# Patient Record
Sex: Male | Born: 1935
Health system: Southern US, Community
[De-identification: ages and names within clinical notes are randomized; demographics above are authoritative.]

## PROBLEM LIST (undated history)

## (undated) DIAGNOSIS — I4891 Unspecified atrial fibrillation: Secondary | ICD-10-CM

## (undated) DIAGNOSIS — E785 Hyperlipidemia, unspecified: Secondary | ICD-10-CM

## (undated) DIAGNOSIS — I10 Essential (primary) hypertension: Secondary | ICD-10-CM

## (undated) DIAGNOSIS — E119 Type 2 diabetes mellitus without complications: Secondary | ICD-10-CM

## (undated) DIAGNOSIS — N189 Chronic kidney disease, unspecified: Secondary | ICD-10-CM

## (undated) DIAGNOSIS — M1990 Unspecified osteoarthritis, unspecified site: Secondary | ICD-10-CM

## (undated) DIAGNOSIS — N400 Enlarged prostate without lower urinary tract symptoms: Secondary | ICD-10-CM

## (undated) HISTORY — PX: OTHER SURGICAL HISTORY: SHX169

## (undated) HISTORY — DX: Chronic kidney disease, unspecified: N18.9

## (undated) HISTORY — PX: KIDNEY SURGERY: SHX687

## (undated) HISTORY — DX: Benign prostatic hyperplasia without lower urinary tract symptoms: N40.0

## (undated) HISTORY — DX: Essential (primary) hypertension: I10

## (undated) HISTORY — DX: Hyperlipidemia, unspecified: E78.5

## (undated) HISTORY — DX: Type 2 diabetes mellitus without complications: E11.9

---

## 2004-12-02 ENCOUNTER — Ambulatory Visit: Payer: Self-pay | Admitting: Ophthalmology

## 2005-03-24 ENCOUNTER — Ambulatory Visit: Payer: Self-pay | Admitting: Ophthalmology

## 2011-11-29 ENCOUNTER — Ambulatory Visit: Payer: Self-pay | Admitting: Gastroenterology

## 2011-11-30 LAB — PATHOLOGY REPORT

## 2012-08-03 ENCOUNTER — Ambulatory Visit: Payer: Self-pay | Admitting: Family Medicine

## 2014-07-18 DIAGNOSIS — L723 Sebaceous cyst: Secondary | ICD-10-CM | POA: Diagnosis not present

## 2014-08-09 ENCOUNTER — Ambulatory Visit: Payer: Self-pay | Admitting: Surgery

## 2014-08-28 ENCOUNTER — Ambulatory Visit (INDEPENDENT_AMBULATORY_CARE_PROVIDER_SITE_OTHER): Payer: Medicare Other | Admitting: Surgery

## 2014-08-28 ENCOUNTER — Encounter: Payer: Self-pay | Admitting: Surgery

## 2014-08-28 VITALS — BP 146/64 | HR 66 | Temp 97.6°F | Ht 71.0 in | Wt 202.0 lb

## 2014-08-28 DIAGNOSIS — L723 Sebaceous cyst: Secondary | ICD-10-CM | POA: Diagnosis not present

## 2014-08-28 NOTE — Patient Instructions (Signed)
Please follow-up in our office in 1 week for nurse visit to remove sutures.

## 2014-08-28 NOTE — Progress Notes (Signed)
Patient ID: [name], male   DOB: 1934-04-26, 80 y.o.   MRN: [id number]  Chief Complaint  Patient presents with  . Other    Sebaceous Cyst-Mid Upper Back     Cody Howard is a 80 y.o. male.  Presents for excision of sebaceous cyst which has been incised and drained at least 5 different times over the last several months. HPI  No past medical history on file. neg medical history.  No past surgical history on file. no surgical history.  No family history on file. no cancer.  Social History History  Substance Use Topics  . Smoking status: Former Smoker    Quit date: 03/08/1978  . Smokeless tobacco: Not on file  . Alcohol Use: 0.0 oz/week    0 Standard drinks or equivalent per week     Comment: Rarely    No Known Allergies  Current Outpatient Prescriptions  Medication Sig Dispense Refill  . ACCU-CHEK AVIVA PLUS test strip Take 1 tablet by mouth daily.    . metFORMIN (GLUCOPHAGE) 500 MG tablet Take 1 tablet by mouth 2 (two) times daily.    . simvastatin (ZOCOR) 80 MG tablet Take 1 tablet by mouth daily.    . tamsulosin (FLOMAX) 0.4 MG CAPS capsule Take 1 capsule by mouth daily.     No current facility-administered medications for this visit.      Review of Systems A 10 point review of systems was asked and was negative except for the following positive findings.  Blood pressure 146/64, pulse 66, temperature 97.6 F (36.4 C), temperature source Oral, height 5\' 11"  (1.803 m), weight 202 lb (91.627 kg).  Physical Exam CONSTITUTIONAL:  Pleasant, well-developed, well-nourished, and in no acute distress. EYES: Pupils equal and reactive to light, Sclera non-icteric EARS, NOSE, MOUTH AND THROAT:  The oropharynx was clear.  Dentition is good repair.  Oral mucosa pink and moist. LYMPH NODES:  Lymph nodes in the neck and axillae were normal RESPIRATORY:  Lungs were clear.  Normal respiratory effort without pathologic use of accessory muscles of  respiration CARDIOVASCULAR: Heart was regular without murmurs.  There were no carotid bruits. GI: The abdomen was soft, nontender, and nondistended. There were no palpable masses. There was no hepatosplenomegaly. There were normal bowel sounds in all quadrants. GU:  Rectal deferred.   MUSCULOSKELETAL:  Normal muscle strength and tone.  No clubbing or cyanosis.   SKIN:  There is a less than 1 cm sebaceous cyst upper mid back no cellulitis. NEUROLOGIC:  Sensation is normal.  Cranial nerves are grossly intact. PSYCH:  Oriented to person, place and time.  Mood and affect are normal.   Assessment    Sebaceous cyst upper back, recurrent episodes of infection    Plan    Plan is for excision of this in the office today. Under sterile technique an informed consent a field block was created elliptical incision along the transverse axis was fashioned over the cyst. Cyst was excised in its entirety down to subcutaneous fat. The wound was then irrigated. This wound was closed in multiple layers of 3-0 Vicryls and 3-0 nylon and 4-0 nylon in the skin. Neosporin sterile dressing was applied patient tolerated procedure well without immediate consultation.  Follow-up in 1 week's time for suture removal.      Sherri Rad 08/28/2014, 2:54 PM

## 2014-09-11 ENCOUNTER — Ambulatory Visit: Payer: Self-pay

## 2014-09-12 ENCOUNTER — Ambulatory Visit: Payer: Medicare Other

## 2014-09-12 VITALS — BP 124/67 | HR 80 | Temp 97.3°F | Ht 70.0 in | Wt 205.6 lb

## 2014-09-12 DIAGNOSIS — Z4802 Encounter for removal of sutures: Secondary | ICD-10-CM

## 2014-09-12 NOTE — Progress Notes (Signed)
Patient seen in office for suture removal post Sebaceous Cyst removal. Patient denies pain, fever, and drainage. Slightly red around sutures.   Explained to patient that this is normal but if redness does not get better after sutures have been removed for 7-10 days or he develops purulent drainage, fever then he needs to get in touch with our office.  Pt verbalizes understanding.  Sutures were removed intact without difficulty.

## 2014-09-30 ENCOUNTER — Telehealth: Payer: Self-pay

## 2014-09-30 ENCOUNTER — Encounter (INDEPENDENT_AMBULATORY_CARE_PROVIDER_SITE_OTHER): Payer: Self-pay

## 2014-09-30 ENCOUNTER — Encounter: Payer: Self-pay | Admitting: Surgery

## 2014-09-30 ENCOUNTER — Other Ambulatory Visit: Payer: Self-pay | Admitting: Family Medicine

## 2014-09-30 ENCOUNTER — Ambulatory Visit (INDEPENDENT_AMBULATORY_CARE_PROVIDER_SITE_OTHER): Payer: Medicare Other | Admitting: Surgery

## 2014-09-30 VITALS — BP 140/69 | HR 76 | Temp 97.4°F | Ht 71.0 in | Wt 205.0 lb

## 2014-09-30 DIAGNOSIS — L723 Sebaceous cyst: Secondary | ICD-10-CM

## 2014-09-30 DIAGNOSIS — E119 Type 2 diabetes mellitus without complications: Secondary | ICD-10-CM

## 2014-09-30 NOTE — Patient Instructions (Signed)
Follow up as needed

## 2014-09-30 NOTE — Progress Notes (Signed)
Outpatient Surgical Follow Up  09/30/2014  Cody Howard is an 80 y.o. male. Who returns with complaints of drainage from his excisional site on his back for a sebaceous cyst.  Chief Complaint  Patient presents with  . Post-op Problem    surgery site still open and draining    HPI: He underwent a cyst excision several weeks ago and developed some wound drainage. He was seen in the office last week and asked follow-up today to rule out any evidence of infection.  Past Medical History  Diagnosis Date  . Diabetes mellitus without complication   . Chronic kidney disease   . Prostate enlargement   . Hyperlipidemia     Past Surgical History  Procedure Laterality Date  . Kidney surgery Right     surgery when 80 years old  . Sebaceous cyst removal Right     located right of spine on upper back    Family History  Problem Relation Age of Onset  . Diabetes Father   . Heart disease Father     Social History:  reports that he quit smoking about 36 years ago. He does not have any smokeless tobacco history on file. He reports that he drinks alcohol. He reports that he does not use illicit drugs.  Allergies: No Known Allergies  Medications reviewed.    ROS    BP 140/69 mmHg  Pulse 76  Temp(Src) 97.4 F (36.3 C) (Oral)  Ht 5\' 11"  (1.803 m)  Wt 205 lb (92.987 kg)  BMI 28.60 kg/m2  Physical Exam     No results found for this or any previous visit (from the past 48 hour(s)). No results found.  Assessment/Plan:  1. Sebaceous cyst I do not see any evidence of advancing infection or evidence for undrained infection. He clearly had a wound separation likely due to the tension on the incision. I would not recommend any further intervention at this time. We will continue to follow him as necessary. Patient and wife were present for the interview and in agreement with this plan.     Dia Crawford III  09/30/2014,negative

## 2014-09-30 NOTE — Telephone Encounter (Signed)
Patient walked in to office in [place] and wanted nurse to look at Cyst on back as he states that it is becoming more painful by the day.  This nurse looked at the Sebaceous cyst area that was excised recently by Dr. Marina Gravel.   The area is slightly red and appears to be filling once again. Area is not currently draining. Pt denies fever. Placed on schedule to see Dr. Pat Patrick in [place] on Monday.  Explained to patient that if over the weekend, the patient develops fever greater than 100.4, pain becomes severe, or he develops Nausea or vomiting, he would need to call Surgeon on call at hospital for further instructions. Number was given to hospital switchboard. Verbalizes understanding.

## 2014-11-27 ENCOUNTER — Other Ambulatory Visit: Payer: Self-pay | Admitting: Family Medicine

## 2014-11-27 DIAGNOSIS — E119 Type 2 diabetes mellitus without complications: Secondary | ICD-10-CM

## 2014-11-29 ENCOUNTER — Ambulatory Visit
Admission: EM | Admit: 2014-11-29 | Discharge: 2014-11-29 | Disposition: A | Discharge status: Home / Self Care | Payer: Medicare Other | Attending: Internal Medicine | Admitting: Internal Medicine

## 2014-11-29 ENCOUNTER — Encounter: Payer: Self-pay | Admitting: Emergency Medicine

## 2014-11-29 DIAGNOSIS — S61412A Laceration without foreign body of left hand, initial encounter: Secondary | ICD-10-CM | POA: Diagnosis not present

## 2014-11-29 MED ORDER — TETANUS-DIPHTH-ACELL PERTUSSIS 5-2.5-18.5 LF-MCG/0.5 IM SUSP
0.5000 mL | Freq: Once | INTRAMUSCULAR | Status: AC
  Administered 2014-11-29: 0.5 mL via INTRAMUSCULAR

## 2014-11-29 MED ORDER — LIDOCAINE HCL (PF) 1 % IJ SOLN
5.0000 mL | Freq: Once | INTRAMUSCULAR | Status: AC
  Administered 2014-11-29: 5 mL

## 2014-11-29 MED ORDER — BACITRACIN ZINC 500 UNIT/GM EX OINT
TOPICAL_OINTMENT | Freq: Once | CUTANEOUS | Status: AC
  Administered 2014-11-29: 18:00:00 via TOPICAL

## 2014-11-29 NOTE — ED Notes (Signed)
Wound dressed and  Aluminum splint applied to

## 2014-11-29 NOTE — ED Notes (Signed)
Pt with a laceration to left hand from metal stripping

## 2014-11-29 NOTE — ED Provider Notes (Signed)
[facility] Emergency Department Provider Note  ____________________________________________  Time seen: Approximately 4:55 PM  I have reviewed the triage vital signs and the nursing notes.   HISTORY  Chief Complaint Laceration   HPI Cody Howard is a 80 y.o. male presents for complaint of laceration. Patient reports that just prior to arrival he was at home he had cut a piece of thin aluminum to cover a gutter. Reports as he was installing a piece of aluminum over the gutter states that the piece of aluminum slipped and then cut his left hand. States that he did not hit his hand on anything. Denies blunt trauma. Denies pain at this time. Reports unsure of last tetanus immunization. Denies blood thinners. Denies tingling or numbness sensation. Denies fall or head injury.    Past Medical History  Diagnosis Date  . Diabetes mellitus without complication   . Chronic kidney disease   . Prostate enlargement   . Hyperlipidemia     Patient Active Problem List   Diagnosis Date Noted  . Sebaceous cyst 08/28/2014    Past Surgical History  Procedure Laterality Date  . Kidney surgery Right     surgery when 80 years old  . Sebaceous cyst removal Right     located right of spine on upper back    Current Outpatient Rx  Name  Route  Sig  Dispense  Refill  . ACCU-CHEK AVIVA PLUS test strip      TEST DAILY   50 each   0     DX- E11.9   . ACCU-CHEK SOFTCLIX LANCETS lancets      USE AS DIRECTED   100 each   1   . metFORMIN (GLUCOPHAGE) 500 MG tablet   Oral   Take 1 tablet by mouth 2 (two) times daily.         . simvastatin (ZOCOR) 80 MG tablet   Oral   Take 1 tablet by mouth daily.         . tamsulosin (FLOMAX) 0.4 MG CAPS capsule   Oral   Take 1 capsule by mouth daily.           Allergies Review of patient's allergies indicates no known allergies.  Family History  Problem Relation Age of Onset  . Diabetes Father   . Heart disease  Father     Social History Social History  Substance Use Topics  . Smoking status: Former Smoker    Quit date: 03/08/1978  . Smokeless tobacco: None  . Alcohol Use: 0.0 oz/week    0 Standard drinks or equivalent per week     Comment: Rarely    Review of Systems Constitutional: No fever/chills Eyes: No visual changes. ENT: No sore throat. Cardiovascular: Denies chest pain. Respiratory: Denies shortness of breath. Gastrointestinal: No abdominal pain.  No nausea, no vomiting.  No diarrhea.  No constipation. Genitourinary: Negative for dysuria. Musculoskeletal: Negative for back pain. Skin: Negative for rash.laceration as above  Neurological: Negative for headaches, focal weakness or numbness.  10-point ROS otherwise negative.  ____________________________________________   PHYSICAL EXAM:  VITAL SIGNS: ED Triage Vitals  Enc Vitals Group     BP 11/29/14 1652 169/71 mmHg     Pulse Rate 11/29/14 1652 74     Resp 11/29/14 1652 20     Temp 11/29/14 1652 98.6 F (37 C)     Temp Source 11/29/14 1652 Tympanic     SpO2 11/29/14 1652 99 %     Weight 11/29/14  1652 195 lb (88.451 kg)     Height 11/29/14 1652 5\' 11"  (1.803 m)     Head Cir --      Peak Flow --      Pain Score 11/29/14 1653 4     Pain Loc --      Pain Edu? --      Excl. in [place]? --     Constitutional: Alert and oriented. Well appearing and in no acute distress. Eyes: Conjunctivae are normal. PERRL. EOMI. Head: Atraumatic.  Nose: No congestion/rhinnorhea.  Mouth/Throat: Mucous membranes are moist.   Neck: No stridor.  No cervical spine tenderness to palpation. Cardiovascular: Normal rate, regular rhythm. Grossly normal heart sounds.  Good peripheral circulation. Respiratory: Normal respiratory effort.  No retractions. Lungs CTAB. Gastrointestinal: Soft and nontender. No distention. Normal Bowel sounds.   Musculoskeletal: No lower or upper extremity tenderness nor edema.  No joint effusions. Bilateral pedal  pulses equal and easily palpated.  See skin notes below. Left hand palmer aspect with lacerations present, nontender, no tendon deficit, no foreign bodies visualized, cap refil <2 secs to all fingers. Bilateral hand grips equal.  Neurologic:  Normal speech and language. No gross focal neurologic deficits are appreciated. No gait instability. Skin:  Skin is warm, dry and intact. No rash noted. Except: Left hand palmar aspect at the base of fifth MCP 3.5 cm laceration present no tendon visualized, full range of motion, no tendon deficit, sensation intact, nontender. Left hand palmer aspect base of 4th finger just below MCP, 1.5 cm laceration, no tendon deficit, sensation intact, no tendon visualized. Left palmar aspect distal thumb superficial 2 cm laceration present, full range of motion, no tendon deficit, sensation intact and nontender.No foreign bodies visualized.   Skin otherwise intact. Psychiatric: Mood and affect are normal. Speech and behavior are normal.  ____________________________________________   LABS (all labs ordered are listed, but only abnormal results are displayed)  Labs Reviewed - No data to display   PROCEDURES  Procedure(s) performed:  Procedure explained and verbal consent obtained. Consent: Verbal consent obtained. Written consent not obtained. Risks and benefits: risks, benefits and alternatives were discussed Patient identity confirmed: verbally with patient and hospital-assigned identification number  Consent given by: patient   Laceration Repair Location: left hand at base of 5th MCP palmer aspect Length: 3.5 cm Foreign bodies: no foreign bodies Tendon involvement: none Nerve involvement: none Preparation: Patient was prepped and draped in the usual sterile fashion. Anesthesia with 1% Lidocaine 3 mls Irrigation solution: saline Irrigation method: jet lavage Amount of cleaning: copious Repaired with 5-0 sutures Number of sutures: 6 Technique: simple  interrupted  Approximation: loose  Laceration Repair Location: left hand palmer aspect thumb Length: 2cm Foreign bodies: no foreign bodies Tendon involvement: none Nerve involvement: none Preparation: Patient was prepped and draped in the usual sterile fashion. Anesthesia with 1% Lidocaine 1 mls Irrigation solution: saline Irrigation method: jet lavage Amount of cleaning: copious Repaired with 5-0 nylon Number of sutures: 2 Technique: simple interrupted  Approximation: loose  Laceration Repair Location: left hand palmer base of 4th mcp Length: 1.5 cm Foreign bodies: no foreign bodies Tendon involvement: none Nerve involvement: none Preparation: Patient was prepped and draped in the usual sterile fashion. Anesthesia with 1% Lidocaine 1 mls Irrigation solution: saline Irrigation method: jet lavage Amount of cleaning: copious Repaired with 5-0 nylon Number of sutures: 2 Technique: simple interrupted  Approximation: loose Patient tolerate well. Wound well approximated post repair.  Antibiotic ointment and dressing applied.  Wound  care instructions provided.  Observe for any signs of infection or other problems.    Also finger splint applied over 4/5 fingers to limit flexion.   _________________________________   INITIAL IMPRESSION / ASSESSMENT AND PLAN / ED COURSE  Pertinent labs & imaging results that were available during my care of the patient were reviewed by me and considered in my medical decision making (see chart for details).   Well-appearing patient. No acute distress. Presents with multiple lacerations to left palmar aspect of hand. Lacerations repaired. Patient tolerated well. No foreign body seen. Tetanus immunization updated. Discussed with patient to return in 2 days for wound check. Return 7-10 days for suture removal.  Discussed cleaning at home.Discussed follow up with Primary care physician this week. Discussed follow up and return parameters including no  resolution or any worsening concerns. Patient verbalized understanding and agreed to plan.   ____________________________________________   FINAL CLINICAL IMPRESSION(S) / ED DIAGNOSES  Final diagnoses:  Hand laceration, left, initial encounter       Marylene Land, NP 11/29/14 1758  Marylene Land, NP 11/29/14 1759

## 2014-11-29 NOTE — Discharge Instructions (Signed)
Keep area clean and dry. Clean daily with soap and water, rinse, pat dry, then apply thin layer of topical antibiotic. Allow some open to air time daily when in clean environment. Wear splint for 2-3 days to allow for healing.   Return in 2 days for wound check as discussed.   Return in 7-10 days for suture removal. Return sooner for redness, drainage, swelling, pain, new or worsening concerns.   Laceration Care, Adult A laceration is a cut that goes through all layers of the skin. The cut goes into the tissue beneath the skin. HOME CARE For stitches (sutures) or staples:  Keep the cut clean and dry.  If you have a bandage (dressing), change it at least once a day. Change the bandage if it gets wet or dirty, or as told by your doctor.  Wash the cut with soap and water 2 times a day. Rinse the cut with water. Pat it dry with a clean towel.  Put a thin layer of medicated cream on the cut as told by your doctor.  You may shower after the first 24 hours. Do not soak the cut in water until the stitches are removed.  Only take medicines as told by your doctor.  Have your stitches or staples removed as told by your doctor. For skin adhesive strips:  Keep the cut clean and dry.  Do not get the strips wet. You may take a bath, but be careful to keep the cut dry.  If the cut gets wet, pat it dry with a clean towel.  The strips will fall off on their own. Do not remove the strips that are still stuck to the cut. For wound glue:  You may shower or take baths. Do not soak or scrub the cut. Do not swim. Avoid heavy sweating until the glue falls off on its own. After a shower or bath, pat the cut dry with a clean towel.  Do not put medicine on your cut until the glue falls off.  If you have a bandage, do not put tape over the glue.  Avoid lots of sunlight or tanning lamps until the glue falls off. Put sunscreen on the cut for the first year to reduce your scar.  The glue will fall off on  its own. Do not pick at the glue. You may need a tetanus shot if:  You cannot remember when you had your last tetanus shot.  You have never had a tetanus shot. If you need a tetanus shot and you choose not to have one, you may get tetanus. Sickness from tetanus can be serious. GET HELP RIGHT AWAY IF:   Your pain does not get better with medicine.  Your arm, hand, leg, or foot loses feeling (numbness) or changes color.  Your cut is bleeding.  Your joint feels weak, or you cannot use your joint.  You have painful lumps on your body.  Your cut is red, puffy (swollen), or painful.  You have a red line on the skin near the cut.  You have yellowish-white fluid (pus) coming from the cut.  You have a fever.  You have a bad smell coming from the cut or bandage.  Your cut breaks open before or after stitches are removed.  You notice something coming out of the cut, such as wood or glass.  You cannot move a finger or toe. MAKE SURE YOU:   Understand these instructions.  Will watch your condition.  Will get help  right away if you are not doing well or get worse. Document Released: 08/11/2007 Document Revised: 05/17/2011 Document Reviewed: 08/18/2010 [facility] Patient Information 2015 [place], [place]. This information is not intended to replace advice given to you by your health care provider. Make sure you discuss any questions you have with your health care provider.  Laceration Care, Adult A laceration is a cut or lesion that goes through all layers of the skin and into the tissue just beneath the skin. TREATMENT  Some lacerations may not require closure. Some lacerations may not be able to be closed due to an increased risk of infection. It is important to see your caregiver as soon as possible after an injury to minimize the risk of infection and maximize the opportunity for successful closure. If closure is appropriate, pain medicines may be given, if needed. The wound will be  cleaned to help prevent infection. Your caregiver will use stitches (sutures), staples, wound glue (adhesive), or skin adhesive strips to repair the laceration. These tools bring the skin edges together to allow for faster healing and a better cosmetic outcome. However, all wounds will heal with a scar. Once the wound has healed, scarring can be minimized by covering the wound with sunscreen during the day for 1 full year. HOME CARE INSTRUCTIONS  For sutures or staples:  Keep the wound clean and dry.  If you were given a bandage (dressing), you should change it at least once a day. Also, change the dressing if it becomes wet or dirty, or as directed by your caregiver.  Wash the wound with soap and water 2 times a day. Rinse the wound off with water to remove all soap. Pat the wound dry with a clean towel.  After cleaning, apply a thin layer of the antibiotic ointment as recommended by your caregiver. This will help prevent infection and keep the dressing from sticking.  You may shower as usual after the first 24 hours. Do not soak the wound in water until the sutures are removed.  Only take over-the-counter or prescription medicines for pain, discomfort, or fever as directed by your caregiver.  Get your sutures or staples removed as directed by your caregiver. For skin adhesive strips:  Keep the wound clean and dry.  Do not get the skin adhesive strips wet. You may bathe carefully, using caution to keep the wound dry.  If the wound gets wet, pat it dry with a clean towel.  Skin adhesive strips will fall off on their own. You may trim the strips as the wound heals. Do not remove skin adhesive strips that are still stuck to the wound. They will fall off in time. For wound adhesive:  You may briefly wet your wound in the shower or bath. Do not soak or scrub the wound. Do not swim. Avoid periods of heavy perspiration until the skin adhesive has fallen off on its own. After showering or  bathing, gently pat the wound dry with a clean towel.  Do not apply liquid medicine, cream medicine, or ointment medicine to your wound while the skin adhesive is in place. This may loosen the film before your wound is healed.  If a dressing is placed over the wound, be careful not to apply tape directly over the skin adhesive. This may cause the adhesive to be pulled off before the wound is healed.  Avoid prolonged exposure to sunlight or tanning lamps while the skin adhesive is in place. Exposure to ultraviolet light in the first  year will darken the scar.  The skin adhesive will usually remain in place for 5 to 10 days, then naturally fall off the skin. Do not pick at the adhesive film. You may need a tetanus shot if:  You cannot remember when you had your last tetanus shot.  You have never had a tetanus shot. If you get a tetanus shot, your arm may swell, get red, and feel warm to the touch. This is common and not a problem. If you need a tetanus shot and you choose not to have one, there is a rare chance of getting tetanus. Sickness from tetanus can be serious. SEEK MEDICAL CARE IF:   You have redness, swelling, or increasing pain in the wound.  You see a red line that goes away from the wound.  You have yellowish-white fluid (pus) coming from the wound.  You have a fever.  You notice a bad smell coming from the wound or dressing.  Your wound breaks open before or after sutures have been removed.  You notice something coming out of the wound such as wood or glass.  Your wound is on your hand or foot and you cannot move a finger or toe. SEEK IMMEDIATE MEDICAL CARE IF:   Your pain is not controlled with prescribed medicine.  You have severe swelling around the wound causing pain and numbness or a change in color in your arm, hand, leg, or foot.  Your wound splits open and starts bleeding.  You have worsening numbness, weakness, or loss of function of any joint around or  beyond the wound.  You develop painful lumps near the wound or on the skin anywhere on your body. MAKE SURE YOU:   Understand these instructions.  Will watch your condition.  Will get help right away if you are not doing well or get worse. Document Released: 02/22/2005 Document Revised: 05/17/2011 Document Reviewed: 08/18/2010 [facility] Patient Information 2015 [place], [place]. This information is not intended to replace advice given to you by your health care provider. Make sure you discuss any questions you have with your health care provider.

## 2014-12-01 ENCOUNTER — Ambulatory Visit
Admission: EM | Admit: 2014-12-01 | Discharge: 2014-12-01 | Disposition: A | Discharge status: Home / Self Care | Payer: Medicare Other | Attending: Internal Medicine | Admitting: Internal Medicine

## 2014-12-01 ENCOUNTER — Encounter: Payer: Self-pay | Admitting: *Deleted

## 2014-12-01 DIAGNOSIS — Z4801 Encounter for change or removal of surgical wound dressing: Secondary | ICD-10-CM

## 2014-12-01 DIAGNOSIS — IMO0002 Reserved for concepts with insufficient information to code with codable children: Secondary | ICD-10-CM

## 2014-12-01 NOTE — ED Notes (Signed)
Pt is here today for a follow up on injury to left hand, sutures were put in on Friday.

## 2014-12-01 NOTE — Discharge Instructions (Signed)
Keep clean. Clean daily with soap and water, rinse, pat dry, then apply thin layer topical antibiotic ointment such as neosporin. Allow some open to air time daily.   Return in 7 days for suture removal. Return sooner for redness, swelling, pain, new or worsening concerns.

## 2014-12-01 NOTE — ED Provider Notes (Signed)
[facility] Emergency Department Provider Note  ____________________________________________  Time seen: Approximately 9:27 AM  I have reviewed the triage vital signs and the nursing notes.   HISTORY  Chief Complaint Follow-up   HPI Cody Howard is a 80 y.o. male presents today for the complaint of wound check. Patient was seen 2 days ago at urgent care for laceration repair. Presents today for recheck of laceration. Patient denies pain. Reports occasional tenderness with movement but denies any at this time. Denies redness, drainage, numbness, tendon sensation or decrease hand grips.   Past Medical History  Diagnosis Date  . Diabetes mellitus without complication   . Chronic kidney disease   . Prostate enlargement   . Hyperlipidemia     Patient Active Problem List   Diagnosis Date Noted  . Sebaceous cyst 08/28/2014    Past Surgical History  Procedure Laterality Date  . Kidney surgery Right     surgery when 80 years old  . Sebaceous cyst removal Right     located right of spine on upper back    Current Outpatient Rx  Name  Route  Sig  Dispense  Refill  . ACCU-CHEK AVIVA PLUS test strip      TEST DAILY   50 each   0     DX- E11.9   . ACCU-CHEK SOFTCLIX LANCETS lancets      USE AS DIRECTED   100 each   1   . metFORMIN (GLUCOPHAGE) 500 MG tablet   Oral   Take 1 tablet by mouth 2 (two) times daily.         . simvastatin (ZOCOR) 80 MG tablet   Oral   Take 1 tablet by mouth daily.         . tamsulosin (FLOMAX) 0.4 MG CAPS capsule   Oral   Take 1 capsule by mouth daily.           Allergies Review of patient's allergies indicates no known allergies.  Family History  Problem Relation Age of Onset  . Diabetes Father   . Heart disease Father     Social History Social History  Substance Use Topics  . Smoking status: Former Smoker    Quit date: 03/08/1978  . Smokeless tobacco: None  . Alcohol Use: 0.0 oz/week    0  Standard drinks or equivalent per week     Comment: Rarely   Review of Systems Constitutional: No fever/chills Eyes: No visual changes. ENT: No sore throat. Cardiovascular: Denies chest pain. Respiratory: Denies shortness of breath. Gastrointestinal: No abdominal pain.  No nausea, no vomiting.  No diarrhea.  No constipation. Genitourinary: Negative for dysuria. Musculoskeletal: Negative for back pain. Skin: Negative for rash.laceration left hand.  Neurological: Negative for headaches, focal weakness or numbness.  10-point ROS otherwise negative.  ____________________________________________   PHYSICAL EXAM:  VITAL SIGNS: ED Triage Vitals  Enc Vitals Group     BP 12/01/14 0851 146/59 mmHg     Pulse Rate 12/01/14 0851 63     Resp -- 18     Temp 12/01/14 0851 97.8 F (36.6 C)     Temp Source 12/01/14 0851 Oral     SpO2 12/01/14 0851 98 %     Weight 12/01/14 0851 195 lb (88.451 kg)     Height 12/01/14 0851 5\' 11"  (1.803 m)     Head Cir --      Peak Flow --      Pain Score 12/01/14 0855 3  Pain Loc --      Pain Edu? --      Excl. in [place]? --     Constitutional: Alert and oriented. Well appearing and in no acute distress. Eyes: Conjunctivae are normal. PERRL. EOMI. Head: Atraumatic.  Nose: No congestion/rhinnorhea.  Mouth/Throat: Mucous membranes are moist.   Cardiovascular: Normal rate, regular rhythm. Grossly normal heart sounds.  Good peripheral circulation. Respiratory: Normal respiratory effort.  No retractions. Lungs CTAB. Gastrointestinal: Soft and nontender. No distention. Normal Bowel sounds.  Musculoskeletal: No lower or upper extremity tenderness nor edema.  Neurologic:  Normal speech and language. No gross focal neurologic deficits are appreciated. No gait instability. Skin:  Skin is warm, dry and intact. No rash noted. Except: Left palmer aspect base of 4 mcp 2 sutures present, base of 5th mcp 6 sutures present and distal thumb 2 sutures present. Wounds  well approximated, no erythema , no drainage, full ROM, cap refill <2 secs, no tendon or motor deficit.  Psychiatric: Mood and affect are normal. Speech and behavior are normal.  _________________________________________   INITIAL IMPRESSION / ASSESSMENT AND PLAN / ED COURSE  Pertinent labs & imaging results that were available during my care of the patient were reviewed by me and considered in my medical decision making (see chart for details).  Patient presents for laceration recheck. The wound is well healed without signs of infection. Wound care and activity instructions given. Wound cleaned and irrigated with saline and betadine. Antibiotic ointment and dressing applied.Counseled clean and dress daily. Return in 7 days for suture removal. Discussed follow up with Primary care physician this week. Discussed follow up and return parameters including no resolution or any worsening concerns. Patient verbalized understanding and agreed to plan.    ____________________________________________   FINAL CLINICAL IMPRESSION(S) / ED DIAGNOSES  Final diagnoses:  Laceration re-check       Marylene Land, NP 12/01/14 [id number]  Marylene Land, NP 12/01/14 1032

## 2014-12-05 ENCOUNTER — Other Ambulatory Visit: Payer: Self-pay

## 2014-12-08 ENCOUNTER — Ambulatory Visit
Admission: EM | Admit: 2014-12-08 | Discharge: 2014-12-08 | Disposition: A | Discharge status: Home / Self Care | Payer: Medicare Other

## 2014-12-08 NOTE — ED Notes (Signed)
Suture removal of left pinky and thumb.  Edges well approximated, no redness or drainage noted.  Able to bend pinky and thumb without difficulty.

## 2014-12-10 ENCOUNTER — Other Ambulatory Visit: Payer: Self-pay | Admitting: Family Medicine

## 2015-02-10 ENCOUNTER — Ambulatory Visit (INDEPENDENT_AMBULATORY_CARE_PROVIDER_SITE_OTHER): Payer: Medicare Other | Admitting: Family Medicine

## 2015-02-10 ENCOUNTER — Encounter: Payer: Self-pay | Admitting: Family Medicine

## 2015-02-10 VITALS — BP 122/60 | HR 54 | Ht 70.0 in | Wt 202.0 lb

## 2015-02-10 DIAGNOSIS — R1310 Dysphagia, unspecified: Secondary | ICD-10-CM | POA: Diagnosis not present

## 2015-02-10 DIAGNOSIS — E1129 Type 2 diabetes mellitus with other diabetic kidney complication: Secondary | ICD-10-CM | POA: Insufficient documentation

## 2015-02-10 DIAGNOSIS — Z0000 Encounter for general adult medical examination without abnormal findings: Secondary | ICD-10-CM | POA: Diagnosis not present

## 2015-02-10 DIAGNOSIS — Z87438 Personal history of other diseases of male genital organs: Secondary | ICD-10-CM

## 2015-02-10 DIAGNOSIS — E119 Type 2 diabetes mellitus without complications: Secondary | ICD-10-CM | POA: Diagnosis not present

## 2015-02-10 DIAGNOSIS — E785 Hyperlipidemia, unspecified: Secondary | ICD-10-CM

## 2015-02-10 LAB — HEMOCCULT GUIAC POC 1CARD (OFFICE): FECAL OCCULT BLD: NEGATIVE

## 2015-02-10 NOTE — Progress Notes (Signed)
Patient: Cody Howard, Male    DOB: 02-Sep-1934, 80 y.o.   MRN: [id number] Visit Date: 02/10/2015  Today's Provider: Otilio Miu, MD   Chief Complaint  Patient presents with  . Annual Exam    medicare annual wellness  . Hand Pain    arthritis in index finger of R) hand   Subjective:   Initial preventative physical exam Cody Howard is a 80 y.o. male who presents today for his Initial Preventative Physical Exam. He feels well. He reports exercising regular. He reports he is sleeping well.  Hyperlipidemia This is a chronic problem. The current episode started more than 1 year ago. The problem is controlled. Recent lipid tests were reviewed and are normal. Exacerbating diseases include diabetes and obesity. He has no history of chronic renal disease. There are no known factors aggravating his hyperlipidemia. Pertinent negatives include no chest pain, focal sensory loss, focal weakness, leg pain, myalgias or shortness of breath. He is currently on no antihyperlipidemic treatment. The current treatment provides mild improvement of lipids. There are no compliance problems.  There are no known risk factors for coronary artery disease.  Diabetes He presents for his follow-up diabetic visit. He has type 2 diabetes mellitus. There are no hypoglycemic associated symptoms. Pertinent negatives for hypoglycemia include no confusion, dizziness, headaches, nervousness/anxiousness, seizures, speech difficulty or tremors. Pertinent negatives for diabetes include no chest pain, no fatigue, no polydipsia, no polyphagia, no polyuria and no weakness. There are no hypoglycemic complications. Symptoms are stable. There are no diabetic complications. Current diabetic treatment includes oral agent (monotherapy). He is compliant with treatment all of the time.    Review of Systems  Constitutional: Negative for fever, chills, appetite change, fatigue and unexpected weight change.  HENT: Negative for ear pain,  facial swelling, hearing loss, nosebleeds, sneezing, sore throat and trouble swallowing.   Eyes: Negative for photophobia, pain, discharge, redness, itching and visual disturbance.  Respiratory: Negative for cough, choking, chest tightness, shortness of breath and wheezing.   Cardiovascular: Negative for chest pain, palpitations and leg swelling.  Gastrointestinal: Negative for nausea, vomiting, abdominal pain, diarrhea, constipation, blood in stool and rectal pain.       Dysphagia  Endocrine: Negative for cold intolerance, heat intolerance, polydipsia, polyphagia and polyuria.  Genitourinary: Negative for dysuria, urgency, frequency, hematuria, flank pain, decreased urine volume, discharge, penile swelling, scrotal swelling, difficulty urinating, penile pain and testicular pain.  Musculoskeletal: Negative for myalgias, back pain, joint swelling, neck pain and neck stiffness.  Skin: Negative for color change and rash.  Allergic/Immunologic: Negative for immunocompromised state.  Neurological: Negative for dizziness, tremors, focal weakness, seizures, syncope, speech difficulty, weakness, light-headedness, numbness and headaches.  Hematological: Bruises/bleeds easily.  Psychiatric/Behavioral: Negative for suicidal ideas, hallucinations, behavioral problems, confusion, self-injury, dysphoric mood and agitation. The patient is not nervous/anxious.     Social History   Social History  . Marital Status: Married    Spouse Name: N/A  . Number of Children: N/A  . Years of Education: N/A   Occupational History  . Not on file.   Social History Main Topics  . Smoking status: Former Smoker    Quit date: 03/08/1978  . Smokeless tobacco: Not on file  . Alcohol Use: 0.0 oz/week    0 Standard drinks or equivalent per week     Comment: Rarely  . Drug Use: No  . Sexual Activity: Not Currently   Other Topics Concern  . Not on file   Social History Narrative  Patient Active Problem List    Diagnosis Date Noted  . Type 2 diabetes mellitus without complication, without long-term current use of insulin ([place]) 02/10/2015  . History of BPH 02/10/2015  . Sebaceous cyst 08/28/2014    Past Surgical History  Procedure Laterality Date  . Kidney surgery Right     surgery when 80 years old  . Sebaceous cyst removal Right     located right of spine on upper back    His family history includes Diabetes in his father; Heart disease in his father.    Previous Medications   ACCU-CHEK AVIVA PLUS TEST STRIP    TEST DAILY   ACCU-CHEK SOFTCLIX LANCETS LANCETS    USE AS DIRECTED   METFORMIN (GLUCOPHAGE) 500 MG TABLET    Take 1 tablet by mouth two  times daily   OMEGA-3 FATTY ACIDS (FISH OIL) 1000 MG CAPS    Take 1 capsule by mouth daily.   SIMVASTATIN (ZOCOR) 80 MG TABLET    Take 1 tablet by mouth  every day   TAMSULOSIN (FLOMAX) 0.4 MG CAPS CAPSULE    Take 1 capsule by mouth  every day    Patient Care Team: [name], MD as PCP - General (Family Medicine)     Objective:   Vitals: BP 122/60 mmHg  Pulse 54  Ht 5\' 10"  (1.778 m)  Wt 202 lb (91.627 kg)  BMI 28.98 kg/m2  Physical Exam  Constitutional: He is oriented to person, place, and time. He appears well-developed and well-nourished.  HENT:  Head: Normocephalic.  Right Ear: External ear normal.  Left Ear: External ear normal.  Nose: Nose normal.  Mouth/Throat: Oropharynx is clear and moist.  Eyes: Conjunctivae and EOM are normal. Pupils are equal, round, and reactive to light.  Neck: Normal range of motion. Neck supple.  Cardiovascular: Normal rate, regular rhythm, normal heart sounds and intact distal pulses.   Pulmonary/Chest: Effort normal and breath sounds normal.  Abdominal: Soft. Bowel sounds are normal.  Genitourinary: Rectum normal, prostate normal, testes normal and penis normal. Guaiac negative stool.  Musculoskeletal: Normal range of motion.  Neurological: He is alert and oriented to person, place, and time.  He has normal reflexes.  Skin: Skin is warm and dry.  Psychiatric: He has a normal mood and affect. His behavior is normal. Judgment and thought content normal.     No exam data present  Activities of Daily Living In your present state of health, do you have any difficulty performing the following activities: 02/10/2015  Hearing? N  Vision? N  Difficulty concentrating or making decisions? N  Walking or climbing stairs? N  Dressing or bathing? N  Doing errands, shopping? N    Fall Risk Assessment Fall Risk  02/10/2015  Falls in the past year? No     Patient reports there are not safety devices in place in shower at home.   Depression Screen PHQ 2/9 Scores 02/10/2015  PHQ - 2 Score 1    Cognitive Testing - 6-CIT   Correct? Score   What year is it? yes 0 Yes = 0    No = 4  What month is it? yes 0 Yes = 0    No = 3  Remember:     Pia [address]au, [place], [place]     What time is it? yes 0 Yes = 0    No = 3  Count backwards from 20 to 1 yes 0 Correct = 0  1 error = 2   More than 1 error = 4  Say the months of the year in reverse. yes 0 Correct = 0    1 error = 2   More than 1 error = 4  What address did I ask you to remember? yes 2 Correct = 0  1 error = 2    2 error = 4    3 error = 6    4 error = 8    All wrong = 10       TOTAL SCORE  2/28   Interpretation:  Normal  Normal (0-7) Abnormal (8-28)     Assessment & Plan:     Initial Preventative Physical Exam  Reviewed patient's Family Medical History Reviewed and updated list of patient's medical providers Assessment of cognitive impairment was done Assessed patient's functional ability Established a written schedule for health screening [place] Completed and Reviewed  Exercise Activities and Dietary recommendations Goals    None      Immunization History  Administered Date(s) Administered  . Pneumococcal Polysaccharide-23 07/31/2010  . Tdap 11/29/2014    Health Maintenance   Topic Date Due  . HEMOGLOBIN A1C  [birth year]  . FOOT EXAM  01/23/1947  . OPHTHALMOLOGY EXAM  01/23/1947  . URINE MICROALBUMIN  01/23/1947  . ZOSTAVAX  01/22/1997  . PNA vac Low Risk Adult (1 of 2 - PCV13) 01/22/2002  . INFLUENZA VACCINE  10/07/2014  . TETANUS/TDAP  11/28/2024   influenza    Discussed health benefits of physical activity, and encouraged him to engage in regular exercise appropriate for his age and condition.    ------------------------------------------------------------------------------------------------------------   Problem List Items Addressed This Visit      Endocrine   Type 2 diabetes mellitus without complication, without long-term current use of insulin ([place])   Relevant Orders   HgB A1c   Renal Function Panel     Other   History of BPH   Relevant Orders   PSA    Other Visit Diagnoses    Medicare annual wellness visit, subsequent    -  Primary    Relevant Orders    POCT Occult Blood Stool (Completed)    Hyperlipidemia        Relevant Orders    Lipid Profile    Dysphagia        Relevant Orders    Ambulatory referral to Gastroenterology        Otilio Miu, MD [facility]  02/10/2015

## 2015-02-11 LAB — RENAL FUNCTION PANEL
ALBUMIN: 4.3 g/dL (ref 3.5–4.8)
BUN/Creatinine Ratio: 15 (ref 10–22)
BUN: 21 mg/dL (ref 8–27)
CHLORIDE: 103 mmol/L (ref 97–106)
CO2: 21 mmol/L (ref 18–29)
Calcium: 9.2 mg/dL (ref 8.6–10.2)
Creatinine, Ser: 1.43 mg/dL — ABNORMAL HIGH (ref 0.76–1.27)
GFR calc non Af Amer: 47 mL/min/{1.73_m2} — ABNORMAL LOW (ref >59)
GFR, EST AFRICAN AMERICAN: 54 mL/min/{1.73_m2} — AB (ref >59)
GLUCOSE: 147 mg/dL — AB (ref 65–99)
POTASSIUM: 5 mmol/L (ref 3.5–5.2)
Phosphorus: 2.6 mg/dL (ref 2.5–4.5)
Sodium: 143 mmol/L (ref 136–144)

## 2015-02-11 LAB — LIPID PANEL
Chol/HDL Ratio: 2.8 ratio units (ref 0.0–5.0)
Cholesterol, Total: 152 mg/dL (ref 100–199)
HDL: 55 mg/dL (ref >39)
LDL Calculated: 70 mg/dL (ref 0–99)
Triglycerides: 135 mg/dL (ref 0–149)
VLDL Cholesterol Cal: 27 mg/dL (ref 5–40)

## 2015-02-11 LAB — HEMOGLOBIN A1C
Est. average glucose Bld gHb Est-mCnc: 166 mg/dL
Hgb A1c MFr Bld: 7.4 % — ABNORMAL HIGH (ref 4.8–5.6)

## 2015-02-11 LAB — PSA: PROSTATE SPECIFIC AG, SERUM: 0.4 ng/mL (ref 0.0–4.0)

## 2015-02-13 DIAGNOSIS — R195 Other fecal abnormalities: Secondary | ICD-10-CM | POA: Diagnosis not present

## 2015-02-13 DIAGNOSIS — R1310 Dysphagia, unspecified: Secondary | ICD-10-CM | POA: Diagnosis not present

## 2015-02-20 ENCOUNTER — Encounter: Payer: Self-pay | Admitting: *Deleted

## 2015-02-25 NOTE — Discharge Instructions (Signed)

## 2015-02-26 ENCOUNTER — Ambulatory Visit: Payer: Medicare Other | Admitting: Anesthesiology

## 2015-02-26 ENCOUNTER — Encounter
Admission: RE | Disposition: A | Discharge status: Home / Self Care | Payer: Self-pay | Source: Ambulatory Visit | Attending: Gastroenterology

## 2015-02-26 ENCOUNTER — Ambulatory Visit
Admission: RE | Admit: 2015-02-26 | Discharge: 2015-02-26 | Disposition: A | Discharge status: Home / Self Care | Payer: Medicare Other | Source: Ambulatory Visit | Attending: Gastroenterology | Admitting: Gastroenterology

## 2015-02-26 DIAGNOSIS — E7800 Pure hypercholesterolemia, unspecified: Secondary | ICD-10-CM | POA: Insufficient documentation

## 2015-02-26 DIAGNOSIS — F458 Other somatoform disorders: Secondary | ICD-10-CM | POA: Diagnosis not present

## 2015-02-26 DIAGNOSIS — K5730 Diverticulosis of large intestine without perforation or abscess without bleeding: Secondary | ICD-10-CM | POA: Insufficient documentation

## 2015-02-26 DIAGNOSIS — D125 Benign neoplasm of sigmoid colon: Secondary | ICD-10-CM | POA: Insufficient documentation

## 2015-02-26 DIAGNOSIS — K635 Polyp of colon: Secondary | ICD-10-CM | POA: Insufficient documentation

## 2015-02-26 DIAGNOSIS — D122 Benign neoplasm of ascending colon: Secondary | ICD-10-CM | POA: Diagnosis not present

## 2015-02-26 DIAGNOSIS — N189 Chronic kidney disease, unspecified: Secondary | ICD-10-CM | POA: Diagnosis not present

## 2015-02-26 DIAGNOSIS — Z7984 Long term (current) use of oral hypoglycemic drugs: Secondary | ICD-10-CM | POA: Diagnosis not present

## 2015-02-26 DIAGNOSIS — Z86010 Personal history of colonic polyps: Secondary | ICD-10-CM | POA: Diagnosis not present

## 2015-02-26 DIAGNOSIS — R1310 Dysphagia, unspecified: Secondary | ICD-10-CM | POA: Diagnosis not present

## 2015-02-26 DIAGNOSIS — Z87891 Personal history of nicotine dependence: Secondary | ICD-10-CM | POA: Insufficient documentation

## 2015-02-26 DIAGNOSIS — Z800 Family history of malignant neoplasm of digestive organs: Secondary | ICD-10-CM | POA: Insufficient documentation

## 2015-02-26 DIAGNOSIS — Z79899 Other long term (current) drug therapy: Secondary | ICD-10-CM | POA: Insufficient documentation

## 2015-02-26 DIAGNOSIS — E118 Type 2 diabetes mellitus with unspecified complications: Secondary | ICD-10-CM | POA: Insufficient documentation

## 2015-02-26 DIAGNOSIS — K921 Melena: Secondary | ICD-10-CM | POA: Diagnosis not present

## 2015-02-26 DIAGNOSIS — R195 Other fecal abnormalities: Secondary | ICD-10-CM | POA: Diagnosis not present

## 2015-02-26 DIAGNOSIS — D123 Benign neoplasm of transverse colon: Secondary | ICD-10-CM | POA: Diagnosis not present

## 2015-02-26 HISTORY — DX: Unspecified osteoarthritis, unspecified site: M19.90

## 2015-02-26 HISTORY — PX: COLONOSCOPY: SHX5424

## 2015-02-26 HISTORY — PX: POLYPECTOMY: SHX5525

## 2015-02-26 HISTORY — PX: ESOPHAGOGASTRODUODENOSCOPY: SHX5428

## 2015-02-26 LAB — GLUCOSE, CAPILLARY
Glucose-Capillary: 146 mg/dL — ABNORMAL HIGH (ref 65–99)
Glucose-Capillary: 133 mg/dL — ABNORMAL HIGH (ref 65–99)

## 2015-02-26 SURGERY — COLONOSCOPY
Anesthesia: Monitor Anesthesia Care | Wound class: Contaminated

## 2015-02-26 MED ORDER — OXYCODONE HCL 5 MG/5ML PO SOLN: 5.0000 mg | Freq: Once | ORAL | Status: DC | PRN

## 2015-02-26 MED ORDER — SODIUM CHLORIDE 0.9 % IV SOLN: INTRAVENOUS | Status: DC

## 2015-02-26 MED ORDER — LIDOCAINE HCL (CARDIAC) 20 MG/ML IV SOLN
INTRAVENOUS | Status: DC | PRN
  Administered 2015-02-26: 50 mg via INTRAVENOUS

## 2015-02-26 MED ORDER — DEXAMETHASONE SODIUM PHOSPHATE 4 MG/ML IJ SOLN: 8.0000 mg | Freq: Once | INTRAMUSCULAR | Status: DC | PRN

## 2015-02-26 MED ORDER — FENTANYL CITRATE (PF) 100 MCG/2ML IJ SOLN: 25.0000 ug | INTRAMUSCULAR | Status: DC | PRN

## 2015-02-26 MED ORDER — ACETAMINOPHEN 325 MG PO TABS: 325.0000 mg | ORAL_TABLET | ORAL | Status: DC | PRN

## 2015-02-26 MED ORDER — LACTATED RINGERS IV SOLN
INTRAVENOUS | Status: DC
  Administered 2015-02-26: 08:00:00 via INTRAVENOUS

## 2015-02-26 MED ORDER — PROPOFOL 10 MG/ML IV BOLUS
INTRAVENOUS | Status: DC | PRN
  Administered 2015-02-26 (×2): 20 mg via INTRAVENOUS
  Administered 2015-02-26: 40 mg via INTRAVENOUS
  Administered 2015-02-26 (×5): 20 mg via INTRAVENOUS
  Administered 2015-02-26: 40 mg via INTRAVENOUS
  Administered 2015-02-26: 120 mg via INTRAVENOUS

## 2015-02-26 MED ORDER — OXYCODONE HCL 5 MG PO TABS: 5.0000 mg | ORAL_TABLET | Freq: Once | ORAL | Status: DC | PRN

## 2015-02-26 MED ORDER — ACETAMINOPHEN 160 MG/5ML PO SOLN: 325.0000 mg | ORAL | Status: DC | PRN

## 2015-02-26 MED ORDER — GLYCOPYRROLATE 0.2 MG/ML IJ SOLN
INTRAMUSCULAR | Status: DC | PRN
  Administered 2015-02-26: 0.1 mg via INTRAVENOUS

## 2015-02-26 MED ORDER — STERILE WATER FOR IRRIGATION IR SOLN
Status: DC | PRN
  Administered 2015-02-26: 09:00:00

## 2015-02-26 MED ORDER — LACTATED RINGERS IV SOLN: 500.0000 mL | INTRAVENOUS | Status: DC

## 2015-02-26 SURGICAL SUPPLY — 41 items
BALLN DILATOR 10-12 8 (BALLOONS)
BALLN DILATOR 12-15 8 (BALLOONS)
BALLN DILATOR 15-18 8 (BALLOONS)
BALLN DILATOR CRE 0-12 8 (BALLOONS)
BALLN DILATOR ESOPH 8 10 CRE (MISCELLANEOUS) IMPLANT
BALLOON DILATOR 12-15 8 (BALLOONS) IMPLANT
BALLOON DILATOR 15-18 8 (BALLOONS) IMPLANT
BALLOON DILATOR CRE 0-12 8 (BALLOONS) IMPLANT
BLOCK BITE 60FR ADLT L/F GRN (MISCELLANEOUS) ×3 IMPLANT
CANISTER SUCT 1200ML W/VALVE (MISCELLANEOUS) ×3 IMPLANT
FCP ESCP3.2XJMB 240X2.8X (MISCELLANEOUS)
FORCEPS BIOP RAD 4 LRG CAP 4 (CUTTING FORCEPS) IMPLANT
FORCEPS BIOP RJ4 240 W/NDL (MISCELLANEOUS)
FORCEPS ESCP3.2XJMB 240X2.8X (MISCELLANEOUS) IMPLANT
GOWN CVR UNV OPN BCK APRN NK (MISCELLANEOUS) ×2 IMPLANT
GOWN ISOL THUMB LOOP REG UNIV (MISCELLANEOUS) ×1
GOWN STRL REUS W/ TWL LRG LVL3 (GOWN DISPOSABLE) ×2 IMPLANT
GOWN STRL REUS W/TWL LRG LVL3 (GOWN DISPOSABLE) ×1
HEMOCLIP INSTINCT (CLIP) IMPLANT
INJECTOR VARIJECT VIN23 (MISCELLANEOUS) IMPLANT
KIT CO2 TUBING (TUBING) IMPLANT
KIT DEFENDO VALVE AND CONN (KITS) IMPLANT
KIT ENDO PROCEDURE OLY (KITS) ×3 IMPLANT
LIGATOR MULTIBAND 6SHOOTER MBL (MISCELLANEOUS) IMPLANT
MARKER SPOT ENDO TATTOO 5ML (MISCELLANEOUS) IMPLANT
PAD GROUND ADULT SPLIT (MISCELLANEOUS) IMPLANT
SNARE SHORT THROW 13M SML OVAL (MISCELLANEOUS) ×3 IMPLANT
SNARE SHORT THROW 30M LRG OVAL (MISCELLANEOUS) IMPLANT
SPOT EX ENDOSCOPIC TATTOO (MISCELLANEOUS)
SUCTION POLY TRAP 4CHAMBER (MISCELLANEOUS) IMPLANT
SYR INFLATION 60ML (SYRINGE) IMPLANT
TRAP SUCTION POLY (MISCELLANEOUS) ×3 IMPLANT
TUBING CONN 6MMX3.1M (TUBING)
TUBING SUCTION CONN 0.25 STRL (TUBING) IMPLANT
UNDERPAD 30X60 958B10 (PK) (MISCELLANEOUS) IMPLANT
VALVE BIOPSY ENDO (VALVE) IMPLANT
VARIJECT INJECTOR VIN23 (MISCELLANEOUS)
WATER AUXILLARY (MISCELLANEOUS) IMPLANT
WATER STERILE IRR 250ML POUR (IV SOLUTION) ×3 IMPLANT
WATER STERILE IRR 500ML POUR (IV SOLUTION) IMPLANT
WIRE CRE 18-20MM 8CM F G (MISCELLANEOUS) IMPLANT

## 2015-02-26 NOTE — Anesthesia Postprocedure Evaluation (Signed)
Anesthesia Post Note  Patient: Cody Howard  Procedure(s) Performed: Procedure(s) (LRB): COLONOSCOPY (N/A) ESOPHAGOGASTRODUODENOSCOPY (EGD) (N/A) POLYPECTOMY  Patient location during evaluation: PACU Anesthesia Type: General Level of consciousness: awake and alert Pain management: pain level controlled Vital Signs Assessment: post-procedure vital signs reviewed and stable Respiratory status: spontaneous breathing, nonlabored ventilation, respiratory function stable and patient connected to nasal cannula oxygen Cardiovascular status: blood pressure returned to baseline and stable Postop Assessment: no signs of nausea or vomiting Anesthetic complications: no    Presten Joost D Kaela Beitz

## 2015-02-26 NOTE — Anesthesia Procedure Notes (Signed)
Procedure Name: MAC Date/Time: 02/26/2015 9:01 AM Performed by: Cameron Ali Pre-anesthesia Checklist: Patient identified, Emergency Drugs available, Suction available, Timeout performed and Patient being monitored Patient Re-evaluated:Patient Re-evaluated prior to inductionOxygen Delivery Method: Nasal cannula Placement Confirmation: positive ETCO2

## 2015-02-26 NOTE — Transfer of Care (Signed)
Immediate Anesthesia Transfer of Care Note  Patient: Cody Howard  Procedure(s) Performed: Procedure(s) with comments: COLONOSCOPY (N/A) ESOPHAGOGASTRODUODENOSCOPY (EGD) (N/A) - Diabetic - oral meds POLYPECTOMY  Patient Location: PACU  Anesthesia Type: MAC  Level of Consciousness: awake, alert  and patient cooperative  Airway and Oxygen Therapy: Patient Spontanous Breathing and Patient connected to supplemental oxygen  Post-op Assessment: Post-op Vital signs reviewed, Patient's Cardiovascular Status Stable, Respiratory Function Stable, Patent Airway and No signs of Nausea or vomiting  Post-op Vital Signs: Reviewed and stable  Complications: No apparent anesthesia complications

## 2015-02-26 NOTE — H&P (Signed)
  Date of Initial H&P: 02/13/2015  History reviewed, patient examined, no change in status, stable for surgery.

## 2015-02-26 NOTE — Anesthesia Preprocedure Evaluation (Signed)
Anesthesia Evaluation  Patient identified by MRN, date of birth, ID band Patient awake    Reviewed: Allergy & Precautions, H&P , NPO status , Patient's Chart, lab work & pertinent test results, reviewed documented beta blocker date and time   Airway Mallampati: I  TM Distance: >3 FB Neck ROM: full    Dental no notable dental hx.    Pulmonary former smoker,    Pulmonary exam normal breath sounds clear to auscultation       Cardiovascular Exercise Tolerance: Good negative cardio ROS   Rhythm:regular Rate:Normal     Neuro/Psych negative neurological ROS  negative psych ROS   GI/Hepatic negative GI ROS, Neg liver ROS,   Endo/Other  diabetes, Well Controlled, Type 2, Oral Hypoglycemic Agents  Renal/GU CRFRenal disease  negative genitourinary   Musculoskeletal   Abdominal   Peds  Hematology negative hematology ROS (+)   Anesthesia Other Findings   Reproductive/Obstetrics negative OB ROS                             Anesthesia Physical Anesthesia Plan  ASA: II  Anesthesia Plan: MAC   Post-op Pain Management:    Induction:   Airway Management Planned:   Additional Equipment:   Intra-op Plan:   Post-operative Plan:   Informed Consent: I have reviewed the patients History and Physical, chart, labs and discussed the procedure including the risks, benefits and alternatives for the proposed anesthesia with the patient or authorized representative who has indicated his/her understanding and acceptance.     Plan Discussed with: CRNA  Anesthesia Plan Comments:         Anesthesia Quick Evaluation

## 2015-02-26 NOTE — Op Note (Addendum)
[facility] Patient Name: Cody Howard Procedure Date: 02/26/2015 8:53 AM MRN: [id number] Account #: [id number] Date of Birth: 1934/11/28 Admit Type: Outpatient Age: 80 Room: [facility] OR ROOM 01 Gender: Male Note Status: Supervisor Override Procedure:         Colonoscopy Indications:       Melena, Rectal bleeding, Personal history of colonic polyps Providers:         Lupita Dawn. Candace Cruise, MD Referring MD:      [name], MD (Referring MD) Medicines:         Monitored Anesthesia Care Complications:     No immediate complications. Procedure:         Pre-Anesthesia Assessment:                    - Prior to the procedure, a History and Physical was                     performed, and patient medications, allergies and                     sensitivities were reviewed. The patient's tolerance of                     previous anesthesia was reviewed.                    - The risks and benefits of the procedure and the sedation                     options and risks were discussed with the patient. All                     questions were answered and informed consent was obtained.                    - After reviewing the risks and benefits, the patient was                     deemed in satisfactory condition to undergo the procedure.                    After obtaining informed consent, the colonoscope was                     passed under direct vision. Throughout the procedure, the                     patient's blood pressure, pulse, and oxygen saturations                     were monitored continuously. The Olympus CF-HQ190L                     Colonoscope (S#. [id number]) was introduced through the anus                     and advanced to the the cecum, identified by appendiceal                     orifice and ileocecal valve. Findings:      Multiple small and large-mouthed diverticula were found in the entire       colon.      A small polyp was found in the  ascending colon. The polyp was  sessile.       The polyp was removed with a cold snare. Resection and retrieval were       complete.      A small polyp was found in the sigmoid colon. The polyp was sessile. The       polyp was removed with a cold snare. Resection and retrieval were       complete.      The exam was otherwise without abnormality. Impression:        - Diverticulosis in the entire examined colon.                    - One small polyp in the ascending colon. Resected and                     retrieved.                    - One small polyp in the sigmoid colon. Resected and                     retrieved.                    - The examination was otherwise normal. Recommendation:    - Discharge patient to home.                    - Await pathology results.                    - The findings and recommendations were discussed with the                     patient. Hulen Luster, MD 02/26/2015 9:31:10 AM This report has been signed electronically. Number of Addenda: 0 Note Initiated On: 02/26/2015 8:53 AM Scope Withdrawal Time: 0 hours 9 minutes 0 seconds  Total Procedure Duration: 0 hours 14 minutes 2 seconds       [facility]

## 2015-02-26 NOTE — Op Note (Signed)
[facility] Patient Name: Cody Howard Procedure Date: 02/26/2015 8:54 AM MRN: [id number] Account #: [id number] Date of Birth: January 21, 1935 Admit Type: Outpatient Age: 80 Room: [facility] OR ROOM 01 Gender: Male Note Status: Finalized Procedure:         Upper GI endoscopy Indications:       Globus sensation Providers:         Lupita Dawn. Candace Cruise, MD Referring MD:      [name], MD (Referring MD) Medicines:         Monitored Anesthesia Care Complications:     No immediate complications. Procedure:         Pre-Anesthesia Assessment:                    - Prior to the procedure, a History and Physical was                     performed, and patient medications, allergies and                     sensitivities were reviewed. The patient's tolerance of                     previous anesthesia was reviewed.                    - The risks and benefits of the procedure and the sedation                     options and risks were discussed with the patient. All                     questions were answered and informed consent was obtained.                    - After reviewing the risks and benefits, the patient was                     deemed in satisfactory condition to undergo the procedure.                    After obtaining informed consent, the endoscope was passed                     under direct vision. Throughout the procedure, the                     patient's blood pressure, pulse, and oxygen saturations                     were monitored continuously. The was introduced through                     the mouth, and advanced to the second part of duodenum.                     The upper GI endoscopy was accomplished without                     difficulty. The patient tolerated the procedure well. Findings:      The examined esophagus was normal.      The entire examined stomach was normal.      The examined duodenum was normal. Impression:        - Normal  esophagus.                    - Normal stomach.                    - Normal examined duodenum.                    - No specimens collected. Recommendation:    - Discharge patient to home.                    - Observe patient's clinical course.                    - The findings and recommendations were discussed with the                     patient's family. Procedure Code(s): --- Professional ---                    [phone], Esophagogastroduodenoscopy, flexible, transoral;                     diagnostic, including collection of specimen(s) by                     brushing or washing, when performed (separate procedure) Diagnosis Code(s): --- Professional ---                    F45.8, Other somatoform disorders CPT copyright 2014 American Medical Association. All rights reserved. The codes documented in this report are preliminary and upon coder review may  be revised to meet current compliance requirements. Hulen Luster, MD 02/26/2015 9:13:26 AM This report has been signed electronically. Number of Addenda: 0 Note Initiated On: 02/26/2015 8:54 AM Total Procedure Duration: 0 hours 2 minutes 37 seconds       [facility]

## 2015-02-27 ENCOUNTER — Encounter: Payer: Self-pay | Admitting: Gastroenterology

## 2015-02-28 LAB — SURGICAL PATHOLOGY

## 2015-03-10 ENCOUNTER — Other Ambulatory Visit: Payer: Self-pay | Admitting: Family Medicine

## 2015-03-11 ENCOUNTER — Other Ambulatory Visit: Payer: Self-pay

## 2015-03-11 DIAGNOSIS — R69 Illness, unspecified: Secondary | ICD-10-CM | POA: Diagnosis not present

## 2015-03-14 ENCOUNTER — Encounter: Payer: Self-pay | Admitting: Family Medicine

## 2015-03-14 ENCOUNTER — Ambulatory Visit (INDEPENDENT_AMBULATORY_CARE_PROVIDER_SITE_OTHER): Payer: Medicare HMO | Admitting: Family Medicine

## 2015-03-14 VITALS — BP 120/62 | HR 80 | Ht 71.0 in | Wt 203.0 lb

## 2015-03-14 DIAGNOSIS — J0100 Acute maxillary sinusitis, unspecified: Secondary | ICD-10-CM | POA: Diagnosis not present

## 2015-03-14 MED ORDER — GUAIFENESIN-CODEINE 100-10 MG/5ML PO SOLN: 5.0000 mL | Freq: Three times a day (TID) | ORAL | Status: DC | PRN

## 2015-03-14 MED ORDER — LEVOFLOXACIN 500 MG PO TABS: 500.0000 mg | ORAL_TABLET | Freq: Every day | ORAL | Status: DC

## 2015-03-14 NOTE — Progress Notes (Signed)
Name: Cody Howard   MRN: [id number]    DOB: 10-20-1934   Date:03/14/2015       Progress Note  Subjective  Chief Complaint  Chief Complaint  Patient presents with  . Sinusitis    cough- yellow production    Sinusitis This is a chronic problem. The current episode started 1 to 4 weeks ago. The problem is unchanged. There has been no fever. Associated symptoms include coughing, sinus pressure and a sore throat. Pertinent negatives include no chills, congestion, diaphoresis, ear pain, headaches, hoarse voice, neck pain, shortness of breath, sneezing or swollen glands. Past treatments include acetaminophen and antibiotics. The treatment provided mild relief.    No problem-specific assessment & plan notes found for this encounter.   Past Medical History  Diagnosis Date  . Diabetes mellitus without complication ([place])   . Chronic kidney disease   . Prostate enlargement   . Hyperlipidemia   . Arthritis     hands    Past Surgical History  Procedure Laterality Date  . Kidney surgery Right     surgery when 81 years old  . Sebaceous cyst removal Right     located right of spine on upper back  . Colonoscopy N/A 02/26/2015    Procedure: COLONOSCOPY;  Surgeon: Hulen Luster, MD;  Location: [facility];  Service: Gastroenterology;  Laterality: N/A;  . Esophagogastroduodenoscopy N/A 02/26/2015    Procedure: ESOPHAGOGASTRODUODENOSCOPY (EGD);  Surgeon: Hulen Luster, MD;  Location: [facility];  Service: Gastroenterology;  Laterality: N/A;  Diabetic - oral meds  . Polypectomy  02/26/2015    Procedure: POLYPECTOMY;  Surgeon: Hulen Luster, MD;  Location: [facility];  Service: Gastroenterology;;    Family History  Problem Relation Age of Onset  . Diabetes Father   . Heart disease Father     Social History   Social History  . Marital Status: Married    Spouse Name: N/A  . Number of Children: N/A  . Years of Education: N/A   Occupational History  . Not on file.    Social History Main Topics  . Smoking status: Former Smoker    Quit date: 03/08/1978  . Smokeless tobacco: Not on file  . Alcohol Use: 0.0 oz/week    0 Standard drinks or equivalent per week     Comment: Rarely  . Drug Use: No  . Sexual Activity: Not Currently   Other Topics Concern  . Not on file   Social History Narrative    No Known Allergies   Review of Systems  Constitutional: Negative for fever, chills, weight loss, malaise/fatigue and diaphoresis.  HENT: Positive for sinus pressure and sore throat. Negative for congestion, ear discharge, ear pain, hoarse voice and sneezing.   Eyes: Negative for blurred vision.  Respiratory: Positive for cough. Negative for sputum production, shortness of breath and wheezing.   Cardiovascular: Negative for chest pain, palpitations and leg swelling.  Gastrointestinal: Negative for heartburn, nausea, abdominal pain, diarrhea, constipation, blood in stool and melena.  Genitourinary: Negative for dysuria, urgency, frequency and hematuria.  Musculoskeletal: Negative for myalgias, back pain, joint pain and neck pain.  Skin: Negative for rash.  Neurological: Negative for dizziness, tingling, sensory change, focal weakness and headaches.  Endo/Heme/Allergies: Negative for environmental allergies and polydipsia. Does not bruise/bleed easily.  Psychiatric/Behavioral: Negative for depression and suicidal ideas. The patient is not nervous/anxious and does not have insomnia.      Objective  Filed Vitals:   03/14/15 1041  BP:  120/62  Pulse: 80  Height: 5\' 11"  (1.803 m)  Weight: 203 lb (92.08 kg)    Physical Exam  Constitutional: He is oriented to person, place, and time and well-developed, well-nourished, and in no distress.  HENT:  Head: Normocephalic.  Right Ear: External ear normal.  Left Ear: External ear normal.  Nose: Nose normal.  Mouth/Throat: Oropharynx is clear and moist.  Eyes: Conjunctivae and EOM are normal. Pupils are  equal, round, and reactive to light. Right eye exhibits no discharge. Left eye exhibits no discharge. No scleral icterus.  Neck: Normal range of motion. Neck supple. No JVD present. No tracheal deviation present. No thyromegaly present.  Cardiovascular: Normal rate, regular rhythm, normal heart sounds and intact distal pulses.  Exam reveals no gallop and no friction rub.   No murmur heard. Pulmonary/Chest: Breath sounds normal. No respiratory distress. He has no wheezes. He has no rales. He exhibits no tenderness.  Abdominal: Soft. Bowel sounds are normal. He exhibits no mass. There is no hepatosplenomegaly. There is no tenderness. There is no rebound, no guarding and no CVA tenderness.  Musculoskeletal: Normal range of motion. He exhibits no edema or tenderness.  Lymphadenopathy:    He has no cervical adenopathy.  Neurological: He is alert and oriented to person, place, and time. He has normal sensation, normal strength and intact cranial nerves. No cranial nerve deficit.  Skin: Skin is warm. No rash noted.  Psychiatric: Mood and affect normal.      Assessment & Plan  Problem List Items Addressed This Visit    None    Visit Diagnoses    Acute maxillary sinusitis, recurrence not specified    -  Primary    Relevant Medications    guaiFENesin-codeine 100-10 MG/5ML syrup    levofloxacin (LEVAQUIN) 500 MG tablet         Dr. Deanna Jones [facility]  03/14/2015

## 2015-05-04 DIAGNOSIS — R69 Illness, unspecified: Secondary | ICD-10-CM | POA: Diagnosis not present

## 2015-05-21 DIAGNOSIS — Z0100 Encounter for examination of eyes and vision without abnormal findings: Secondary | ICD-10-CM | POA: Diagnosis not present

## 2015-05-21 DIAGNOSIS — H5203 Hypermetropia, bilateral: Secondary | ICD-10-CM | POA: Diagnosis not present

## 2015-05-21 DIAGNOSIS — E119 Type 2 diabetes mellitus without complications: Secondary | ICD-10-CM | POA: Diagnosis not present

## 2015-05-26 ENCOUNTER — Other Ambulatory Visit: Payer: Self-pay

## 2015-06-15 ENCOUNTER — Other Ambulatory Visit: Payer: Self-pay | Admitting: Family Medicine

## 2015-06-16 DIAGNOSIS — R69 Illness, unspecified: Secondary | ICD-10-CM | POA: Diagnosis not present

## 2015-06-18 DIAGNOSIS — L905 Scar conditions and fibrosis of skin: Secondary | ICD-10-CM | POA: Diagnosis not present

## 2015-06-18 DIAGNOSIS — L728 Other follicular cysts of the skin and subcutaneous tissue: Secondary | ICD-10-CM | POA: Diagnosis not present

## 2015-06-18 DIAGNOSIS — Z85828 Personal history of other malignant neoplasm of skin: Secondary | ICD-10-CM | POA: Diagnosis not present

## 2015-06-18 DIAGNOSIS — D2372 Other benign neoplasm of skin of left lower limb, including hip: Secondary | ICD-10-CM | POA: Diagnosis not present

## 2015-06-18 DIAGNOSIS — L570 Actinic keratosis: Secondary | ICD-10-CM | POA: Diagnosis not present

## 2015-06-18 DIAGNOSIS — L821 Other seborrheic keratosis: Secondary | ICD-10-CM | POA: Diagnosis not present

## 2015-06-18 DIAGNOSIS — Z08 Encounter for follow-up examination after completed treatment for malignant neoplasm: Secondary | ICD-10-CM | POA: Diagnosis not present

## 2015-06-21 ENCOUNTER — Other Ambulatory Visit: Payer: Self-pay | Admitting: Family Medicine

## 2015-08-07 ENCOUNTER — Ambulatory Visit (INDEPENDENT_AMBULATORY_CARE_PROVIDER_SITE_OTHER): Payer: Medicare HMO | Admitting: Family Medicine

## 2015-08-07 ENCOUNTER — Encounter: Payer: Self-pay | Admitting: Family Medicine

## 2015-08-07 VITALS — BP 122/62 | HR 64 | Ht 71.0 in | Wt 197.0 lb

## 2015-08-07 DIAGNOSIS — J40 Bronchitis, not specified as acute or chronic: Secondary | ICD-10-CM

## 2015-08-07 DIAGNOSIS — J0100 Acute maxillary sinusitis, unspecified: Secondary | ICD-10-CM | POA: Diagnosis not present

## 2015-08-07 MED ORDER — HYDROCOD POLST-CPM POLST ER 10-8 MG/5ML PO SUER: 5.0000 mL | Freq: Two times a day (BID) | ORAL | Status: DC | PRN

## 2015-08-07 MED ORDER — AMOXICILLIN-POT CLAVULANATE 875-125 MG PO TABS: 1.0000 | ORAL_TABLET | Freq: Two times a day (BID) | ORAL | Status: DC

## 2015-08-07 NOTE — Progress Notes (Signed)
Name: Cody Howard   MRN: [id number]    DOB: Nov 03, 1934   Date:08/07/2015       Progress Note  Subjective  Chief Complaint  Chief Complaint  Patient presents with  . Sinusitis    cough and cong- a littlie yellow production. Nasal drainage/ tickle in throat    Sinusitis This is a new problem. The current episode started 1 to 4 weeks ago. The problem is unchanged. There has been no fever. Associated symptoms include congestion, coughing, ear pain, sinus pressure, a sore throat and swollen glands. Pertinent negatives include no chills, diaphoresis, headaches, hoarse voice, neck pain, shortness of breath or sneezing. Past treatments include oral decongestants. The treatment provided no relief.  Cough This is a new problem. The current episode started 1 to 4 weeks ago. The problem has been waxing and waning. Associated symptoms include ear pain and a sore throat. Pertinent negatives include no chest pain, chills, fever, headaches, heartburn, myalgias, rash, shortness of breath, weight loss or wheezing. The treatment provided mild relief. There is no history of environmental allergies.    No problem-specific assessment & plan notes found for this encounter.   Past Medical History  Diagnosis Date  . Diabetes mellitus without complication ([place])   . Chronic kidney disease   . Prostate enlargement   . Hyperlipidemia   . Arthritis     hands    Past Surgical History  Procedure Laterality Date  . Kidney surgery Right     surgery when 81 years old  . Sebaceous cyst removal Right     located right of spine on upper back  . Colonoscopy N/A 02/26/2015    Procedure: COLONOSCOPY;  Surgeon: Hulen Luster, MD;  Location: [facility];  Service: Gastroenterology;  Laterality: N/A;  . Esophagogastroduodenoscopy N/A 02/26/2015    Procedure: ESOPHAGOGASTRODUODENOSCOPY (EGD);  Surgeon: Hulen Luster, MD;  Location: [facility];  Service: Gastroenterology;  Laterality: N/A;  Diabetic - oral meds   . Polypectomy  02/26/2015    Procedure: POLYPECTOMY;  Surgeon: Hulen Luster, MD;  Location: [facility];  Service: Gastroenterology;;    Family History  Problem Relation Age of Onset  . Diabetes Father   . Heart disease Father     Social History   Social History  . Marital Status: Married    Spouse Name: N/A  . Number of Children: N/A  . Years of Education: N/A   Occupational History  . Not on file.   Social History Main Topics  . Smoking status: Former Smoker    Quit date: 03/08/1978  . Smokeless tobacco: Not on file  . Alcohol Use: 0.0 oz/week    0 Standard drinks or equivalent per week     Comment: Rarely  . Drug Use: No  . Sexual Activity: Not Currently   Other Topics Concern  . Not on file   Social History Narrative    No Known Allergies   Review of Systems  Constitutional: Negative for fever, chills, weight loss, malaise/fatigue and diaphoresis.  HENT: Positive for congestion, ear pain, sinus pressure and sore throat. Negative for ear discharge, hoarse voice and sneezing.   Eyes: Negative for blurred vision.  Respiratory: Positive for cough. Negative for sputum production, shortness of breath and wheezing.   Cardiovascular: Negative for chest pain, palpitations and leg swelling.  Gastrointestinal: Negative for heartburn, nausea, abdominal pain, diarrhea, constipation, blood in stool and melena.  Genitourinary: Negative for dysuria, urgency, frequency and hematuria.  Musculoskeletal: Negative  for myalgias, back pain, joint pain and neck pain.  Skin: Negative for rash.  Neurological: Negative for dizziness, tingling, sensory change, focal weakness and headaches.  Endo/Heme/Allergies: Negative for environmental allergies and polydipsia. Does not bruise/bleed easily.  Psychiatric/Behavioral: Negative for depression and suicidal ideas. The patient is not nervous/anxious and does not have insomnia.      Objective  Filed Vitals:   08/07/15 1049  BP:  122/62  Pulse: 64  Height: 5\' 11"  (1.803 m)  Weight: 197 lb (89.359 kg)    Physical Exam  Constitutional: He is oriented to person, place, and time and well-developed, well-nourished, and in no distress.  HENT:  Head: Normocephalic.  Right Ear: External ear normal.  Left Ear: External ear normal.  Nose: Nose normal.  Mouth/Throat: Oropharynx is clear and moist.  Eyes: Conjunctivae and EOM are normal. Pupils are equal, round, and reactive to light. Right eye exhibits no discharge. Left eye exhibits no discharge. No scleral icterus.  Neck: Normal range of motion. Neck supple. No JVD present. No tracheal deviation present. No thyromegaly present.  Cardiovascular: Normal rate, regular rhythm, normal heart sounds and intact distal pulses.  Exam reveals no gallop and no friction rub.   No murmur heard. Pulmonary/Chest: Breath sounds normal. No respiratory distress. He has no wheezes. He has no rales.  Abdominal: Soft. Bowel sounds are normal. He exhibits no mass. There is no hepatosplenomegaly. There is no tenderness. There is no rebound, no guarding and no CVA tenderness.  Musculoskeletal: Normal range of motion. He exhibits no edema or tenderness.  Lymphadenopathy:    He has no cervical adenopathy.  Neurological: He is alert and oriented to person, place, and time. He has normal sensation, normal strength, normal reflexes and intact cranial nerves. No cranial nerve deficit.  Skin: Skin is warm. No rash noted.  Psychiatric: Mood and affect normal.  Nursing note and vitals reviewed.     Assessment & Plan  Problem List Items Addressed This Visit    None    Visit Diagnoses    Bronchitis    -  Primary    Relevant Medications    amoxicillin-clavulanate (AUGMENTIN) 875-125 MG tablet    chlorpheniramine-HYDROcodone (TUSSIONEX PENNKINETIC ER) 10-8 MG/5ML SUER    Acute maxillary sinusitis, recurrence not specified        Relevant Medications    amoxicillin-clavulanate (AUGMENTIN) 875-125  MG tablet    chlorpheniramine-HYDROcodone (TUSSIONEX PENNKINETIC ER) 10-8 MG/5ML SUER         Dr. Matalyn Nawaz [facility]  08/07/2015

## 2015-08-31 ENCOUNTER — Other Ambulatory Visit: Payer: Self-pay | Admitting: Family Medicine

## 2015-09-19 ENCOUNTER — Other Ambulatory Visit: Payer: Self-pay | Admitting: Family Medicine

## 2015-09-19 DIAGNOSIS — R69 Illness, unspecified: Secondary | ICD-10-CM | POA: Diagnosis not present

## 2015-09-25 ENCOUNTER — Ambulatory Visit (INDEPENDENT_AMBULATORY_CARE_PROVIDER_SITE_OTHER): Payer: Medicare HMO | Admitting: Family Medicine

## 2015-09-25 ENCOUNTER — Encounter: Payer: Self-pay | Admitting: Family Medicine

## 2015-09-25 VITALS — BP 158/88 | HR 83 | Temp 98.7°F | Wt 199.0 lb

## 2015-09-25 DIAGNOSIS — L821 Other seborrheic keratosis: Secondary | ICD-10-CM | POA: Diagnosis not present

## 2015-09-25 MED ORDER — TRIAMCINOLONE ACETONIDE 0.1 % EX CREA: 1.0000 "application " | TOPICAL_CREAM | Freq: Two times a day (BID) | CUTANEOUS | Status: DC

## 2015-09-25 MED ORDER — PREDNISONE 10 MG PO TABS: 10.0000 mg | ORAL_TABLET | Freq: Every day | ORAL | Status: DC

## 2015-09-25 NOTE — Progress Notes (Signed)
Name: Cody Howard   MRN: [id number]    DOB: May 05, 1934   Date:09/25/2015       Progress Note  Subjective  Chief Complaint  Chief Complaint  Patient presents with  . Spot    on his left hand for 2-3 weeks, getting larger and is sensitive. No known injury.     Rash This is a new problem. The current episode started 1 to 4 weeks ago. The problem has been gradually worsening since onset. The affected locations include the left hand. Rash characteristics: tenderness. He was exposed to nothing. Pertinent negatives include no cough, diarrhea, fatigue, fever, joint pain, rhinorrhea, shortness of breath or sore throat. (No fever/erythema/ hx of insect bite) Treatments tried: topical alcohol.    No problem-specific assessment & plan notes found for this encounter.   Past Medical History  Diagnosis Date  . Diabetes mellitus without complication ([place])   . Chronic kidney disease   . Prostate enlargement   . Hyperlipidemia   . Arthritis     hands    Past Surgical History  Procedure Laterality Date  . Kidney surgery Right     surgery when 81 years old  . Sebaceous cyst removal Right     located right of spine on upper back  . Colonoscopy N/A 02/26/2015    Procedure: COLONOSCOPY;  Surgeon: Hulen Luster, MD;  Location: [facility];  Service: Gastroenterology;  Laterality: N/A;  . Esophagogastroduodenoscopy N/A 02/26/2015    Procedure: ESOPHAGOGASTRODUODENOSCOPY (EGD);  Surgeon: Hulen Luster, MD;  Location: [facility];  Service: Gastroenterology;  Laterality: N/A;  Diabetic - oral meds  . Polypectomy  02/26/2015    Procedure: POLYPECTOMY;  Surgeon: Hulen Luster, MD;  Location: [facility];  Service: Gastroenterology;;    Family History  Problem Relation Age of Onset  . Diabetes Father   . Heart disease Father     Social History   Social History  . Marital Status: Married    Spouse Name: N/A  . Number of Children: N/A  . Years of Education: N/A   Occupational  History  . Not on file.   Social History Main Topics  . Smoking status: Former Smoker    Quit date: 03/08/1978  . Smokeless tobacco: Never Used  . Alcohol Use: 0.0 oz/week    0 Standard drinks or equivalent per week     Comment: Rarely  . Drug Use: No  . Sexual Activity: Not Currently   Other Topics Concern  . Not on file   Social History Narrative    No Known Allergies   Review of Systems  Constitutional: Negative for fever, chills, weight loss, malaise/fatigue and fatigue.  HENT: Negative for ear discharge, ear pain, rhinorrhea and sore throat.   Eyes: Negative for blurred vision.  Respiratory: Negative for cough, sputum production, shortness of breath and wheezing.   Cardiovascular: Negative for chest pain, palpitations and leg swelling.  Gastrointestinal: Negative for heartburn, nausea, abdominal pain, diarrhea, constipation, blood in stool and melena.  Genitourinary: Negative for dysuria, urgency, frequency and hematuria.  Musculoskeletal: Negative for myalgias, back pain, joint pain and neck pain.  Skin: Positive for rash.  Neurological: Negative for dizziness, tingling, sensory change, focal weakness and headaches.  Endo/Heme/Allergies: Negative for environmental allergies and polydipsia. Does not bruise/bleed easily.  Psychiatric/Behavioral: Negative for depression and suicidal ideas. The patient is not nervous/anxious and does not have insomnia.      Objective  Filed Vitals:   09/25/15 [id number]  BP: 158/88  Pulse: 83  Temp: 98.7 F (37.1 C)  Weight: 199 lb (90.266 kg)  SpO2: 98%    Physical Exam  Constitutional: He is oriented to person, place, and time and well-developed, well-nourished, and in no distress.  HENT:  Head: Normocephalic.  Right Ear: External ear normal.  Left Ear: External ear normal.  Nose: Nose normal.  Mouth/Throat: Oropharynx is clear and moist.  Eyes: Conjunctivae and EOM are normal. Pupils are equal, round, and reactive to light.  Right eye exhibits no discharge. Left eye exhibits no discharge. No scleral icterus.  Neck: Normal range of motion. Neck supple. No JVD present. No tracheal deviation present. No thyromegaly present.  Cardiovascular: Normal rate, regular rhythm, normal heart sounds and intact distal pulses.  Exam reveals no gallop and no friction rub.   No murmur heard. Pulmonary/Chest: Breath sounds normal. No respiratory distress. He has no wheezes. He has no rales.  Abdominal: Soft. Bowel sounds are normal. He exhibits no mass. There is no hepatosplenomegaly. There is no tenderness. There is no rebound, no guarding and no CVA tenderness.  Musculoskeletal: Normal range of motion. He exhibits no edema or tenderness.  Lymphadenopathy:    He has no cervical adenopathy.  Neurological: He is alert and oriented to person, place, and time. He has normal sensation, normal strength, normal reflexes and intact cranial nerves. No cranial nerve deficit.  Skin: Skin is warm. Rash noted. Rash is maculopapular.  scaly  Psychiatric: Mood and affect normal.  Nursing note and vitals reviewed.     Assessment & Plan  Problem List Items Addressed This Visit    None    Visit Diagnoses    Seborrheic keratosis    -  Primary    Relevant Medications    triamcinolone cream (KENALOG) 0.1 %    predniSONE (DELTASONE) 10 MG tablet         Dr. Leylah Tarnow [facility]  09/25/2015

## 2015-09-25 NOTE — Patient Instructions (Signed)
Seborrheic Keratosis Seborrheic keratosis is a common, noncancerous (benign) skin growth. This condition causes waxy, rough, tan, brown, or black spots to appear on the skin. These skin growths can be flat or raised. CAUSES The cause of this condition is not known. RISK FACTORS This condition is more likely to develop in:  People who have a family history of seborrheic keratosis.  People who are 67 or older.  People who are pregnant.  People who have had estrogen replacement therapy. SYMPTOMS This condition often occurs on the face, chest, shoulders, back, or other areas. These growths:  Are usually painless, but may become irritated and itchy.  Can be yellow, brown, black, or other colors.  Are slightly raised or have a flat surface.  Are sometimes rough or wart-like in texture.  Are often waxy on the surface.  Are round or oval-shaped.  Sometimes look like they are "stuck on."  Often occur in groups, but may occur as a single growth. DIAGNOSIS This condition is diagnosed with a medical history and physical exam. A sample of the growth may be tested (skin biopsy). You may need to see a skin specialist (dermatologist). TREATMENT Treatment is not usually needed for this condition, unless the growths are irritated or are often bleeding. You may also choose to have the growths removed if you do not like their appearance. Most commonly, these growths are treated with a procedure in which liquid nitrogen is applied to "freeze" off the growth (cryosurgery). They may also be burned off with electricity or cut off. HOME CARE INSTRUCTIONS  Watch your growth for any changes.  Keep all follow-up visits as told by your health care provider. This is important.  Do not scratch or pick at the growth or growths. This can cause them to become irritated or infected. SEEK MEDICAL CARE IF:  You suddenly have many new growths.  Your growth bleeds, itches, or hurts.  Your growth suddenly  becomes larger or changes color.   This information is not intended to replace advice given to you by your health care provider. Make sure you discuss any questions you have with your health care provider.   Document Released: 03/27/2010 Document Revised: 11/13/2014 Document Reviewed: 07/10/2014 Elsevier Interactive Patient Education [employer].

## 2015-10-09 ENCOUNTER — Other Ambulatory Visit: Payer: Self-pay | Admitting: Family Medicine

## 2015-10-09 DIAGNOSIS — E119 Type 2 diabetes mellitus without complications: Secondary | ICD-10-CM

## 2015-10-09 DIAGNOSIS — R69 Illness, unspecified: Secondary | ICD-10-CM | POA: Diagnosis not present

## 2015-10-18 ENCOUNTER — Other Ambulatory Visit: Payer: Self-pay | Admitting: Family Medicine

## 2015-11-03 DIAGNOSIS — Z0000 Encounter for general adult medical examination without abnormal findings: Secondary | ICD-10-CM | POA: Diagnosis not present

## 2015-11-03 DIAGNOSIS — E119 Type 2 diabetes mellitus without complications: Secondary | ICD-10-CM | POA: Diagnosis not present

## 2015-11-03 DIAGNOSIS — E7800 Pure hypercholesterolemia, unspecified: Secondary | ICD-10-CM | POA: Diagnosis not present

## 2015-11-05 ENCOUNTER — Other Ambulatory Visit: Payer: Self-pay | Admitting: Family Medicine

## 2015-11-12 ENCOUNTER — Other Ambulatory Visit: Payer: Self-pay | Admitting: Family Medicine

## 2015-11-12 DIAGNOSIS — L821 Other seborrheic keratosis: Secondary | ICD-10-CM

## 2015-11-26 ENCOUNTER — Other Ambulatory Visit: Payer: Self-pay | Admitting: Family Medicine

## 2015-11-26 DIAGNOSIS — E119 Type 2 diabetes mellitus without complications: Secondary | ICD-10-CM

## 2015-11-26 DIAGNOSIS — R69 Illness, unspecified: Secondary | ICD-10-CM | POA: Diagnosis not present

## 2015-12-05 ENCOUNTER — Other Ambulatory Visit: Payer: Self-pay | Admitting: Family Medicine

## 2015-12-17 DIAGNOSIS — Z85828 Personal history of other malignant neoplasm of skin: Secondary | ICD-10-CM | POA: Diagnosis not present

## 2015-12-17 DIAGNOSIS — L923 Foreign body granuloma of the skin and subcutaneous tissue: Secondary | ICD-10-CM | POA: Diagnosis not present

## 2015-12-17 DIAGNOSIS — B078 Other viral warts: Secondary | ICD-10-CM | POA: Diagnosis not present

## 2015-12-17 DIAGNOSIS — L821 Other seborrheic keratosis: Secondary | ICD-10-CM | POA: Diagnosis not present

## 2015-12-17 DIAGNOSIS — C44629 Squamous cell carcinoma of skin of left upper limb, including shoulder: Secondary | ICD-10-CM | POA: Diagnosis not present

## 2015-12-17 DIAGNOSIS — L820 Inflamed seborrheic keratosis: Secondary | ICD-10-CM | POA: Diagnosis not present

## 2015-12-17 DIAGNOSIS — Z08 Encounter for follow-up examination after completed treatment for malignant neoplasm: Secondary | ICD-10-CM | POA: Diagnosis not present

## 2015-12-17 DIAGNOSIS — D485 Neoplasm of uncertain behavior of skin: Secondary | ICD-10-CM | POA: Diagnosis not present

## 2015-12-17 DIAGNOSIS — D2372 Other benign neoplasm of skin of left lower limb, including hip: Secondary | ICD-10-CM | POA: Diagnosis not present

## 2015-12-17 DIAGNOSIS — Z1810 Retained metal fragments, unspecified: Secondary | ICD-10-CM | POA: Diagnosis not present

## 2016-01-21 DIAGNOSIS — C44629 Squamous cell carcinoma of skin of left upper limb, including shoulder: Secondary | ICD-10-CM | POA: Diagnosis not present

## 2016-01-30 ENCOUNTER — Other Ambulatory Visit: Payer: Self-pay | Admitting: Family Medicine

## 2016-01-30 DIAGNOSIS — R69 Illness, unspecified: Secondary | ICD-10-CM | POA: Diagnosis not present

## 2016-02-11 DIAGNOSIS — Z48817 Encounter for surgical aftercare following surgery on the skin and subcutaneous tissue: Secondary | ICD-10-CM | POA: Diagnosis not present

## 2016-02-25 DIAGNOSIS — R69 Illness, unspecified: Secondary | ICD-10-CM | POA: Diagnosis not present

## 2016-03-24 ENCOUNTER — Other Ambulatory Visit: Payer: Self-pay | Admitting: Family Medicine

## 2016-03-26 ENCOUNTER — Telehealth: Payer: Self-pay | Admitting: Family Medicine

## 2016-03-26 NOTE — Telephone Encounter (Signed)
Calling to schedule appointment for a Med refill office visit. Line has been busy

## 2016-03-30 ENCOUNTER — Encounter: Payer: Self-pay | Admitting: Family Medicine

## 2016-04-01 ENCOUNTER — Other Ambulatory Visit: Payer: Self-pay | Admitting: Family Medicine

## 2016-04-06 ENCOUNTER — Ambulatory Visit (INDEPENDENT_AMBULATORY_CARE_PROVIDER_SITE_OTHER): Payer: Medicare HMO | Admitting: Family Medicine

## 2016-04-06 ENCOUNTER — Encounter: Payer: Self-pay | Admitting: Family Medicine

## 2016-04-06 VITALS — BP 120/80 | HR 78 | Ht 71.0 in | Wt 201.0 lb

## 2016-04-06 DIAGNOSIS — E1169 Type 2 diabetes mellitus with other specified complication: Secondary | ICD-10-CM | POA: Diagnosis not present

## 2016-04-06 DIAGNOSIS — E785 Hyperlipidemia, unspecified: Secondary | ICD-10-CM

## 2016-04-06 DIAGNOSIS — E119 Type 2 diabetes mellitus without complications: Secondary | ICD-10-CM | POA: Diagnosis not present

## 2016-04-06 DIAGNOSIS — N401 Enlarged prostate with lower urinary tract symptoms: Secondary | ICD-10-CM

## 2016-04-06 MED ORDER — SIMVASTATIN 80 MG PO TABS: 80.0000 mg | ORAL_TABLET | Freq: Every evening | ORAL | 6 refills | Status: DC

## 2016-04-06 MED ORDER — FISH OIL 1000 MG PO CAPS: 1.0000 | ORAL_CAPSULE | Freq: Every day | ORAL | 3 refills | Status: DC

## 2016-04-06 MED ORDER — TAMSULOSIN HCL 0.4 MG PO CAPS: 0.4000 mg | ORAL_CAPSULE | Freq: Every day | ORAL | 6 refills | Status: DC

## 2016-04-06 MED ORDER — METFORMIN HCL 1000 MG PO TABS: 1000.0000 mg | ORAL_TABLET | Freq: Two times a day (BID) | ORAL | 6 refills | Status: DC

## 2016-04-06 NOTE — Progress Notes (Signed)
Name: Cody Howard   MRN: [id number]    DOB: 1934/04/12   Date:04/06/2016       Progress Note  Subjective  Chief Complaint  Chief Complaint  Patient presents with  . Diabetes  . Hyperlipidemia  . Benign Prostatic Hypertrophy    takes Flomax for this    Diabetes  He presents for his follow-up diabetic visit. He has type 2 diabetes mellitus. His disease course has been stable. There are no hypoglycemic associated symptoms. Pertinent negatives for hypoglycemia include no dizziness, headaches or nervousness/anxiousness. Pertinent negatives for diabetes include no blurred vision, no chest pain, no fatigue, no foot paresthesias, no foot ulcerations, no polydipsia, no polyphagia, no polyuria, no visual change, no weakness and no weight loss. There are no hypoglycemic complications. Symptoms are stable. There are no diabetic complications. Risk factors for coronary artery disease include diabetes mellitus, dyslipidemia, hypertension, male sex and post-menopausal. Current diabetic treatment includes oral agent (monotherapy). He is compliant with treatment all of the time. His weight is stable. He is following a generally healthy diet. When asked about meal planning, he reported none. He participates in exercise intermittently. His home blood glucose trend is fluctuating minimally. His breakfast blood glucose is taken between 8-9 am. His breakfast blood glucose range is generally 110-130 mg/dl. An ACE inhibitor/angiotensin II receptor blocker is being taken. He does not see a podiatrist.Eye exam is current.  Hyperlipidemia  This is a chronic problem. The problem is controlled. Recent lipid tests were reviewed and are normal. Exacerbating diseases include diabetes. He has no history of chronic renal disease or hypothyroidism. Pertinent negatives include no chest pain, focal weakness, myalgias or shortness of breath. Current antihyperlipidemic treatment includes statins (omega 3). The current treatment  provides mild improvement of lipids. There are no compliance problems.  Risk factors for coronary artery disease include diabetes mellitus.  Benign Prostatic Hypertrophy  This is a chronic problem. The problem has been gradually improving since onset. Irritative symptoms include frequency. Irritative symptoms do not include urgency. Obstructive symptoms include dribbling and a weak stream. Obstructive symptoms do not include incomplete emptying, an intermittent stream, a slower stream or straining. Associated symptoms include hesitancy. Pertinent negatives include no chills, dysuria, hematuria, nausea or vomiting. Nothing aggravates the symptoms. Past treatments include tamsulosin. The treatment provided moderate relief.    No problem-specific Assessment & Plan notes found for this encounter.   Past Medical History:  Diagnosis Date  . Arthritis    hands  . Chronic kidney disease   . Diabetes mellitus without complication ([place])   . Hyperlipidemia   . Prostate enlargement     Past Surgical History:  Procedure Laterality Date  . COLONOSCOPY N/A 02/26/2015   Procedure: COLONOSCOPY;  Surgeon: Hulen Luster, MD;  Location: [facility];  Service: Gastroenterology;  Laterality: N/A;  . ESOPHAGOGASTRODUODENOSCOPY N/A 02/26/2015   Procedure: ESOPHAGOGASTRODUODENOSCOPY (EGD);  Surgeon: Hulen Luster, MD;  Location: [facility];  Service: Gastroenterology;  Laterality: N/A;  Diabetic - oral meds  . KIDNEY SURGERY Right    surgery when 82 years old  . POLYPECTOMY  02/26/2015   Procedure: POLYPECTOMY;  Surgeon: Hulen Luster, MD;  Location: [facility];  Service: Gastroenterology;;  . sebaceous cyst removal Right    located right of spine on upper back    Family History  Problem Relation Age of Onset  . Diabetes Father   . Heart disease Father     Social History   Social History  .  Marital status: Married    Spouse name: N/A  . Number of children: N/A  . Years of education:  N/A   Occupational History  . Not on file.   Social History Main Topics  . Smoking status: Former Smoker    Quit date: 03/08/1978  . Smokeless tobacco: Never Used  . Alcohol use 0.0 oz/week     Comment: Rarely  . Drug use: No  . Sexual activity: Not Currently   Other Topics Concern  . Not on file   Social History Narrative  . No narrative on file    No Known Allergies   Review of Systems  Constitutional: Negative for chills, fatigue, fever, malaise/fatigue and weight loss.       Mild orthostatic dizziness  HENT: Negative for ear discharge, ear pain and sore throat.   Eyes: Negative for blurred vision.  Respiratory: Negative for cough, sputum production, shortness of breath and wheezing.   Cardiovascular: Negative for chest pain, palpitations and leg swelling.  Gastrointestinal: Negative for abdominal pain, blood in stool, constipation, diarrhea, heartburn, melena, nausea and vomiting.  Genitourinary: Positive for frequency and hesitancy. Negative for dysuria, flank pain, hematuria, incomplete emptying and urgency.  Musculoskeletal: Negative for back pain, joint pain, myalgias and neck pain.  Skin: Negative for rash.  Neurological: Negative for dizziness, tingling, sensory change, focal weakness, weakness and headaches.  Endo/Heme/Allergies: Negative for environmental allergies, polydipsia and polyphagia. Does not bruise/bleed easily.  Psychiatric/Behavioral: Negative for depression and suicidal ideas. The patient is not nervous/anxious and does not have insomnia.      Objective  Vitals:   04/06/16 0925  BP: 120/80  Pulse: 78  Weight: 201 lb (91.2 kg)  Height: 5\' 11"  (1.803 m)    Physical Exam  Constitutional: He is oriented to person, place, and time and well-developed, well-nourished, and in no distress.  HENT:  Head: Normocephalic.  Right Ear: External ear normal.  Left Ear: External ear normal.  Nose: Nose normal.  Mouth/Throat: Oropharynx is clear and  moist.  Eyes: Conjunctivae and EOM are normal. Pupils are equal, round, and reactive to light. Right eye exhibits no discharge. Left eye exhibits no discharge. No scleral icterus.  Neck: Normal range of motion. Neck supple. No JVD present. No tracheal deviation present. No thyromegaly present.  Cardiovascular: Normal rate, regular rhythm, normal heart sounds and intact distal pulses.  Exam reveals no gallop and no friction rub.   No murmur heard. Pulmonary/Chest: Breath sounds normal. No respiratory distress. He has no wheezes. He has no rales. He exhibits no tenderness.  Abdominal: Soft. Bowel sounds are normal. He exhibits no mass. There is no hepatosplenomegaly. There is no tenderness. There is no rebound, no guarding and no CVA tenderness.  Genitourinary: Rectum normal and prostate normal.  Musculoskeletal: Normal range of motion. He exhibits no edema or tenderness.  Lymphadenopathy:    He has no cervical adenopathy.  Neurological: He is alert and oriented to person, place, and time. He has normal sensation, normal strength, normal reflexes and intact cranial nerves. No cranial nerve deficit.  Skin: Skin is warm. No rash noted.  Psychiatric: Mood and affect normal.  Nursing note and vitals reviewed.     Assessment & Plan  Problem List Items Addressed This Visit      Endocrine   Type 2 diabetes mellitus without complication, without long-term current use of insulin (HCC) - Primary   Relevant Medications   simvastatin (ZOCOR) 80 MG tablet   metFORMIN (GLUCOPHAGE) 1000 MG tablet  Other Relevant Orders   Hemoglobin A1c   Renal Function Panel   Hyperlipidemia associated with type 2 diabetes mellitus (HCC)   Relevant Medications   simvastatin (ZOCOR) 80 MG tablet   Omega-3 Fatty Acids (FISH OIL) 1000 MG CAPS   metFORMIN (GLUCOPHAGE) 1000 MG tablet   Other Relevant Orders   Lipid Profile    Other Visit Diagnoses    Benign prostatic hyperplasia with lower urinary tract symptoms,  symptom details unspecified       Relevant Medications   tamsulosin (FLOMAX) 0.4 MG CAPS capsule   Other Relevant Orders   PSA        Dr. Cori Henningsen [facility]  04/06/16

## 2016-04-07 LAB — RENAL FUNCTION PANEL
Albumin: 4.6 g/dL (ref 3.5–4.8)
BUN/Creatinine Ratio: 13 (ref 10–24)
BUN: 20 mg/dL (ref 8–27)
CHLORIDE: 104 mmol/L (ref 96–106)
CO2: 24 mmol/L (ref 18–29)
Calcium: 9.1 mg/dL (ref 8.6–10.2)
Creatinine, Ser: 1.49 mg/dL — ABNORMAL HIGH (ref 0.76–1.27)
GFR, EST AFRICAN AMERICAN: 51 mL/min/{1.73_m2} — AB (ref >59)
GFR, EST NON AFRICAN AMERICAN: 44 mL/min/{1.73_m2} — AB (ref >59)
GLUCOSE: 135 mg/dL — AB (ref 65–99)
PHOSPHORUS: 3.3 mg/dL (ref 2.5–4.5)
POTASSIUM: 5.2 mmol/L (ref 3.5–5.2)
SODIUM: 143 mmol/L (ref 134–144)

## 2016-04-07 LAB — LIPID PANEL
Chol/HDL Ratio: 2.5 ratio units (ref 0.0–5.0)
Cholesterol, Total: 146 mg/dL (ref 100–199)
HDL: 59 mg/dL (ref >39)
LDL Calculated: 68 mg/dL (ref 0–99)
Triglycerides: 94 mg/dL (ref 0–149)
VLDL Cholesterol Cal: 19 mg/dL (ref 5–40)

## 2016-04-07 LAB — PSA: Prostate Specific Ag, Serum: 0.4 ng/mL (ref 0.0–4.0)

## 2016-04-07 LAB — HEMOGLOBIN A1C
ESTIMATED AVERAGE GLUCOSE: 154 mg/dL
Hgb A1c MFr Bld: 7 % — ABNORMAL HIGH (ref 4.8–5.6)

## 2016-05-14 DIAGNOSIS — R69 Illness, unspecified: Secondary | ICD-10-CM | POA: Diagnosis not present

## 2016-06-16 ENCOUNTER — Encounter: Payer: Self-pay | Admitting: Family Medicine

## 2016-06-16 ENCOUNTER — Ambulatory Visit (INDEPENDENT_AMBULATORY_CARE_PROVIDER_SITE_OTHER): Payer: Medicare HMO | Admitting: Family Medicine

## 2016-06-16 VITALS — BP 130/60 | HR 64 | Ht 71.0 in | Wt 201.0 lb

## 2016-06-16 DIAGNOSIS — J0100 Acute maxillary sinusitis, unspecified: Secondary | ICD-10-CM | POA: Diagnosis not present

## 2016-06-16 MED ORDER — AMOXICILLIN 500 MG PO CAPS: 500.0000 mg | ORAL_CAPSULE | Freq: Three times a day (TID) | ORAL | 0 refills | Status: DC

## 2016-06-16 NOTE — Progress Notes (Signed)
Name: Cody Howard   MRN: [id number]    DOB: June 29, 1934   Date:06/16/2016       Progress Note  Subjective  Chief Complaint  Chief Complaint  Patient presents with  . Sinusitis    cough and cong, sore throat- using lozenges for throat    Sinusitis  This is a new problem. The current episode started yesterday. The problem has been gradually worsening since onset. There has been no fever. Associated symptoms include congestion, coughing, shortness of breath, sinus pressure and a sore throat. Pertinent negatives include no chills, diaphoresis, ear pain, headaches, hoarse voice, neck pain, sneezing or swollen glands. Treatments tried: lemon flavor  The treatment provided moderate relief.    No problem-specific Assessment & Plan notes found for this encounter.   Past Medical History:  Diagnosis Date  . Arthritis    hands  . Chronic kidney disease   . Diabetes mellitus without complication ([place])   . Hyperlipidemia   . Prostate enlargement     Past Surgical History:  Procedure Laterality Date  . COLONOSCOPY N/A 02/26/2015   Procedure: COLONOSCOPY;  Surgeon: Hulen Luster, MD;  Location: [facility];  Service: Gastroenterology;  Laterality: N/A;  . ESOPHAGOGASTRODUODENOSCOPY N/A 02/26/2015   Procedure: ESOPHAGOGASTRODUODENOSCOPY (EGD);  Surgeon: Hulen Luster, MD;  Location: [facility];  Service: Gastroenterology;  Laterality: N/A;  Diabetic - oral meds  . KIDNEY SURGERY Right    surgery when 82 years old  . POLYPECTOMY  02/26/2015   Procedure: POLYPECTOMY;  Surgeon: Hulen Luster, MD;  Location: [facility];  Service: Gastroenterology;;  . sebaceous cyst removal Right    located right of spine on upper back    Family History  Problem Relation Age of Onset  . Diabetes Father   . Heart disease Father     Social History   Social History  . Marital status: Married    Spouse name: N/A  . Number of children: N/A  . Years of education: N/A   Occupational  History  . Not on file.   Social History Main Topics  . Smoking status: Former Smoker    Quit date: 03/08/1978  . Smokeless tobacco: Never Used  . Alcohol use 0.0 oz/week     Comment: Rarely  . Drug use: No  . Sexual activity: Not Currently   Other Topics Concern  . Not on file   Social History Narrative  . No narrative on file    No Known Allergies  Outpatient Medications Prior to Visit  Medication Sig Dispense Refill  . ACCU-CHEK AVIVA PLUS test strip TEST AS DIRECTED DAILY 50 each 0  . ACCU-CHEK AVIVA PLUS test strip TEST DAILY 50 each 0  . ACCU-CHEK SOFTCLIX LANCETS lancets TEST BLOOD SUGAR EVERY DAY 100 each 0  . Blood Glucose Monitoring Suppl (ACCU-CHEK AVIVA PLUS) w/Device KIT USE AS DIRECTED 1 kit 0  . metFORMIN (GLUCOPHAGE) 1000 MG tablet Take 1 tablet (1,000 mg total) by mouth 2 (two) times daily. 60 tablet 6  . Omega-3 Fatty Acids (FISH OIL) 1000 MG CAPS Take 1 capsule (1,000 mg total) by mouth daily. 90 capsule 3  . simvastatin (ZOCOR) 80 MG tablet Take 1 tablet (80 mg total) by mouth every evening. 30 tablet 6  . tamsulosin (FLOMAX) 0.4 MG CAPS capsule Take 1 capsule (0.4 mg total) by mouth daily. 30 capsule 6   No facility-administered medications prior to visit.     Review of Systems  Constitutional: Negative for  chills, diaphoresis, fever, malaise/fatigue and weight loss.  HENT: Positive for congestion, sinus pressure and sore throat. Negative for ear discharge, ear pain, hearing loss, hoarse voice, nosebleeds, sinus pain, sneezing and tinnitus.   Eyes: Negative for blurred vision.  Respiratory: Positive for cough and shortness of breath. Negative for sputum production, wheezing and stridor.   Cardiovascular: Negative for chest pain, palpitations and leg swelling.  Gastrointestinal: Negative for abdominal pain, blood in stool, constipation, diarrhea, heartburn, melena and nausea.  Genitourinary: Negative for dysuria, frequency, hematuria and urgency.   Musculoskeletal: Negative for back pain, joint pain, myalgias and neck pain.  Skin: Negative for rash.  Neurological: Negative for dizziness, tingling, sensory change, focal weakness and headaches.  Endo/Heme/Allergies: Negative for environmental allergies and polydipsia. Does not bruise/bleed easily.  Psychiatric/Behavioral: Negative for depression and suicidal ideas. The patient is not nervous/anxious and does not have insomnia.      Objective  Vitals:   06/16/16 1121  BP: 130/60  Pulse: 64  Weight: 201 lb (91.2 kg)  Height: 5' 11" (1.803 m)    Physical Exam  Constitutional: He is oriented to person, place, and time and well-developed, well-nourished, and in no distress.  HENT:  Head: Normocephalic.  Right Ear: Tympanic membrane, external ear and ear canal normal.  Left Ear: Tympanic membrane, external ear and ear canal normal.  Nose: Mucosal edema present.  Mouth/Throat: Posterior oropharyngeal erythema present.  Eyes: Conjunctivae and EOM are normal. Pupils are equal, round, and reactive to light. Right eye exhibits no discharge. Left eye exhibits no discharge. No scleral icterus.  Neck: Normal range of motion. Neck supple. No JVD present. No tracheal deviation present. No thyromegaly present.  Cardiovascular: Normal rate, regular rhythm, normal heart sounds and intact distal pulses.  Exam reveals no gallop and no friction rub.   No murmur heard. Pulmonary/Chest: Breath sounds normal. No respiratory distress. He has no wheezes. He has no rales.  Abdominal: Soft. Bowel sounds are normal. He exhibits no mass. There is no hepatosplenomegaly. There is no tenderness. There is no rebound, no guarding and no CVA tenderness.  Musculoskeletal: Normal range of motion. He exhibits no edema or tenderness.  Lymphadenopathy:    He has no cervical adenopathy.  Neurological: He is alert and oriented to person, place, and time. He has normal sensation, normal strength and intact cranial  nerves. No cranial nerve deficit.  Skin: Skin is warm. No rash noted.  Psychiatric: Mood and affect normal.  Nursing note and vitals reviewed.     Assessment & Plan  Problem List Items Addressed This Visit    None    Visit Diagnoses    Acute non-recurrent maxillary sinusitis    -  Primary   Relevant Medications   amoxicillin (AMOXIL) 500 MG capsule      Meds ordered this encounter  Medications  . amoxicillin (AMOXIL) 500 MG capsule    Sig: Take 1 capsule (500 mg total) by mouth 3 (three) times daily.    Dispense:  30 capsule    Refill:  0      Dr. Otilio [facility]u [facility]  06/16/16

## 2016-06-18 ENCOUNTER — Other Ambulatory Visit: Payer: Self-pay

## 2016-06-18 ENCOUNTER — Telehealth: Payer: Self-pay

## 2016-06-18 NOTE — Telephone Encounter (Signed)
Pt's wife called wanting pt to have cough syrup called in. Was just seen for sinus- called into [facility]

## 2016-06-21 ENCOUNTER — Telehealth: Payer: Self-pay | Admitting: Family Medicine

## 2016-06-21 NOTE — Telephone Encounter (Signed)
Patient walked in the office requesting that a stronger cough medicine be called in for him. Pt uses the [facility] in [place]. Please advise.

## 2016-06-22 ENCOUNTER — Other Ambulatory Visit: Payer: Self-pay

## 2016-06-22 MED ORDER — BENZONATATE 100 MG PO CAPS: 100.0000 mg | ORAL_CAPSULE | Freq: Three times a day (TID) | ORAL | 0 refills | Status: DC | PRN

## 2016-06-22 NOTE — Telephone Encounter (Signed)
Spoke to pt- will call in [place]

## 2016-06-23 DIAGNOSIS — B078 Other viral warts: Secondary | ICD-10-CM | POA: Diagnosis not present

## 2016-06-23 DIAGNOSIS — Z08 Encounter for follow-up examination after completed treatment for malignant neoplasm: Secondary | ICD-10-CM | POA: Diagnosis not present

## 2016-06-23 DIAGNOSIS — D2372 Other benign neoplasm of skin of left lower limb, including hip: Secondary | ICD-10-CM | POA: Diagnosis not present

## 2016-06-23 DIAGNOSIS — Z789 Other specified health status: Secondary | ICD-10-CM | POA: Diagnosis not present

## 2016-06-23 DIAGNOSIS — Z85828 Personal history of other malignant neoplasm of skin: Secondary | ICD-10-CM | POA: Diagnosis not present

## 2016-06-23 DIAGNOSIS — L218 Other seborrheic dermatitis: Secondary | ICD-10-CM | POA: Diagnosis not present

## 2016-06-23 DIAGNOSIS — L821 Other seborrheic keratosis: Secondary | ICD-10-CM | POA: Diagnosis not present

## 2016-06-23 DIAGNOSIS — L728 Other follicular cysts of the skin and subcutaneous tissue: Secondary | ICD-10-CM | POA: Diagnosis not present

## 2016-06-23 DIAGNOSIS — L0889 Other specified local infections of the skin and subcutaneous tissue: Secondary | ICD-10-CM | POA: Diagnosis not present

## 2016-06-28 ENCOUNTER — Encounter: Payer: Self-pay | Admitting: Family Medicine

## 2016-06-28 ENCOUNTER — Ambulatory Visit (INDEPENDENT_AMBULATORY_CARE_PROVIDER_SITE_OTHER): Payer: Medicare HMO | Admitting: Family Medicine

## 2016-06-28 ENCOUNTER — Ambulatory Visit
Admission: RE | Admit: 2016-06-28 | Discharge: 2016-06-28 | Disposition: A | Discharge status: Home / Self Care | Payer: Medicare HMO | Source: Ambulatory Visit | Attending: Family Medicine | Admitting: Family Medicine

## 2016-06-28 VITALS — BP 110/62 | HR 60 | Temp 97.9°F | Ht 71.0 in | Wt 201.0 lb

## 2016-06-28 DIAGNOSIS — R05 Cough: Secondary | ICD-10-CM

## 2016-06-28 DIAGNOSIS — J0100 Acute maxillary sinusitis, unspecified: Secondary | ICD-10-CM | POA: Diagnosis not present

## 2016-06-28 DIAGNOSIS — R059 Cough, unspecified: Secondary | ICD-10-CM

## 2016-06-28 DIAGNOSIS — J4521 Mild intermittent asthma with (acute) exacerbation: Secondary | ICD-10-CM | POA: Diagnosis not present

## 2016-06-28 DIAGNOSIS — J984 Other disorders of lung: Secondary | ICD-10-CM | POA: Insufficient documentation

## 2016-06-28 MED ORDER — MONTELUKAST SODIUM 10 MG PO TABS: 10.0000 mg | ORAL_TABLET | Freq: Every day | ORAL | 3 refills | Status: DC

## 2016-06-28 MED ORDER — ALBUTEROL SULFATE HFA 108 (90 BASE) MCG/ACT IN AERS: 2.0000 | INHALATION_SPRAY | Freq: Four times a day (QID) | RESPIRATORY_TRACT | 0 refills | Status: DC | PRN

## 2016-06-28 MED ORDER — GUAIFENESIN-CODEINE 100-10 MG/5ML PO SYRP: 5.0000 mL | ORAL_SOLUTION | Freq: Three times a day (TID) | ORAL | 0 refills | Status: DC | PRN

## 2016-06-28 MED ORDER — FLUTICASONE PROPIONATE 50 MCG/ACT NA SUSP: 2.0000 | Freq: Every day | NASAL | 6 refills | Status: DC

## 2016-06-28 MED ORDER — AZITHROMYCIN 250 MG PO TABS: ORAL_TABLET | ORAL | 0 refills | Status: DC

## 2016-06-28 NOTE — Patient Instructions (Addendum)
Acute Bronchitis, Adult Acute bronchitis is sudden (acute) swelling of the air tubes (bronchi) in the lungs. Acute bronchitis causes these tubes to fill with mucus, which can make it hard to breathe. It can also cause coughing or wheezing. In adults, acute bronchitis usually goes away within 2 weeks. A cough caused by bronchitis may last up to 3 weeks. Smoking, allergies, and asthma can make the condition worse. Repeated episodes of bronchitis may cause further lung problems, such as chronic obstructive pulmonary disease (COPD). What are the causes? This condition can be caused by germs and by substances that irritate the lungs, including:  Cold and flu viruses. This condition is most often caused by the same virus that causes a cold.  Bacteria.  Exposure to tobacco smoke, dust, fumes, and air pollution. What increases the risk? This condition is more likely to develop in people who:  Have close contact with someone with acute bronchitis.  Are exposed to lung irritants, such as tobacco smoke, dust, fumes, and vapors.  Have a weak immune system.  Have a respiratory condition such as asthma. What are the signs or symptoms? Symptoms of this condition include:  A cough.  Coughing up clear, yellow, or green mucus.  Wheezing.  Chest congestion.  Shortness of breath.  A fever.  Body aches.  Chills.  A sore throat. How is this diagnosed? This condition is usually diagnosed with a physical exam. During the exam, your health care provider may order tests, such as chest X-rays, to rule out other conditions. He or she may also:  Test a sample of your mucus for bacterial infection.  Check the level of oxygen in your blood. This is done to check for pneumonia.  Do a chest X-ray or lung function testing to rule out pneumonia and other conditions.  Perform blood tests. Your health care provider will also ask about your symptoms and medical history. How is this treated? Most cases  of acute bronchitis clear up over time without treatment. Your health care provider may recommend:  Drinking more fluids. Drinking more makes your mucus thinner, which may make it easier to breathe.  Taking a medicine for a fever or cough.  Taking an antibiotic medicine.  Using an inhaler to help improve shortness of breath and to control a cough.  Using a cool mist vaporizer or humidifier to make it easier to breathe. Follow these instructions at home: Medicines  Take over-the-counter and prescription medicines only as told by your health care provider.  If you were prescribed an antibiotic, take it as told by your health care provider. Do not stop taking the antibiotic even if you start to feel better. General instructions  Get plenty of rest.  Drink enough fluids to keep your urine clear or pale yellow.  Avoid smoking and secondhand smoke. Exposure to cigarette smoke or irritating chemicals will make bronchitis worse. If you smoke and you need help quitting, ask your health care provider. Quitting smoking will help your lungs heal faster.  Use an inhaler, cool mist vaporizer, or humidifier as told by your health care provider.  Keep all follow-up visits as told by your health care provider. This is important. How is this prevented? To lower your risk of getting this condition again:  Wash your hands often with soap and water. If soap and water are not available, use hand sanitizer.  Avoid contact with people who have cold symptoms.  Try not to touch your hands to your mouth, nose, or eyes.    eyes.  Make sure to get the flu shot every year. Contact a health care provider if:  Your symptoms do not improve in 2 weeks of treatment. Get help right away if:  You cough up blood.  You have chest pain.  You have severe shortness of breath.  You become dehydrated.  You faint or keep feeling like you are going to faint.  You keep vomiting.  You have a severe headache.  Your  fever or chills gets worse. This information is not intended to replace advice given to you by your health care provider. Make sure you discuss any questions you have with your health care provider. Document Released: 04/01/2004 Document Revised: 09/17/2015 Document Reviewed: 08/13/2015 Elsevier Interactive Patient Education  2017 [place]. Allergic Rhinitis Allergic rhinitis is when the mucous membranes in the nose respond to allergens. Allergens are particles in the air that cause your body to have an allergic reaction. This causes you to release allergic antibodies. Through a chain of events, these eventually cause you to release histamine into the blood stream. Although meant to protect the body, it is this release of histamine that causes your discomfort, such as frequent sneezing, congestion, and an itchy, runny nose. What are the causes? Seasonal allergic rhinitis (hay fever) is caused by pollen allergens that may come from grasses, trees, and weeds. Year-round allergic rhinitis (perennial allergic rhinitis) is caused by allergens such as house dust mites, pet dander, and mold spores. What are the signs or symptoms?  Nasal stuffiness (congestion).  Itchy, runny nose with sneezing and tearing of the eyes. How is this diagnosed? Your health care provider can help you determine the allergen or allergens that trigger your symptoms. If you and your health care provider are unable to determine the allergen, skin or blood testing may be used. Your health care provider will diagnose your condition after taking your health history and performing a physical exam. Your health care provider may assess you for other related conditions, such as asthma, pink eye, or an ear infection. How is this treated? Allergic rhinitis does not have a cure, but it can be controlled by:  Medicines that block allergy symptoms. These may include allergy shots, nasal sprays, and oral antihistamines.  Avoiding the  allergen. Hay fever may often be treated with antihistamines in pill or nasal spray forms. Antihistamines block the effects of histamine. There are over-the-counter medicines that may help with nasal congestion and swelling around the eyes. Check with your health care provider before taking or giving this medicine. If avoiding the allergen or the medicine prescribed do not work, there are many new medicines your health care provider can prescribe. Stronger medicine may be used if initial measures are ineffective. Desensitizing injections can be used if medicine and avoidance does not work. Desensitization is when a patient is given ongoing shots until the body becomes less sensitive to the allergen. Make sure you follow up with your health care provider if problems continue. Follow these instructions at home: It is not possible to completely avoid allergens, but you can reduce your symptoms by taking steps to limit your exposure to them. It helps to know exactly what you are allergic to so that you can avoid your specific triggers. Contact a health care provider if:  You have a fever.  You develop a cough that does not stop easily (persistent).  You have shortness of breath.  You start wheezing.  Symptoms interfere with normal daily activities. This information is not intended  to replace advice given to you by your health care provider. Make sure you discuss any questions you have with your health care provider. Document Released: 11/17/2000 Document Revised: 10/24/2015 Document Reviewed: 10/30/2012 Elsevier Interactive Patient Education  2017 [place].  How to Use a Metered Dose Inhaler A metered dose inhaler is a handheld device for taking medicine that must be breathed into the lungs (inhaled). The device can be used to deliver a variety of inhaled medicines, including:  Quick relief or rescue medicines, such as bronchodilators.  Controller medicines, such as corticosteroids. The  medicine is delivered by pushing down on a metal canister to release a preset amount of spray and medicine. Each device contains the amount of medicine that is needed for a preset number of uses (inhalations). Your health care provider may recommend that you use a spacer with your inhaler to help you take the medicine more effectively. A spacer is a plastic tube with a mouthpiece on one end and an opening that connects to the inhaler on the other end. A spacer holds the medicine in a tube for a short time, which allows you to inhale more medicine. What are the risks? If you do not use your inhaler correctly, medicine might not reach your lungs to help you breathe. Inhaler medicine can cause side effects, such as:  Mouth or throat infection.  Cough.  Hoarseness.  Headache.  Nausea and vomiting.  Lung infection (pneumonia) in people who have a lung condition called COPD. How to use a metered dose inhaler without a spacer 1. Remove the cap from the inhaler. 2. If you are using the inhaler for the first time, shake it for 5 seconds, turn it away from your face, then release 4 puffs into the air. This is called priming. 3. Shake the inhaler for 5 seconds. 4. Position the inhaler so the top of the canister faces up. 5. Put your index finger on the top of the medicine canister. Support the bottom of the inhaler with your thumb. 6. Breathe out normally and as completely as possible, away from the inhaler. 7. Either place the inhaler between your teeth and close your lips tightly around the mouthpiece, or hold the inhaler 1-2 inches (2.5-5 cm) away from your open mouth. Keep your tongue down out of the way. If you are unsure which technique to use, ask your health care provider. 8. Press the canister down with your index finger to release the medicine, then inhale deeply and slowly through your mouth (not your nose) until your lungs are completely filled. Inhaling should take 4-6 seconds. 9. Hold  the medicine in your lungs for 5-10 seconds (10 seconds is best). This helps the medicine get into the small airways of your lungs. 10. With your lips in a tight circle (pursed), breathe out slowly. 11. Repeat steps 3-10 until you have taken the number of puffs that your health care provider directed. Wait about 1 minute between puffs or as directed. 12. Put the cap on the inhaler. 13. If you are using a steroid inhaler, rinse your mouth with water, gargle, and spit out the water. Do not swallow the water. How to use a metered dose inhaler with a spacer 1. Remove the cap from the inhaler. 2. If you are using the inhaler for the first time, shake it for 5 seconds, turn it away from your face, then release 4 puffs into the air. This is called priming. 3. Shake the inhaler for 5 seconds. 4.  Place the open end of the spacer onto the inhaler mouthpiece. 5. Position the inhaler so the top of the canister faces up and the spacer mouthpiece faces you. 6. Put your index finger on the top of the medicine canister. Support the bottom of the inhaler and the spacer with your thumb. 7. Breathe out normally and as completely as possible, away from the spacer. 8. Place the spacer between your teeth and close your lips tightly around it. Keep your tongue down out of the way. 9. Press the canister down with your index finger to release the medicine, then inhale deeply and slowly through your mouth (not your nose) until your lungs are completely filled. Inhaling should take 4-6 seconds. 10. Hold the medicine in your lungs for 5-10 seconds (10 seconds is best). This helps the medicine get into the small airways of your lungs. 11. With your lips in a tight circle (pursed), breathe out slowly. 12. Repeat steps 3-11 until you have taken the number of puffs that your health care provider directed. Wait about 1 minute between puffs or as directed. 13. Remove the spacer from the inhaler and put the cap on the  inhaler. 14. If you are using a steroid inhaler, rinse your mouth with water, gargle, and spit out the water. Do not swallow the water. Follow these instructions at home:  Take your inhaled medicine only as told by your health care provider. Do not use the inhaler more than directed by your health care provider.  Keep all follow-up visits as told by your health care provider. This is important.  If your inhaler has a counter, you can check it to determine how full your inhaler is. If your inhaler does not have a counter, ask your health care provider when you will need to refill your inhaler and write the refill date on a calendar or on your inhaler canister. Note that you cannot know when an inhaler is empty by shaking it.  Follow directions on the package insert for care and cleaning of your inhaler and spacer. Contact a health care provider if:  Symptoms are only partially relieved with your inhaler.  You are having trouble using your inhaler.  You have an increase in phlegm.  You have headaches. Get help right away if:  You feel little or no relief after using your inhaler.  You have dizziness.  You have a fast heart rate.  You have chills or a fever.  You have night sweats.  There is blood in your phlegm. Summary  A metered dose inhaler is a handheld device for taking medicine that must be breathed into the lungs (inhaled).  The medicine is delivered by pushing down on a metal canister to release a preset amount of spray and medicine.  Each device contains the amount of medicine that is needed for a preset number of uses (inhalations). This information is not intended to replace advice given to you by your health care provider. Make sure you discuss any questions you have with your health care provider. Document Released: 02/22/2005 Document Revised: 01/13/2016 Document Reviewed: 01/13/2016 Elsevier Interactive Patient Education  2017 [place] with  Quitting Smoking Quitting smoking is a physical and mental challenge. You will face cravings, withdrawal symptoms, and temptation. Before quitting, work with your health care provider to make a plan that can help you cope. Preparation can help you quit and keep you from giving in. How can I cope with cravings? Cravings usually last for  5-10 minutes. If you get through it, the craving will pass. Consider taking the following actions to help you cope with cravings:  Keep your mouth busy:  Chew sugar-free gum.  Suck on hard candies or a straw.  Brush your teeth.  Keep your hands and body busy:  Immediately change to a different activity when you feel a craving.  Squeeze or play with a ball.  Do an activity or a hobby, like making bead jewelry, practicing needlepoint, or working with wood.  Mix up your normal routine.  Take a short exercise break. Go for a quick walk or run up and down stairs.  Spend time in public places where smoking is not allowed.  Focus on doing something kind or helpful for someone else.  Call a friend or family member to talk during a craving.  Join a support group.  Call a quit line, such as 1-800-QUIT-NOW.  Talk with your health care provider about medicines that might help you cope with cravings and make quitting easier for you. How can I deal with withdrawal symptoms? Your body may experience negative effects as it tries to get used to not having nicotine in the system. These effects are called withdrawal symptoms. They may include:  Feeling hungrier than normal.  Trouble concentrating.  Irritability.  Trouble sleeping.  Feeling depressed.  Restlessness and agitation.  Craving a cigarette. 14.  To manage withdrawal symptoms:  Avoid places, people, and activities that trigger your cravings.  Remember why you want to quit.  Get plenty of sleep.  Avoid coffee and other caffeinated drinks. These may worsen some of your symptoms. How  can I handle social situations? Social situations can be difficult when you are quitting smoking, especially in the first few weeks. To manage this, you can:  Avoid parties, bars, and other social situations where people might be smoking.  Avoid alcohol.  Leave right away if you have the urge to smoke.  Explain to your family and friends that you are quitting smoking. Ask for understanding and support.  Plan activities with friends or family where smoking is not an option. What are some ways I can cope with stress? Wanting to smoke may cause stress, and stress can make you want to smoke. Find ways to manage your stress. Relaxation techniques can help. For example:  Breathe slowly and deeply, in through your nose and out through your mouth.  Listen to soothing, relaxing music.  Talk with a family member or friend about your stress.  Light a candle.  Soak in a bath or take a shower.  Think about a peaceful place. What are some ways I can prevent weight gain? Be aware that many people gain weight after they quit smoking. However, not everyone does. To keep from gaining weight, have a plan in place before you quit and stick to the plan after you quit. Your plan should include:  Having healthy snacks. When you have a craving, it may help to:  Eat plain popcorn, crunchy carrots, celery, or other cut vegetables.  Chew sugar-free gum.  Changing how you eat:  Eat small portion sizes at meals.  Eat 4-6 small meals throughout the day instead of 1-2 large meals a day.  Be mindful when you eat. Do not watch television or do other things that might distract you as you eat.  Exercising regularly:  Make time to exercise each day. If you do not have time for a long workout, do short bouts of exercise for  5-10 minutes several times a day.  Do some form of strengthening exercise, like weight lifting, and some form of aerobic exercise, like running or swimming.  Drinking plenty of water  or other low-calorie or no-calorie drinks. Drink 6-8 glasses of water daily, or as much as instructed by your health care provider. Summary  Quitting smoking is a physical and mental challenge. You will face cravings, withdrawal symptoms, and temptation to smoke again. Preparation can help you as you go through these challenges.  You can cope with cravings by keeping your mouth busy (such as by chewing gum), keeping your body and hands busy, and making calls to family, friends, or a helpline for people who want to quit smoking.  You can cope with withdrawal symptoms by avoiding places where people smoke, avoiding drinks with caffeine, and getting plenty of rest.  Ask your health care provider about the different ways to prevent weight gain, avoid stress, and handle social situations. This information is not intended to replace advice given to you by your health care provider. Make sure you discuss any questions you have with your health care provider. Document Released: 02/20/2016 Document Revised: 02/20/2016 Document Reviewed: 02/20/2016 Elsevier Interactive Patient Education  2017 [employer].

## 2016-06-28 NOTE — Progress Notes (Signed)
Name: Cody Howard   MRN: [id number]    DOB: 10-30-1934   Date:06/28/2016       Progress Note  Subjective  Chief Complaint  Chief Complaint  Patient presents with  . Sinusitis    cough and cong- finished Amox completely last night. Tessalon perles aren't helping that much.- no production coming up but does have some yellow production from nose    Sinusitis  This is a recurrent problem. The current episode started 1 to 4 weeks ago. The problem has been waxing and waning since onset. There has been no fever. Associated symptoms include congestion, coughing, headaches and sinus pressure. Pertinent negatives include no chills, diaphoresis, ear pain, hoarse voice, neck pain, shortness of breath, sneezing, sore throat or swollen glands. Past treatments include antibiotics. The treatment provided mild relief.  Cough  This is a chronic problem. The current episode started 1 to 4 weeks ago. The problem has been waxing and waning. The problem occurs constantly. Associated symptoms include headaches, nasal congestion and postnasal drip. Pertinent negatives include no chest pain, chills, ear pain, fever, heartburn, hemoptysis, myalgias, rash, sore throat, shortness of breath, weight loss or wheezing. The symptoms are aggravated by pollens. Treatments tried: cough medication. The treatment provided no relief. There is no history of environmental allergies.    No problem-specific Assessment & Plan notes found for this encounter.   Past Medical History:  Diagnosis Date  . Arthritis    hands  . Chronic kidney disease   . Diabetes mellitus without complication ([place])   . Hyperlipidemia   . Prostate enlargement     Past Surgical History:  Procedure Laterality Date  . COLONOSCOPY N/A 02/26/2015   Procedure: COLONOSCOPY;  Surgeon: Hulen Luster, MD;  Location: [facility];  Service: Gastroenterology;  Laterality: N/A;  . ESOPHAGOGASTRODUODENOSCOPY N/A 02/26/2015   Procedure:  ESOPHAGOGASTRODUODENOSCOPY (EGD);  Surgeon: Hulen Luster, MD;  Location: [facility];  Service: Gastroenterology;  Laterality: N/A;  Diabetic - oral meds  . KIDNEY SURGERY Right    surgery when 82 years old  . POLYPECTOMY  02/26/2015   Procedure: POLYPECTOMY;  Surgeon: Hulen Luster, MD;  Location: [facility];  Service: Gastroenterology;;  . sebaceous cyst removal Right    located right of spine on upper back    Family History  Problem Relation Age of Onset  . Diabetes Father   . Heart disease Father     Social History   Social History  . Marital status: Married    Spouse name: N/A  . Number of children: N/A  . Years of education: N/A   Occupational History  . Not on file.   Social History Main Topics  . Smoking status: Former Smoker    Quit date: 03/08/1978  . Smokeless tobacco: Never Used  . Alcohol use 0.0 oz/week     Comment: Rarely  . Drug use: No  . Sexual activity: Not Currently   Other Topics Concern  . Not on file   Social History Narrative  . No narrative on file    No Known Allergies  Outpatient Medications Prior to Visit  Medication Sig Dispense Refill  . ACCU-CHEK AVIVA PLUS test strip TEST AS DIRECTED DAILY 50 each 0  . ACCU-CHEK SOFTCLIX LANCETS lancets TEST BLOOD SUGAR EVERY DAY 100 each 0  . benzonatate (TESSALON PERLES) 100 MG capsule Take 1 capsule (100 mg total) by mouth 3 (three) times daily as needed for cough. 30 capsule 0  . Blood  Glucose Monitoring Suppl (ACCU-CHEK AVIVA PLUS) w/Device KIT USE AS DIRECTED 1 kit 0  . metFORMIN (GLUCOPHAGE) 1000 MG tablet Take 1 tablet (1,000 mg total) by mouth 2 (two) times daily. 60 tablet 6  . Omega-3 Fatty Acids (FISH OIL) 1000 MG CAPS Take 1 capsule (1,000 mg total) by mouth daily. 90 capsule 3  . simvastatin (ZOCOR) 80 MG tablet Take 1 tablet (80 mg total) by mouth every evening. 30 tablet 6  . tamsulosin (FLOMAX) 0.4 MG CAPS capsule Take 1 capsule (0.4 mg total) by mouth daily. 30 capsule 6  .  ACCU-CHEK AVIVA PLUS test strip TEST DAILY 50 each 0  . amoxicillin (AMOXIL) 500 MG capsule Take 1 capsule (500 mg total) by mouth 3 (three) times daily. (Patient not taking: Reported on 06/28/2016) 30 capsule 0   No facility-administered medications prior to visit.     Review of Systems  Constitutional: Negative for chills, diaphoresis, fever, malaise/fatigue and weight loss.  HENT: Positive for congestion, postnasal drip and sinus pressure. Negative for ear discharge, ear pain, hoarse voice, sneezing and sore throat.   Eyes: Negative for blurred vision.  Respiratory: Positive for cough. Negative for hemoptysis, sputum production, shortness of breath and wheezing.   Cardiovascular: Negative for chest pain, palpitations and leg swelling.  Gastrointestinal: Negative for abdominal pain, blood in stool, constipation, diarrhea, heartburn, melena and nausea.  Genitourinary: Negative for dysuria, frequency, hematuria and urgency.  Musculoskeletal: Negative for back pain, joint pain, myalgias and neck pain.  Skin: Negative for rash.  Neurological: Positive for headaches. Negative for dizziness, tingling, sensory change and focal weakness.  Endo/Heme/Allergies: Negative for environmental allergies and polydipsia. Does not bruise/bleed easily.  Psychiatric/Behavioral: Negative for depression and suicidal ideas. The patient is not nervous/anxious and does not have insomnia.      Objective  Vitals:   06/28/16 0835 06/28/16 0922  BP: 140/60 110/62  Pulse: 60   Temp: 97.9 F (36.6 C)   TempSrc: Oral   Weight: 201 lb (91.2 kg)   Height: '5\' 11"'  (1.803 m)     Physical Exam  Constitutional: He is oriented to person, place, and time and well-developed, well-nourished, and in no distress.  HENT:  Head: Normocephalic.  Right Ear: Tympanic membrane, external ear and ear canal normal.  Left Ear: Tympanic membrane, external ear and ear canal normal.  Nose: Nose normal.  Mouth/Throat: Uvula is  midline, oropharynx is clear and moist and mucous membranes are normal.  Eyes: Conjunctivae and EOM are normal. Pupils are equal, round, and reactive to light. Right eye exhibits no discharge. Left eye exhibits no discharge. No scleral icterus.  Neck: Normal range of motion. Neck supple. No JVD present. No tracheal deviation present. No thyromegaly present.  Cardiovascular: Normal rate, regular rhythm, normal heart sounds and intact distal pulses.  Exam reveals no gallop and no friction rub.   No murmur heard. Pulmonary/Chest: Breath sounds normal. No respiratory distress. He has no wheezes. He has no rales.  Increased E/I  Abdominal: Soft. Bowel sounds are normal. He exhibits no mass. There is no hepatosplenomegaly. There is no tenderness. There is no rebound, no guarding and no CVA tenderness.  Musculoskeletal: Normal range of motion. He exhibits no edema or tenderness.  Lymphadenopathy:    He has no cervical adenopathy.  Neurological: He is alert and oriented to person, place, and time. He has normal sensation, normal strength, normal reflexes and intact cranial nerves. No cranial nerve deficit.  Skin: Skin is warm. No rash noted.  Psychiatric: Mood and affect normal.  Nursing note and vitals reviewed.     Assessment & Plan  Problem List Items Addressed This Visit    None    Visit Diagnoses    Acute non-recurrent maxillary sinusitis    -  Primary   Relevant Medications   azithromycin (ZITHROMAX) 250 MG tablet   montelukast (SINGULAIR) 10 MG tablet   guaiFENesin-codeine (ROBITUSSIN AC) 100-10 MG/5ML syrup   fluticasone (FLONASE) 50 MCG/ACT nasal spray   Cough       Relevant Medications   azithromycin (ZITHROMAX) 250 MG tablet   albuterol (PROVENTIL HFA;VENTOLIN HFA) 108 (90 Base) MCG/ACT inhaler   montelukast (SINGULAIR) 10 MG tablet   guaiFENesin-codeine (ROBITUSSIN AC) 100-10 MG/5ML syrup   Other Relevant Orders   DG Chest 2 View (Completed)   Mild intermittent reactive  airway disease with acute exacerbation       Relevant Medications   albuterol (PROVENTIL HFA;VENTOLIN HFA) 108 (90 Base) MCG/ACT inhaler   montelukast (SINGULAIR) 10 MG tablet      Meds ordered this encounter  Medications  . azithromycin (ZITHROMAX) 250 MG tablet    Sig: 2 today then 1 a day for 4 days    Dispense:  6 tablet    Refill:  0  . albuterol (PROVENTIL HFA;VENTOLIN HFA) 108 (90 Base) MCG/ACT inhaler    Sig: Inhale 2 puffs into the lungs every 6 (six) hours as needed for wheezing or shortness of breath.    Dispense:  1 Inhaler    Refill:  0  . montelukast (SINGULAIR) 10 MG tablet    Sig: Take 1 tablet (10 mg total) by mouth at bedtime.    Dispense:  30 tablet    Refill:  3  . guaiFENesin-codeine (ROBITUSSIN AC) 100-10 MG/5ML syrup    Sig: Take 5 mLs by mouth 3 (three) times daily as needed for cough.    Dispense:  150 mL    Refill:  0  . fluticasone (FLONASE) 50 MCG/ACT nasal spray    Sig: Place 2 sprays into both nostrils daily.    Dispense:  16 g    Refill:  6      Dr. Otilio Miu [facility]  06/28/16

## 2016-06-29 ENCOUNTER — Other Ambulatory Visit: Payer: Self-pay

## 2016-07-05 ENCOUNTER — Other Ambulatory Visit: Payer: Self-pay

## 2016-07-06 ENCOUNTER — Other Ambulatory Visit: Payer: Self-pay

## 2016-07-12 DIAGNOSIS — E119 Type 2 diabetes mellitus without complications: Secondary | ICD-10-CM | POA: Diagnosis not present

## 2016-07-12 DIAGNOSIS — Z0100 Encounter for examination of eyes and vision without abnormal findings: Secondary | ICD-10-CM | POA: Diagnosis not present

## 2016-07-12 DIAGNOSIS — H52223 Regular astigmatism, bilateral: Secondary | ICD-10-CM | POA: Diagnosis not present

## 2016-07-12 DIAGNOSIS — Z961 Presence of intraocular lens: Secondary | ICD-10-CM | POA: Diagnosis not present

## 2016-07-12 DIAGNOSIS — H5203 Hypermetropia, bilateral: Secondary | ICD-10-CM | POA: Diagnosis not present

## 2016-07-12 DIAGNOSIS — H524 Presbyopia: Secondary | ICD-10-CM | POA: Diagnosis not present

## 2016-07-28 ENCOUNTER — Ambulatory Visit (INDEPENDENT_AMBULATORY_CARE_PROVIDER_SITE_OTHER): Payer: Medicare HMO | Admitting: Family Medicine

## 2016-07-28 ENCOUNTER — Encounter: Payer: Self-pay | Admitting: Family Medicine

## 2016-07-28 VITALS — BP 130/70 | HR 72 | Ht 71.0 in | Wt 203.0 lb

## 2016-07-28 DIAGNOSIS — R1013 Epigastric pain: Secondary | ICD-10-CM | POA: Diagnosis not present

## 2016-07-28 DIAGNOSIS — R079 Chest pain, unspecified: Secondary | ICD-10-CM

## 2016-07-28 DIAGNOSIS — R4701 Aphasia: Secondary | ICD-10-CM

## 2016-07-28 DIAGNOSIS — G459 Transient cerebral ischemic attack, unspecified: Secondary | ICD-10-CM

## 2016-07-28 MED ORDER — PANTOPRAZOLE SODIUM 40 MG PO TBEC: 40.0000 mg | DELAYED_RELEASE_TABLET | Freq: Every day | ORAL | 3 refills | Status: DC

## 2016-07-28 NOTE — Progress Notes (Signed)
Name: Cody Howard   MRN: [id number]    DOB: 01-Jun-1934   Date:07/28/2016       Progress Note  Subjective  Chief Complaint  Chief Complaint  Patient presents with  . Chest Pain    sharp pain in center of abdomen, gets nauseated, breaks out in a sweat, lasts less than a minute- yesterday had 2 episodes where he was trying to say something and couldn't    Chest Pain   This is a new problem. The current episode started yesterday. The onset quality is gradual. The problem occurs intermittently. The problem has been waxing and waning. The pain is present in the epigastric region. The pain is moderate. The quality of the pain is described as sharp (seconds). The pain does not radiate. Associated symptoms include abdominal pain, diaphoresis, dizziness, nausea and shortness of breath. Pertinent negatives include no back pain, claudication, fever, headaches, irregular heartbeat, near-syncope, numbness, palpitations, PND, sputum production or weakness. The treatment provided moderate relief.  Pertinent negatives for past medical history include no anxiety/panic attacks.  Neurologic Problem  The patient's primary symptoms include clumsiness and slurred speech. The patient's pertinent negatives include no altered mental status, focal sensory loss, focal weakness, loss of balance, memory loss, near-syncope, syncope, visual change or weakness. This is a chronic problem. The current episode started in the past 7 days. The problem has been waxing and waning since onset. There was no focality noted. Associated symptoms include abdominal pain, chest pain, diaphoresis, dizziness, nausea and shortness of breath. Pertinent negatives include no back pain, bladder incontinence, bowel incontinence, fever, headaches, palpitations or vertigo. Past treatments include aspirin.  Abdominal Pain  This is a new problem. The current episode started yesterday. The onset quality is sudden. The problem occurs 2 to 4 times per day.  The problem has been waxing and waning. The pain is located in the epigastric region. The pain is at a severity of 9/10. The pain is moderate. The quality of the pain is sharp. Associated symptoms include nausea. Pertinent negatives include no constipation, diarrhea, fever, frequency, headaches, hematochezia or melena. He has tried nothing for the symptoms.    No problem-specific Assessment & Plan notes found for this encounter.   Past Medical History:  Diagnosis Date  . Arthritis    hands  . Chronic kidney disease   . Diabetes mellitus without complication ([place])   . Hyperlipidemia   . Prostate enlargement     Past Surgical History:  Procedure Laterality Date  . COLONOSCOPY N/A 02/26/2015   Procedure: COLONOSCOPY;  Surgeon: Hulen Luster, MD;  Location: [facility];  Service: Gastroenterology;  Laterality: N/A;  . ESOPHAGOGASTRODUODENOSCOPY N/A 02/26/2015   Procedure: ESOPHAGOGASTRODUODENOSCOPY (EGD);  Surgeon: Hulen Luster, MD;  Location: [facility];  Service: Gastroenterology;  Laterality: N/A;  Diabetic - oral meds  . KIDNEY SURGERY Right    surgery when 82 years old  . POLYPECTOMY  02/26/2015   Procedure: POLYPECTOMY;  Surgeon: Hulen Luster, MD;  Location: [facility];  Service: Gastroenterology;;  . sebaceous cyst removal Right    located right of spine on upper back    Family History  Problem Relation Age of Onset  . Diabetes Father   . Heart disease Father     Social History   Social History  . Marital status: Married    Spouse name: N/A  . Number of children: N/A  . Years of education: N/A   Occupational History  . Not on file.  Social History Main Topics  . Smoking status: Former Smoker    Quit date: 03/08/1978  . Smokeless tobacco: Never Used  . Alcohol use 0.0 oz/week     Comment: Rarely  . Drug use: No  . Sexual activity: Not Currently   Other Topics Concern  . Not on file   Social History Narrative  . No narrative on file    No  Known Allergies  Outpatient Medications Prior to Visit  Medication Sig Dispense Refill  . ACCU-CHEK AVIVA PLUS test strip TEST AS DIRECTED DAILY 50 each 0  . ACCU-CHEK SOFTCLIX LANCETS lancets TEST BLOOD SUGAR EVERY DAY 100 each 0  . albuterol (PROVENTIL HFA;VENTOLIN HFA) 108 (90 Base) MCG/ACT inhaler Inhale 2 puffs into the lungs every 6 (six) hours as needed for wheezing or shortness of breath. 1 Inhaler 0  . Blood Glucose Monitoring Suppl (ACCU-CHEK AVIVA PLUS) w/Device KIT USE AS DIRECTED 1 kit 0  . fluticasone (FLONASE) 50 MCG/ACT nasal spray Place 2 sprays into both nostrils daily. 16 g 6  . metFORMIN (GLUCOPHAGE) 1000 MG tablet Take 1 tablet (1,000 mg total) by mouth 2 (two) times daily. 60 tablet 6  . montelukast (SINGULAIR) 10 MG tablet Take 1 tablet (10 mg total) by mouth at bedtime. 30 tablet 3  . Omega-3 Fatty Acids (FISH OIL) 1000 MG CAPS Take 1 capsule (1,000 mg total) by mouth daily. 90 capsule 3  . simvastatin (ZOCOR) 80 MG tablet Take 1 tablet (80 mg total) by mouth every evening. 30 tablet 6  . tamsulosin (FLOMAX) 0.4 MG CAPS capsule Take 1 capsule (0.4 mg total) by mouth daily. 30 capsule 6  . azithromycin (ZITHROMAX) 250 MG tablet 2 today then 1 a day for 4 days 6 tablet 0  . benzonatate (TESSALON PERLES) 100 MG capsule Take 1 capsule (100 mg total) by mouth 3 (three) times daily as needed for cough. 30 capsule 0  . guaiFENesin-codeine (ROBITUSSIN AC) 100-10 MG/5ML syrup Take 5 mLs by mouth 3 (three) times daily as needed for cough. 150 mL 0   No facility-administered medications prior to visit.     Review of Systems  Constitutional: Positive for diaphoresis. Negative for fever.  Respiratory: Positive for shortness of breath. Negative for sputum production.   Cardiovascular: Positive for chest pain. Negative for palpitations, claudication, PND and near-syncope.  Gastrointestinal: Positive for abdominal pain and nausea. Negative for bowel incontinence, constipation,  diarrhea, hematochezia and melena.  Genitourinary: Negative for bladder incontinence and frequency.  Musculoskeletal: Negative for back pain.  Neurological: Positive for dizziness. Negative for vertigo, focal weakness, syncope, weakness, numbness, headaches and loss of balance.  Psychiatric/Behavioral: Negative for memory loss.     Objective  Vitals:   07/28/16 1003  BP: 130/70  Pulse: 72  Weight: 203 lb (92.1 kg)  Height: _0  (1.803 m)    Physical Exam  Constitutional: He is oriented to person, place, and time and well-developed, well-nourished, and in no distress.  HENT:  Head: Normocephalic.  Right Ear: External ear normal.  Left Ear: External ear normal.  Nose: Nose normal.  Mouth/Throat: Oropharynx is clear and moist.  Eyes: Conjunctivae and EOM are normal. Pupils are equal, round, and reactive to light. Right eye exhibits no discharge. Left eye exhibits no discharge. No scleral icterus.  Neck: Normal range of motion. Neck supple. No JVD present. No tracheal deviation present. No thyromegaly present.  Cardiovascular: Normal rate, regular rhythm, normal heart sounds and intact distal pulses.  Exam reveals no gallop  and no friction rub.   No murmur heard. Pulmonary/Chest: Breath sounds normal. No respiratory distress. He has no wheezes. He has no rales.  Abdominal: Soft. Bowel sounds are normal. He exhibits no mass. There is no hepatosplenomegaly. There is no tenderness. There is no rebound, no guarding and no CVA tenderness.  Musculoskeletal: Normal range of motion. He exhibits no edema or tenderness.  Lymphadenopathy:    He has no cervical adenopathy.  Neurological: He is alert and oriented to person, place, and time. He has normal motor skills, normal sensation, normal strength, normal reflexes and intact cranial nerves. No cranial nerve deficit.  Skin: Skin is warm. No rash noted.  Psychiatric: Mood and affect normal.  Nursing note and vitals  reviewed.     Assessment & Plan  Problem List Items Addressed This Visit    None    Visit Diagnoses    Chest pain, unspecified type    -  Primary   Relevant Orders   EKG 12-Lead (Completed)   CT Head Wo Contrast   Renal Function Panel   Transient cerebral ischemia, unspecified type       Relevant Orders   CT Head Wo Contrast   Renal Function Panel   Expressive aphasia       Relevant Orders   CT Head Wo Contrast   Epigastric pain       Relevant Medications   pantoprazole (PROTONIX) 40 MG tablet   Other Relevant Orders   US Abdomen Complete   Renal Function Panel   Lipase   Hepatic Function Panel (6)      Meds ordered this encounter  Medications  . pantoprazole (PROTONIX) 40 MG tablet    Sig: Take 1 tablet (40 mg total) by mouth daily.    Dispense:  30 tablet    Refill:  3      Dr. Macon [facility]  07/28/16

## 2016-07-29 ENCOUNTER — Other Ambulatory Visit: Payer: Self-pay

## 2016-07-29 LAB — RENAL FUNCTION PANEL
ALBUMIN: 4.5 g/dL (ref 3.5–4.8)
BUN / CREAT RATIO: 13 (ref 10–24)
BUN: 21 mg/dL (ref 8–27)
CHLORIDE: 100 mmol/L (ref 96–106)
CO2: 24 mmol/L (ref 18–29)
CREATININE: 1.56 mg/dL — AB (ref 0.76–1.27)
Calcium: 9.5 mg/dL (ref 8.6–10.2)
GFR, EST AFRICAN AMERICAN: 48 mL/min/{1.73_m2} — AB (ref >59)
GFR, EST NON AFRICAN AMERICAN: 42 mL/min/{1.73_m2} — AB (ref >59)
Glucose: 147 mg/dL — ABNORMAL HIGH (ref 65–99)
Phosphorus: 2.3 mg/dL — ABNORMAL LOW (ref 2.5–4.5)
Potassium: 4.5 mmol/L (ref 3.5–5.2)
Sodium: 141 mmol/L (ref 134–144)

## 2016-07-29 LAB — HEPATIC FUNCTION PANEL (6)
ALT: 17 IU/L (ref 0–44)
AST: 21 IU/L (ref 0–40)
Alkaline Phosphatase: 65 IU/L (ref 39–117)
BILIRUBIN TOTAL: 0.3 mg/dL (ref 0.0–1.2)
BILIRUBIN, DIRECT: 0.11 mg/dL (ref 0.00–0.40)

## 2016-07-29 LAB — LIPASE: Lipase: 47 U/L (ref 13–78)

## 2016-07-30 ENCOUNTER — Ambulatory Visit
Admission: RE | Admit: 2016-07-30 | Discharge: 2016-07-30 | Disposition: A | Discharge status: Home / Self Care | Payer: Medicare HMO | Source: Ambulatory Visit | Attending: Family Medicine | Admitting: Family Medicine

## 2016-07-30 DIAGNOSIS — N200 Calculus of kidney: Secondary | ICD-10-CM | POA: Diagnosis not present

## 2016-07-30 DIAGNOSIS — K8020 Calculus of gallbladder without cholecystitis without obstruction: Secondary | ICD-10-CM | POA: Diagnosis not present

## 2016-07-30 DIAGNOSIS — R1013 Epigastric pain: Secondary | ICD-10-CM | POA: Insufficient documentation

## 2016-07-30 DIAGNOSIS — G459 Transient cerebral ischemic attack, unspecified: Secondary | ICD-10-CM

## 2016-07-30 DIAGNOSIS — R079 Chest pain, unspecified: Secondary | ICD-10-CM

## 2016-07-30 DIAGNOSIS — R4701 Aphasia: Secondary | ICD-10-CM | POA: Diagnosis not present

## 2016-08-03 ENCOUNTER — Other Ambulatory Visit: Payer: Self-pay

## 2016-08-03 DIAGNOSIS — R4701 Aphasia: Secondary | ICD-10-CM

## 2016-08-03 DIAGNOSIS — G459 Transient cerebral ischemic attack, unspecified: Secondary | ICD-10-CM

## 2016-08-10 DIAGNOSIS — N403 Nodular prostate with lower urinary tract symptoms: Secondary | ICD-10-CM | POA: Diagnosis not present

## 2016-08-10 DIAGNOSIS — Z0000 Encounter for general adult medical examination without abnormal findings: Secondary | ICD-10-CM | POA: Diagnosis not present

## 2016-08-10 DIAGNOSIS — E7800 Pure hypercholesterolemia, unspecified: Secondary | ICD-10-CM | POA: Diagnosis not present

## 2016-08-10 DIAGNOSIS — Z87891 Personal history of nicotine dependence: Secondary | ICD-10-CM | POA: Diagnosis not present

## 2016-08-10 DIAGNOSIS — Z9849 Cataract extraction status, unspecified eye: Secondary | ICD-10-CM | POA: Diagnosis not present

## 2016-08-10 DIAGNOSIS — Z79899 Other long term (current) drug therapy: Secondary | ICD-10-CM | POA: Diagnosis not present

## 2016-08-10 DIAGNOSIS — G4700 Insomnia, unspecified: Secondary | ICD-10-CM | POA: Diagnosis not present

## 2016-08-10 DIAGNOSIS — Z7984 Long term (current) use of oral hypoglycemic drugs: Secondary | ICD-10-CM | POA: Diagnosis not present

## 2016-08-10 DIAGNOSIS — E119 Type 2 diabetes mellitus without complications: Secondary | ICD-10-CM | POA: Diagnosis not present

## 2016-08-16 ENCOUNTER — Ambulatory Visit
Admission: RE | Admit: 2016-08-16 | Discharge: 2016-08-16 | Disposition: A | Discharge status: Home / Self Care | Payer: Medicare HMO | Source: Ambulatory Visit | Attending: Family Medicine | Admitting: Family Medicine

## 2016-08-16 DIAGNOSIS — I6529 Occlusion and stenosis of unspecified carotid artery: Secondary | ICD-10-CM | POA: Diagnosis not present

## 2016-08-16 DIAGNOSIS — I6521 Occlusion and stenosis of right carotid artery: Secondary | ICD-10-CM | POA: Diagnosis not present

## 2016-08-16 DIAGNOSIS — R4701 Aphasia: Secondary | ICD-10-CM

## 2016-08-16 DIAGNOSIS — G459 Transient cerebral ischemic attack, unspecified: Secondary | ICD-10-CM

## 2016-08-19 ENCOUNTER — Other Ambulatory Visit: Payer: Self-pay | Admitting: Family Medicine

## 2016-08-23 ENCOUNTER — Other Ambulatory Visit: Payer: Self-pay

## 2016-08-23 MED ORDER — GLUCOSE BLOOD VI STRP: 1.0000 | ORAL_STRIP | Freq: Every day | 1 refills | Status: DC

## 2016-11-03 ENCOUNTER — Other Ambulatory Visit: Payer: Self-pay | Admitting: Family Medicine

## 2016-11-03 DIAGNOSIS — N401 Enlarged prostate with lower urinary tract symptoms: Secondary | ICD-10-CM

## 2016-11-03 DIAGNOSIS — E1169 Type 2 diabetes mellitus with other specified complication: Secondary | ICD-10-CM

## 2016-11-03 DIAGNOSIS — E785 Hyperlipidemia, unspecified: Principal | ICD-10-CM

## 2016-11-04 ENCOUNTER — Other Ambulatory Visit: Payer: Self-pay | Admitting: Family Medicine

## 2016-11-04 ENCOUNTER — Telehealth: Payer: Self-pay

## 2016-11-04 DIAGNOSIS — E119 Type 2 diabetes mellitus without complications: Secondary | ICD-10-CM

## 2016-11-04 NOTE — Telephone Encounter (Signed)
Patient called and LMOM C/O chest pain and abdominal pain that started yesterday. Patient states it occur on the upper abdominal and lower chest area. Patient had fatigue, SOB, dizziness and pain does not radiate.  Patient is taking Pantoprazole for GERD and adivse patient to see what happens if he takes another dose. Patient was not sure so I will speak with Dr. [name] about this.  Advise patient that if he is still feeling the pain he should seek medical attention with either Urgent care or ER.

## 2016-11-04 NOTE — Telephone Encounter (Signed)
After speaking with Dr. [name] patient is needing to go to the ER to check for signs of MI and lab work for troponin. Patient also needs to be evaluated to make sure patient is not having any Thoracic aneurysm or AAA. Patient was advise to go to ER and have someone drive him there.

## 2016-11-29 ENCOUNTER — Ambulatory Visit (INDEPENDENT_AMBULATORY_CARE_PROVIDER_SITE_OTHER): Payer: Medicare HMO | Admitting: Family Medicine

## 2016-11-29 ENCOUNTER — Encounter: Payer: Self-pay | Admitting: Family Medicine

## 2016-11-29 VITALS — BP 122/82 | HR 60 | Ht 71.0 in | Wt 193.0 lb

## 2016-11-29 DIAGNOSIS — J302 Other seasonal allergic rhinitis: Secondary | ICD-10-CM

## 2016-11-29 DIAGNOSIS — E119 Type 2 diabetes mellitus without complications: Secondary | ICD-10-CM

## 2016-11-29 DIAGNOSIS — J0100 Acute maxillary sinusitis, unspecified: Secondary | ICD-10-CM | POA: Diagnosis not present

## 2016-11-29 DIAGNOSIS — E1169 Type 2 diabetes mellitus with other specified complication: Secondary | ICD-10-CM

## 2016-11-29 DIAGNOSIS — N401 Enlarged prostate with lower urinary tract symptoms: Secondary | ICD-10-CM | POA: Diagnosis not present

## 2016-11-29 DIAGNOSIS — R1013 Epigastric pain: Secondary | ICD-10-CM | POA: Diagnosis not present

## 2016-11-29 DIAGNOSIS — E785 Hyperlipidemia, unspecified: Secondary | ICD-10-CM | POA: Diagnosis not present

## 2016-11-29 MED ORDER — METFORMIN HCL 1000 MG PO TABS: 1000.0000 mg | ORAL_TABLET | Freq: Two times a day (BID) | ORAL | 1 refills | Status: DC

## 2016-11-29 MED ORDER — TAMSULOSIN HCL 0.4 MG PO CAPS: 0.4000 mg | ORAL_CAPSULE | Freq: Every day | ORAL | 1 refills | Status: DC

## 2016-11-29 MED ORDER — MONTELUKAST SODIUM 10 MG PO TABS: 10.0000 mg | ORAL_TABLET | Freq: Every day | ORAL | 1 refills | Status: DC

## 2016-11-29 MED ORDER — SIMVASTATIN 80 MG PO TABS: 80.0000 mg | ORAL_TABLET | Freq: Every evening | ORAL | 1 refills | Status: DC

## 2016-11-29 MED ORDER — PANTOPRAZOLE SODIUM 40 MG PO TBEC: 40.0000 mg | DELAYED_RELEASE_TABLET | Freq: Every day | ORAL | 1 refills | Status: DC

## 2016-11-29 NOTE — Progress Notes (Signed)
Name: Cody Howard   MRN: [id number]    DOB: September 12, 1934   Date:11/29/2016       Progress Note  Subjective  Chief Complaint  Chief Complaint  Patient presents with  . Diabetes  . Hyperlipidemia  . Gastroesophageal Reflux  . Benign Prostatic Hypertrophy    Diabetes  He presents for his follow-up diabetic visit. He has type 2 diabetes mellitus. His disease course has been stable. Pertinent negatives for hypoglycemia include no confusion, dizziness, headaches, hunger, mood changes, nervousness/anxiousness, pallor, seizures, sleepiness, speech difficulty, sweats or tremors. Pertinent negatives for diabetes include no blurred vision, no chest pain, no fatigue, no foot paresthesias, no foot ulcerations, no polydipsia, no polyphagia, no polyuria, no visual change, no weakness and no weight loss. There are no hypoglycemic complications. Symptoms are stable. There are no diabetic complications. Risk factors for coronary artery disease include hypertension, male sex and dyslipidemia. Current diabetic treatment includes oral agent (monotherapy). He is following a generally healthy diet. He participates in exercise daily. His breakfast blood glucose is taken between 8-9 am. His breakfast blood glucose range is generally 110-130 mg/dl.  Hyperlipidemia  This is a chronic problem. The current episode started more than 1 year ago. The problem is controlled. Recent lipid tests were reviewed and are normal. He has no history of chronic renal disease, diabetes, hypothyroidism, liver disease, obesity or nephrotic syndrome. Pertinent negatives include no chest pain, focal sensory loss, focal weakness, leg pain, myalgias or shortness of breath. Current antihyperlipidemic treatment includes statins. The current treatment provides moderate improvement of lipids. There are no compliance problems.  Risk factors for coronary artery disease include hypertension.  Gastroesophageal Reflux  He reports no abdominal pain, no  chest pain, no coughing, no dysphagia, no heartburn, no nausea, no sore throat or no wheezing. This is a chronic problem. The problem has been gradually improving. Pertinent negatives include no fatigue, melena or weight loss. There are no known risk factors. He has tried a PPI for the symptoms. The treatment provided moderate relief. Past procedures do not include an abdominal ultrasound.  Benign Prostatic Hypertrophy  This is a chronic problem. The current episode started more than 1 year ago. The problem has been waxing and waning since onset. Irritative symptoms include frequency, nocturia and urgency. Obstructive symptoms include incomplete emptying, an intermittent stream, a slower stream and a weak stream. Obstructive symptoms do not include dribbling or straining. Associated symptoms include hesitancy. Pertinent negatives include no chills, dysuria, hematuria or nausea. The treatment provided moderate relief.    No problem-specific Assessment & Plan notes found for this encounter.   Past Medical History:  Diagnosis Date  . Arthritis    hands  . Chronic kidney disease   . Diabetes mellitus without complication ([place])   . Hyperlipidemia   . Prostate enlargement     Past Surgical History:  Procedure Laterality Date  . COLONOSCOPY N/A 02/26/2015   Procedure: COLONOSCOPY;  Surgeon: Hulen Luster, MD;  Location: [facility];  Service: Gastroenterology;  Laterality: N/A;  . ESOPHAGOGASTRODUODENOSCOPY N/A 02/26/2015   Procedure: ESOPHAGOGASTRODUODENOSCOPY (EGD);  Surgeon: Hulen Luster, MD;  Location: [facility];  Service: Gastroenterology;  Laterality: N/A;  Diabetic - oral meds  . KIDNEY SURGERY Right    surgery when 82 years old  . POLYPECTOMY  02/26/2015   Procedure: POLYPECTOMY;  Surgeon: Hulen Luster, MD;  Location: [facility];  Service: Gastroenterology;;  . sebaceous cyst removal Right    located right of spine  on upper back    Family History  Problem Relation  Age of Onset  . Diabetes Father   . Heart disease Father     Social History   Social History  . Marital status: Married    Spouse name: N/A  . Number of children: N/A  . Years of education: N/A   Occupational History  . Not on file.   Social History Main Topics  . Smoking status: Former Smoker    Quit date: 03/08/1978  . Smokeless tobacco: Never Used  . Alcohol use 0.0 oz/week     Comment: Rarely  . Drug use: No  . Sexual activity: Not Currently   Other Topics Concern  . Not on file   Social History Narrative  . No narrative on file    No Known Allergies  Outpatient Medications Prior to Visit  Medication Sig Dispense Refill  . ACCU-CHEK SOFTCLIX LANCETS lancets TEST BLOOD SUGAR EVERY DAY 100 each 0  . Blood Glucose Monitoring Suppl (ACCU-CHEK AVIVA PLUS) w/Device KIT USE AS DIRECTED 1 kit 0  . glucose blood (ACCU-CHEK AVIVA PLUS) test strip 1 each by Other route daily. Use as instructed 100 each 1  . Omega-3 Fatty Acids (FISH OIL) 1000 MG CAPS Take 1 capsule (1,000 mg total) by mouth daily. 90 capsule 3  . albuterol (PROVENTIL HFA;VENTOLIN HFA) 108 (90 Base) MCG/ACT inhaler Inhale 2 puffs into the lungs every 6 (six) hours as needed for wheezing or shortness of breath. 1 Inhaler 0  . metFORMIN (GLUCOPHAGE) 1000 MG tablet TAKE 1 TABLET (1,000 MG TOTAL) BY MOUTH 2 (TWO) TIMES DAILY. 60 tablet 1  . montelukast (SINGULAIR) 10 MG tablet Take 1 tablet (10 mg total) by mouth at bedtime. 30 tablet 3  . pantoprazole (PROTONIX) 40 MG tablet Take 1 tablet (40 mg total) by mouth daily. 30 tablet 3  . simvastatin (ZOCOR) 80 MG tablet TAKE 1 TABLET (80 MG TOTAL) BY MOUTH EVERY EVENING. 30 tablet 0  . tamsulosin (FLOMAX) 0.4 MG CAPS capsule TAKE 1 CAPSULE (0.4 MG TOTAL) BY MOUTH DAILY. 30 capsule 0  . fluticasone (FLONASE) 50 MCG/ACT nasal spray Place 2 sprays into both nostrils daily. 16 g 6   No facility-administered medications prior to visit.     Review of Systems   Constitutional: Negative for chills, fatigue, fever, malaise/fatigue and weight loss.  HENT: Negative for ear discharge, ear pain and sore throat.   Eyes: Negative for blurred vision.  Respiratory: Negative for cough, sputum production, shortness of breath and wheezing.   Cardiovascular: Negative for chest pain, palpitations and leg swelling.  Gastrointestinal: Negative for abdominal pain, blood in stool, constipation, diarrhea, dysphagia, heartburn, melena and nausea.  Genitourinary: Positive for frequency, hesitancy, incomplete emptying, nocturia and urgency. Negative for dysuria and hematuria.  Musculoskeletal: Negative for back pain, joint pain, myalgias and neck pain.  Skin: Negative for pallor and rash.  Neurological: Negative for dizziness, tingling, tremors, sensory change, focal weakness, seizures, speech difficulty, weakness and headaches.  Endo/Heme/Allergies: Negative for environmental allergies, polydipsia and polyphagia. Does not bruise/bleed easily.  Psychiatric/Behavioral: Negative for confusion, depression and suicidal ideas. The patient is not nervous/anxious and does not have insomnia.      Objective  Vitals:   11/29/16 1104  BP: 122/82  Pulse: 60  Weight: 193 lb (87.5 kg)  Height: _0  (1.803 m)    Physical Exam  Constitutional: He is oriented to person, place, and time and well-developed, well-nourished, and in no distress.  HENT:  Head: Normocephalic.  Right Ear: External ear normal.  Left Ear: External ear normal.  Nose: Nose normal.  Mouth/Throat: Oropharynx is clear and moist.  Eyes: Pupils are equal, round, and reactive to light. Conjunctivae and EOM are normal. Right eye exhibits no discharge. Left eye exhibits no discharge. No scleral icterus.  Neck: Normal range of motion. Neck supple. No JVD present. No tracheal deviation present. No thyromegaly present.  Cardiovascular: Normal rate, regular rhythm, normal heart sounds and intact distal pulses.   Exam reveals no gallop and no friction rub.   No murmur heard. Pulmonary/Chest: Breath sounds normal. No respiratory distress. He has no wheezes. He has no rales.  Abdominal: Soft. Bowel sounds are normal. He exhibits no mass. There is no hepatosplenomegaly. There is no tenderness. There is no rebound, no guarding and no CVA tenderness.  Genitourinary: Rectum normal and prostate normal. Rectal exam shows guaiac negative stool.  Musculoskeletal: Normal range of motion. He exhibits no edema or tenderness.  Lymphadenopathy:    He has no cervical adenopathy.  Neurological: He is alert and oriented to person, place, and time. He has normal sensation, normal strength, normal reflexes and intact cranial nerves. No cranial nerve deficit.  Skin: Skin is warm. No rash noted.  Psychiatric: Mood and affect normal.  Nursing note and vitals reviewed.     Assessment & Plan  Problem List Items Addressed This Visit      Endocrine   Type 2 diabetes mellitus without complication, without long-term current use of insulin (HCC) - Primary   Relevant Medications   metFORMIN (GLUCOPHAGE) 1000 MG tablet   simvastatin (ZOCOR) 80 MG tablet   Other Relevant Orders   Renal Function Panel   Hemoglobin A1c   Lipid Profile   Hyperlipidemia associated with type 2 diabetes mellitus (HCC)   Relevant Medications   metFORMIN (GLUCOPHAGE) 1000 MG tablet   simvastatin (ZOCOR) 80 MG tablet   Other Relevant Orders   Renal Function Panel   Lipid Profile    Other Visit Diagnoses    Epigastric pain       Relevant Medications   pantoprazole (PROTONIX) 40 MG tablet   Benign prostatic hyperplasia with lower urinary tract symptoms, symptom details unspecified       Relevant Medications   tamsulosin (FLOMAX) 0.4 MG CAPS capsule   Other Relevant Orders   PSA   Acute non-recurrent maxillary sinusitis       Relevant Medications   montelukast (SINGULAIR) 10 MG tablet   Seasonal allergic rhinitis, unspecified trigger        Relevant Medications   montelukast (SINGULAIR) 10 MG tablet      Meds ordered this encounter  Medications  . metFORMIN (GLUCOPHAGE) 1000 MG tablet    Sig: Take 1 tablet (1,000 mg total) by mouth 2 (two) times daily.    Dispense:  180 tablet    Refill:  1  . simvastatin (ZOCOR) 80 MG tablet    Sig: Take 1 tablet (80 mg total) by mouth every evening.    Dispense:  90 tablet    Refill:  1  . pantoprazole (PROTONIX) 40 MG tablet    Sig: Take 1 tablet (40 mg total) by mouth daily.    Dispense:  90 tablet    Refill:  1  . tamsulosin (FLOMAX) 0.4 MG CAPS capsule    Sig: Take 1 capsule (0.4 mg total) by mouth daily.    Dispense:  90 capsule    Refill:  1  . montelukast (SINGULAIR)  10 MG tablet    Sig: Take 1 tablet (10 mg total) by mouth at bedtime.    Dispense:  90 tablet    Refill:  1      Dr. Otilio Miu [facility]  11/29/16

## 2016-11-30 ENCOUNTER — Other Ambulatory Visit: Payer: Self-pay | Admitting: Family Medicine

## 2016-11-30 LAB — RENAL FUNCTION PANEL
Albumin: 4.6 g/dL (ref 3.5–4.8)
BUN / CREAT RATIO: 14 (ref 10–24)
BUN: 24 mg/dL (ref 8–27)
CO2: 22 mmol/L (ref 20–29)
CREATININE: 1.7 mg/dL — AB (ref 0.76–1.27)
Calcium: 9.4 mg/dL (ref 8.6–10.2)
Chloride: 104 mmol/L (ref 96–106)
GFR calc Af Amer: 43 mL/min/{1.73_m2} — ABNORMAL LOW (ref >59)
GFR calc non Af Amer: 38 mL/min/{1.73_m2} — ABNORMAL LOW (ref >59)
Glucose: 162 mg/dL — ABNORMAL HIGH (ref 65–99)
Phosphorus: 2.5 mg/dL (ref 2.5–4.5)
Potassium: 4.8 mmol/L (ref 3.5–5.2)
SODIUM: 143 mmol/L (ref 134–144)

## 2016-11-30 LAB — LIPID PANEL
CHOL/HDL RATIO: 2.3 ratio (ref 0.0–5.0)
CHOLESTEROL TOTAL: 140 mg/dL (ref 100–199)
HDL: 62 mg/dL (ref >39)
LDL CALC: 61 mg/dL (ref 0–99)
Triglycerides: 87 mg/dL (ref 0–149)
VLDL Cholesterol Cal: 17 mg/dL (ref 5–40)

## 2016-11-30 LAB — PSA: Prostate Specific Ag, Serum: 0.4 ng/mL (ref 0.0–4.0)

## 2016-11-30 LAB — HEMOGLOBIN A1C
Est. average glucose Bld gHb Est-mCnc: 148 mg/dL
Hgb A1c MFr Bld: 6.8 % — ABNORMAL HIGH (ref 4.8–5.6)

## 2016-12-10 DIAGNOSIS — R69 Illness, unspecified: Secondary | ICD-10-CM | POA: Diagnosis not present

## 2017-01-19 DIAGNOSIS — L821 Other seborrheic keratosis: Secondary | ICD-10-CM | POA: Diagnosis not present

## 2017-01-19 DIAGNOSIS — L570 Actinic keratosis: Secondary | ICD-10-CM | POA: Diagnosis not present

## 2017-01-19 DIAGNOSIS — D692 Other nonthrombocytopenic purpura: Secondary | ICD-10-CM | POA: Diagnosis not present

## 2017-01-19 DIAGNOSIS — Z08 Encounter for follow-up examination after completed treatment for malignant neoplasm: Secondary | ICD-10-CM | POA: Diagnosis not present

## 2017-01-19 DIAGNOSIS — Z85828 Personal history of other malignant neoplasm of skin: Secondary | ICD-10-CM | POA: Diagnosis not present

## 2017-01-19 DIAGNOSIS — X32XXXA Exposure to sunlight, initial encounter: Secondary | ICD-10-CM | POA: Diagnosis not present

## 2017-04-06 DIAGNOSIS — Z7984 Long term (current) use of oral hypoglycemic drugs: Secondary | ICD-10-CM | POA: Diagnosis not present

## 2017-04-06 DIAGNOSIS — E119 Type 2 diabetes mellitus without complications: Secondary | ICD-10-CM | POA: Diagnosis not present

## 2017-04-06 DIAGNOSIS — N400 Enlarged prostate without lower urinary tract symptoms: Secondary | ICD-10-CM | POA: Diagnosis not present

## 2017-04-06 DIAGNOSIS — Z823 Family history of stroke: Secondary | ICD-10-CM | POA: Diagnosis not present

## 2017-04-06 DIAGNOSIS — E785 Hyperlipidemia, unspecified: Secondary | ICD-10-CM | POA: Diagnosis not present

## 2017-04-06 DIAGNOSIS — R69 Illness, unspecified: Secondary | ICD-10-CM | POA: Diagnosis not present

## 2017-04-06 DIAGNOSIS — N529 Male erectile dysfunction, unspecified: Secondary | ICD-10-CM | POA: Diagnosis not present

## 2017-04-06 DIAGNOSIS — Z809 Family history of malignant neoplasm, unspecified: Secondary | ICD-10-CM | POA: Diagnosis not present

## 2017-04-06 DIAGNOSIS — R030 Elevated blood-pressure reading, without diagnosis of hypertension: Secondary | ICD-10-CM | POA: Diagnosis not present

## 2017-04-06 DIAGNOSIS — Z8249 Family history of ischemic heart disease and other diseases of the circulatory system: Secondary | ICD-10-CM | POA: Diagnosis not present

## 2017-04-29 DIAGNOSIS — X32XXXA Exposure to sunlight, initial encounter: Secondary | ICD-10-CM | POA: Diagnosis not present

## 2017-04-29 DIAGNOSIS — Z08 Encounter for follow-up examination after completed treatment for malignant neoplasm: Secondary | ICD-10-CM | POA: Diagnosis not present

## 2017-04-29 DIAGNOSIS — Z85828 Personal history of other malignant neoplasm of skin: Secondary | ICD-10-CM | POA: Diagnosis not present

## 2017-04-29 DIAGNOSIS — D2372 Other benign neoplasm of skin of left lower limb, including hip: Secondary | ICD-10-CM | POA: Diagnosis not present

## 2017-04-29 DIAGNOSIS — L814 Other melanin hyperpigmentation: Secondary | ICD-10-CM | POA: Diagnosis not present

## 2017-04-29 DIAGNOSIS — L218 Other seborrheic dermatitis: Secondary | ICD-10-CM | POA: Diagnosis not present

## 2017-04-29 DIAGNOSIS — L821 Other seborrheic keratosis: Secondary | ICD-10-CM | POA: Diagnosis not present

## 2017-04-29 DIAGNOSIS — L570 Actinic keratosis: Secondary | ICD-10-CM | POA: Diagnosis not present

## 2017-05-22 ENCOUNTER — Other Ambulatory Visit: Payer: Self-pay | Admitting: Family Medicine

## 2017-05-22 DIAGNOSIS — E1169 Type 2 diabetes mellitus with other specified complication: Secondary | ICD-10-CM

## 2017-05-22 DIAGNOSIS — E785 Hyperlipidemia, unspecified: Principal | ICD-10-CM

## 2017-05-23 ENCOUNTER — Other Ambulatory Visit: Payer: Self-pay | Admitting: Family Medicine

## 2017-05-23 DIAGNOSIS — N401 Enlarged prostate with lower urinary tract symptoms: Secondary | ICD-10-CM

## 2017-06-08 ENCOUNTER — Other Ambulatory Visit: Payer: Self-pay | Admitting: Family Medicine

## 2017-06-08 DIAGNOSIS — E119 Type 2 diabetes mellitus without complications: Secondary | ICD-10-CM

## 2017-06-12 ENCOUNTER — Other Ambulatory Visit: Payer: Self-pay | Admitting: Family Medicine

## 2017-06-12 DIAGNOSIS — R1013 Epigastric pain: Secondary | ICD-10-CM

## 2017-06-12 DIAGNOSIS — J302 Other seasonal allergic rhinitis: Secondary | ICD-10-CM

## 2017-06-12 DIAGNOSIS — J0100 Acute maxillary sinusitis, unspecified: Secondary | ICD-10-CM

## 2017-06-14 ENCOUNTER — Other Ambulatory Visit: Payer: Self-pay

## 2017-06-14 ENCOUNTER — Ambulatory Visit (INDEPENDENT_AMBULATORY_CARE_PROVIDER_SITE_OTHER)
Admit: 2017-06-14 | Discharge: 2017-06-14 | Disposition: A | Discharge status: Home / Self Care | Payer: Medicare HMO | Attending: Family Medicine | Admitting: Family Medicine

## 2017-06-14 ENCOUNTER — Ambulatory Visit
Admission: EM | Admit: 2017-06-14 | Discharge: 2017-06-14 | Disposition: A | Discharge status: Home / Self Care | Payer: Medicare HMO | Attending: Family Medicine | Admitting: Family Medicine

## 2017-06-14 DIAGNOSIS — E1122 Type 2 diabetes mellitus with diabetic chronic kidney disease: Secondary | ICD-10-CM | POA: Diagnosis not present

## 2017-06-14 DIAGNOSIS — S0003XA Contusion of scalp, initial encounter: Secondary | ICD-10-CM | POA: Diagnosis not present

## 2017-06-14 DIAGNOSIS — R531 Weakness: Secondary | ICD-10-CM

## 2017-06-14 DIAGNOSIS — R42 Dizziness and giddiness: Secondary | ICD-10-CM

## 2017-06-14 DIAGNOSIS — Z87891 Personal history of nicotine dependence: Secondary | ICD-10-CM | POA: Diagnosis not present

## 2017-06-14 DIAGNOSIS — Z8249 Family history of ischemic heart disease and other diseases of the circulatory system: Secondary | ICD-10-CM | POA: Diagnosis not present

## 2017-06-14 DIAGNOSIS — E785 Hyperlipidemia, unspecified: Secondary | ICD-10-CM | POA: Diagnosis not present

## 2017-06-14 DIAGNOSIS — N189 Chronic kidney disease, unspecified: Secondary | ICD-10-CM | POA: Insufficient documentation

## 2017-06-14 DIAGNOSIS — W19XXXA Unspecified fall, initial encounter: Secondary | ICD-10-CM

## 2017-06-14 DIAGNOSIS — N400 Enlarged prostate without lower urinary tract symptoms: Secondary | ICD-10-CM | POA: Diagnosis not present

## 2017-06-14 DIAGNOSIS — Z7984 Long term (current) use of oral hypoglycemic drugs: Secondary | ICD-10-CM | POA: Diagnosis not present

## 2017-06-14 DIAGNOSIS — Z79899 Other long term (current) drug therapy: Secondary | ICD-10-CM | POA: Diagnosis not present

## 2017-06-14 DIAGNOSIS — Z833 Family history of diabetes mellitus: Secondary | ICD-10-CM | POA: Diagnosis not present

## 2017-06-14 DIAGNOSIS — M19049 Primary osteoarthritis, unspecified hand: Secondary | ICD-10-CM | POA: Insufficient documentation

## 2017-06-14 LAB — COMPREHENSIVE METABOLIC PANEL
ALBUMIN: 4.1 g/dL (ref 3.5–5.0)
ALK PHOS: 69 U/L (ref 38–126)
ALT: 19 U/L (ref 17–63)
ANION GAP: 8 (ref 5–15)
AST: 25 U/L (ref 15–41)
BUN: 30 mg/dL — ABNORMAL HIGH (ref 6–20)
CHLORIDE: 107 mmol/L (ref 101–111)
CO2: 23 mmol/L (ref 22–32)
Calcium: 8.8 mg/dL — ABNORMAL LOW (ref 8.9–10.3)
Creatinine, Ser: 1.6 mg/dL — ABNORMAL HIGH (ref 0.61–1.24)
GFR calc non Af Amer: 39 mL/min — ABNORMAL LOW (ref >60)
GFR, EST AFRICAN AMERICAN: 45 mL/min — AB (ref >60)
GLUCOSE: 161 mg/dL — AB (ref 65–99)
POTASSIUM: 4.7 mmol/L (ref 3.5–5.1)
SODIUM: 138 mmol/L (ref 135–145)
Total Bilirubin: 0.6 mg/dL (ref 0.3–1.2)
Total Protein: 7.1 g/dL (ref 6.5–8.1)

## 2017-06-14 LAB — CBC WITH DIFFERENTIAL/PLATELET
BASOS PCT: 1 %
Basophils Absolute: 0 10*3/uL (ref 0–0.1)
EOS ABS: 0 10*3/uL (ref 0–0.7)
EOS PCT: 1 %
HCT: 38 % — ABNORMAL LOW (ref 40.0–52.0)
HEMOGLOBIN: 13 g/dL (ref 13.0–18.0)
LYMPHS ABS: 1.8 10*3/uL (ref 1.0–3.6)
Lymphocytes Relative: 29 %
MCH: 32.5 pg (ref 26.0–34.0)
MCHC: 34.3 g/dL (ref 32.0–36.0)
MCV: 94.6 fL (ref 80.0–100.0)
MONOS PCT: 7 %
Monocytes Absolute: 0.4 10*3/uL (ref 0.2–1.0)
NEUTROS PCT: 62 %
Neutro Abs: 3.8 10*3/uL (ref 1.4–6.5)
PLATELETS: 153 10*3/uL (ref 150–440)
RBC: 4.01 MIL/uL — ABNORMAL LOW (ref 4.40–5.90)
RDW: 14.3 % (ref 11.5–14.5)
WBC: 6.1 10*3/uL (ref 3.8–10.6)

## 2017-06-14 LAB — GLUCOSE, CAPILLARY: GLUCOSE-CAPILLARY: 163 mg/dL — AB (ref 65–99)

## 2017-06-14 NOTE — Discharge Instructions (Signed)
CT imaging and labs were unremarkable.  Rest, fluids.  Call [facility] for a follow up appt.  Take care  Dr. Lacinda Axon

## 2017-06-14 NOTE — ED Provider Notes (Signed)
[facility]    CSN: [id number] Arrival date & time: 06/14/17  1028  History   Chief Complaint Chief Complaint  Patient presents with  . Weakness   HPI  83-year-old male presents with generalized weakness.  Patient states that he got up this morning to go to the restroom.  He urinated and then subsequently headed back to bed.  He states that he felt extremely weak and kneel down and suddenly fell on the floor.  He is really unsure if he hit his head.  He states that he was so weak he had difficulty getting up.  He states that he laid on the floor for several minutes before he crawled back to bed.  He states that he continues to feel poorly.  He feels generally weak.  He has no focality to his symptoms.  He states that he was feeling well last night.  No recent medication changes.  No known inciting factor for his symptoms.  No reports of syncope or presyncope.  No fevers or chills.  No hypoglycemia.  No abdominal pain, chest pain, shortness of breath.  No vision changes.  He called his doctor's office this morning and was instructed to come here for further evaluation.  Past Medical History:  Diagnosis Date  . Arthritis    hands  . Chronic kidney disease   . Diabetes mellitus without complication (HCC)   . Hyperlipidemia   . Prostate enlargement     Patient Active Problem List   Diagnosis Date Noted  . Hyperlipidemia associated with type 2 diabetes mellitus (HCC) 04/06/2016  . Type 2 diabetes mellitus without complication, without long-term current use of insulin (HCC) 02/10/2015  . History of BPH 02/10/2015  . Sebaceous cyst 08/28/2014    Past Surgical History:  Procedure Laterality Date  . COLONOSCOPY N/A 02/26/2015   Procedure: COLONOSCOPY;  Surgeon: Paul Y Oh, MD;  Location: [facility];  Service: Gastroenterology;  Laterality: N/A;  . ESOPHAGOGASTRODUODENOSCOPY N/A 02/26/2015   Procedure: ESOPHAGOGASTRODUODENOSCOPY (EGD);  Surgeon: Paul Y Oh, MD;   Location: [facility];  Service: Gastroenterology;  Laterality: N/A;  Diabetic - oral meds  . KIDNEY SURGERY Right    surgery when 83 years old  . POLYPECTOMY  02/26/2015   Procedure: POLYPECTOMY;  Surgeon: Paul Y Oh, MD;  Location: [facility];  Service: Gastroenterology;;  . sebaceous cyst removal Right    located right of spine on upper back       Home Medications    Prior to Admission medications   Medication Sig Start Date End Date Taking? Authorizing Provider  ACCU-CHEK SOFTCLIX LANCETS lancets TEST BLOOD SUGAR EVERY DAY 09/19/15  Yes Jones, Deanna C, MD  Blood Glucose Monitoring Suppl (ACCU-CHEK AVIVA PLUS) w/Device KIT USE AS DIRECTED 03/11/15  Yes Jones, Deanna C, MD  glucose blood (ACCU-CHEK AVIVA PLUS) test strip 1 each by Other route daily. Use as instructed 08/23/16  Yes Jones, Deanna C, MD  metFORMIN (GLUCOPHAGE) 1000 MG tablet TAKE 1 TABLET BY MOUTH TWICE A DAY 06/08/17  Yes Jones, Deanna C, MD  montelukast (SINGULAIR) 10 MG tablet TAKE 1 TABLET BY MOUTH EVERYDAY AT BEDTIME 06/13/17  Yes Jones, Deanna C, MD  Omega-3 Fatty Acids (FISH OIL) 1000 MG CAPS Take 1 capsule (1,000 mg total) by mouth daily. 04/06/16  Yes Jones, Deanna C, MD  pantoprazole (PROTONIX) 40 MG tablet TAKE 1 TABLET BY MOUTH EVERY DAY 06/13/17  Yes Jones, Deanna C, MD  simvastatin (ZOCOR) 80 MG tablet   TAKE 1 TABLET (80 MG TOTAL) BY MOUTH EVERY EVENING. 05/23/17  Yes Jones, Deanna C, MD    Family History Family History  Problem Relation Age of Onset  . Diabetes Father   . Heart disease Father     Social History Social History   Tobacco Use  . Smoking status: Former Smoker    Last attempt to quit: 03/08/1978    Years since quitting: 39.2  . Smokeless tobacco: Never Used  Substance Use Topics  . Alcohol use: Yes    Alcohol/week: 0.0 oz    Comment: Rarely  . Drug use: No     Allergies   Patient has no known allergies.   Review of Systems Review of Systems  Constitutional: Negative  for chills and fever.  Eyes: Negative.   Respiratory: Negative.   Cardiovascular: Negative.   Gastrointestinal: Negative.   Neurological: Positive for weakness. Negative for syncope.   Physical Exam Triage Vital Signs ED Triage Vitals  Enc Vitals Group     BP 06/14/17 1046 115/66     Pulse Rate 06/14/17 1046 71     Resp 06/14/17 1046 16     Temp 06/14/17 1046 97.7 F (36.5 C)     Temp Source 06/14/17 1046 Oral     SpO2 06/14/17 1046 99 %     Weight 06/14/17 1043 195 lb (88.5 kg)     Height 06/14/17 1043 5' 11" (1.803 m)     Head Circumference --      Peak Flow --      Pain Score 06/14/17 1043 0     Pain Loc --      Pain Edu? --      Excl. in GC? --    Updated Vital Signs BP 115/66 (BP Location: Left Arm)   Pulse 71   Temp 97.7 F (36.5 C) (Oral)   Resp 16   Ht 5' 11" (1.803 m)   Wt 195 lb (88.5 kg)   SpO2 99%   BMI 27.20 kg/m   Physical Exam  Constitutional: He is oriented to person, place, and time. He appears well-developed. No distress.  HENT:  Head: Normocephalic and atraumatic.  Mouth/Throat: Oropharynx is clear and moist.  Eyes: Pupils are equal, round, and reactive to light. Conjunctivae and EOM are normal. Right eye exhibits no discharge. Left eye exhibits no discharge.  Neck: Neck supple.  Cardiovascular: Normal rate and regular rhythm.  Pulmonary/Chest: Effort normal and breath sounds normal. He has no wheezes. He has no rales.  Lymphadenopathy:    He has no cervical adenopathy.  Neurological: He is alert and oriented to person, place, and time. No cranial nerve deficit. He exhibits normal muscle tone. Coordination normal.  Normal muscle strength throughout.  Skin: Skin is warm.  Psychiatric: He has a normal mood and affect. His behavior is normal.  Nursing note and vitals reviewed.  UC Treatments / Results  Labs (all labs ordered are listed, but only abnormal results are displayed) Labs Reviewed  GLUCOSE, CAPILLARY - Abnormal; Notable for the  following components:      Result Value   Glucose-Capillary 163 (*)    All other components within normal limits  CBC WITH DIFFERENTIAL/PLATELET - Abnormal; Notable for the following components:   RBC 4.01 (*)    HCT 38.0 (*)    All other components within normal limits  COMPREHENSIVE METABOLIC PANEL - Abnormal; Notable for the following components:   Glucose, Bld 161 (*)    BUN 30 (*)      Creatinine, Ser 1.60 (*)    Calcium 8.8 (*)    GFR calc non Af Amer 39 (*)    GFR calc Af Amer 45 (*)    All other components within normal limits  CBG MONITORING, ED   EKG None Radiology Ct Head Wo Contrast  Result Date: 06/14/2017 CLINICAL DATA:  Pain following fall.  Unsteady gait EXAM: CT HEAD WITHOUT CONTRAST TECHNIQUE: Contiguous axial images were obtained from the base of the skull through the vertex without intravenous contrast. COMPARISON:  Jul 30, 2016 FINDINGS: Brain: Mild to moderate diffuse atrophy is stable. There is no intracranial mass, hemorrhage, extra-axial fluid collection, or midline shift. There is patchy small vessel disease in the centra semiovale bilaterally. Elsewhere gray-white compartments appear normal. No evident acute infarct. Vascular: There is no hyperdense vessel. There is no appreciable vascular calcification. Skull: Bony calvarium appears intact. There is a tiny posterior right frontal enostosis. There is a small right frontal scalp hematoma just superior to the right orbit medially. Sinuses/Orbits: There is mucosal thickening in several ethmoid air cells. Other visualized paranasal sinuses are clear. Visualized orbits appear unremarkable. Other: Visualized mastoid air cells are clear. There is debris in each external auditory canal. IMPRESSION: Stable atrophy with fairly mild patchy periventricular small vessel disease. No mass or hemorrhage. No evident acute infarct. Small right frontal scalp hematoma.  No fracture. Mucosal thickening in several ethmoid air cells. Probable  cerumen in each external auditory canal. Electronically Signed   By: Lowella Grip III M.D.   On: 06/14/2017 11:58    Procedures Procedures (including critical care time)  Medications Ordered in UC Medications - No data to display   Initial Impression / Assessment and Plan / UC Course  I have reviewed the triage vital signs and the nursing notes.  Pertinent labs & imaging results that were available during my care of the patient were reviewed by me and considered in my medical decision making (see chart for details).    83 year old male presents with generalized weakness.  Also suffered a fall this morning.  CT head negative for acute intracranial process.  He did have a small hematoma.  Labs at baseline.  Advised supportive care and rest.  Tylenol for pain.  Follow-up with primary doctor later this week.  Final Clinical Impressions(s) / UC Diagnoses   Final diagnoses:  Generalized weakness    ED Discharge Orders    None     Controlled Substance Prescriptions [place] Controlled Substance Registry consulted? Not Applicable   Coral Spikes, DO 06/14/17 1239

## 2017-06-14 NOTE — ED Notes (Signed)
Prior Authorization obtained for Head CT Approval [id number] from [facility]

## 2017-06-14 NOTE — ED Triage Notes (Signed)
Patient reports that at 6 am this morning when he was walking to the bathroom he fell down to the floor because he was so weak. Reports that he laid for 15 minutes because he felt like he could not pick himself up of the floor. Patient states that he crawled back to his bed, then his wife woke him up at 9am. Patient reports that he is very unsteady today, weak and hands have a tremor. Patient states that this all new for him. Patient reports that yesterday he had good energy and stayed up watching the ball game with no issues.

## 2017-06-16 ENCOUNTER — Ambulatory Visit (INDEPENDENT_AMBULATORY_CARE_PROVIDER_SITE_OTHER): Payer: Medicare HMO | Admitting: Family Medicine

## 2017-06-16 ENCOUNTER — Encounter: Payer: Self-pay | Admitting: Family Medicine

## 2017-06-16 VITALS — BP 120/62 | HR 72 | Ht 71.0 in | Wt 194.0 lb

## 2017-06-16 DIAGNOSIS — E119 Type 2 diabetes mellitus without complications: Secondary | ICD-10-CM

## 2017-06-16 DIAGNOSIS — R55 Syncope and collapse: Secondary | ICD-10-CM

## 2017-06-16 DIAGNOSIS — E785 Hyperlipidemia, unspecified: Secondary | ICD-10-CM

## 2017-06-16 DIAGNOSIS — K529 Noninfective gastroenteritis and colitis, unspecified: Secondary | ICD-10-CM | POA: Diagnosis not present

## 2017-06-16 DIAGNOSIS — E1169 Type 2 diabetes mellitus with other specified complication: Secondary | ICD-10-CM | POA: Diagnosis not present

## 2017-06-16 DIAGNOSIS — I499 Cardiac arrhythmia, unspecified: Secondary | ICD-10-CM | POA: Diagnosis not present

## 2017-06-16 NOTE — Progress Notes (Signed)
Name: Cody Howard   MRN: [id number]    DOB: August 10, 1934   Date:06/16/2017       Progress Note  Subjective  Chief Complaint  Chief Complaint  Patient presents with  . Follow-up    fell on 06/14/17- was seen in urgent care and had CT of head- was negative except hematoma    Loss of Consciousness  This is a new problem. The current episode started in the past 7 days (tuesday). The problem occurs daily. The problem has been unchanged. He lost consciousness for a period of less than 1 minute. Associated symptoms include bowel incontinence, diaphoresis, dizziness, light-headedness, vertigo and a visual change. Pertinent negatives include no abdominal pain, aura, back pain, bladder incontinence, chest pain, confusion, fever, focal sensory loss, focal weakness, headaches, malaise/fatigue, nausea, palpitations, slurred speech, vomiting or weakness. Associated symptoms comments: Positional dizziness. He has tried nothing for the symptoms. There is no history of arrhythmia, CAD, a clotting disorder, CVA, DM, HTN, seizures, a sudden death in family, TIA or vertigo.    No problem-specific Assessment & Plan notes found for this encounter.   Past Medical History:  Diagnosis Date  . Arthritis    hands  . Chronic kidney disease   . Diabetes mellitus without complication ([place])   . Hyperlipidemia   . Prostate enlargement     Past Surgical History:  Procedure Laterality Date  . COLONOSCOPY N/A 02/26/2015   Procedure: COLONOSCOPY;  Surgeon: Hulen Luster, MD;  Location: [place];  Service: Gastroenterology;  Laterality: N/A;  . ESOPHAGOGASTRODUODENOSCOPY N/A 02/26/2015   Procedure: ESOPHAGOGASTRODUODENOSCOPY (EGD);  Surgeon: Hulen Luster, MD;  Location: [place];  Service: Gastroenterology;  Laterality: N/A;  Diabetic - oral meds  . KIDNEY SURGERY Right    surgery when 83 years old  . POLYPECTOMY  02/26/2015   Procedure: POLYPECTOMY;  Surgeon: Hulen Luster, MD;  Location: [place];  Service: Gastroenterology;;  . sebaceous cyst removal Right    located right of spine on upper back    Family History  Problem Relation Age of Onset  . Diabetes Father   . Heart disease Father     Social History   Socioeconomic History  . Marital status: Married    Spouse name: Not on file  . Number of children: Not on file  . Years of education: Not on file  . Highest education level: Not on file  Occupational History  . Not on file  Social Needs  . Financial resource strain: Not on file  . Food insecurity:    Worry: Not on file    Inability: Not on file  . Transportation needs:    Medical: Not on file    Non-medical: Not on file  Tobacco Use  . Smoking status: Former Smoker    Last attempt to quit: 03/08/1978    Years since quitting: 39.3  . Smokeless tobacco: Never Used  Substance and Sexual Activity  . Alcohol use: Yes    Alcohol/week: 0.0 oz    Comment: Rarely  . Drug use: No  . Sexual activity: Not Currently  Lifestyle  . Physical activity:    Days per week: Not on file    Minutes per session: Not on file  . Stress: Not on file  Relationships  . Social connections:    Talks on phone: Not on file    Gets together: Not on file    Attends religious service: Not on file    Active  member of club or organization: Not on file    Attends meetings of clubs or organizations: Not on file    Relationship status: Not on file  . Intimate partner violence:    Fear of current or ex partner: Not on file    Emotionally abused: Not on file    Physically abused: Not on file    Forced sexual activity: Not on file  Other Topics Concern  . Not on file  Social History Narrative  . Not on file    No Known Allergies  Outpatient Medications Prior to Visit  Medication Sig Dispense Refill  . ACCU-CHEK SOFTCLIX LANCETS lancets TEST BLOOD SUGAR EVERY DAY 100 each 0  . Blood Glucose Monitoring Suppl (ACCU-CHEK AVIVA PLUS) w/Device KIT USE AS DIRECTED 1 kit 0  .  glucose blood (ACCU-CHEK AVIVA PLUS) test strip 1 each by Other route daily. Use as instructed 100 each 1  . metFORMIN (GLUCOPHAGE) 1000 MG tablet TAKE 1 TABLET BY MOUTH TWICE A DAY 60 tablet 0  . montelukast (SINGULAIR) 10 MG tablet TAKE 1 TABLET BY MOUTH EVERYDAY AT BEDTIME 30 tablet 0  . Omega-3 Fatty Acids (FISH OIL) 1000 MG CAPS Take 1 capsule (1,000 mg total) by mouth daily. 90 capsule 3  . pantoprazole (PROTONIX) 40 MG tablet TAKE 1 TABLET BY MOUTH EVERY DAY 30 tablet 0  . simvastatin (ZOCOR) 80 MG tablet TAKE 1 TABLET (80 MG TOTAL) BY MOUTH EVERY EVENING. 30 tablet 0  . tamsulosin (FLOMAX) 0.4 MG CAPS capsule Take 1 capsule by mouth daily.     No facility-administered medications prior to visit.     Review of Systems  Constitutional: Positive for diaphoresis. Negative for chills, fever, malaise/fatigue and weight loss.  HENT: Negative for ear discharge, ear pain and sore throat.   Eyes: Negative for blurred vision.  Respiratory: Negative for cough, sputum production, shortness of breath and wheezing.   Cardiovascular: Positive for syncope. Negative for chest pain, palpitations and leg swelling.  Gastrointestinal: Positive for bowel incontinence. Negative for abdominal pain, blood in stool, constipation, diarrhea, heartburn, melena, nausea and vomiting.  Genitourinary: Negative for bladder incontinence, dysuria, frequency, hematuria and urgency.  Musculoskeletal: Negative for back pain, joint pain, myalgias and neck pain.  Skin: Negative for rash.  Neurological: Positive for dizziness, vertigo and light-headedness. Negative for tingling, sensory change, focal weakness, weakness and headaches.  Endo/Heme/Allergies: Negative for environmental allergies and polydipsia. Does not bruise/bleed easily.  Psychiatric/Behavioral: Negative for confusion, depression and suicidal ideas. The patient is not nervous/anxious and does not have insomnia.      Objective  Vitals:   06/16/17 1538   BP: 120/62  Pulse: 72  Weight: 194 lb (88 kg)  Height: '5\' 11"'  (1.803 m)    Physical Exam  Constitutional: He is oriented to person, place, and time.  HENT:  Head: Normocephalic.  Right Ear: External ear normal.  Left Ear: External ear normal.  Nose: Nose normal.  Mouth/Throat: Oropharynx is clear and moist.  Eyes: Pupils are equal, round, and reactive to light. Conjunctivae and EOM are normal. Right eye exhibits no discharge. Left eye exhibits no discharge. No scleral icterus.  Neck: Normal range of motion. Neck supple. No JVD present. No tracheal deviation present. No thyromegaly present.  Cardiovascular: Normal rate, regular rhythm, normal heart sounds and intact distal pulses. Exam reveals no gallop and no friction rub.  No murmur heard. Pulmonary/Chest: Breath sounds normal. No respiratory distress. He has no wheezes. He has no rales.  Abdominal:  Soft. Bowel sounds are normal. He exhibits no mass. There is no hepatosplenomegaly. There is no tenderness. There is no rebound, no guarding and no CVA tenderness.  Musculoskeletal: Normal range of motion. He exhibits no edema or tenderness.  Lymphadenopathy:    He has no cervical adenopathy.  Neurological: He is alert and oriented to person, place, and time. He has normal strength and normal reflexes. No cranial nerve deficit. He exhibits normal muscle tone.  Skin: Skin is warm. No rash noted.  Nursing note and vitals reviewed.     Assessment & Plan  Problem List Items Addressed This Visit      Endocrine   Type 2 diabetes mellitus without complication, without long-term current use of insulin ([place])   Hyperlipidemia associated with type 2 diabetes mellitus ([place])    Other Visit Diagnoses    Irregular heartbeat    -  Primary   tachyrhymia initially    Relevant Orders   EKG 12-Lead (Completed)   Ambulatory referral to Cardiology   Syncope, unspecified syncope type       Relevant Orders   Ambulatory referral to Cardiology    Enteritis       suggest imodium      No orders of the defined types were placed in this encounter.     Dr. [facility]  06/16/17

## 2017-06-20 DIAGNOSIS — E119 Type 2 diabetes mellitus without complications: Secondary | ICD-10-CM | POA: Diagnosis not present

## 2017-06-20 DIAGNOSIS — R42 Dizziness and giddiness: Secondary | ICD-10-CM | POA: Diagnosis not present

## 2017-06-20 DIAGNOSIS — E782 Mixed hyperlipidemia: Secondary | ICD-10-CM | POA: Insufficient documentation

## 2017-06-20 DIAGNOSIS — R0602 Shortness of breath: Secondary | ICD-10-CM | POA: Diagnosis not present

## 2017-06-28 DIAGNOSIS — R42 Dizziness and giddiness: Secondary | ICD-10-CM | POA: Diagnosis not present

## 2017-07-05 DIAGNOSIS — R0602 Shortness of breath: Secondary | ICD-10-CM | POA: Diagnosis not present

## 2017-07-05 DIAGNOSIS — E782 Mixed hyperlipidemia: Secondary | ICD-10-CM | POA: Diagnosis not present

## 2017-07-05 DIAGNOSIS — E785 Hyperlipidemia, unspecified: Secondary | ICD-10-CM | POA: Diagnosis not present

## 2017-07-05 DIAGNOSIS — E119 Type 2 diabetes mellitus without complications: Secondary | ICD-10-CM | POA: Diagnosis not present

## 2017-07-06 DIAGNOSIS — E782 Mixed hyperlipidemia: Secondary | ICD-10-CM | POA: Diagnosis not present

## 2017-07-06 DIAGNOSIS — I420 Dilated cardiomyopathy: Secondary | ICD-10-CM | POA: Diagnosis not present

## 2017-07-06 DIAGNOSIS — F458 Other somatoform disorders: Secondary | ICD-10-CM | POA: Insufficient documentation

## 2017-07-06 DIAGNOSIS — R0602 Shortness of breath: Secondary | ICD-10-CM | POA: Diagnosis not present

## 2017-07-06 DIAGNOSIS — R69 Illness, unspecified: Secondary | ICD-10-CM | POA: Diagnosis not present

## 2017-07-06 DIAGNOSIS — E119 Type 2 diabetes mellitus without complications: Secondary | ICD-10-CM | POA: Diagnosis not present

## 2017-07-06 DIAGNOSIS — I429 Cardiomyopathy, unspecified: Secondary | ICD-10-CM | POA: Insufficient documentation

## 2017-07-06 DIAGNOSIS — R42 Dizziness and giddiness: Secondary | ICD-10-CM | POA: Diagnosis not present

## 2017-07-08 ENCOUNTER — Other Ambulatory Visit: Payer: Self-pay | Admitting: Family Medicine

## 2017-07-08 DIAGNOSIS — N401 Enlarged prostate with lower urinary tract symptoms: Secondary | ICD-10-CM

## 2017-07-08 DIAGNOSIS — E1169 Type 2 diabetes mellitus with other specified complication: Secondary | ICD-10-CM

## 2017-07-08 DIAGNOSIS — E785 Hyperlipidemia, unspecified: Secondary | ICD-10-CM

## 2017-07-12 ENCOUNTER — Other Ambulatory Visit: Payer: Self-pay | Admitting: Family Medicine

## 2017-07-12 DIAGNOSIS — J0100 Acute maxillary sinusitis, unspecified: Secondary | ICD-10-CM

## 2017-07-12 DIAGNOSIS — E119 Type 2 diabetes mellitus without complications: Secondary | ICD-10-CM

## 2017-07-12 DIAGNOSIS — R1013 Epigastric pain: Secondary | ICD-10-CM

## 2017-07-12 DIAGNOSIS — J302 Other seasonal allergic rhinitis: Secondary | ICD-10-CM

## 2017-07-19 ENCOUNTER — Other Ambulatory Visit: Payer: Medicare HMO

## 2017-07-19 ENCOUNTER — Other Ambulatory Visit: Payer: Self-pay

## 2017-07-19 DIAGNOSIS — E119 Type 2 diabetes mellitus without complications: Secondary | ICD-10-CM

## 2017-07-19 DIAGNOSIS — E782 Mixed hyperlipidemia: Secondary | ICD-10-CM | POA: Diagnosis not present

## 2017-07-20 LAB — LIPID PANEL WITH LDL/HDL RATIO
Cholesterol, Total: 141 mg/dL (ref 100–199)
HDL: 58 mg/dL (ref >39)
LDL Calculated: 72 mg/dL (ref 0–99)
LDL/HDL RATIO: 1.2 ratio (ref 0.0–3.6)
Triglycerides: 56 mg/dL (ref 0–149)
VLDL CHOLESTEROL CAL: 11 mg/dL (ref 5–40)

## 2017-07-20 LAB — HEMOGLOBIN A1C
ESTIMATED AVERAGE GLUCOSE: 148 mg/dL
HEMOGLOBIN A1C: 6.8 % — AB (ref 4.8–5.6)

## 2017-07-21 ENCOUNTER — Other Ambulatory Visit: Payer: Self-pay

## 2017-07-21 DIAGNOSIS — I4892 Unspecified atrial flutter: Secondary | ICD-10-CM | POA: Insufficient documentation

## 2017-07-21 DIAGNOSIS — R0602 Shortness of breath: Secondary | ICD-10-CM | POA: Diagnosis not present

## 2017-07-21 DIAGNOSIS — I420 Dilated cardiomyopathy: Secondary | ICD-10-CM | POA: Diagnosis not present

## 2017-07-21 DIAGNOSIS — I440 Atrioventricular block, first degree: Secondary | ICD-10-CM | POA: Diagnosis not present

## 2017-07-21 DIAGNOSIS — E782 Mixed hyperlipidemia: Secondary | ICD-10-CM | POA: Diagnosis not present

## 2017-07-21 DIAGNOSIS — R69 Illness, unspecified: Secondary | ICD-10-CM | POA: Diagnosis not present

## 2017-08-10 ENCOUNTER — Other Ambulatory Visit: Payer: Self-pay | Admitting: Family Medicine

## 2017-08-10 DIAGNOSIS — E785 Hyperlipidemia, unspecified: Secondary | ICD-10-CM

## 2017-08-10 DIAGNOSIS — N401 Enlarged prostate with lower urinary tract symptoms: Secondary | ICD-10-CM

## 2017-08-10 DIAGNOSIS — E1169 Type 2 diabetes mellitus with other specified complication: Secondary | ICD-10-CM

## 2017-08-18 DIAGNOSIS — N183 Chronic kidney disease, stage 3 unspecified: Secondary | ICD-10-CM | POA: Insufficient documentation

## 2017-08-18 DIAGNOSIS — R001 Bradycardia, unspecified: Secondary | ICD-10-CM | POA: Insufficient documentation

## 2017-08-18 DIAGNOSIS — I420 Dilated cardiomyopathy: Secondary | ICD-10-CM | POA: Diagnosis not present

## 2017-08-18 DIAGNOSIS — I480 Paroxysmal atrial fibrillation: Secondary | ICD-10-CM | POA: Diagnosis not present

## 2017-08-18 DIAGNOSIS — E782 Mixed hyperlipidemia: Secondary | ICD-10-CM | POA: Diagnosis not present

## 2017-08-23 ENCOUNTER — Telehealth: Payer: Self-pay | Admitting: Family Medicine

## 2017-08-23 NOTE — Telephone Encounter (Signed)
Called to schedule Medicare Annual Wellness Visit with Nurse Health Advisor. If patient returns call, please note: their last AWV was on 12/5/ /16 please schedule AWV with NHA any date  Thank you! For any questions please contact: Jill Alexanders [phone]  Or Skype me at: [email or web address]

## 2017-08-29 NOTE — Telephone Encounter (Signed)
2ND ATTEMPT

## 2017-08-31 ENCOUNTER — Other Ambulatory Visit: Payer: Self-pay | Admitting: Family Medicine

## 2017-08-31 DIAGNOSIS — R059 Cough, unspecified: Secondary | ICD-10-CM

## 2017-08-31 DIAGNOSIS — R05 Cough: Secondary | ICD-10-CM

## 2017-09-04 ENCOUNTER — Other Ambulatory Visit: Payer: Self-pay | Admitting: Family Medicine

## 2017-09-04 DIAGNOSIS — E1169 Type 2 diabetes mellitus with other specified complication: Secondary | ICD-10-CM

## 2017-09-04 DIAGNOSIS — E785 Hyperlipidemia, unspecified: Secondary | ICD-10-CM

## 2017-09-04 DIAGNOSIS — N401 Enlarged prostate with lower urinary tract symptoms: Secondary | ICD-10-CM

## 2017-09-06 ENCOUNTER — Ambulatory Visit (INDEPENDENT_AMBULATORY_CARE_PROVIDER_SITE_OTHER): Payer: Medicare HMO | Admitting: Family Medicine

## 2017-09-06 ENCOUNTER — Encounter: Payer: Self-pay | Admitting: Family Medicine

## 2017-09-06 ENCOUNTER — Other Ambulatory Visit: Payer: Self-pay

## 2017-09-06 VITALS — BP 100/54 | HR 64 | Temp 98.2°F | Ht 71.0 in | Wt 190.0 lb

## 2017-09-06 DIAGNOSIS — J029 Acute pharyngitis, unspecified: Secondary | ICD-10-CM | POA: Diagnosis not present

## 2017-09-06 MED ORDER — MONTELUKAST SODIUM 10 MG PO TABS
10.0000 mg | ORAL_TABLET | Freq: Every day | ORAL | 3 refills | Status: DC
Start: 2017-09-06 — End: 2017-12-01

## 2017-09-06 NOTE — Progress Notes (Signed)
Name: Cody Howard   MRN: [id number]    DOB: 04/14/1934   Date:09/06/2017       Progress Note  Subjective  Chief Complaint  Chief Complaint  Patient presents with  . Sore Throat    cough and cong- not productive    Sore Throat   This is a new problem. The current episode started yesterday. The problem has been gradually worsening. There has been no fever. The pain is moderate. Associated symptoms include congestion, coughing, a hoarse voice and swollen glands. Pertinent negatives include no abdominal pain, diarrhea, ear discharge, ear pain, headaches, neck pain or shortness of breath. He has had no exposure to strep or mono. He has tried acetaminophen for the symptoms. The treatment provided mild relief.    No problem-specific Assessment & Plan notes found for this encounter.   Past Medical History:  Diagnosis Date  . Arthritis    hands  . Chronic kidney disease   . Diabetes mellitus without complication ([place])   . Hyperlipidemia   . Prostate enlargement     Past Surgical History:  Procedure Laterality Date  . COLONOSCOPY N/A 02/26/2015   Procedure: COLONOSCOPY;  Surgeon: Hulen Luster, MD;  Location: [place];  Service: Gastroenterology;  Laterality: N/A;  . ESOPHAGOGASTRODUODENOSCOPY N/A 02/26/2015   Procedure: ESOPHAGOGASTRODUODENOSCOPY (EGD);  Surgeon: Hulen Luster, MD;  Location: [place];  Service: Gastroenterology;  Laterality: N/A;  Diabetic - oral meds  . KIDNEY SURGERY Right    surgery when 83 years old  . POLYPECTOMY  02/26/2015   Procedure: POLYPECTOMY;  Surgeon: Hulen Luster, MD;  Location: [place];  Service: Gastroenterology;;  . sebaceous cyst removal Right    located right of spine on upper back    Family History  Problem Relation Age of Onset  . Diabetes Father   . Heart disease Father     Social History   Socioeconomic History  . Marital status: Married    Spouse name: Not on file  . Number of children: Not on file  .  Years of education: Not on file  . Highest education level: Not on file  Occupational History  . Not on file  Social Needs  . Financial resource strain: Not on file  . Food insecurity:    Worry: Not on file    Inability: Not on file  . Transportation needs:    Medical: Not on file    Non-medical: Not on file  Tobacco Use  . Smoking status: Former Smoker    Last attempt to quit: 03/08/1978    Years since quitting: 39.5  . Smokeless tobacco: Never Used  Substance and Sexual Activity  . Alcohol use: Yes    Alcohol/week: 0.0 oz    Comment: Rarely  . Drug use: No  . Sexual activity: Not Currently  Lifestyle  . Physical activity:    Days per week: Not on file    Minutes per session: Not on file  . Stress: Not on file  Relationships  . Social connections:    Talks on phone: Not on file    Gets together: Not on file    Attends religious service: Not on file    Active member of club or organization: Not on file    Attends meetings of clubs or organizations: Not on file    Relationship status: Not on file  . Intimate partner violence:    Fear of current or ex partner: Not on file  Emotionally abused: Not on file    Physically abused: Not on file    Forced sexual activity: Not on file  Other Topics Concern  . Not on file  Social History Narrative  . Not on file    No Known Allergies  Outpatient Medications Prior to Visit  Medication Sig Dispense Refill  . ACCU-CHEK SOFTCLIX LANCETS lancets TEST BLOOD SUGAR EVERY DAY 100 each 0  . Blood Glucose Monitoring Suppl (ACCU-CHEK AVIVA PLUS) w/Device KIT USE AS DIRECTED 1 kit 0  . glucose blood (ACCU-CHEK AVIVA PLUS) test strip 1 each by Other route daily. Use as instructed 100 each 1  . metFORMIN (GLUCOPHAGE) 1000 MG tablet TAKE 1 TABLET BY MOUTH TWICE A DAY 60 tablet 3  . Omega-3 Fatty Acids (FISH OIL) 1000 MG CAPS Take 1 capsule (1,000 mg total) by mouth daily. 90 capsule 3  . pantoprazole (PROTONIX) 40 MG tablet TAKE 1 TABLET  BY MOUTH EVERY DAY 30 tablet 3  . simvastatin (ZOCOR) 80 MG tablet TAKE 1 TABLET (80 MG TOTAL) BY MOUTH EVERY EVENING. 30 tablet 1  . tamsulosin (FLOMAX) 0.4 MG CAPS capsule Take 1 capsule by mouth daily.    . montelukast (SINGULAIR) 10 MG tablet TAKE 1 TABLET BY MOUTH EVERYDAY AT BEDTIME 30 tablet 3  . tamsulosin (FLOMAX) 0.4 MG CAPS capsule TAKE 1 CAPSULE BY MOUTH EVERY DAY. NEEDS APPT FOR REFILLS 30 capsule 1  . VENTOLIN HFA 108 (90 Base) MCG/ACT inhaler INHALE 2 PUFFS INTO THE LUNGS EVERY 6 (SIX) HOURS AS NEEDED FOR WHEEZING OR SHORTNESS OF BREATH. 18 Inhaler 0   No facility-administered medications prior to visit.     Review of Systems  Constitutional: Negative for chills, fever, malaise/fatigue and weight loss.  HENT: Positive for congestion and hoarse voice. Negative for ear discharge, ear pain and sore throat.   Eyes: Negative for blurred vision.  Respiratory: Positive for cough. Negative for sputum production, shortness of breath and wheezing.   Cardiovascular: Negative for chest pain, palpitations and leg swelling.  Gastrointestinal: Negative for abdominal pain, blood in stool, constipation, diarrhea, heartburn, melena and nausea.  Genitourinary: Negative for dysuria, frequency, hematuria and urgency.  Musculoskeletal: Negative for back pain, joint pain, myalgias and neck pain.  Skin: Negative for rash.  Neurological: Negative for dizziness, tingling, sensory change, focal weakness and headaches.  Endo/Heme/Allergies: Negative for environmental allergies and polydipsia. Does not bruise/bleed easily.  Psychiatric/Behavioral: Negative for depression and suicidal ideas. The patient is not nervous/anxious and does not have insomnia.      Objective  Vitals:   09/06/17 1559  BP: (!) 100/54  Pulse: 64  Temp: 98.2 F (36.8 C)  TempSrc: Oral  Weight: 190 lb (86.2 kg)  Height: '5\' 11"'  (1.803 m)    Physical Exam  Constitutional: He is oriented to person, place, and time.  HENT:   Head: Normocephalic.  Right Ear: Hearing, tympanic membrane, external ear and ear canal normal.  Left Ear: Hearing, tympanic membrane, external ear and ear canal normal.  Nose: Nose normal.  Mouth/Throat: Mucous membranes are normal. Posterior oropharyngeal erythema present. No oropharyngeal exudate or posterior oropharyngeal edema.  Eyes: Pupils are equal, round, and reactive to light. Conjunctivae and EOM are normal. Right eye exhibits no discharge. Left eye exhibits no discharge. No scleral icterus.  Neck: Normal range of motion. Neck supple. No JVD present. No tracheal deviation present. No thyromegaly present.  Cardiovascular: Normal rate, regular rhythm, normal heart sounds and intact distal pulses. Exam reveals no gallop and  no friction rub.  No murmur heard. Pulmonary/Chest: Breath sounds normal. No respiratory distress. He has no wheezes. He has no rales.  Abdominal: Soft. Bowel sounds are normal. He exhibits no mass. There is no hepatosplenomegaly. There is no tenderness. There is no rebound, no guarding and no CVA tenderness.  Musculoskeletal: Normal range of motion. He exhibits no edema or tenderness.  Lymphadenopathy:    He has no cervical adenopathy.  Neurological: He is alert and oriented to person, place, and time. He has normal strength and normal reflexes. No cranial nerve deficit.  Skin: Skin is warm. No rash noted.  Nursing note and vitals reviewed.     Assessment & Plan  Problem List Items Addressed This Visit    None    Visit Diagnoses    Viral pharyngitis    -  Primary   start taking singulair- pt will call if no better   Relevant Medications   montelukast (SINGULAIR) 10 MG tablet      Meds ordered this encounter  Medications  . montelukast (SINGULAIR) 10 MG tablet    Sig: Take 1 tablet (10 mg total) by mouth at bedtime.    Dispense:  30 tablet    Refill:  3      Dr. [facility]  09/06/17

## 2017-09-12 ENCOUNTER — Encounter: Payer: Self-pay | Admitting: Family Medicine

## 2017-09-12 ENCOUNTER — Ambulatory Visit (INDEPENDENT_AMBULATORY_CARE_PROVIDER_SITE_OTHER): Payer: Medicare HMO | Admitting: Family Medicine

## 2017-09-12 VITALS — BP 130/70 | HR 60 | Temp 98.1°F | Ht 71.0 in | Wt 184.0 lb

## 2017-09-12 DIAGNOSIS — R5383 Other fatigue: Secondary | ICD-10-CM | POA: Diagnosis not present

## 2017-09-12 DIAGNOSIS — R05 Cough: Secondary | ICD-10-CM

## 2017-09-12 DIAGNOSIS — E860 Dehydration: Secondary | ICD-10-CM

## 2017-09-12 DIAGNOSIS — R5381 Other malaise: Secondary | ICD-10-CM

## 2017-09-12 DIAGNOSIS — R059 Cough, unspecified: Secondary | ICD-10-CM

## 2017-09-12 MED ORDER — BENZONATATE 100 MG PO CAPS: 100.0000 mg | ORAL_CAPSULE | Freq: Two times a day (BID) | ORAL | 0 refills | Status: DC | PRN

## 2017-09-12 NOTE — Progress Notes (Signed)
Name: Cody Howard   MRN: [id number]    DOB: 1934/07/26   Date:09/12/2017       Progress Note  Subjective  Chief Complaint  Chief Complaint  Patient presents with  . Sinusitis    follow up- throat is better, cong and cough- on day 3 of ZPack    Cough  This is a new problem. The current episode started in the past 7 days. The problem has been gradually improving. The problem occurs every few minutes. The cough is non-productive. Associated symptoms include heartburn and postnasal drip. Pertinent negatives include no chest pain, chills, ear congestion, ear pain, fever, headaches, hemoptysis, myalgias, nasal congestion, rash, rhinorrhea, sore throat, shortness of breath, sweats, weight loss or wheezing. Nothing aggravates the symptoms. He has tried OTC cough suppressant (mucinex dm) for the symptoms. The treatment provided mild relief. There is no history of environmental allergies.    No problem-specific Assessment & Plan notes found for this encounter.   Past Medical History:  Diagnosis Date  . Arthritis    hands  . Chronic kidney disease   . Diabetes mellitus without complication ([place])   . Hyperlipidemia   . Prostate enlargement     Past Surgical History:  Procedure Laterality Date  . COLONOSCOPY N/A 02/26/2015   Procedure: COLONOSCOPY;  Surgeon: Hulen Luster, MD;  Location: [place];  Service: Gastroenterology;  Laterality: N/A;  . ESOPHAGOGASTRODUODENOSCOPY N/A 02/26/2015   Procedure: ESOPHAGOGASTRODUODENOSCOPY (EGD);  Surgeon: Hulen Luster, MD;  Location: [place];  Service: Gastroenterology;  Laterality: N/A;  Diabetic - oral meds  . KIDNEY SURGERY Right    surgery when 83 years old  . POLYPECTOMY  02/26/2015   Procedure: POLYPECTOMY;  Surgeon: Hulen Luster, MD;  Location: [place];  Service: Gastroenterology;;  . sebaceous cyst removal Right    located right of spine on upper back    Family History  Problem Relation Age of Onset  . Diabetes  Father   . Heart disease Father     Social History   Socioeconomic History  . Marital status: Married    Spouse name: Not on file  . Number of children: Not on file  . Years of education: Not on file  . Highest education level: Not on file  Occupational History  . Not on file  Social Needs  . Financial resource strain: Not on file  . Food insecurity:    Worry: Not on file    Inability: Not on file  . Transportation needs:    Medical: Not on file    Non-medical: Not on file  Tobacco Use  . Smoking status: Former Smoker    Last attempt to quit: 03/08/1978    Years since quitting: 39.5  . Smokeless tobacco: Never Used  Substance and Sexual Activity  . Alcohol use: Yes    Alcohol/week: 0.0 oz    Comment: Rarely  . Drug use: No  . Sexual activity: Not Currently  Lifestyle  . Physical activity:    Days per week: Not on file    Minutes per session: Not on file  . Stress: Not on file  Relationships  . Social connections:    Talks on phone: Not on file    Gets together: Not on file    Attends religious service: Not on file    Active member of club or organization: Not on file    Attends meetings of clubs or organizations: Not on file    Relationship  status: Not on file  . Intimate partner violence:    Fear of current or ex partner: Not on file    Emotionally abused: Not on file    Physically abused: Not on file    Forced sexual activity: Not on file  Other Topics Concern  . Not on file  Social History Narrative  . Not on file    No Known Allergies  Outpatient Medications Prior to Visit  Medication Sig Dispense Refill  . ACCU-CHEK SOFTCLIX LANCETS lancets TEST BLOOD SUGAR EVERY DAY 100 each 0  . Blood Glucose Monitoring Suppl (ACCU-CHEK AVIVA PLUS) w/Device KIT USE AS DIRECTED 1 kit 0  . glucose blood (ACCU-CHEK AVIVA PLUS) test strip 1 each by Other route daily. Use as instructed 100 each 1  . metFORMIN (GLUCOPHAGE) 1000 MG tablet TAKE 1 TABLET BY MOUTH TWICE A  DAY 60 tablet 3  . montelukast (SINGULAIR) 10 MG tablet Take 1 tablet (10 mg total) by mouth at bedtime. 30 tablet 3  . Omega-3 Fatty Acids (FISH OIL) 1000 MG CAPS Take 1 capsule (1,000 mg total) by mouth daily. 90 capsule 3  . pantoprazole (PROTONIX) 40 MG tablet TAKE 1 TABLET BY MOUTH EVERY DAY 30 tablet 3  . simvastatin (ZOCOR) 80 MG tablet TAKE 1 TABLET (80 MG TOTAL) BY MOUTH EVERY EVENING. 30 tablet 1  . tamsulosin (FLOMAX) 0.4 MG CAPS capsule Take 1 capsule by mouth daily.     No facility-administered medications prior to visit.     Review of Systems  Constitutional: Negative for chills, fever, malaise/fatigue and weight loss.  HENT: Positive for congestion and postnasal drip. Negative for ear discharge, ear pain, rhinorrhea and sore throat.   Eyes: Negative for blurred vision.  Respiratory: Positive for cough. Negative for hemoptysis, sputum production, shortness of breath and wheezing.   Cardiovascular: Negative for chest pain, palpitations, orthopnea, claudication, leg swelling and PND.  Gastrointestinal: Positive for heartburn. Negative for abdominal pain, blood in stool, constipation, diarrhea, melena and nausea.  Genitourinary: Negative for dysuria, frequency, hematuria and urgency.  Musculoskeletal: Negative for back pain, joint pain, myalgias and neck pain.  Skin: Negative for rash.  Neurological: Negative for dizziness, tingling, sensory change, focal weakness and headaches.  Endo/Heme/Allergies: Negative for environmental allergies and polydipsia. Does not bruise/bleed easily.  Psychiatric/Behavioral: Negative for depression and suicidal ideas. The patient is not nervous/anxious and does not have insomnia.      Objective  Vitals:   09/12/17 1106  BP: 130/70  Pulse: 60  Temp: 98.1 F (36.7 C)  TempSrc: Oral  SpO2: 98%  Weight: 184 lb (83.5 kg)  Height: _0  (1.803 m)    Physical Exam  Constitutional: He is oriented to person, place, and time.  HENT:  Head:  Normocephalic.  Right Ear: External ear normal.  Left Ear: External ear normal.  Nose: Nose normal.  Mouth/Throat: Oropharynx is clear and moist.  Eyes: Pupils are equal, round, and reactive to light. Conjunctivae and EOM are normal. Right eye exhibits no discharge. Left eye exhibits no discharge. No scleral icterus.  Neck: Normal range of motion. Neck supple. No JVD present. No tracheal deviation present. No thyromegaly present.  Cardiovascular: Normal rate, regular rhythm, normal heart sounds and intact distal pulses. Exam reveals no gallop and no friction rub.  No murmur heard. Pulmonary/Chest: Breath sounds normal. No respiratory distress. He has no wheezes. He has no rales.  Abdominal: Soft. Bowel sounds are normal. He exhibits no mass. There is no hepatosplenomegaly. There is  no tenderness. There is no rebound, no guarding and no CVA tenderness.  Musculoskeletal: Normal range of motion. He exhibits no edema or tenderness.  Lymphadenopathy:    He has no cervical adenopathy.  Neurological: He is alert and oriented to person, place, and time. He has normal strength and normal reflexes. No cranial nerve deficit.  Skin: Skin is warm. No rash noted. There is pallor.  Nursing note and vitals reviewed.     Assessment & Plan  Problem List Items Addressed This Visit    None    Visit Diagnoses    Malaise and fatigue    -  Primary   draw a cbc   Relevant Orders   CBC   Dehydration       told to push fluids    Cough       send in Tessalon Perles for cough   Relevant Medications   benzonatate (TESSALON) 100 MG capsule      Meds ordered this encounter  Medications  . benzonatate (TESSALON) 100 MG capsule    Sig: Take 1 capsule (100 mg total) by mouth 2 (two) times daily as needed for cough.    Dispense:  20 capsule    Refill:  0      Dr. Otilio Miu [facility]  09/12/17

## 2017-09-12 NOTE — Patient Instructions (Signed)
Dehydration, Adult Dehydration is a condition in which there is not enough fluid or water in the body. This happens when you lose more fluids than you take in. Important organs, such as the kidneys, brain, and heart, cannot function without a proper amount of fluids. Any loss of fluids from the body can lead to dehydration. Dehydration can range from mild to severe. This condition should be treated right away to prevent it from becoming severe. What are the causes? This condition may be caused by:  Vomiting.  Diarrhea.  Excessive sweating, such as from heat exposure or exercise.  Not drinking enough fluid, especially: ? When ill. ? While doing activity that requires a lot of energy.  Excessive urination.  Fever.  Infection.  Certain medicines, such as medicines that cause the body to lose excess fluid (diuretics).  Inability to access safe drinking water.  Reduced physical ability to get adequate water and food.  What increases the risk? This condition is more likely to develop in people:  Who have a poorly controlled long-term (chronic) illness, such as diabetes, heart disease, or kidney disease.  Who are age 65 or older.  Who are disabled.  Who live in a place with high altitude.  Who play endurance sports.  What are the signs or symptoms? Symptoms of mild dehydration may include:  Thirst.  Dry lips.  Slightly dry mouth.  Dry, warm skin.  Dizziness. Symptoms of moderate dehydration may include:  Very dry mouth.  Muscle cramps.  Dark urine. Urine may be the color of tea.  Decreased urine production.  Decreased tear production.  Heartbeat that is irregular or faster than normal (palpitations).  Headache.  Light-headedness, especially when you stand up from a sitting position.  Fainting (syncope). Symptoms of severe dehydration may include:  Changes in skin, such as: ? Cold and clammy skin. ? Blotchy (mottled) or pale skin. ? Skin that does  not quickly return to normal after being lightly pinched and released (poor skin turgor).  Changes in body fluids, such as: ? Extreme thirst. ? No tear production. ? Inability to sweat when body temperature is high, such as in hot weather. ? Very little urine production.  Changes in vital signs, such as: ? Weak pulse. ? Pulse that is more than 100 beats a minute when sitting still. ? Rapid breathing. ? Low blood pressure.  Other changes, such as: ? Sunken eyes. ? Cold hands and feet. ? Confusion. ? Lack of energy (lethargy). ? Difficulty waking up from sleep. ? Short-term weight loss. ? Unconsciousness. How is this diagnosed? This condition is diagnosed based on your symptoms and a physical exam. Blood and urine tests may be done to help confirm the diagnosis. How is this treated? Treatment for this condition depends on the severity. Mild or moderate dehydration can often be treated at home. Treatment should be started right away. Do not wait until dehydration becomes severe. Severe dehydration is an emergency and it needs to be treated in a hospital. Treatment for mild dehydration may include:  Drinking more fluids.  Replacing salts and minerals in your blood (electrolytes) that you may have lost. Treatment for moderate dehydration may include:  Drinking an oral rehydration solution (ORS). This is a drink that helps you replace fluids and electrolytes (rehydrate). It can be found at pharmacies and retail stores. Treatment for severe dehydration may include:  Receiving fluids through an IV tube.  Receiving an electrolyte solution through a feeding tube that is passed through your nose   and into your stomach (nasogastric tube, or NG tube).  Correcting any abnormalities in electrolytes.  Treating the underlying cause of dehydration. Follow these instructions at home:  If directed by your health care provider, drink an ORS: ? Make an ORS by following instructions on the  package. ? Start by drinking small amounts, about  cup (120 mL) every 5-10 minutes. ? Slowly increase how much you drink until you have taken the amount recommended by your health care provider.  Drink enough clear fluid to keep your urine clear or pale yellow. If you were told to drink an ORS, finish the ORS first, then start slowly drinking other clear fluids. Drink fluids such as: ? Water. Do not drink only water. Doing that can lead to having too little salt (sodium) in the body (hyponatremia). ? Ice chips. ? Fruit juice that you have added water to (diluted fruit juice). ? Low-calorie sports drinks.  Avoid: ? Alcohol. ? Drinks that contain a lot of sugar. These include high-calorie sports drinks, fruit juice that is not diluted, and soda. ? Caffeine. ? Foods that are greasy or contain a lot of fat or sugar.  Take over-the-counter and prescription medicines only as told by your health care provider.  Do not take sodium tablets. This can lead to having too much sodium in the body (hypernatremia).  Eat foods that contain a healthy balance of electrolytes, such as bananas, oranges, potatoes, tomatoes, and spinach.  Keep all follow-up visits as told by your health care provider. This is important. Contact a health care provider if:  You have abdominal pain that: ? Gets worse. ? Stays in one area (localizes).  You have a rash.  You have a stiff neck.  You are more irritable than usual.  You are sleepier or more difficult to wake up than usual.  You feel weak or dizzy.  You feel very thirsty.  You have urinated only a small amount of very dark urine over 6-8 hours. Get help right away if:  You have symptoms of severe dehydration.  You cannot drink fluids without vomiting.  Your symptoms get worse with treatment.  You have a fever.  You have a severe headache.  You have vomiting or diarrhea that: ? Gets worse. ? Does not go away.  You have blood or green matter  (bile) in your vomit.  You have blood in your stool. This may cause stool to look black and tarry.  You have not urinated in 6-8 hours.  You faint.  Your heart rate while sitting still is over 100 beats a minute.  You have trouble breathing. This information is not intended to replace advice given to you by your health care provider. Make sure you discuss any questions you have with your health care provider. Document Released: 02/22/2005 Document Revised: 09/19/2015 Document Reviewed: 04/18/2015 Elsevier Interactive Patient Education  2018 [employer]vier Inc.  

## 2017-09-13 LAB — CBC
HEMATOCRIT: 36.6 % — AB (ref 37.5–51.0)
Hemoglobin: 12 g/dL — ABNORMAL LOW (ref 13.0–17.7)
MCH: 31.9 pg (ref 26.6–33.0)
MCHC: 32.8 g/dL (ref 31.5–35.7)
MCV: 97 fL (ref 79–97)
Platelets: 207 10*3/uL (ref 150–450)
RBC: 3.76 x10E6/uL — ABNORMAL LOW (ref 4.14–5.80)
RDW: 13.7 % (ref 12.3–15.4)
WBC: 6.4 10*3/uL (ref 3.4–10.8)

## 2017-09-22 ENCOUNTER — Other Ambulatory Visit (INDEPENDENT_AMBULATORY_CARE_PROVIDER_SITE_OTHER): Payer: Medicare HMO

## 2017-09-22 DIAGNOSIS — D649 Anemia, unspecified: Secondary | ICD-10-CM | POA: Diagnosis not present

## 2017-09-22 LAB — HEMOCCULT GUIAC POC 1CARD (OFFICE)
Card #2 Fecal Occult Blod, POC: NEGATIVE
FECAL OCCULT BLD: NEGATIVE
Fecal Occult Blood, POC: POSITIVE — AB

## 2017-09-28 ENCOUNTER — Other Ambulatory Visit: Payer: Self-pay | Admitting: Family Medicine

## 2017-09-28 DIAGNOSIS — D649 Anemia, unspecified: Secondary | ICD-10-CM | POA: Diagnosis not present

## 2017-09-28 DIAGNOSIS — E538 Deficiency of other specified B group vitamins: Secondary | ICD-10-CM | POA: Diagnosis not present

## 2017-09-28 DIAGNOSIS — R05 Cough: Secondary | ICD-10-CM

## 2017-09-28 DIAGNOSIS — R195 Other fecal abnormalities: Secondary | ICD-10-CM | POA: Diagnosis not present

## 2017-09-28 DIAGNOSIS — R059 Cough, unspecified: Secondary | ICD-10-CM

## 2017-10-01 ENCOUNTER — Other Ambulatory Visit: Payer: Self-pay | Admitting: Family Medicine

## 2017-10-01 DIAGNOSIS — E785 Hyperlipidemia, unspecified: Principal | ICD-10-CM

## 2017-10-01 DIAGNOSIS — E1169 Type 2 diabetes mellitus with other specified complication: Secondary | ICD-10-CM

## 2017-10-04 DIAGNOSIS — I420 Dilated cardiomyopathy: Secondary | ICD-10-CM | POA: Diagnosis not present

## 2017-10-04 DIAGNOSIS — I4892 Unspecified atrial flutter: Secondary | ICD-10-CM | POA: Diagnosis not present

## 2017-10-05 DIAGNOSIS — E538 Deficiency of other specified B group vitamins: Secondary | ICD-10-CM | POA: Diagnosis not present

## 2017-10-12 DIAGNOSIS — D519 Vitamin B12 deficiency anemia, unspecified: Secondary | ICD-10-CM | POA: Diagnosis not present

## 2017-10-19 DIAGNOSIS — D519 Vitamin B12 deficiency anemia, unspecified: Secondary | ICD-10-CM | POA: Diagnosis not present

## 2017-10-26 DIAGNOSIS — D519 Vitamin B12 deficiency anemia, unspecified: Secondary | ICD-10-CM | POA: Diagnosis not present

## 2017-10-28 DIAGNOSIS — I781 Nevus, non-neoplastic: Secondary | ICD-10-CM | POA: Diagnosis not present

## 2017-10-28 DIAGNOSIS — X32XXXA Exposure to sunlight, initial encounter: Secondary | ICD-10-CM | POA: Diagnosis not present

## 2017-10-28 DIAGNOSIS — D2372 Other benign neoplasm of skin of left lower limb, including hip: Secondary | ICD-10-CM | POA: Diagnosis not present

## 2017-10-28 DIAGNOSIS — L814 Other melanin hyperpigmentation: Secondary | ICD-10-CM | POA: Diagnosis not present

## 2017-10-28 DIAGNOSIS — Z85828 Personal history of other malignant neoplasm of skin: Secondary | ICD-10-CM | POA: Diagnosis not present

## 2017-10-28 DIAGNOSIS — L570 Actinic keratosis: Secondary | ICD-10-CM | POA: Diagnosis not present

## 2017-10-28 DIAGNOSIS — L821 Other seborrheic keratosis: Secondary | ICD-10-CM | POA: Diagnosis not present

## 2017-10-28 DIAGNOSIS — Z08 Encounter for follow-up examination after completed treatment for malignant neoplasm: Secondary | ICD-10-CM | POA: Diagnosis not present

## 2017-10-28 DIAGNOSIS — L218 Other seborrheic dermatitis: Secondary | ICD-10-CM | POA: Diagnosis not present

## 2017-10-29 ENCOUNTER — Other Ambulatory Visit: Payer: Self-pay | Admitting: Family Medicine

## 2017-10-29 DIAGNOSIS — E1169 Type 2 diabetes mellitus with other specified complication: Secondary | ICD-10-CM

## 2017-10-29 DIAGNOSIS — E119 Type 2 diabetes mellitus without complications: Secondary | ICD-10-CM

## 2017-10-29 DIAGNOSIS — E785 Hyperlipidemia, unspecified: Secondary | ICD-10-CM

## 2017-11-01 ENCOUNTER — Encounter: Payer: Self-pay | Admitting: *Deleted

## 2017-11-02 ENCOUNTER — Encounter: Payer: Self-pay | Admitting: *Deleted

## 2017-11-02 ENCOUNTER — Ambulatory Visit: Payer: Medicare HMO | Admitting: Certified Registered"

## 2017-11-02 ENCOUNTER — Encounter
Admission: RE | Disposition: A | Discharge status: Home / Self Care | Payer: Self-pay | Source: Ambulatory Visit | Attending: Internal Medicine

## 2017-11-02 ENCOUNTER — Ambulatory Visit
Admission: RE | Admit: 2017-11-02 | Discharge: 2017-11-02 | Disposition: A | Discharge status: Home / Self Care | Payer: Medicare HMO | Source: Ambulatory Visit | Attending: Internal Medicine | Admitting: Internal Medicine

## 2017-11-02 DIAGNOSIS — E785 Hyperlipidemia, unspecified: Secondary | ICD-10-CM | POA: Diagnosis not present

## 2017-11-02 DIAGNOSIS — D123 Benign neoplasm of transverse colon: Secondary | ICD-10-CM | POA: Insufficient documentation

## 2017-11-02 DIAGNOSIS — K5730 Diverticulosis of large intestine without perforation or abscess without bleeding: Secondary | ICD-10-CM | POA: Insufficient documentation

## 2017-11-02 DIAGNOSIS — E1122 Type 2 diabetes mellitus with diabetic chronic kidney disease: Secondary | ICD-10-CM | POA: Insufficient documentation

## 2017-11-02 DIAGNOSIS — I4891 Unspecified atrial fibrillation: Secondary | ICD-10-CM | POA: Insufficient documentation

## 2017-11-02 DIAGNOSIS — K640 First degree hemorrhoids: Secondary | ICD-10-CM | POA: Insufficient documentation

## 2017-11-02 DIAGNOSIS — K228 Other specified diseases of esophagus: Secondary | ICD-10-CM | POA: Diagnosis not present

## 2017-11-02 DIAGNOSIS — N400 Enlarged prostate without lower urinary tract symptoms: Secondary | ICD-10-CM | POA: Diagnosis not present

## 2017-11-02 DIAGNOSIS — D126 Benign neoplasm of colon, unspecified: Secondary | ICD-10-CM | POA: Diagnosis not present

## 2017-11-02 DIAGNOSIS — Z79899 Other long term (current) drug therapy: Secondary | ICD-10-CM | POA: Diagnosis not present

## 2017-11-02 DIAGNOSIS — K635 Polyp of colon: Secondary | ICD-10-CM | POA: Diagnosis not present

## 2017-11-02 DIAGNOSIS — Z7984 Long term (current) use of oral hypoglycemic drugs: Secondary | ICD-10-CM | POA: Diagnosis not present

## 2017-11-02 DIAGNOSIS — R195 Other fecal abnormalities: Secondary | ICD-10-CM | POA: Diagnosis not present

## 2017-11-02 DIAGNOSIS — K449 Diaphragmatic hernia without obstruction or gangrene: Secondary | ICD-10-CM | POA: Diagnosis not present

## 2017-11-02 DIAGNOSIS — N189 Chronic kidney disease, unspecified: Secondary | ICD-10-CM | POA: Insufficient documentation

## 2017-11-02 DIAGNOSIS — K5790 Diverticulosis of intestine, part unspecified, without perforation or abscess without bleeding: Secondary | ICD-10-CM | POA: Diagnosis not present

## 2017-11-02 HISTORY — PX: ESOPHAGOGASTRODUODENOSCOPY (EGD) WITH PROPOFOL: SHX5813

## 2017-11-02 HISTORY — DX: Unspecified atrial fibrillation: I48.91

## 2017-11-02 HISTORY — PX: COLONOSCOPY WITH PROPOFOL: SHX5780

## 2017-11-02 LAB — GLUCOSE, CAPILLARY: Glucose-Capillary: 125 mg/dL — ABNORMAL HIGH (ref 70–99)

## 2017-11-02 SURGERY — COLONOSCOPY WITH PROPOFOL
Anesthesia: General

## 2017-11-02 MED ORDER — LIDOCAINE HCL (PF) 1 % IJ SOLN
INTRAMUSCULAR | Status: AC
  Administered 2017-11-02: 0.3 mL via INTRADERMAL
  Filled 2017-11-02: qty 2

## 2017-11-02 MED ORDER — SODIUM CHLORIDE 0.9 % IV SOLN
INTRAVENOUS | Status: DC
  Administered 2017-11-02: 1000 mL via INTRAVENOUS

## 2017-11-02 MED ORDER — EPHEDRINE SULFATE 50 MG/ML IJ SOLN
INTRAMUSCULAR | Status: DC | PRN
  Administered 2017-11-02 (×2): 10 mg via INTRAVENOUS

## 2017-11-02 MED ORDER — LIDOCAINE HCL (PF) 2 % IJ SOLN
INTRAMUSCULAR | Status: AC
  Filled 2017-11-02: qty 10

## 2017-11-02 MED ORDER — LIDOCAINE HCL (PF) 1 % IJ SOLN
2.0000 mL | Freq: Once | INTRAMUSCULAR | Status: AC
  Administered 2017-11-02: 0.3 mL via INTRADERMAL

## 2017-11-02 MED ORDER — PROPOFOL 10 MG/ML IV BOLUS
INTRAVENOUS | Status: DC | PRN
  Administered 2017-11-02: 100 mg via INTRAVENOUS

## 2017-11-02 MED ORDER — EPHEDRINE SULFATE 50 MG/ML IJ SOLN
INTRAMUSCULAR | Status: AC
  Filled 2017-11-02: qty 1

## 2017-11-02 MED ORDER — PROPOFOL 500 MG/50ML IV EMUL
INTRAVENOUS | Status: AC
  Filled 2017-11-02: qty 50

## 2017-11-02 MED ORDER — FENTANYL CITRATE (PF) 100 MCG/2ML IJ SOLN: INTRAMUSCULAR | Status: DC | PRN

## 2017-11-02 MED ORDER — LIDOCAINE HCL (CARDIAC) PF 100 MG/5ML IV SOSY
PREFILLED_SYRINGE | INTRAVENOUS | Status: DC | PRN
  Administered 2017-11-02: 40 mg via INTRAVENOUS

## 2017-11-02 MED ORDER — PROPOFOL 500 MG/50ML IV EMUL
INTRAVENOUS | Status: DC | PRN
  Administered 2017-11-02: 100 ug/kg/min via INTRAVENOUS

## 2017-11-02 NOTE — Op Note (Signed)
[facility] Gastroenterology Patient Name: Cody Howard Procedure Date: 11/02/2017 9:34 AM MRN: [id number] Account #: [id number] Date of Birth: 1934/05/17 Admit Type: Outpatient Age: 83 Room: [facility] ENDO ROOM 2 Gender: Male Note Status: Finalized Procedure:            Upper GI endoscopy Indications:          Dysphagia, Heme positive stool Providers:            Benay Pike. Alice Reichert MD, MD Referring MD:         [name], MD (Referring MD) Medicines:            Propofol per Anesthesia Complications:        No immediate complications. Procedure:            Pre-Anesthesia Assessment:                       - The risks and benefits of the procedure and the                        sedation options and risks were discussed with the                        patient. All questions were answered and informed                        consent was obtained.                       - Patient identification and proposed procedure were                        verified prior to the procedure by the nurse. The                        procedure was verified in the procedure room.                       - ASA Grade Assessment: III - A patient with severe                        systemic disease.                       - After reviewing the risks and benefits, the patient                        was deemed in satisfactory condition to undergo the                        procedure.                       After obtaining informed consent, the endoscope was                        passed under direct vision. Throughout the procedure,                        the patient's blood pressure, pulse, and oxygen  saturations were monitored continuously. The Endoscope                        was introduced through the mouth, and advanced to the                        third part of duodenum. The upper GI endoscopy was                        accomplished without difficulty. The patient  tolerated                        the procedure well. Findings:      Moderate tortuosity of the mid to distal esophagus was noted compatible       with a diagnosis of Presbyesophagus.      There is no endoscopic evidence of stenosis, stricture or mass in the       entire esophagus.      A 2 cm hiatal hernia was present.      The examined duodenum was normal.      The exam was otherwise without abnormality. Impression:           - 2 cm hiatal hernia.                       - Normal examined duodenum.                       - The examination was otherwise normal.                       - No specimens collected. Recommendation:       - Reassurance. Patient's dysphagia unlikely of                        esophageal origin other than presbyesophagus. Primary                        pharyngeal globus symptoms suggest GERD or anxiety as a                        possible cause.                       - Proceed with colonoscopy Procedure Code(s):    --- Professional ---                       [phone], Esophagogastroduodenoscopy, flexible, transoral;                        diagnostic, including collection of specimen(s) by                        brushing or washing, when performed (separate procedure) Diagnosis Code(s):    --- Professional ---                       R19.5, Other fecal abnormalities                       R13.10, Dysphagia, unspecified  K44.9, Diaphragmatic hernia without obstruction or                        gangrene CPT copyright 2017 American Medical Association. All rights reserved. The codes documented in this report are preliminary and upon coder review may  be revised to meet current compliance requirements. Efrain Sella MD, MD 11/02/2017 10:02:56 AM This report has been signed electronically. Number of Addenda: 0 Note Initiated On: 11/02/2017 9:34 AM      [facility]

## 2017-11-02 NOTE — Anesthesia Postprocedure Evaluation (Signed)
Anesthesia Post Note  Patient: MYNOR WITKOP  Procedure(s) Performed: COLONOSCOPY WITH PROPOFOL (N/A ) ESOPHAGOGASTRODUODENOSCOPY (EGD) WITH PROPOFOL (N/A )  Patient location during evaluation: Endoscopy Anesthesia Type: General Level of consciousness: awake and alert, patient cooperative and oriented Pain management: satisfactory to patient Vital Signs Assessment: post-procedure vital signs reviewed and stable Respiratory status: spontaneous breathing and respiratory function stable Cardiovascular status: blood pressure returned to baseline and stable Postop Assessment: no headache, no backache, patient able to bend at knees, no apparent nausea or vomiting, adequate PO intake and able to ambulate Anesthetic complications: no     Last Vitals:  Vitals:   11/02/17 0905 11/02/17 1019  BP:  (!) 128/48  Pulse: 66   Resp: 17   Temp: (!) 35.8 C (!) 36.1 C  SpO2: 99%     Last Pain:  Vitals:   11/02/17 1019  TempSrc: Tympanic  PainSc: Asleep                 Mikiala Fugett H Dannon Nguyenthi

## 2017-11-02 NOTE — H&P (Signed)
Outpatient short stay form Pre-procedure 11/02/2017 9:34 AM Cody Howard, M.D.  Primary Physician: Otilio Miu, MD  Reason for visit: Dysphagia, anemia, Heme Positive stool  History of present illness:  Patient with intermittent dysphagia, anemia. Found to be heme positive on examination. NO frank hematochezia or melena.    Current Facility-Administered Medications:  .  lidocaine (PF) (XYLOCAINE) 1 % injection, , , ,   Medications Prior to Admission  Medication Sig Dispense Refill Last Dose  . albuterol (PROVENTIL HFA;VENTOLIN HFA) 108 (90 Base) MCG/ACT inhaler INHALE 2 PUFFS INTO THE LUNGS EVERY 6 (SIX) HOURS AS NEEDED FOR WHEEZING OR SHORTNESS OF BREATH. 18 Inhaler 0 Past Month at Unknown time  . ACCU-CHEK SOFTCLIX LANCETS lancets TEST BLOOD SUGAR EVERY DAY 100 each 0 Taking  . benzonatate (TESSALON) 100 MG capsule Take 1 capsule (100 mg total) by mouth 2 (two) times daily as needed for cough. 20 capsule 0   . Blood Glucose Monitoring Suppl (ACCU-CHEK AVIVA PLUS) w/Device KIT USE AS DIRECTED 1 kit 0 Taking  . glucose blood (ACCU-CHEK AVIVA PLUS) test strip 1 each by Other route daily. Use as instructed 100 each 1 Taking  . metFORMIN (GLUCOPHAGE) 1000 MG tablet TAKE 1 TABLET BY MOUTH TWICE A DAY 180 tablet 0   . montelukast (SINGULAIR) 10 MG tablet Take 1 tablet (10 mg total) by mouth at bedtime. 30 tablet 3 Taking  . Omega-3 Fatty Acids (FISH OIL) 1000 MG CAPS Take 1 capsule (1,000 mg total) by mouth daily. 90 capsule 3 Taking  . pantoprazole (PROTONIX) 40 MG tablet TAKE 1 TABLET BY MOUTH EVERY DAY 30 tablet 3 Taking  . simvastatin (ZOCOR) 80 MG tablet TAKE 1 TABLET (80 MG TOTAL) BY MOUTH EVERY EVENING. 30 tablet 1 Taking  . simvastatin (ZOCOR) 80 MG tablet TAKE 1 TABLET (80 MG TOTAL) BY MOUTH EVERY EVENING. 90 tablet 0   . tamsulosin (FLOMAX) 0.4 MG CAPS capsule Take 1 capsule by mouth daily.   Taking     No Known Allergies   Past Medical History:  Diagnosis Date  . AF  (atrial fibrillation) ([place])   . Arthritis    hands  . Chronic kidney disease   . Diabetes mellitus without complication ([place])   . Hyperlipidemia   . Prostate enlargement     Review of systems:  Otherwise negative.    Physical Exam  Gen: Alert, oriented. Appears stated age.  HEENT: [place]/AT. PERRLA. Lungs: CTA, no wheezes. CV: RR nl S1, S2. Abd: soft, benign, no masses. BS+ Ext: No edema. Pulses 2+    Planned procedures: Proceed with EGD and colonoscopy. The patient understands the nature of the planned procedure, indications, risks, alternatives and potential complications including but not limited to bleeding, infection, perforation, damage to internal organs and possible oversedation/side effects from anesthesia. The patient agrees and gives consent to proceed.  Please refer to procedure notes for findings, recommendations and patient disposition/instructions.     Scorpio Fortin K. Alice Howard, M.D. Gastroenterology 11/02/2017  9:34 AM

## 2017-11-02 NOTE — Op Note (Signed)
[facility] Gastroenterology Patient Name: Cody Howard Procedure Date: 11/02/2017 9:34 AM MRN: [id number] Account #: [id number] Date of Birth: 09-05-1934 Admit Type: Outpatient Age: 83 Room: [facility] ENDO ROOM 2 Gender: Male Note Status: Finalized Procedure:            Colonoscopy Indications:          Heme positive stool Providers:            Benay Pike. Darian Ace MD, MD Medicines:            Propofol per Anesthesia Complications:        No immediate complications. Procedure:            Pre-Anesthesia Assessment:                       - The risks and benefits of the procedure and the                        sedation options and risks were discussed with the                        patient. All questions were answered and informed                        consent was obtained.                       - Patient identification and proposed procedure were                        verified prior to the procedure by the nurse. The                        procedure was verified in the procedure room.                       - ASA Grade Assessment: III - A patient with severe                        systemic disease.                       - After reviewing the risks and benefits, the patient                        was deemed in satisfactory condition to undergo the                        procedure.                       After obtaining informed consent, the colonoscope was                        passed under direct vision. Throughout the procedure,                        the patient's blood pressure, pulse, and oxygen                        saturations were monitored continuously. The  Colonoscope was introduced through the anus and                        advanced to the the cecum, identified by appendiceal                        orifice and ileocecal valve. The colonoscopy was                        performed without difficulty. The patient tolerated the                 procedure well. The quality of the bowel preparation                        was good. The ileocecal valve, appendiceal orifice, and                        rectum were photographed. Findings:      The perianal and digital rectal examinations were normal. Pertinent       negatives include normal sphincter tone and no palpable rectal lesions.      Many small-mouthed diverticula were found in the entire colon.      A 5 mm polyp was found in the hepatic flexure. The polyp was       semi-pedunculated. The polyp was removed with a jumbo cold forceps.       Resection and retrieval were complete.      Non-bleeding internal hemorrhoids were found during retroflexion. The       hemorrhoids were Grade I (internal hemorrhoids that do not prolapse).      The exam was otherwise without abnormality. Impression:           - Diverticulosis in the entire examined colon.                       - One 5 mm polyp at the hepatic flexure, removed with a                        jumbo cold forceps. Resected and retrieved.                       - Non-bleeding internal hemorrhoids.                       - The examination was otherwise normal. Recommendation:       - Patient has a contact number available for                        emergencies. The signs and symptoms of potential                        delayed complications were discussed with the patient.                        Return to normal activities tomorrow. Written discharge                        instructions were provided to the patient.                       -  Resume previous diet.                       - Continue present medications.                       - Await pathology results.                       - No repeat colonoscopy due to age.                       - Return to GI office PRN.                       - The findings and recommendations were discussed with                        the patient and their family. Procedure Code(s):    ---  Professional ---                       [phone], Colonoscopy, flexible; with biopsy, single or                        multiple Diagnosis Code(s):    --- Professional ---                       K57.30, Diverticulosis of large intestine without                        perforation or abscess without bleeding                       R19.5, Other fecal abnormalities                       D12.3, Benign neoplasm of transverse colon (hepatic                        flexure or splenic flexure)                       K64.0, First degree hemorrhoids CPT copyright 2017 American Medical Association. All rights reserved. The codes documented in this report are preliminary and upon coder review may  be revised to meet current compliance requirements. Efrain Sella MD, MD 11/02/2017 10:18:06 AM This report has been signed electronically. Number of Addenda: 0 Note Initiated On: 11/02/2017 9:34 AM Scope Withdrawal Time: 0 hours 6 minutes 3 seconds  Total Procedure Duration: 0 hours 10 minutes 48 seconds       [facility]

## 2017-11-02 NOTE — Anesthesia Preprocedure Evaluation (Signed)
Anesthesia Evaluation  Patient identified by MRN, date of birth, ID band Patient awake    Reviewed: Allergy & Precautions, H&P , NPO status , Patient's Chart, lab work & pertinent test results, reviewed documented beta blocker date and time   Airway Mallampati: II   Neck ROM: full    Dental  (+) Poor Dentition, Teeth Intact   Pulmonary neg pulmonary ROS, former smoker,    Pulmonary exam normal        Cardiovascular Exercise Tolerance: Good negative cardio ROS Normal cardiovascular exam Rhythm:regular Rate:Normal     Neuro/Psych negative neurological ROS  negative psych ROS   GI/Hepatic negative GI ROS, Neg liver ROS,   Endo/Other  negative endocrine ROSdiabetes, Well Controlled, Type 2, Oral Hypoglycemic Agents  Renal/GU Renal diseasenegative Renal ROS  negative genitourinary   Musculoskeletal   Abdominal   Peds  Hematology negative hematology ROS (+)   Anesthesia Other Findings Past Medical History: No date: AF (atrial fibrillation) (H[facility]) No date: Arthritis     Comment:  hands No date: Chronic kidney disease No date: Diabetes mellitus without complication (H[facility]) No date: Hyperlipidemia No date: Prostate enlargement Past Surgical History: 02/26/2015: COLONOSCOPY; N/A     Comment:  Procedure: COLONOSCOPY;  Surgeon: Hulen Luster, MD;                Location: [place];  Service:               Gastroenterology;  Laterality: N/A; 02/26/2015: ESOPHAGOGASTRODUODENOSCOPY; N/A     Comment:  Procedure: ESOPHAGOGASTRODUODENOSCOPY (EGD);  Surgeon:               Hulen Luster, MD;  Location: [place];  Service:               Gastroenterology;  Laterality: N/A;  Diabetic - oral meds No date: KIDNEY SURGERY; Right     Comment:  surgery when 83 years old 02/26/2015: POLYPECTOMY     Comment:  Procedure: POLYPECTOMY;  Surgeon: Hulen Luster, MD;                Location: [place];  Service:    Gastroenterology;; No date: sebaceous cyst removal; Right     Comment:  located right of spine on upper back   Reproductive/Obstetrics negative OB ROS                             Anesthesia Physical Anesthesia Plan  ASA: III  Anesthesia Plan: General   Post-op Pain Management:    Induction:   PONV Risk Score and Plan:   Airway Management Planned:   Additional Equipment:   Intra-op Plan:   Post-operative Plan:   Informed Consent: I have reviewed the patients History and Physical, chart, labs and discussed the procedure including the risks, benefits and alternatives for the proposed anesthesia with the patient or authorized representative who has indicated his/her understanding and acceptance.   Dental Advisory Given  Plan Discussed with: CRNA  Anesthesia Plan Comments:         Anesthesia Quick Evaluation

## 2017-11-02 NOTE — Anesthesia Post-op Follow-up Note (Signed)
Anesthesia QCDR form completed.        

## 2017-11-02 NOTE — Transfer of Care (Signed)
Immediate Anesthesia Transfer of Care Note  Patient: Cody Howard  Procedure(s) Performed: COLONOSCOPY WITH PROPOFOL (N/A ) ESOPHAGOGASTRODUODENOSCOPY (EGD) WITH PROPOFOL (N/A )  Patient Location: Endoscopy Unit  Anesthesia Type:General  Level of Consciousness: drowsy and patient cooperative  Airway & Oxygen Therapy: Patient Spontanous Breathing  Post-op Assessment: Report given to RN, Post -op Vital signs reviewed and stable and Patient moving all extremities  Post vital signs: Reviewed and stable  Last Vitals:  Vitals Value Taken Time  BP 128/48 11/02/2017 10:18 AM  Temp    Pulse 63 11/02/2017 10:19 AM  Resp 16 11/02/2017 10:19 AM  SpO2 100 % 11/02/2017 10:19 AM  Vitals shown include unvalidated device data.  Last Pain:  Vitals:   11/02/17 0905  TempSrc: (P) Tympanic  PainSc: (P) 0-No pain         Complications: No apparent anesthesia complications

## 2017-11-02 NOTE — Interval H&P Note (Signed)
History and Physical Interval Note:  11/02/2017 9:38 AM  Cody Howard  has presented today for surgery, with the diagnosis of HEME POSITIVE STOOL  The various methods of treatment have been discussed with the patient and family. After consideration of risks, benefits and other options for treatment, the patient has consented to  Procedure(s): COLONOSCOPY WITH PROPOFOL (N/A) ESOPHAGOGASTRODUODENOSCOPY (EGD) WITH PROPOFOL (N/A) as a surgical intervention .  The patient's history has been reviewed, patient examined, no change in status, stable for surgery.  I have reviewed the patient's chart and labs.  Questions were answered to the patient's satisfaction.     [place], [place]

## 2017-11-03 ENCOUNTER — Encounter: Payer: Self-pay | Admitting: Internal Medicine

## 2017-11-03 NOTE — Anesthesia Postprocedure Evaluation (Signed)
Anesthesia Post Note  Patient: FELICE DEEM  Procedure(s) Performed: COLONOSCOPY WITH PROPOFOL (N/A ) ESOPHAGOGASTRODUODENOSCOPY (EGD) WITH PROPOFOL (N/A )  Patient location during evaluation: PACU Anesthesia Type: General Level of consciousness: awake and alert Pain management: pain level controlled Vital Signs Assessment: post-procedure vital signs reviewed and stable Respiratory status: spontaneous breathing, nonlabored ventilation, respiratory function stable and patient connected to nasal cannula oxygen Cardiovascular status: blood pressure returned to baseline and stable Postop Assessment: no apparent nausea or vomiting Anesthetic complications: no     Last Vitals:  Vitals:   11/02/17 1019 11/02/17 1029  BP: (!) 128/48 (!) 144/43  Pulse:    Resp:    Temp: (!) 36.1 C   SpO2:      Last Pain:  Vitals:   11/03/17 0721  TempSrc:   PainSc: 0-No pain                 Molli Barrows

## 2017-11-04 LAB — SURGICAL PATHOLOGY

## 2017-11-21 ENCOUNTER — Telehealth: Payer: Self-pay

## 2017-11-21 NOTE — Telephone Encounter (Signed)
LM to schedule appt for FU- Need to remind [facility] [employer] papers needing. [facility]

## 2017-11-29 DIAGNOSIS — I5022 Chronic systolic (congestive) heart failure: Secondary | ICD-10-CM | POA: Insufficient documentation

## 2017-12-01 ENCOUNTER — Other Ambulatory Visit: Payer: Self-pay | Admitting: Family Medicine

## 2017-12-01 DIAGNOSIS — J029 Acute pharyngitis, unspecified: Secondary | ICD-10-CM

## 2017-12-17 DIAGNOSIS — R69 Illness, unspecified: Secondary | ICD-10-CM | POA: Diagnosis not present

## 2018-01-06 ENCOUNTER — Telehealth: Payer: Self-pay

## 2018-01-06 ENCOUNTER — Other Ambulatory Visit: Payer: Self-pay

## 2018-01-06 NOTE — Telephone Encounter (Signed)
Faxed to [employer]!

## 2018-01-11 ENCOUNTER — Ambulatory Visit (INDEPENDENT_AMBULATORY_CARE_PROVIDER_SITE_OTHER): Payer: Medicare HMO | Admitting: Family Medicine

## 2018-01-11 ENCOUNTER — Encounter: Payer: Self-pay | Admitting: Family Medicine

## 2018-01-11 VITALS — BP 138/60 | HR 60 | Ht 71.0 in | Wt 192.0 lb

## 2018-01-11 DIAGNOSIS — G459 Transient cerebral ischemic attack, unspecified: Secondary | ICD-10-CM | POA: Diagnosis not present

## 2018-01-11 DIAGNOSIS — I429 Cardiomyopathy, unspecified: Secondary | ICD-10-CM

## 2018-01-11 DIAGNOSIS — R404 Transient alteration of awareness: Secondary | ICD-10-CM | POA: Diagnosis not present

## 2018-01-11 DIAGNOSIS — I4892 Unspecified atrial flutter: Secondary | ICD-10-CM | POA: Diagnosis not present

## 2018-01-11 MED ORDER — LISINOPRIL 10 MG PO TABS: 10.0000 mg | ORAL_TABLET | Freq: Every day | ORAL | 4 refills | Status: DC

## 2018-01-11 MED ORDER — CARVEDILOL 6.25 MG PO TABS: 6.2500 mg | ORAL_TABLET | Freq: Two times a day (BID) | ORAL | 4 refills | Status: DC

## 2018-01-11 NOTE — Progress Notes (Signed)
Date:  01/11/2018   Name:  Cody Howard   DOB:  10-15-1934   MRN:  [id number]   Chief Complaint: Transient Ischemic Attack (having "spells"- last one was Oct 31. "can feel it coming on, can't say what I want to say"- spells last approx 30 seconds. Will get hot and doesn't feel steady/ has to hold onto something. ) and prev 13 (needs this) Neurologic Problem  The patient's primary symptoms include clumsiness and slurred speech. The patient's pertinent negatives include no altered mental status, focal sensory loss, focal weakness, loss of balance, memory loss, near-syncope, syncope, visual change or weakness. Primary symptoms comment: expressive aphasia. This is a recurrent problem. The current episode started in the past 7 days. The neurological problem developed suddenly. Progression since onset: recently stopped asa. There was no focality noted. Associated symptoms include an aura and diaphoresis. Pertinent negatives include no abdominal pain, auditory change, back pain, bladder incontinence, bowel incontinence, chest pain, confusion, dizziness, fever, headaches, nausea, neck pain, palpitations or shortness of breath. (Begins with "heat" and diaphoresis/out of it /weak for 30 seconds) Past treatments include nothing.     Review of Systems  Constitutional: Positive for diaphoresis. Negative for chills and fever.  HENT: Negative for drooling, ear discharge, ear pain and sore throat.   Respiratory: Negative for cough, shortness of breath and wheezing.   Cardiovascular: Negative for chest pain, palpitations, leg swelling and near-syncope.  Gastrointestinal: Negative for abdominal pain, blood in stool, bowel incontinence, constipation, diarrhea and nausea.  Endocrine: Negative for polydipsia.  Genitourinary: Negative for bladder incontinence, dysuria, frequency, hematuria and urgency.  Musculoskeletal: Negative for back pain, myalgias and neck pain.  Skin: Negative for rash.    Allergic/Immunologic: Negative for environmental allergies.  Neurological: Negative for dizziness, focal weakness, syncope, weakness, headaches and loss of balance.  Hematological: Does not bruise/bleed easily.  Psychiatric/Behavioral: Negative for confusion, memory loss and suicidal ideas. The patient is not nervous/anxious.     Patient Active Problem List   Diagnosis Date Noted  . CKD (chronic kidney disease) stage 3, GFR 30-59 ml/min (HCC) 08/18/2017  . Bradycardia 08/18/2017  . Atrial flutter, paroxysmal ([place]) 07/21/2017  . Functional dyspnea 07/06/2017  . Cardiomyopathy ([place]) 07/06/2017  . Hyperlipidemia, mixed 06/20/2017  . Hyperlipidemia associated with type 2 diabetes mellitus ([place]) 04/06/2016  . Type 2 diabetes mellitus without complications ([place]) 48/27/0786  . Personal history of other diseases of male genital organs 02/10/2015  . Sebaceous cyst 08/28/2014    No Known Allergies  Past Surgical History:  Procedure Laterality Date  . COLONOSCOPY N/A 02/26/2015   Procedure: COLONOSCOPY;  Surgeon: Hulen Luster, MD;  Location: [place];  Service: Gastroenterology;  Laterality: N/A;  . COLONOSCOPY WITH PROPOFOL N/A 11/02/2017   Procedure: COLONOSCOPY WITH PROPOFOL;  Surgeon: Toledo, Benay Pike, MD;  Location: [facility];  Service: Gastroenterology;  Laterality: N/A;  . ESOPHAGOGASTRODUODENOSCOPY N/A 02/26/2015   Procedure: ESOPHAGOGASTRODUODENOSCOPY (EGD);  Surgeon: Hulen Luster, MD;  Location: [place];  Service: Gastroenterology;  Laterality: N/A;  Diabetic - oral meds  . ESOPHAGOGASTRODUODENOSCOPY (EGD) WITH PROPOFOL N/A 11/02/2017   Procedure: ESOPHAGOGASTRODUODENOSCOPY (EGD) WITH PROPOFOL;  Surgeon: Toledo, Benay Pike, MD;  Location: [facility];  Service: Gastroenterology;  Laterality: N/A;  . KIDNEY SURGERY Right    surgery when 83 years old  . POLYPECTOMY  02/26/2015   Procedure: POLYPECTOMY;  Surgeon: Hulen Luster, MD;  Location: [place];   Service: Gastroenterology;;  . sebaceous cyst removal Right  located right of spine on upper back    Social History   Tobacco Use  . Smoking status: Former Smoker    Last attempt to quit: 03/08/1978    Years since quitting: 39.8  . Smokeless tobacco: Never Used  Substance Use Topics  . Alcohol use: Yes    Alcohol/week: 0.0 standard drinks    Comment: Rarely  . Drug use: No     Medication list has been reviewed and updated.  Current Meds  Medication Sig  . ACCU-CHEK SOFTCLIX LANCETS lancets TEST BLOOD SUGAR EVERY DAY  . albuterol (PROVENTIL HFA;VENTOLIN HFA) 108 (90 Base) MCG/ACT inhaler INHALE 2 PUFFS INTO THE LUNGS EVERY 6 (SIX) HOURS AS NEEDED FOR WHEEZING OR SHORTNESS OF BREATH.  Marland Kitchen Blood Glucose Monitoring Suppl (ACCU-CHEK AVIVA PLUS) w/Device KIT USE AS DIRECTED  . carvedilol (COREG) 6.25 MG tablet Take 6.25 mg by mouth 2 (two) times daily with a meal.  . glucose blood (ACCU-CHEK AVIVA PLUS) test strip 1 each by Other route daily. Use as instructed  . lisinopril (PRINIVIL,ZESTRIL) 10 MG tablet Take 1 tablet by mouth daily.  . metFORMIN (GLUCOPHAGE) 1000 MG tablet TAKE 1 TABLET BY MOUTH TWICE A DAY  . montelukast (SINGULAIR) 10 MG tablet TAKE 1 TABLET BY MOUTH EVERYDAY AT BEDTIME  . Omega-3 Fatty Acids (FISH OIL) 1000 MG CAPS Take 1 capsule (1,000 mg total) by mouth daily.  . simvastatin (ZOCOR) 80 MG tablet TAKE 1 TABLET (80 MG TOTAL) BY MOUTH EVERY EVENING.  . tamsulosin (FLOMAX) 0.4 MG CAPS capsule Take 1 capsule by mouth daily.    PHQ 2/9 Scores 11/29/2016 08/07/2015 03/14/2015 02/10/2015  PHQ - 2 Score 0 0 0 1  PHQ- 9 Score 0 - - -    Physical Exam  Constitutional: He is oriented to person, place, and time. He appears well-developed and well-nourished.  HENT:  Head: Normocephalic.  Right Ear: External ear normal.  Left Ear: External ear normal.  Nose: Nose normal.  Mouth/Throat: Oropharynx is clear and moist.  Eyes: Pupils are equal, round, and reactive to light.  Conjunctivae and EOM are normal. Right eye exhibits no discharge. Left eye exhibits no discharge. No scleral icterus.  Neck: Normal range of motion. Neck supple. No JVD present. No tracheal deviation present. No thyromegaly present.  Cardiovascular: Normal rate, regular rhythm, normal heart sounds and intact distal pulses. Exam reveals no gallop and no friction rub.  No murmur heard.  No systolic murmur is present.  No diastolic murmur is present. Pulmonary/Chest: Breath sounds normal. No respiratory distress. He has no wheezes. He has no rales.  Abdominal: Soft. Bowel sounds are normal. He exhibits no mass. There is no hepatosplenomegaly. There is no tenderness. There is no rebound, no guarding and no CVA tenderness.  Musculoskeletal: Normal range of motion. He exhibits no edema or tenderness.  Lymphadenopathy:    He has no cervical adenopathy.  Neurological: He is alert and oriented to person, place, and time. He has normal strength and normal reflexes. He is not disoriented. No cranial nerve deficit or sensory deficit.  Reflex Scores:      Tricep reflexes are 2+ on the right side and 2+ on the left side.      Bicep reflexes are 2+ on the right side and 2+ on the left side.      Brachioradialis reflexes are 2+ on the right side and 2+ on the left side.      Patellar reflexes are 2+ on the right side and 2+  on the left side.      Achilles reflexes are 2+ on the right side and 2+ on the left side. Skin: Skin is warm. No rash noted.  Nursing note and vitals reviewed.   BP 138/60   Pulse 60   Ht _0  (1.803 m)   Wt 192 lb (87.1 kg)   BMI 26.78 kg/m   Assessment and Plan:  1. TIA (transient ischemic attack) Simular to previous episode. Recent asa cessation. Will refer to neurology and resume asa 81 mg. - Ambulatory referral to Neurology  2. Atrial flutter, paroxysmal (HCC) Present not on anticoagulation and in sinus rhythm.  3. Cardiomyopathy, unspecified type ([place]) Patient did  not pick up new dosage of lisinopril nor refill coreg when ran out. Carvedilol and lisinopril called in.  - carvedilol (COREG) 6.25 MG tablet; Take 1 tablet (6.25 mg total) by mouth 2 (two) times daily with a meal.  Dispense: 30 tablet; Refill: 4 - lisinopril (PRINIVIL,ZESTRIL) 10 MG tablet; Take 1 tablet (10 mg total) by mouth daily.  Dispense: 30 tablet; Refill: 4  4. Transient alteration of awareness Seem to have significant decrease I mental acuity. - Ambulatory referral to Neurology   Dr. Otilio Miu [facility]  01/11/2018

## 2018-01-25 DIAGNOSIS — G459 Transient cerebral ischemic attack, unspecified: Secondary | ICD-10-CM | POA: Diagnosis not present

## 2018-01-25 DIAGNOSIS — I4892 Unspecified atrial flutter: Secondary | ICD-10-CM | POA: Diagnosis not present

## 2018-01-25 DIAGNOSIS — R001 Bradycardia, unspecified: Secondary | ICD-10-CM | POA: Diagnosis not present

## 2018-01-25 DIAGNOSIS — I5022 Chronic systolic (congestive) heart failure: Secondary | ICD-10-CM | POA: Diagnosis not present

## 2018-01-27 ENCOUNTER — Other Ambulatory Visit: Payer: Self-pay | Admitting: Family Medicine

## 2018-01-27 DIAGNOSIS — E119 Type 2 diabetes mellitus without complications: Secondary | ICD-10-CM

## 2018-02-01 ENCOUNTER — Other Ambulatory Visit: Payer: Self-pay | Admitting: Family Medicine

## 2018-02-01 DIAGNOSIS — E785 Hyperlipidemia, unspecified: Principal | ICD-10-CM

## 2018-02-01 DIAGNOSIS — E1169 Type 2 diabetes mellitus with other specified complication: Secondary | ICD-10-CM

## 2018-02-06 ENCOUNTER — Ambulatory Visit (INDEPENDENT_AMBULATORY_CARE_PROVIDER_SITE_OTHER): Payer: Medicare HMO | Admitting: Family Medicine

## 2018-02-06 ENCOUNTER — Encounter: Payer: Self-pay | Admitting: Family Medicine

## 2018-02-06 VITALS — BP 122/60 | HR 64 | Ht 71.0 in | Wt 194.0 lb

## 2018-02-06 DIAGNOSIS — E1169 Type 2 diabetes mellitus with other specified complication: Secondary | ICD-10-CM

## 2018-02-06 DIAGNOSIS — R69 Illness, unspecified: Secondary | ICD-10-CM

## 2018-02-06 DIAGNOSIS — N401 Enlarged prostate with lower urinary tract symptoms: Secondary | ICD-10-CM | POA: Diagnosis not present

## 2018-02-06 DIAGNOSIS — I429 Cardiomyopathy, unspecified: Secondary | ICD-10-CM | POA: Diagnosis not present

## 2018-02-06 DIAGNOSIS — E119 Type 2 diabetes mellitus without complications: Secondary | ICD-10-CM

## 2018-02-06 DIAGNOSIS — E785 Hyperlipidemia, unspecified: Secondary | ICD-10-CM | POA: Diagnosis not present

## 2018-02-06 DIAGNOSIS — R195 Other fecal abnormalities: Secondary | ICD-10-CM

## 2018-02-06 MED ORDER — FISH OIL 1000 MG PO CAPS: 1.0000 | ORAL_CAPSULE | Freq: Every day | ORAL | 3 refills | Status: DC

## 2018-02-06 MED ORDER — SIMVASTATIN 80 MG PO TABS: 80.0000 mg | ORAL_TABLET | Freq: Every evening | ORAL | 3 refills | Status: DC

## 2018-02-06 MED ORDER — LISINOPRIL 10 MG PO TABS: 10.0000 mg | ORAL_TABLET | Freq: Every day | ORAL | 3 refills | Status: DC

## 2018-02-06 MED ORDER — METFORMIN HCL 1000 MG PO TABS: 1000.0000 mg | ORAL_TABLET | Freq: Two times a day (BID) | ORAL | 3 refills | Status: DC

## 2018-02-06 MED ORDER — TAMSULOSIN HCL 0.4 MG PO CAPS: 0.4000 mg | ORAL_CAPSULE | Freq: Every day | ORAL | 6 refills | Status: DC

## 2018-02-06 MED ORDER — CARVEDILOL 6.25 MG PO TABS: 6.2500 mg | ORAL_TABLET | Freq: Two times a day (BID) | ORAL | 4 refills | Status: DC

## 2018-02-06 NOTE — Progress Notes (Signed)
Date:  02/06/2018   Name:  Cody Howard   DOB:  09-04-1934   MRN:  [id number]   Chief Complaint: Diabetes; Hyperlipidemia; Hypertension; and Benign Prostatic Hypertrophy ("not emptying completely"- wants referral to [facility]) Diabetes  He presents for his follow-up diabetic visit. He has type 2 diabetes mellitus. His disease course has been stable. There are no hypoglycemic associated symptoms. Pertinent negatives for hypoglycemia include no dizziness, headaches or nervousness/anxiousness. There are no diabetic associated symptoms. Pertinent negatives for diabetes include no chest pain, no polyphagia and no polyuria. There are no hypoglycemic complications. Symptoms are stable. There are no diabetic complications. Pertinent negatives for diabetic complications include no CVA, PVD or retinopathy. Risk factors for coronary artery disease include dyslipidemia, diabetes mellitus, hypertension, male sex and post-menopausal. Current diabetic treatment includes oral agent (monotherapy). He is compliant with treatment some of the time. His weight is stable. He is following a generally healthy diet. Meal planning includes avoidance of concentrated sweets and carbohydrate counting. He participates in exercise daily. His home blood glucose trend is increasing steadily. His breakfast blood glucose is taken between 8-9 am. His breakfast blood glucose range is generally 140-180 mg/dl. An ACE inhibitor/angiotensin II receptor blocker is being taken.  Hyperlipidemia  This is a chronic problem. The current episode started more than 1 year ago. The problem is controlled. Recent lipid tests were reviewed and are normal. He has no history of chronic renal disease. Pertinent negatives include no chest pain, myalgias or shortness of breath. Current antihyperlipidemic treatment includes statins. The current treatment provides moderate improvement of lipids. There are no compliance problems.  Risk factors for coronary artery  disease include diabetes mellitus, dyslipidemia, hypertension and male sex.  Hypertension  This is a chronic problem. The current episode started more than 1 year ago. The problem is unchanged. The problem is controlled. Pertinent negatives include no chest pain, headaches, neck pain, palpitations or shortness of breath. There are no associated agents to hypertension. Past treatments include ACE inhibitors. The current treatment provides moderate improvement. There are no compliance problems.  There is no history of angina, kidney disease, CAD/MI, CVA, heart failure, left ventricular hypertrophy, PVD or retinopathy. There is no history of chronic renal disease, a hypertension causing med or renovascular disease.  Benign Prostatic Hypertrophy  This is a chronic problem. The current episode started more than 1 year ago. The problem has been waxing and waning since onset. Irritative symptoms do not include frequency, nocturia or urgency. Obstructive symptoms do not include an intermittent stream. Pertinent negatives include no chills, dysuria, hematuria or nausea. Past treatments include tamsulosin and doxazosin. The treatment provided moderate relief.     Review of Systems  Constitutional: Negative for chills and fever.  HENT: Negative for drooling, ear discharge, ear pain, nosebleeds, postnasal drip, sinus pain and sore throat.   Respiratory: Negative for cough, chest tightness, shortness of breath, wheezing and stridor.   Cardiovascular: Negative for chest pain, palpitations and leg swelling.  Gastrointestinal: Negative for abdominal pain, blood in stool, constipation, diarrhea and nausea.  Endocrine: Negative for polyphagia and polyuria.  Genitourinary: Positive for difficulty urinating. Negative for dysuria, frequency, hematuria, nocturia, penile pain and urgency.  Musculoskeletal: Negative for back pain, joint swelling, myalgias and neck pain.  Skin: Negative for rash.  Allergic/Immunologic:  Negative for environmental allergies.  Neurological: Negative for dizziness and headaches.  Hematological: Negative for adenopathy. Does not bruise/bleed easily.  Psychiatric/Behavioral: Negative for suicidal ideas. The patient is not nervous/anxious.  Patient Active Problem List   Diagnosis Date Noted  . CKD (chronic kidney disease) stage 3, GFR 30-59 ml/min (HCC) 08/18/2017  . Bradycardia 08/18/2017  . Atrial flutter, paroxysmal ([place]) 07/21/2017  . Functional dyspnea 07/06/2017  . Cardiomyopathy ([place]) 07/06/2017  . Hyperlipidemia, mixed 06/20/2017  . Hyperlipidemia associated with type 2 diabetes mellitus ([place]) 04/06/2016  . Type 2 diabetes mellitus without complications ([place]) 77/05/4033  . Personal history of other diseases of male genital organs 02/10/2015  . Sebaceous cyst 08/28/2014    No Known Allergies  Past Surgical History:  Procedure Laterality Date  . COLONOSCOPY N/A 02/26/2015   Procedure: COLONOSCOPY;  Surgeon: Hulen Luster, MD;  Location: [place];  Service: Gastroenterology;  Laterality: N/A;  . COLONOSCOPY WITH PROPOFOL N/A 11/02/2017   Procedure: COLONOSCOPY WITH PROPOFOL;  Surgeon: Toledo, Benay Pike, MD;  Location: [facility];  Service: Gastroenterology;  Laterality: N/A;  . ESOPHAGOGASTRODUODENOSCOPY N/A 02/26/2015   Procedure: ESOPHAGOGASTRODUODENOSCOPY (EGD);  Surgeon: Hulen Luster, MD;  Location: [place];  Service: Gastroenterology;  Laterality: N/A;  Diabetic - oral meds  . ESOPHAGOGASTRODUODENOSCOPY (EGD) WITH PROPOFOL N/A 11/02/2017   Procedure: ESOPHAGOGASTRODUODENOSCOPY (EGD) WITH PROPOFOL;  Surgeon: Toledo, Benay Pike, MD;  Location: [facility];  Service: Gastroenterology;  Laterality: N/A;  . KIDNEY SURGERY Right    surgery when 83 years old  . POLYPECTOMY  02/26/2015   Procedure: POLYPECTOMY;  Surgeon: Hulen Luster, MD;  Location: [place];  Service: Gastroenterology;;  . sebaceous cyst removal Right    located right  of spine on upper back    Social History   Tobacco Use  . Smoking status: Former Smoker    Last attempt to quit: 03/08/1978    Years since quitting: 39.9  . Smokeless tobacco: Never Used  Substance Use Topics  . Alcohol use: Yes    Alcohol/week: 0.0 standard drinks    Comment: Rarely  . Drug use: No     Medication list has been reviewed and updated.  Current Meds  Medication Sig  . ACCU-CHEK SOFTCLIX LANCETS lancets TEST BLOOD SUGAR EVERY DAY  . Blood Glucose Monitoring Suppl (ACCU-CHEK AVIVA PLUS) w/Device KIT USE AS DIRECTED  . carvedilol (COREG) 6.25 MG tablet Take 1 tablet (6.25 mg total) by mouth 2 (two) times daily with a meal.  . glucose blood (ACCU-CHEK AVIVA PLUS) test strip 1 each by Other route daily. Use as instructed  . lisinopril (PRINIVIL,ZESTRIL) 10 MG tablet Take 1 tablet (10 mg total) by mouth daily.  . metFORMIN (GLUCOPHAGE) 1000 MG tablet TAKE 1 TABLET BY MOUTH TWICE A DAY  . montelukast (SINGULAIR) 10 MG tablet TAKE 1 TABLET BY MOUTH EVERYDAY AT BEDTIME  . Omega-3 Fatty Acids (FISH OIL) 1000 MG CAPS Take 1 capsule (1,000 mg total) by mouth daily.  . simvastatin (ZOCOR) 80 MG tablet TAKE 1 TABLET (80 MG TOTAL) BY MOUTH EVERY EVENING.  . tamsulosin (FLOMAX) 0.4 MG CAPS capsule Take 1 capsule by mouth daily.    PHQ 2/9 Scores 11/29/2016 08/07/2015 03/14/2015 02/10/2015  PHQ - 2 Score 0 0 0 1  PHQ- 9 Score 0 - - -    Physical Exam  Constitutional: He is oriented to person, place, and time.  HENT:  Head: Normocephalic.  Right Ear: External ear normal.  Left Ear: External ear normal.  Nose: Nose normal.  Mouth/Throat: Oropharynx is clear and moist.  Eyes: Pupils are equal, round, and reactive to light. Conjunctivae and EOM are normal. Right eye exhibits no  discharge. Left eye exhibits no discharge. No scleral icterus.  Neck: Normal range of motion. Neck supple. No JVD present. No tracheal deviation present. No thyromegaly present.  Cardiovascular: Normal rate,  regular rhythm, S1 normal, S2 normal, normal heart sounds, intact distal pulses and normal pulses. PMI is not displaced. Exam reveals no gallop, no S3, no S4 and no friction rub.  No murmur heard.  No systolic murmur is present.  No diastolic murmur is present. Pulmonary/Chest: Breath sounds normal. No stridor. No respiratory distress. He has no decreased breath sounds. He has no wheezes. He has no rales.  Abdominal: Soft. Bowel sounds are normal. He exhibits no mass. There is no hepatosplenomegaly. There is no tenderness. There is no rebound, no guarding and no CVA tenderness.  Genitourinary: Rectal exam shows guaiac positive stool. Rectal exam shows no mass. Prostate is not enlarged and not tender.  Musculoskeletal: Normal range of motion. He exhibits no edema or tenderness.  Lymphadenopathy:    He has no cervical adenopathy.  Neurological: He is alert and oriented to person, place, and time. He has normal strength and normal reflexes. No cranial nerve deficit.  Skin: Skin is warm. No rash noted.  Nursing note and vitals reviewed.   BP 122/60   Pulse 64   Ht _0  (1.803 m)   Wt 194 lb (88 kg)   BMI 27.06 kg/m   Assessment and Plan: 1. Type 2 diabetes mellitus without complication, without long-term current use of insulin ([place]) Chronic-year-old continue metformin 1 g twice a day.  Will check A1c and renal function panel. - HgB A1c - Renal Function Panel - metFORMIN (GLUCOPHAGE) 1000 MG tablet; Take 1 tablet (1,000 mg total) by mouth 2 (two) times daily.  Dispense: 60 tablet; Refill: 3  2. Hyperlipidemia associated with type 2 diabetes mellitus (HCC) Chronic.  Trolled.  Continue simvastatin 80 mg 1 a day.  Continue omega-3 fatty acid over-the-counter 1 g daily we will check lipid panel with  ratio - Lipid Panel With LDL/HDL Ratio - Renal Function Panel - simvastatin (ZOCOR) 80 MG tablet; Take 1 tablet (80 mg total) by mouth every evening.  Dispense: 30 tablet; Refill: 3 - Omega-3  Fatty Acids (FISH OIL) 1000 MG CAPS; Take 1 capsule (1,000 mg total) by mouth daily.  Dispense: 90 capsule; Refill: 3  3. Benign prostatic hyperplasia with lower urinary tract symptoms, symptom details unspecified Chronic.  Recent exacerbation of symptoms.  Will refill Flomax 0.4one a day.  Will check PSA and refer to urology for evaluation. - PSA - Ambulatory referral to Urology - tamsulosin (FLOMAX) 0.4 MG CAPS capsule; Take 1 capsule (0.4 mg total) by mouth daily.  Dispense: 30 capsule; Refill: 6  4. Taking medication for chronic disease Patient presently on a statin and will check hepatic function. - Hepatic function panel  5. Guaiac positive stools Patient with guaiac positive stool.  Reviewed recent endoscopy and colonoscopy results.  Will check CBC to assess current hemoglobin status. - CBC with Differential/Platelet  6. Cardiomyopathy, unspecified type ([place]) Chronic.  Stable.  Continue lisinopril 10 mg daily and carvedilol 6.25 mg twice a day. - lisinopril (PRINIVIL,ZESTRIL) 10 MG tablet; Take 1 tablet (10 mg total) by mouth daily.  Dispense: 30 tablet; Refill: 3 - carvedilol (COREG) 6.25 MG tablet; Take 1 tablet (6.25 mg total) by mouth 2 (two) times daily with a meal.  Dispense: 30 tablet; Refill: 4   Dr. [facility]  02/06/2018

## 2018-02-07 LAB — CBC WITH DIFFERENTIAL/PLATELET
Basophils Absolute: 0 10*3/uL (ref 0.0–0.2)
Basos: 0 %
EOS (ABSOLUTE): 0.1 10*3/uL (ref 0.0–0.4)
Eos: 2 %
Hematocrit: 32.3 % — ABNORMAL LOW (ref 37.5–51.0)
Hemoglobin: 11.3 g/dL — ABNORMAL LOW (ref 13.0–17.7)
Immature Grans (Abs): 0 10*3/uL (ref 0.0–0.1)
Immature Granulocytes: 0 %
Lymphocytes Absolute: 1.7 10*3/uL (ref 0.7–3.1)
Lymphs: 26 %
MCH: 33.3 pg — ABNORMAL HIGH (ref 26.6–33.0)
MCHC: 35 g/dL (ref 31.5–35.7)
MCV: 95 fL (ref 79–97)
MONOS ABS: 0.5 10*3/uL (ref 0.1–0.9)
Monocytes: 8 %
Neutrophils Absolute: 4.3 10*3/uL (ref 1.4–7.0)
Neutrophils: 64 %
PLATELETS: 165 10*3/uL (ref 150–450)
RBC: 3.39 x10E6/uL — ABNORMAL LOW (ref 4.14–5.80)
RDW: 12.7 % (ref 12.3–15.4)
WBC: 6.7 10*3/uL (ref 3.4–10.8)

## 2018-02-07 LAB — RENAL FUNCTION PANEL
ALBUMIN: 4.4 g/dL (ref 3.5–4.7)
BUN/Creatinine Ratio: 17 (ref 10–24)
BUN: 32 mg/dL — ABNORMAL HIGH (ref 8–27)
CALCIUM: 9.2 mg/dL (ref 8.6–10.2)
CO2: 20 mmol/L (ref 20–29)
CREATININE: 1.83 mg/dL — AB (ref 0.76–1.27)
Chloride: 107 mmol/L — ABNORMAL HIGH (ref 96–106)
GFR calc Af Amer: 39 mL/min/{1.73_m2} — ABNORMAL LOW (ref >59)
GFR, EST NON AFRICAN AMERICAN: 34 mL/min/{1.73_m2} — AB (ref >59)
Glucose: 129 mg/dL — ABNORMAL HIGH (ref 65–99)
PHOSPHORUS: 3.1 mg/dL (ref 2.5–4.5)
POTASSIUM: 5.7 mmol/L — AB (ref 3.5–5.2)
Sodium: 142 mmol/L (ref 134–144)

## 2018-02-07 LAB — HEMOGLOBIN A1C
ESTIMATED AVERAGE GLUCOSE: 143 mg/dL
HEMOGLOBIN A1C: 6.6 % — AB (ref 4.8–5.6)

## 2018-02-07 LAB — PSA: Prostate Specific Ag, Serum: 0.4 ng/mL (ref 0.0–4.0)

## 2018-02-07 LAB — HEPATIC FUNCTION PANEL
ALT: 15 IU/L (ref 0–44)
AST: 18 IU/L (ref 0–40)
Alkaline Phosphatase: 73 IU/L (ref 39–117)
BILIRUBIN, DIRECT: 0.1 mg/dL (ref 0.00–0.40)
Bilirubin Total: 0.3 mg/dL (ref 0.0–1.2)
TOTAL PROTEIN: 6.4 g/dL (ref 6.0–8.5)

## 2018-02-07 LAB — LIPID PANEL WITH LDL/HDL RATIO
Cholesterol, Total: 158 mg/dL (ref 100–199)
HDL: 58 mg/dL (ref >39)
LDL Calculated: 82 mg/dL (ref 0–99)
LDl/HDL Ratio: 1.4 ratio (ref 0.0–3.6)
TRIGLYCERIDES: 92 mg/dL (ref 0–149)
VLDL Cholesterol Cal: 18 mg/dL (ref 5–40)

## 2018-02-14 DIAGNOSIS — R69 Illness, unspecified: Secondary | ICD-10-CM | POA: Diagnosis not present

## 2018-02-20 DIAGNOSIS — G459 Transient cerebral ischemic attack, unspecified: Secondary | ICD-10-CM | POA: Diagnosis not present

## 2018-02-23 DIAGNOSIS — I5022 Chronic systolic (congestive) heart failure: Secondary | ICD-10-CM | POA: Diagnosis not present

## 2018-02-27 DIAGNOSIS — I420 Dilated cardiomyopathy: Secondary | ICD-10-CM | POA: Diagnosis not present

## 2018-02-27 DIAGNOSIS — E119 Type 2 diabetes mellitus without complications: Secondary | ICD-10-CM | POA: Diagnosis not present

## 2018-02-27 DIAGNOSIS — I4892 Unspecified atrial flutter: Secondary | ICD-10-CM | POA: Diagnosis not present

## 2018-02-27 DIAGNOSIS — G459 Transient cerebral ischemic attack, unspecified: Secondary | ICD-10-CM | POA: Diagnosis not present

## 2018-02-27 DIAGNOSIS — I5022 Chronic systolic (congestive) heart failure: Secondary | ICD-10-CM | POA: Diagnosis not present

## 2018-03-09 ENCOUNTER — Encounter: Payer: Self-pay | Admitting: Family Medicine

## 2018-03-09 ENCOUNTER — Ambulatory Visit (INDEPENDENT_AMBULATORY_CARE_PROVIDER_SITE_OTHER): Payer: Medicare HMO | Admitting: Family Medicine

## 2018-03-09 VITALS — BP 130/62 | HR 68 | Ht 71.0 in | Wt 198.0 lb

## 2018-03-09 DIAGNOSIS — R05 Cough: Secondary | ICD-10-CM | POA: Diagnosis not present

## 2018-03-09 DIAGNOSIS — J40 Bronchitis, not specified as acute or chronic: Secondary | ICD-10-CM | POA: Diagnosis not present

## 2018-03-09 DIAGNOSIS — R058 Other specified cough: Secondary | ICD-10-CM

## 2018-03-09 DIAGNOSIS — N183 Chronic kidney disease, stage 3 unspecified: Secondary | ICD-10-CM

## 2018-03-09 DIAGNOSIS — I429 Cardiomyopathy, unspecified: Secondary | ICD-10-CM

## 2018-03-09 DIAGNOSIS — T464X5A Adverse effect of angiotensin-converting-enzyme inhibitors, initial encounter: Secondary | ICD-10-CM

## 2018-03-09 MED ORDER — BENZONATATE 100 MG PO CAPS: 100.0000 mg | ORAL_CAPSULE | Freq: Two times a day (BID) | ORAL | 0 refills | Status: DC

## 2018-03-09 MED ORDER — LOSARTAN POTASSIUM 50 MG PO TABS: 50.0000 mg | ORAL_TABLET | Freq: Every day | ORAL | 0 refills | Status: DC

## 2018-03-09 MED ORDER — FUROSEMIDE 20 MG PO TABS: 20.0000 mg | ORAL_TABLET | Freq: Every day | ORAL | 3 refills | Status: DC

## 2018-03-09 NOTE — Progress Notes (Addendum)
Date:  03/09/2018   Name:  Cody Howard   DOB:  27-Sep-1934   MRN:  [id number]   Chief Complaint: Sinusitis (nose running for 3 months)  Sinusitis  This is a new problem. The current episode started 1 to 4 weeks ago (past month). The problem has been waxing and waning since onset. There has been no fever. Associated symptoms include coughing. Pertinent negatives include no chills, congestion, diaphoresis, ear pain, headaches, hoarse voice, neck pain, shortness of breath, sinus pressure, sneezing, sore throat or swollen glands. Past treatments include nothing. The treatment provided moderate relief.    Review of Systems  Constitutional: Negative for chills, diaphoresis and fever.  HENT: Negative for congestion, drooling, ear discharge, ear pain, hoarse voice, sinus pressure, sneezing and sore throat.   Respiratory: Positive for cough. Negative for apnea, choking, chest tightness, shortness of breath, wheezing and stridor.   Cardiovascular: Negative for chest pain, palpitations and leg swelling.  Gastrointestinal: Negative for abdominal pain, blood in stool, constipation, diarrhea and nausea.  Endocrine: Negative for polydipsia.  Genitourinary: Negative for dysuria, frequency, hematuria and urgency.  Musculoskeletal: Negative for back pain, myalgias and neck pain.  Skin: Negative for rash.  Allergic/Immunologic: Negative for environmental allergies.  Neurological: Negative for dizziness and headaches.  Hematological: Does not bruise/bleed easily.  Psychiatric/Behavioral: Negative for suicidal ideas. The patient is not nervous/anxious.     Patient Active Problem List   Diagnosis Date Noted  . CKD (chronic kidney disease) stage 3, GFR 30-59 ml/min (HCC) 08/18/2017  . Bradycardia 08/18/2017  . Atrial flutter, paroxysmal ([place]) 07/21/2017  . Functional dyspnea 07/06/2017  . Cardiomyopathy ([place]) 07/06/2017  . Hyperlipidemia, mixed 06/20/2017  . Hyperlipidemia associated with type 2  diabetes mellitus ([place]) 04/06/2016  . Type 2 diabetes mellitus without complications ([place]) 75/91/6384  . Personal history of other diseases of male genital organs 02/10/2015  . Sebaceous cyst 08/28/2014    No Known Allergies  Past Surgical History:  Procedure Laterality Date  . COLONOSCOPY N/A 02/26/2015   Procedure: COLONOSCOPY;  Surgeon: Hulen Luster, MD;  Location: [facility];  Service: Gastroenterology;  Laterality: N/A;  . COLONOSCOPY WITH PROPOFOL N/A 11/02/2017   Procedure: COLONOSCOPY WITH PROPOFOL;  Surgeon: Toledo, Benay Pike, MD;  Location: [facility];  Service: Gastroenterology;  Laterality: N/A;  . ESOPHAGOGASTRODUODENOSCOPY N/A 02/26/2015   Procedure: ESOPHAGOGASTRODUODENOSCOPY (EGD);  Surgeon: Hulen Luster, MD;  Location: [facility];  Service: Gastroenterology;  Laterality: N/A;  Diabetic - oral meds  . ESOPHAGOGASTRODUODENOSCOPY (EGD) WITH PROPOFOL N/A 11/02/2017   Procedure: ESOPHAGOGASTRODUODENOSCOPY (EGD) WITH PROPOFOL;  Surgeon: Toledo, Benay Pike, MD;  Location: [facility];  Service: Gastroenterology;  Laterality: N/A;  . KIDNEY SURGERY Right    surgery when 84 years old  . POLYPECTOMY  02/26/2015   Procedure: POLYPECTOMY;  Surgeon: Hulen Luster, MD;  Location: [place];  Service: Gastroenterology;;  . sebaceous cyst removal Right    located right of spine on upper back    Social History   Tobacco Use  . Smoking status: Former Smoker    Last attempt to quit: 03/08/1978    Years since quitting: 40.0  . Smokeless tobacco: Never Used  Substance Use Topics  . Alcohol use: Yes    Alcohol/week: 0.0 standard drinks    Comment: Rarely  . Drug use: No     Medication list has been reviewed and updated.  Current Meds  Medication Sig  . ACCU-CHEK SOFTCLIX LANCETS lancets TEST BLOOD SUGAR EVERY  DAY  . Blood Glucose Monitoring Suppl (ACCU-CHEK AVIVA PLUS) w/Device KIT USE AS DIRECTED  . carvedilol (COREG) 6.25 MG tablet Take 1 tablet (6.25 mg  total) by mouth 2 (two) times daily with a meal.  . glucose blood (ACCU-CHEK AVIVA PLUS) test strip 1 each by Other route daily. Use as instructed  . lisinopril (PRINIVIL,ZESTRIL) 10 MG tablet Take 1 tablet (10 mg total) by mouth daily.  . metFORMIN (GLUCOPHAGE) 1000 MG tablet Take 1 tablet (1,000 mg total) by mouth 2 (two) times daily.  . montelukast (SINGULAIR) 10 MG tablet TAKE 1 TABLET BY MOUTH EVERYDAY AT BEDTIME  . Omega-3 Fatty Acids (FISH OIL) 1000 MG CAPS Take 1 capsule (1,000 mg total) by mouth daily.  . simvastatin (ZOCOR) 80 MG tablet Take 1 tablet (80 mg total) by mouth every evening.  . tamsulosin (FLOMAX) 0.4 MG CAPS capsule Take 1 capsule (0.4 mg total) by mouth daily.    PHQ 2/9 Scores 11/29/2016 08/07/2015 03/14/2015 02/10/2015  PHQ - 2 Score 0 0 0 1  PHQ- 9 Score 0 - - -    Physical Exam Vitals signs and nursing note reviewed.  HENT:     Head: Normocephalic.     Right Ear: External ear normal.     Left Ear: External ear normal.     Nose: Nose normal.  Eyes:     General: No scleral icterus.       Right eye: No discharge.        Left eye: No discharge.     Conjunctiva/sclera: Conjunctivae normal.     Pupils: Pupils are equal, round, and reactive to light.  Neck:     Musculoskeletal: Normal range of motion and neck supple.     Thyroid: No thyromegaly.     Vascular: No JVD.     Trachea: No tracheal deviation.  Cardiovascular:     Rate and Rhythm: Normal rate and regular rhythm.     Heart sounds: Normal heart sounds, S1 normal and S2 normal. Heart sounds not distant. No murmur. No systolic murmur. No diastolic murmur. No friction rub. No gallop. No S3 or S4 sounds.   Pulmonary:     Effort: No respiratory distress.     Breath sounds: Normal breath sounds. No wheezing or rales.  Abdominal:     General: Bowel sounds are normal.     Palpations: Abdomen is soft. There is no mass.     Tenderness: There is no abdominal tenderness. There is no guarding or rebound.    Musculoskeletal: Normal range of motion.        General: No tenderness.     Right lower leg: No edema.     Left lower leg: No edema.  Lymphadenopathy:     Cervical: No cervical adenopathy.  Skin:    General: Skin is warm.     Findings: No rash.  Neurological:     Mental Status: He is alert and oriented to person, place, and time.     Cranial Nerves: No cranial nerve deficit.     Deep Tendon Reflexes: Reflexes are normal and symmetric.     BP 130/62   Pulse 68   Ht _0  (1.803 m)   Wt 198 lb (89.8 kg)   BMI 27.62 kg/m   Assessment and Plan: 1. Cough due to ACE inhibitor Patient's had 3 to 4 weeks of a cough nonproductive feels like a tickle in his throat.  Patient has been on lisinopril 10 mg for cardiomyopathy.  Will switch to losartan 50 mg and will recheck blood pressure in 3 to 4 weeks.  2. Bronchitis Patient has had a persistent cough may be due to the ACE or may be a viral bronchitis will give him some Tessalon Perles for the symptoms. - benzonatate (TESSALON) 100 MG capsule; Take 1 capsule (100 mg total) by mouth 2 (two) times daily.  Dispense: 20 capsule; Refill: 0  3. Cardiomyopathy, unspecified type ([place]) And has a cardiomyopathy for which he was on ACE noted that patient has had a 4 pound weight gain over the holidays which may be fluid related furosemide was initiated 20 mg and will recheck weight and symptoms in 4 weeks - losartan (COZAAR) 50 MG tablet; Take 1 tablet (50 mg total) by mouth daily.  Dispense: 30 tablet; Refill: 0 - furosemide (LASIX) 20 MG tablet; Take 1 tablet (20 mg total) by mouth daily.  Dispense: 30 tablet; Refill: 3  4. CKD (chronic kidney disease) stage 3, GFR 30-59 ml/min (HCC) Patient has a history of CKD and will reduce a low dose of Lasix some additional fluid off. - losartan (COZAAR) 50 MG tablet; Take 1 tablet (50 mg total) by mouth daily.  Dispense: 30 tablet; Refill: 0

## 2018-03-17 ENCOUNTER — Encounter: Payer: Self-pay | Admitting: Urology

## 2018-03-17 ENCOUNTER — Ambulatory Visit (INDEPENDENT_AMBULATORY_CARE_PROVIDER_SITE_OTHER): Payer: Medicare HMO | Admitting: Urology

## 2018-03-17 VITALS — BP 95/55 | HR 61 | Ht 71.0 in | Wt 194.7 lb

## 2018-03-17 DIAGNOSIS — N401 Enlarged prostate with lower urinary tract symptoms: Secondary | ICD-10-CM

## 2018-03-17 LAB — BLADDER SCAN AMB NON-IMAGING

## 2018-03-17 NOTE — Progress Notes (Signed)
03/17/2018 11:11 AM   Cody Howard Cody Howard September 07, 1934 [id number]  Referring provider: [name], MD [address] [place], [place] [address]  Chief Complaint  Patient presents with  . Establish Care  . Benign Prostatic Hypertrophy    HPI: 84 year old male seen at the request of Dr. [name] for exacerbation of BPH.  He saw Dr. [name] in early December 2019 and the records indicated he was complaining of worsening lower urinary tract symptoms.  He had been on tamsulosin in the past and was restarted in urology evaluation was recommended.  He states today he did not know the reason for this appointment and denies prior bothersome lower urinary tract symptoms.  IPSS completed today was 2/1.  Denies dysuria, gross hematuria or flank/abdominal/pelvic/scrotal pain.  Denies flank/abdominal/pelvic/scrotal pain.  A PSA was checked which was 0.4.    PMH: Past Medical History:  Diagnosis Date  . AF (atrial fibrillation) ([place])   . Arthritis    hands  . Chronic kidney disease   . Diabetes mellitus without complication ([place])   . Hyperlipidemia   . Prostate enlargement     Surgical History: Past Surgical History:  Procedure Laterality Date  . COLONOSCOPY N/A 02/26/2015   Procedure: COLONOSCOPY;  Surgeon: Hulen Luster, MD;  Location: [facility];  Service: Gastroenterology;  Laterality: N/A;  . COLONOSCOPY WITH PROPOFOL N/A 11/02/2017   Procedure: COLONOSCOPY WITH PROPOFOL;  Surgeon: Toledo, Benay Pike, MD;  Location: [facility];  Service: Gastroenterology;  Laterality: N/A;  . ESOPHAGOGASTRODUODENOSCOPY N/A 02/26/2015   Procedure: ESOPHAGOGASTRODUODENOSCOPY (EGD);  Surgeon: Hulen Luster, MD;  Location: [facility];  Service: Gastroenterology;  Laterality: N/A;  Diabetic - oral meds  . ESOPHAGOGASTRODUODENOSCOPY (EGD) WITH PROPOFOL N/A 11/02/2017   Procedure: ESOPHAGOGASTRODUODENOSCOPY (EGD) WITH PROPOFOL;  Surgeon: Toledo, Benay Pike, MD;  Location: [facility];   Service: Gastroenterology;  Laterality: N/A;  . KIDNEY SURGERY Right    surgery when 84 years old  . POLYPECTOMY  02/26/2015   Procedure: POLYPECTOMY;  Surgeon: Hulen Luster, MD;  Location: [facility];  Service: Gastroenterology;;  . sebaceous cyst removal Right    located right of spine on upper back    Home Medications:  Allergies as of 03/17/2018   No Known Allergies     Medication List       Accurate as of March 17, 2018 11:11 AM. Always use your most recent med list.        ACCU-CHEK AVIVA PLUS w/Device Kit USE AS DIRECTED   ACCU-CHEK SOFTCLIX LANCETS lancets TEST BLOOD SUGAR EVERY DAY   benzonatate 100 MG capsule Commonly known as:  TESSALON Take 1 capsule (100 mg total) by mouth 2 (two) times daily.   carvedilol 6.25 MG tablet Commonly known as:  COREG Take 1 tablet (6.25 mg total) by mouth 2 (two) times daily with a meal.   Fish Oil 1000 MG Caps Take 1 capsule (1,000 mg total) by mouth daily.   furosemide 20 MG tablet Commonly known as:  LASIX Take 1 tablet (20 mg total) by mouth daily.   glucose blood test strip Commonly known as:  ACCU-CHEK AVIVA PLUS 1 each by Other route daily. Use as instructed   losartan 50 MG tablet Commonly known as:  COZAAR Take 1 tablet (50 mg total) by mouth daily.   metFORMIN 1000 MG tablet Commonly known as:  GLUCOPHAGE Take 1 tablet (1,000 mg total) by mouth 2 (two) times daily.   montelukast 10 MG tablet Commonly  known as:  SINGULAIR TAKE 1 TABLET BY MOUTH EVERYDAY AT BEDTIME   simvastatin 80 MG tablet Commonly known as:  ZOCOR Take 1 tablet (80 mg total) by mouth every evening.   tamsulosin 0.4 MG Caps capsule Commonly known as:  FLOMAX Take 1 capsule (0.4 mg total) by mouth daily.       Allergies: No Known Allergies  Family History: Family History  Problem Relation Age of Onset  . Diabetes Father   . Heart disease Father     Social History:  reports that he quit smoking about 40 years ago.  He has never used smokeless tobacco. He reports current alcohol use. He reports that he does not use drugs.  ROS: UROLOGY Frequent Urination?: No Hard to postpone urination?: No Burning/pain with urination?: No Get up at night to urinate?: No Leakage of urine?: No Urine stream starts and stops?: No Trouble starting stream?: No Do you have to strain to urinate?: No Blood in urine?: No Urinary tract infection?: No Sexually transmitted disease?: No Injury to kidneys or bladder?: No Painful intercourse?: No Weak stream?: No Erection problems?: No Penile pain?: No  Gastrointestinal Nausea?: No Vomiting?: No Indigestion/heartburn?: No Diarrhea?: No Constipation?: No  Constitutional Fever: No Night sweats?: No Weight loss?: No Fatigue?: No  Skin Skin rash/lesions?: No Itching?: No  Eyes Blurred vision?: No Double vision?: No  Ears/Nose/Throat Sore throat?: No Sinus problems?: No  Hematologic/Lymphatic Swollen glands?: No Easy bruising?: No  Cardiovascular Leg swelling?: No Chest pain?: No  Respiratory Cough?: No Shortness of breath?: No  Endocrine Excessive thirst?: No  Musculoskeletal Back pain?: No Joint pain?: No  Neurological Headaches?: No Dizziness?: No  Psychologic Depression?: No Anxiety?: No  Physical Exam: BP (!) 95/55 (BP Location: Left Arm, Patient Position: Sitting, Cuff Size: Large)   Pulse 61   Ht '5\' 11"'  (1.803 m)   Wt 194 lb 11.2 oz (88.3 kg)   BMI 27.16 kg/m   Constitutional:  Alert and oriented, No acute distress. HEENT: [place] AT, moist mucus membranes.  Trachea midline, no masses. Cardiovascular: No clubbing, cyanosis, or edema. Respiratory: Normal respiratory effort, no increased work of breathing. Neurologic: Grossly intact, no focal deficits, moving all 4 extremities. Psychiatric: Normal mood and affect.   Assessment & Plan:    1. Benign prostatic hyperplasia with lower urinary tract symptoms, symptom details  unspecified PVR by bladder scan today was 71 mL.  He has no bothersome lower urinary tract symptoms.  Would recommend he continue the tamsulosin and follow-up as needed.   Abbie Sons, [place] [address] [place], [place] [address] [phone]

## 2018-03-31 ENCOUNTER — Other Ambulatory Visit: Payer: Self-pay | Admitting: Family Medicine

## 2018-03-31 DIAGNOSIS — N183 Chronic kidney disease, stage 3 unspecified: Secondary | ICD-10-CM

## 2018-03-31 DIAGNOSIS — I429 Cardiomyopathy, unspecified: Secondary | ICD-10-CM

## 2018-04-04 ENCOUNTER — Other Ambulatory Visit
Admission: RE | Admit: 2018-04-04 | Discharge: 2018-04-04 | Disposition: A | Discharge status: Home / Self Care | Payer: Medicare HMO | Attending: Family Medicine | Admitting: Family Medicine

## 2018-04-04 ENCOUNTER — Ambulatory Visit (INDEPENDENT_AMBULATORY_CARE_PROVIDER_SITE_OTHER): Payer: Medicare HMO | Admitting: Family Medicine

## 2018-04-04 ENCOUNTER — Encounter: Payer: Self-pay | Admitting: Family Medicine

## 2018-04-04 VITALS — BP 100/60 | HR 60 | Ht 71.0 in | Wt 197.0 lb

## 2018-04-04 DIAGNOSIS — I5022 Chronic systolic (congestive) heart failure: Secondary | ICD-10-CM | POA: Diagnosis not present

## 2018-04-04 DIAGNOSIS — R55 Syncope and collapse: Secondary | ICD-10-CM | POA: Diagnosis not present

## 2018-04-04 DIAGNOSIS — R001 Bradycardia, unspecified: Secondary | ICD-10-CM | POA: Diagnosis not present

## 2018-04-04 DIAGNOSIS — D649 Anemia, unspecified: Secondary | ICD-10-CM | POA: Insufficient documentation

## 2018-04-04 LAB — CBC WITH DIFFERENTIAL/PLATELET
Abs Immature Granulocytes: 0.02 10*3/uL (ref 0.00–0.07)
BASOS ABS: 0 10*3/uL (ref 0.0–0.1)
Basophils Relative: 0 %
Eosinophils Absolute: 0.1 10*3/uL (ref 0.0–0.5)
Eosinophils Relative: 1 %
HCT: 35.8 % — ABNORMAL LOW (ref 39.0–52.0)
Hemoglobin: 11.9 g/dL — ABNORMAL LOW (ref 13.0–17.0)
Immature Granulocytes: 0 %
Lymphocytes Relative: 20 %
Lymphs Abs: 1.8 10*3/uL (ref 0.7–4.0)
MCH: 33 pg (ref 26.0–34.0)
MCHC: 33.2 g/dL (ref 30.0–36.0)
MCV: 99.2 fL (ref 80.0–100.0)
Monocytes Absolute: 0.5 10*3/uL (ref 0.1–1.0)
Monocytes Relative: 5 %
NRBC: 0 % (ref 0.0–0.2)
Neutro Abs: 6.9 10*3/uL (ref 1.7–7.7)
Neutrophils Relative %: 74 %
Platelets: 145 10*3/uL — ABNORMAL LOW (ref 150–400)
RBC: 3.61 MIL/uL — ABNORMAL LOW (ref 4.22–5.81)
RDW: 12.8 % (ref 11.5–15.5)
WBC: 9.4 10*3/uL (ref 4.0–10.5)

## 2018-04-04 NOTE — Progress Notes (Signed)
Name: Cody Howard   MRN: [id number]    DOB: 07-08-1934   Date:04/04/2018       Progress Note  Subjective  Chief Complaint  Chief Complaint  Patient presents with  . Follow-up    dehydrated/ labs- told to push fluids       Dizziness  This is a new problem. The current episode started today. The problem occurs intermittently. Progression since onset: comes and goes. Associated symptoms include fatigue and a visual change. Pertinent negatives include no abdominal pain, anorexia, arthralgias, change in bowel habit, chest pain, chills, congestion, coughing, diaphoresis, fever, headaches, joint swelling, myalgias, nausea, neck pain, numbness, rash, sore throat, swollen glands, urinary symptoms, vertigo, vomiting or weakness. Exacerbated by: stand up real quick. He has tried nothing for the symptoms.  Congestive Heart Failure  Presents for follow-up visit. Associated symptoms include fatigue, near-syncope and shortness of breath. Pertinent negatives include no abdominal pain, chest pain, chest pressure, claudication, edema, muscle weakness, nocturia, orthopnea, palpitations, paroxysmal nocturnal dyspnea or unexpected weight change. The symptoms have been stable.     No problem-specific Assessment & Plan notes found for this encounter.   Past Medical History:  Diagnosis Date  . AF (atrial fibrillation) ([place])   . Arthritis    hands  . Chronic kidney disease   . Diabetes mellitus without complication ([place])   . Hyperlipidemia   . Prostate enlargement     Past Surgical History:  Procedure Laterality Date  . COLONOSCOPY N/A 02/26/2015   Procedure: COLONOSCOPY;  Surgeon: Hulen Luster, MD;  Location: [facility];  Service: Gastroenterology;  Laterality: N/A;  . COLONOSCOPY WITH PROPOFOL N/A 11/02/2017   Procedure: COLONOSCOPY WITH PROPOFOL;  Surgeon: Toledo, Benay Pike, MD;  Location: [facility];  Service: Gastroenterology;  Laterality: N/A;  . ESOPHAGOGASTRODUODENOSCOPY N/A  02/26/2015   Procedure: ESOPHAGOGASTRODUODENOSCOPY (EGD);  Surgeon: Hulen Luster, MD;  Location: [facility];  Service: Gastroenterology;  Laterality: N/A;  Diabetic - oral meds  . ESOPHAGOGASTRODUODENOSCOPY (EGD) WITH PROPOFOL N/A 11/02/2017   Procedure: ESOPHAGOGASTRODUODENOSCOPY (EGD) WITH PROPOFOL;  Surgeon: Toledo, Benay Pike, MD;  Location: [facility];  Service: Gastroenterology;  Laterality: N/A;  . KIDNEY SURGERY Right    surgery when 84 years old  . POLYPECTOMY  02/26/2015   Procedure: POLYPECTOMY;  Surgeon: Hulen Luster, MD;  Location: [facility];  Service: Gastroenterology;;  . sebaceous cyst removal Right    located right of spine on upper back    Family History  Problem Relation Age of Onset  . Diabetes Father   . Heart disease Father     Social History   Socioeconomic History  . Marital status: Married    Spouse name: Not on file  . Number of children: Not on file  . Years of education: Not on file  . Highest education level: Not on file  Occupational History  . Not on file  Social Needs  . Financial resource strain: Not on file  . Food insecurity:    Worry: Not on file    Inability: Not on file  . Transportation needs:    Medical: Not on file    Non-medical: Not on file  Tobacco Use  . Smoking status: Former Smoker    Last attempt to quit: 03/08/1978    Years since quitting: 40.1  . Smokeless tobacco: Never Used  Substance and Sexual Activity  . Alcohol use: Yes    Alcohol/week: 0.0 standard drinks    Comment: Rarely  . Drug  use: No  . Sexual activity: Not Currently  Lifestyle  . Physical activity:    Days per week: Not on file    Minutes per session: Not on file  . Stress: Not on file  Relationships  . Social connections:    Talks on phone: Not on file    Gets together: Not on file    Attends religious service: Not on file    Active member of club or organization: Not on file    Attends meetings of clubs or organizations: Not on file     Relationship status: Not on file  . Intimate partner violence:    Fear of current or ex partner: Not on file    Emotionally abused: Not on file    Physically abused: Not on file    Forced sexual activity: Not on file  Other Topics Concern  . Not on file  Social History Narrative  . Not on file    No Known Allergies   Review of Systems  Constitutional: Positive for fatigue. Negative for chills, diaphoresis, fever and unexpected weight change.  HENT: Negative for congestion and sore throat.   Respiratory: Positive for shortness of breath. Negative for cough.   Cardiovascular: Positive for near-syncope. Negative for chest pain, palpitations and claudication.  Gastrointestinal: Negative for abdominal pain, anorexia, change in bowel habit, nausea and vomiting.  Genitourinary: Negative for nocturia.  Musculoskeletal: Negative for arthralgias, joint swelling, myalgias, muscle weakness and neck pain.  Skin: Negative for rash.  Neurological: Positive for dizziness. Negative for vertigo, weakness, numbness and headaches.     Objective  Vitals:   04/04/18 1041 04/04/18 1049 04/04/18 1053 04/04/18 1054  BP: (!) 130/48 132/60 120/60 100/60  Pulse: 60 60 60 60  Weight: 197 lb (89.4 kg)     Height: 5\' 11"  (1.803 m)       Physical Exam Vitals signs and nursing note reviewed.  HENT:     Head: Normocephalic.     Right Ear: External ear normal.     Left Ear: External ear normal.     Nose:     Right Nostril: Epistaxis present. No foreign body or septal hematoma.     Left Nostril: No foreign body, epistaxis or septal hematoma.     Right Turbinates: Not enlarged or swollen.     Left Turbinates: Not enlarged or swollen.     Comments: Excoriation right septum    Mouth/Throat:     Mouth: Mucous membranes are pale and dry.  Eyes:     General: No scleral icterus.       Right eye: No discharge.        Left eye: No discharge.     Conjunctiva/sclera: Conjunctivae normal.     Pupils:  Pupils are equal, round, and reactive to light.  Neck:     Musculoskeletal: Normal range of motion and neck supple.     Thyroid: No thyromegaly.     Vascular: No JVD.     Trachea: No tracheal deviation.  Cardiovascular:     Rate and Rhythm: Normal rate and regular rhythm.     Heart sounds: Normal heart sounds, S1 normal and S2 normal. No murmur. No systolic murmur. No diastolic murmur. No friction rub. No gallop. No S3 or S4 sounds.   Pulmonary:     Effort: No respiratory distress.     Breath sounds: Normal breath sounds. No wheezing or rales.  Abdominal:     General: Bowel sounds are normal.  Palpations: Abdomen is soft. There is no mass.     Tenderness: There is no abdominal tenderness. There is no guarding or rebound.  Genitourinary:    Rectum: Normal. Guaiac result negative. No mass, tenderness, external hemorrhoid or internal hemorrhoid.  Musculoskeletal: Normal range of motion.        General: No tenderness.     Right lower leg: No edema.     Left lower leg: No edema.  Lymphadenopathy:     Cervical: No cervical adenopathy.  Skin:    General: Skin is warm.     Findings: No rash.     Comments: Pale/nailbeds  Neurological:     Mental Status: He is alert and oriented to person, place, and time.     Cranial Nerves: No cranial nerve deficit.     Deep Tendon Reflexes: Reflexes are normal and symmetric.       Assessment & Plan  Problem List Items Addressed This Visit    None    1. Near syncope Onset.  This morning patient noted while taking a shower the sudden onset of dizziness and increasing weakness patient has been unsteady with his gait is particularly bothered with change in position on lying to sitting to standing.  Orthostatic vitals notes a decrease in systolic pressure however there is no change in diastolic no pulse rate.  Weight was noted to be slightly increased from the last visit Sam is consistent with some dehydration patient is noted to be on furosemide 20  mg.  Encourage fluids and relative rest at home will recheck on Thursday morning patient is to go to the emergency room if symptoms worsen. - EKG 12-Lead I have reviewed EKG which shows view of EKG notes normal sinus rhythm with bradycardia of 52.  Normal intervals as well as no ischemic changes noted.. Comparison to previous EKG dated 06/16/2017 notes no change.  There is no evidence of atrial fibrillation or flutter or heart block. 2. Chronic systolic CHF (congestive heart failure), NYHA class 3 ([place]) Patient is followed by Dr. Stev[facility] for congestive heart failure for which he is on carvedilol 6.25 twice a day, furosemide 20 mg once a day, losartan 100 mg once a day.  Patient is also taking tamsulosin for benign prostatic hypertrophy.  And appears to be dehydrated and will temporarily stop furosemide and will recheck on Thursday.  3. Bradycardia Patient is noted to be bradycardic and is presently on weight loss 6.25.  We will continue this for the time being.  Will have patient see cardiology next week upon his return. 4.  Dehydration.  Patient has been instructed to drink fluids and to discontinue furosemide at this time. 5.  Cerebrovascular disease patient has a history of a TIA in the past however there is no focal signs of neurologic concerns noted on exam there is no history of any renovascular concern.. Dr. [facility]  04/04/18

## 2018-04-06 ENCOUNTER — Encounter: Payer: Self-pay | Admitting: Family Medicine

## 2018-04-06 ENCOUNTER — Ambulatory Visit: Payer: Medicare HMO | Admitting: Family Medicine

## 2018-04-06 ENCOUNTER — Ambulatory Visit (INDEPENDENT_AMBULATORY_CARE_PROVIDER_SITE_OTHER): Payer: Medicare HMO | Admitting: Family Medicine

## 2018-04-06 VITALS — BP 130/68 | HR 60 | Ht 71.0 in | Wt 200.0 lb

## 2018-04-06 DIAGNOSIS — E860 Dehydration: Secondary | ICD-10-CM | POA: Diagnosis not present

## 2018-04-06 NOTE — Progress Notes (Signed)
Date:  04/06/2018   Name:  Cody Howard   DOB:  07/07/1934   MRN:  [id number]   Chief Complaint: Follow-up (no more episodes)  Congestive Heart Failure  Presents for follow-up visit. Pertinent negatives include no abdominal pain, chest pain, chest pressure, claudication, edema, fatigue, muscle weakness, near-syncope, nocturia, orthopnea, palpitations, paroxysmal nocturnal dyspnea, shortness of breath or unexpected weight change. The symptoms have been stable.    Review of Systems  Constitutional: Negative for chills, fatigue, fever and unexpected weight change.  HENT: Negative for drooling, ear discharge, ear pain and sore throat.   Respiratory: Negative for cough, shortness of breath and wheezing.   Cardiovascular: Negative for chest pain, palpitations, claudication, leg swelling and near-syncope.  Gastrointestinal: Negative for abdominal pain, blood in stool, constipation, diarrhea and nausea.  Endocrine: Negative for polydipsia.  Genitourinary: Negative for dysuria, frequency, hematuria, nocturia and urgency.  Musculoskeletal: Negative for back pain, myalgias, muscle weakness and neck pain.  Skin: Negative for rash.  Allergic/Immunologic: Negative for environmental allergies.  Neurological: Negative for dizziness and headaches.  Hematological: Does not bruise/bleed easily.  Psychiatric/Behavioral: Negative for suicidal ideas. The patient is not nervous/anxious.     Patient Active Problem List   Diagnosis Date Noted  . TIA (transient ischemic attack) 01/25/2018  . Chronic systolic CHF (congestive heart failure), NYHA class 3 ([place]) 11/29/2017  . CKD (chronic kidney disease) stage 3, GFR 30-59 ml/min (HCC) 08/18/2017  . Bradycardia 08/18/2017  . Atrial flutter, paroxysmal ([place]) 07/21/2017  . Functional dyspnea 07/06/2017  . Cardiomyopathy ([place]) 07/06/2017  . Hyperlipidemia, mixed 06/20/2017  . Hyperlipidemia associated with type 2 diabetes mellitus ([place]) 04/06/2016  .  Type 2 diabetes mellitus without complications ([place]) 48/54/6270  . Personal history of other diseases of male genital organs 02/10/2015  . Sebaceous cyst 08/28/2014    No Known Allergies  Past Surgical History:  Procedure Laterality Date  . COLONOSCOPY N/A 02/26/2015   Procedure: COLONOSCOPY;  Surgeon: Hulen Luster, MD;  Location: [place];  Service: Gastroenterology;  Laterality: N/A;  . COLONOSCOPY WITH PROPOFOL N/A 11/02/2017   Procedure: COLONOSCOPY WITH PROPOFOL;  Surgeon: Tol[place], Benay Pike, MD;  Location: [facility];  Service: Gastroenterology;  Laterality: N/A;  . ESOPHAGOGASTRODUODENOSCOPY N/A 02/26/2015   Procedure: ESOPHAGOGASTRODUODENOSCOPY (EGD);  Surgeon: Hulen Luster, MD;  Location: [place];  Service: Gastroenterology;  Laterality: N/A;  Diabetic - oral meds  . ESOPHAGOGASTRODUODENOSCOPY (EGD) WITH PROPOFOL N/A 11/02/2017   Procedure: ESOPHAGOGASTRODUODENOSCOPY (EGD) WITH PROPOFOL;  Surgeon: Toledo, Benay Pike, MD;  Location: [facility];  Service: Gastroenterology;  Laterality: N/A;  . KIDNEY SURGERY Right    surgery when 84 years old  . POLYPECTOMY  02/26/2015   Procedure: POLYPECTOMY;  Surgeon: Hulen Luster, MD;  Location: [place];  Service: Gastroenterology;;  . sebaceous cyst removal Right    located right of spine on upper back    Social History   Tobacco Use  . Smoking status: Former Smoker    Last attempt to quit: 82/03/1978    Years since quitting: 82  . Smokeless tobacco: Never Used  Substance Use Topics  . Alcohol use: Yes    Alcohol/week: 0.0 standard drinks    Comment: Rarely  . Drug use: No     Medication list has been reviewed and updated.  No outpatient medications have been marked as taking for the 04/06/80 encounter (Office Visit) with [name], MD.    [facility] 2/9 Scores 11/29/2016 08/07/2015 03/14/2015 02/10/2015  PHQ - 2 Score 0 0 0 1  PHQ- 9 Score 0 - - -    Physical Exam Vitals signs and nursing note  reviewed.  HENT:     Head: Normocephalic.     Right Ear: External ear normal.     Left Ear: External ear normal.     Nose: Nose normal.  Eyes:     General: No scleral icterus.       Right eye: No discharge.        Left eye: No discharge.     Conjunctiva/sclera: Conjunctivae normal.     Pupils: Pupils are equal, round, and reactive to light.  Neck:     Musculoskeletal: Normal range of motion and neck supple.     Thyroid: No thyromegaly.     Vascular: No JVD.     Trachea: No tracheal deviation.  Cardiovascular:     Rate and Rhythm: Normal rate and regular rhythm.     Heart sounds: Normal heart sounds. No murmur. No friction rub. No gallop.   Pulmonary:     Effort: No respiratory distress.     Breath sounds: Normal breath sounds. No wheezing or rales.  Abdominal:     General: Bowel sounds are normal.     Palpations: Abdomen is soft. There is no mass.     Tenderness: There is no abdominal tenderness. There is no guarding or rebound.  Musculoskeletal: Normal range of motion.        General: No tenderness.  Lymphadenopathy:     Cervical: No cervical adenopathy.  Skin:    General: Skin is warm.     Findings: No rash.  Neurological:     Mental Status: He is alert and oriented to person, place, and time.     Cranial Nerves: No cranial nerve deficit.     Deep Tendon Reflexes: Reflexes are normal and symmetric.     BP 130/68   Pulse 60   Ht 5\' 11"  (1.803 m)   Wt 200 lb (90.7 kg)   BMI 27.89 kg/m   Assessment and Plan: 1. Dehydration Patient returns today for reevaluation after near syncopal episodes due to volume under loading and diuretic use.  Patient will continue to hold furosemide until 2 weeks and then will go 3 days a week on 20 mg furosemide.  In the meantime fluid intake was encouraged it was noted that patient had a 3 pound weight gain and was most likely due to rehydration.

## 2018-04-12 DIAGNOSIS — I4892 Unspecified atrial flutter: Secondary | ICD-10-CM | POA: Diagnosis not present

## 2018-04-12 DIAGNOSIS — R001 Bradycardia, unspecified: Secondary | ICD-10-CM | POA: Diagnosis not present

## 2018-04-12 DIAGNOSIS — N183 Chronic kidney disease, stage 3 (moderate): Secondary | ICD-10-CM | POA: Diagnosis not present

## 2018-04-12 DIAGNOSIS — R42 Dizziness and giddiness: Secondary | ICD-10-CM | POA: Diagnosis not present

## 2018-04-17 DIAGNOSIS — Z87891 Personal history of nicotine dependence: Secondary | ICD-10-CM | POA: Diagnosis not present

## 2018-04-17 DIAGNOSIS — E785 Hyperlipidemia, unspecified: Secondary | ICD-10-CM | POA: Diagnosis not present

## 2018-04-17 DIAGNOSIS — Z7984 Long term (current) use of oral hypoglycemic drugs: Secondary | ICD-10-CM | POA: Diagnosis not present

## 2018-04-17 DIAGNOSIS — R32 Unspecified urinary incontinence: Secondary | ICD-10-CM | POA: Diagnosis not present

## 2018-04-17 DIAGNOSIS — E119 Type 2 diabetes mellitus without complications: Secondary | ICD-10-CM | POA: Diagnosis not present

## 2018-04-17 DIAGNOSIS — I10 Essential (primary) hypertension: Secondary | ICD-10-CM | POA: Diagnosis not present

## 2018-04-17 DIAGNOSIS — Z8249 Family history of ischemic heart disease and other diseases of the circulatory system: Secondary | ICD-10-CM | POA: Diagnosis not present

## 2018-04-17 DIAGNOSIS — J309 Allergic rhinitis, unspecified: Secondary | ICD-10-CM | POA: Diagnosis not present

## 2018-04-17 DIAGNOSIS — N529 Male erectile dysfunction, unspecified: Secondary | ICD-10-CM | POA: Diagnosis not present

## 2018-04-17 DIAGNOSIS — N400 Enlarged prostate without lower urinary tract symptoms: Secondary | ICD-10-CM | POA: Diagnosis not present

## 2018-04-19 DIAGNOSIS — R69 Illness, unspecified: Secondary | ICD-10-CM | POA: Diagnosis not present

## 2018-04-25 ENCOUNTER — Other Ambulatory Visit: Payer: Self-pay | Admitting: Family Medicine

## 2018-04-25 DIAGNOSIS — R69 Illness, unspecified: Secondary | ICD-10-CM | POA: Diagnosis not present

## 2018-04-25 DIAGNOSIS — E119 Type 2 diabetes mellitus without complications: Secondary | ICD-10-CM

## 2018-04-25 MED ORDER — GLUCOSE BLOOD VI STRP: ORAL_STRIP | 0 refills | Status: DC

## 2018-04-25 NOTE — Progress Notes (Unsigned)
Sent in test strips 

## 2018-05-01 ENCOUNTER — Other Ambulatory Visit: Payer: Self-pay | Admitting: Family Medicine

## 2018-05-01 DIAGNOSIS — E119 Type 2 diabetes mellitus without complications: Secondary | ICD-10-CM

## 2018-05-03 ENCOUNTER — Other Ambulatory Visit: Payer: Self-pay | Admitting: Family Medicine

## 2018-05-03 DIAGNOSIS — L218 Other seborrheic dermatitis: Secondary | ICD-10-CM | POA: Diagnosis not present

## 2018-05-03 DIAGNOSIS — N183 Chronic kidney disease, stage 3 unspecified: Secondary | ICD-10-CM

## 2018-05-03 DIAGNOSIS — L814 Other melanin hyperpigmentation: Secondary | ICD-10-CM | POA: Diagnosis not present

## 2018-05-03 DIAGNOSIS — L821 Other seborrheic keratosis: Secondary | ICD-10-CM | POA: Diagnosis not present

## 2018-05-03 DIAGNOSIS — Z85828 Personal history of other malignant neoplasm of skin: Secondary | ICD-10-CM | POA: Diagnosis not present

## 2018-05-03 DIAGNOSIS — I429 Cardiomyopathy, unspecified: Secondary | ICD-10-CM

## 2018-05-03 DIAGNOSIS — X32XXXA Exposure to sunlight, initial encounter: Secondary | ICD-10-CM | POA: Diagnosis not present

## 2018-05-03 DIAGNOSIS — L570 Actinic keratosis: Secondary | ICD-10-CM | POA: Diagnosis not present

## 2018-05-03 DIAGNOSIS — Z08 Encounter for follow-up examination after completed treatment for malignant neoplasm: Secondary | ICD-10-CM | POA: Diagnosis not present

## 2018-05-03 DIAGNOSIS — D2372 Other benign neoplasm of skin of left lower limb, including hip: Secondary | ICD-10-CM | POA: Diagnosis not present

## 2018-05-09 ENCOUNTER — Other Ambulatory Visit: Payer: Self-pay

## 2018-05-09 ENCOUNTER — Encounter: Payer: Self-pay | Admitting: Family Medicine

## 2018-05-09 ENCOUNTER — Ambulatory Visit (INDEPENDENT_AMBULATORY_CARE_PROVIDER_SITE_OTHER): Payer: Medicare HMO | Admitting: Family Medicine

## 2018-05-09 VITALS — BP 108/68 | HR 85 | Resp 16 | Ht 73.0 in | Wt 198.8 lb

## 2018-05-09 DIAGNOSIS — Z23 Encounter for immunization: Secondary | ICD-10-CM

## 2018-05-09 DIAGNOSIS — R4189 Other symptoms and signs involving cognitive functions and awareness: Secondary | ICD-10-CM

## 2018-05-09 DIAGNOSIS — E785 Hyperlipidemia, unspecified: Secondary | ICD-10-CM | POA: Diagnosis not present

## 2018-05-09 DIAGNOSIS — E1169 Type 2 diabetes mellitus with other specified complication: Secondary | ICD-10-CM | POA: Diagnosis not present

## 2018-05-09 DIAGNOSIS — I429 Cardiomyopathy, unspecified: Secondary | ICD-10-CM | POA: Diagnosis not present

## 2018-05-09 DIAGNOSIS — E119 Type 2 diabetes mellitus without complications: Secondary | ICD-10-CM

## 2018-05-09 NOTE — Progress Notes (Signed)
Date:  05/09/2018   Name:  Cody Howard   DOB:  1934/04/07   MRN:  [id number]   Chief Complaint: Diabetes (BS 120-140) and Hypertension  Diabetes  He presents for his follow-up diabetic visit. He has type 2 diabetes mellitus. His disease course has been stable. There are no hypoglycemic associated symptoms. Pertinent negatives for hypoglycemia include no dizziness, headaches or nervousness/anxiousness. There are no diabetic associated symptoms. Pertinent negatives for diabetes include no blurred vision, no chest pain and no polydipsia. There are no hypoglycemic complications. Symptoms are stable. There are no diabetic complications. Pertinent negatives for diabetic complications include no CVA, PVD or retinopathy. There are no known risk factors for coronary artery disease. Current diabetic treatment includes oral agent (monotherapy). He is compliant with treatment all of the time. His weight is stable. He is following a generally healthy diet. He participates in exercise daily. His breakfast blood glucose is taken between 8-9 am. His breakfast blood glucose range is generally 130-140 mg/dl. An ACE inhibitor/angiotensin II receptor blocker is being taken. Eye exam is not current.  Hypertension  The current episode started in the past 7 days. The problem has been gradually improving since onset. The problem is controlled. Pertinent negatives include no anxiety, blurred vision, chest pain, headaches, malaise/fatigue, neck pain, orthopnea, palpitations, peripheral edema, PND or shortness of breath. Risk factors for coronary artery disease include male gender and dyslipidemia. Past treatments include angiotensin blockers and beta blockers (recently stopped furosemide). The current treatment provides moderate improvement. There are no compliance problems.  There is no history of angina, kidney disease, CAD/MI, CVA, heart failure, left ventricular hypertrophy, PVD or retinopathy. There is no history of  chronic renal disease, a hypertension causing med or renovascular disease.    Review of Systems  Constitutional: Negative for chills, fever and malaise/fatigue.  HENT: Negative for drooling, ear discharge, ear pain and sore throat.   Eyes: Negative for blurred vision.  Respiratory: Negative for cough, shortness of breath and wheezing.   Cardiovascular: Negative for chest pain, palpitations, orthopnea, leg swelling and PND.  Gastrointestinal: Negative for abdominal pain, blood in stool, constipation, diarrhea and nausea.  Endocrine: Negative for polydipsia.  Genitourinary: Negative for dysuria, frequency, hematuria and urgency.  Musculoskeletal: Negative for back pain, myalgias and neck pain.  Skin: Negative for rash.  Allergic/Immunologic: Negative for environmental allergies.  Neurological: Negative for dizziness and headaches.  Hematological: Does not bruise/bleed easily.  Psychiatric/Behavioral: Negative for suicidal ideas. The patient is not nervous/anxious.     Patient Active Problem List   Diagnosis Date Noted  . Dizziness 04/12/2018  . TIA (transient ischemic attack) 01/25/2018  . Chronic systolic CHF (congestive heart failure), NYHA class 3 ([place]) 11/29/2017  . CKD (chronic kidney disease) stage 3, GFR 30-59 ml/min (HCC) 08/18/2017  . Bradycardia 08/18/2017  . Atrial flutter, paroxysmal ([place]) 07/21/2017  . Functional dyspnea 07/06/2017  . Cardiomyopathy ([place]) 07/06/2017  . Hyperlipidemia, mixed 06/20/2017  . Hyperlipidemia associated with type 2 diabetes mellitus ([place]) 04/06/2016  . Type 2 diabetes mellitus without complications ([place]) 82/05/7541  . Personal history of other diseases of male genital organs 02/10/2015  . Sebaceous cyst 08/28/2014    No Known Allergies  Past Surgical History:  Procedure Laterality Date  . COLONOSCOPY N/A 02/26/2015   Procedure: COLONOSCOPY;  Surgeon: Hulen Luster, MD;  Location: [place];  Service: Gastroenterology;  Laterality:  N/A;  . COLONOSCOPY WITH PROPOFOL N/A 11/02/2017   Procedure: COLONOSCOPY WITH PROPOFOL;  Surgeon: [place],  Benay Pike, MD;  Location: [facility];  Service: Gastroenterology;  Laterality: N/A;  . ESOPHAGOGASTRODUODENOSCOPY N/A 02/26/2015   Procedure: ESOPHAGOGASTRODUODENOSCOPY (EGD);  Surgeon: Hulen Luster, MD;  Location: [place];  Service: Gastroenterology;  Laterality: N/A;  Diabetic - oral meds  . ESOPHAGOGASTRODUODENOSCOPY (EGD) WITH PROPOFOL N/A 11/02/2017   Procedure: ESOPHAGOGASTRODUODENOSCOPY (EGD) WITH PROPOFOL;  Surgeon: Toledo, Benay Pike, MD;  Location: [facility];  Service: Gastroenterology;  Laterality: N/A;  . KIDNEY SURGERY Right    surgery when 84 years old  . POLYPECTOMY  02/26/2015   Procedure: POLYPECTOMY;  Surgeon: Hulen Luster, MD;  Location: [place];  Service: Gastroenterology;;  . sebaceous cyst removal Right    located right of spine on upper back    Social History   Tobacco Use  . Smoking status: Former Smoker    Last attempt to quit: 03/08/1978    Years since quitting: 40.1  . Smokeless tobacco: Never Used  Substance Use Topics  . Alcohol use: Yes    Alcohol/week: 0.0 standard drinks    Comment: Rarely  . Drug use: No     Medication list has been reviewed and updated.  Current Meds  Medication Sig  . ACCU-CHEK SOFTCLIX LANCETS lancets TEST BLOOD SUGAR EVERY DAY  . Blood Glucose Monitoring Suppl (ACCU-CHEK AVIVA PLUS) w/Device KIT USE AS DIRECTED  . carvedilol (COREG) 6.25 MG tablet Take 1 tablet (6.25 mg total) by mouth 2 (two) times daily with a meal.  . furosemide (LASIX) 20 MG tablet Take 1 tablet (20 mg total) by mouth daily.  Marland Kitchen glucose blood test strip One Touch Ultra Blue USE TO TEST ONCE A DAY  . losartan (COZAAR) 100 MG tablet TAKE 1/2 TABLET BY MOUTH EVERY DAY  . metFORMIN (GLUCOPHAGE) 1000 MG tablet TAKE 1 TABLET BY MOUTH TWICE A DAY  . montelukast (SINGULAIR) 10 MG tablet TAKE 1 TABLET BY MOUTH EVERYDAY AT BEDTIME  .  Omega-3 Fatty Acids (FISH OIL) 1000 MG CAPS Take 1 capsule (1,000 mg total) by mouth daily.  . simvastatin (ZOCOR) 80 MG tablet Take 1 tablet (80 mg total) by mouth every evening.  . tamsulosin (FLOMAX) 0.4 MG CAPS capsule Take 1 capsule (0.4 mg total) by mouth daily.    PHQ 2/9 Scores 05/09/2018 11/29/2016 08/07/2015 03/14/2015  PHQ - 2 Score 0 0 0 0  PHQ- 9 Score 0 0 - -    Physical Exam Vitals signs and nursing note reviewed.  HENT:     Head: Normocephalic.     Right Ear: Tympanic membrane, ear canal and external ear normal.     Left Ear: Tympanic membrane, ear canal and external ear normal.     Nose: Nose normal. No congestion.     Mouth/Throat:     Mouth: Mucous membranes are moist.     Pharynx: No oropharyngeal exudate or posterior oropharyngeal erythema.  Eyes:     General: No scleral icterus.       Right eye: No discharge.        Left eye: No discharge.     Conjunctiva/sclera: Conjunctivae normal.     Pupils: Pupils are equal, round, and reactive to light.  Neck:     Musculoskeletal: Normal range of motion and neck supple.     Thyroid: No thyromegaly.     Vascular: No JVD.     Trachea: No tracheal deviation.  Cardiovascular:     Rate and Rhythm: Normal rate and regular rhythm.     Heart sounds:  Normal heart sounds. No murmur. No friction rub. No gallop.   Pulmonary:     Effort: No respiratory distress.     Breath sounds: Normal breath sounds. No wheezing or rales.  Abdominal:     General: Bowel sounds are normal.     Palpations: Abdomen is soft. There is no mass.     Tenderness: There is no abdominal tenderness. There is no right CVA tenderness, left CVA tenderness, guarding or rebound.  Musculoskeletal: Normal range of motion.        General: No tenderness.  Lymphadenopathy:     Cervical: No cervical adenopathy.  Skin:    General: Skin is warm.     Findings: No rash.  Neurological:     Mental Status: He is alert and oriented to person, place, and time.     Cranial  Nerves: No cranial nerve deficit.     Deep Tendon Reflexes: Reflexes are normal and symmetric.     BP 108/68   Pulse 85   Resp 16   Ht '6\' 1"'  (1.854 m)   Wt 198 lb 12.8 oz (90.2 kg)   SpO2 97%   BMI 26.23 kg/m   Assessment and Plan: 1. Type 2 diabetes mellitus without complication, without long-term current use of insulin (HCC) Chronic.  Controlled.  Continue metformin 1 g twice a day.  2. Hyperlipidemia associated with type 2 diabetes mellitus ([place]) Care of hyperlipidemia patient will continue simvastatin 80 mg daily.  3. Cardiomyopathy, unspecified type [facility]) Patient has a cardiomyopathy for which he is followed by cardiology.  Intake continue carvedilol 6.25.  Patient will hold his Lasix 20 mg for the time being as well as continue his losartan 100 mg daily.  4. Cognitive changes Since leaving the room patient spouse brought up the possibility of getting a neurology consult for apparently cognitive changes that is been noted by the son son would like a neurologist to see him an appointment was made for neurology to evaluate for possible cognitive concerns. - Ambulatory referral to Neurology  5. Need for pneumococcal vaccination Discussed and administered. - Pneumococcal conjugate vaccine 13-valent

## 2018-05-10 ENCOUNTER — Ambulatory Visit (INDEPENDENT_AMBULATORY_CARE_PROVIDER_SITE_OTHER): Payer: Medicare HMO

## 2018-05-10 VITALS — BP 130/68 | HR 76 | Resp 16 | Ht 71.0 in | Wt 199.2 lb

## 2018-05-10 DIAGNOSIS — Z0000 Encounter for general adult medical examination without abnormal findings: Secondary | ICD-10-CM

## 2018-05-10 NOTE — Patient Instructions (Signed)
Cody Howard , Thank you for taking time to come for your Medicare Wellness Visit. I appreciate your ongoing commitment to your health goals. Please review the following plan we discussed and let me know if I can assist you in the future.   Screening recommendations/referrals: Colonoscopy: done 11/02/17. No longer required.  Recommended yearly ophthalmology/optometry visit for glaucoma screening and checkup Recommended yearly dental visit for hygiene and checkup  Vaccinations: Influenza vaccine: done 12/20/17 Pneumococcal vaccine: done 05/09/18 Tdap vaccine: done 11/29/14 Shingles vaccine: Shingrix discussed. Please contact your pharmacy for coverage information.   Advanced directives: Please bring a copy of your health care power of attorney and living will to the office at your convenience.  Conditions/risks identified: Recommend increasing physical activity.  Next appointment: Please follow up in one year for your Medicare Annual Wellness visit.   Preventive Care 56 Years and Older, Male Preventive care refers to lifestyle choices and visits with your health care provider that can promote health and wellness. What does preventive care include?  A yearly physical exam. This is also called an annual well check.  Dental exams once or twice a year.  Routine eye exams. Ask your health care provider how often you should have your eyes checked.  Personal lifestyle choices, including:  Daily care of your teeth and gums.  Regular physical activity.  Eating a healthy diet.  Avoiding tobacco and drug use.  Limiting alcohol use.  Practicing safe sex.  Taking low doses of aspirin every day.  Taking vitamin and mineral supplements as recommended by your health care provider. What happens during an annual well check? The services and screenings done by your health care provider during your annual well check will depend on your age, overall health, lifestyle risk factors, and family  history of disease. Counseling  Your health care provider may ask you questions about your:  Alcohol use.  Tobacco use.  Drug use.  Emotional well-being.  Home and relationship well-being.  Sexual activity.  Eating habits.  History of falls.  Memory and ability to understand (cognition).  Work and work Statistician. Screening  You may have the following tests or measurements:  Height, weight, and BMI.  Blood pressure.  Lipid and cholesterol levels. These may be checked every 5 years, or more frequently if you are over 11 years old.  Skin check.  Lung cancer screening. You may have this screening every year starting at age 80 if you have a 30-pack-year history of smoking and currently smoke or have quit within the past 15 years.  Fecal occult blood test (FOBT) of the stool. You may have this test every year starting at age 92.  Flexible sigmoidoscopy or colonoscopy. You may have a sigmoidoscopy every 5 years or a colonoscopy every 10 years starting at age 84.  Prostate cancer screening. Recommendations will vary depending on your family history and other risks.  Hepatitis C blood test.  Hepatitis B blood test.  Sexually transmitted disease (STD) testing.  Diabetes screening. This is done by checking your blood sugar (glucose) after you have not eaten for a while (fasting). You may have this done every 1-3 years.  Abdominal aortic aneurysm (AAA) screening. You may need this if you are a current or former smoker.  Osteoporosis. You may be screened starting at age 27 if you are at high risk. Talk with your health care provider about your test results, treatment options, and if necessary, the need for more tests. Vaccines  Your health care provider may  recommend certain vaccines, such as:  Influenza vaccine. This is recommended every year.  Tetanus, diphtheria, and acellular pertussis (Tdap, Td) vaccine. You may need a Td booster every 10 years.  Zoster vaccine.  You may need this after age 25.  Pneumococcal 13-valent conjugate (PCV13) vaccine. One dose is recommended after age 17.  Pneumococcal polysaccharide (PPSV23) vaccine. One dose is recommended after age 63. Talk to your health care provider about which screenings and vaccines you need and how often you need them. This information is not intended to replace advice given to you by your health care provider. Make sure you discuss any questions you have with your health care provider. Document Released: 03/21/2015 Document Revised: 11/12/2015 Document Reviewed: 12/24/2014 Elsevier Interactive Patient Education  2017 [place] Prevention in the Home Falls can cause injuries. They can happen to people of all ages. There are many things you can do to make your home safe and to help prevent falls. What can I do on the outside of my home?  Regularly fix the edges of walkways and driveways and fix any cracks.  Remove anything that might make you trip as you walk through a door, such as a raised step or threshold.  Trim any bushes or trees on the path to your home.  Use bright outdoor lighting.  Clear any walking paths of anything that might make someone trip, such as rocks or tools.  Regularly check to see if handrails are loose or broken. Make sure that both sides of any steps have handrails.  Any raised decks and porches should have guardrails on the edges.  Have any leaves, snow, or ice cleared regularly.  Use sand or salt on walking paths during winter.  Clean up any spills in your garage right away. This includes oil or grease spills. What can I do in the bathroom?  Use night lights.  Install grab bars by the toilet and in the tub and shower. Do not use towel bars as grab bars.  Use non-skid mats or decals in the tub or shower.  If you need to sit down in the shower, use a plastic, non-slip stool.  Keep the floor dry. Clean up any water that spills on the floor as soon  as it happens.  Remove soap buildup in the tub or shower regularly.  Attach bath mats securely with double-sided non-slip rug tape.  Do not have throw rugs and other things on the floor that can make you trip. What can I do in the bedroom?  Use night lights.  Make sure that you have a light by your bed that is easy to reach.  Do not use any sheets or blankets that are too big for your bed. They should not hang down onto the floor.  Have a firm chair that has side arms. You can use this for support while you get dressed.  Do not have throw rugs and other things on the floor that can make you trip. What can I do in the kitchen?  Clean up any spills right away.  Avoid walking on wet floors.  Keep items that you use a lot in easy-to-reach places.  If you need to reach something above you, use a strong step stool that has a grab bar.  Keep electrical cords out of the way.  Do not use floor polish or wax that makes floors slippery. If you must use wax, use non-skid floor wax.  Do not have throw rugs and  other things on the floor that can make you trip. What can I do with my stairs?  Do not leave any items on the stairs.  Make sure that there are handrails on both sides of the stairs and use them. Fix handrails that are broken or loose. Make sure that handrails are as long as the stairways.  Check any carpeting to make sure that it is firmly attached to the stairs. Fix any carpet that is loose or worn.  Avoid having throw rugs at the top or bottom of the stairs. If you do have throw rugs, attach them to the floor with carpet tape.  Make sure that you have a light switch at the top of the stairs and the bottom of the stairs. If you do not have them, ask someone to add them for you. What else can I do to help prevent falls?  Wear shoes that:  Do not have high heels.  Have rubber bottoms.  Are comfortable and fit you well.  Are closed at the toe. Do not wear sandals.  If  you use a stepladder:  Make sure that it is fully opened. Do not climb a closed stepladder.  Make sure that both sides of the stepladder are locked into place.  Ask someone to hold it for you, if possible.  Clearly mark and make sure that you can see:  Any grab bars or handrails.  First and last steps.  Where the edge of each step is.  Use tools that help you move around (mobility aids) if they are needed. These include:  Canes.  Walkers.  Scooters.  Crutches.  Turn on the lights when you go into a dark area. Replace any light bulbs as soon as they burn out.  Set up your furniture so you have a clear path. Avoid moving your furniture around.  If any of your floors are uneven, fix them.  If there are any pets around you, be aware of where they are.  Review your medicines with your doctor. Some medicines can make you feel dizzy. This can increase your chance of falling. Ask your doctor what other things that you can do to help prevent falls. This information is not intended to replace advice given to you by your health care provider. Make sure you discuss any questions you have with your health care provider. Document Released: 12/19/2008 Document Revised: 07/31/2015 Document Reviewed: 03/29/2014 Elsevier Interactive Patient Education  2017 [employer].

## 2018-05-10 NOTE — Progress Notes (Signed)
Subjective:   Cody Howard is a 84 y.o. male who presents for Medicare Annual/Subsequent preventive examination.  Review of Systems:   Cardiac Risk Factors include: advanced age (>49mn, >>85women);male gender;dyslipidemia;hypertension     Objective:    Vitals: BP 130/68 (BP Location: Left Arm, Patient Position: Sitting, Cuff Size: Normal)   Pulse 76   Resp 16   Ht 5' 11" (1.803 m)   Wt 199 lb 3.2 oz (90.4 kg)   SpO2 96%   BMI 27.78 kg/m   Body mass index is 27.78 kg/m.  Advanced Directives 05/10/2018 11/02/2017 06/14/2017 02/26/2015 02/10/2015  Does Patient Have a Medical Advance Directive? Yes Yes No Yes Yes  Type of Advance Directive Living will;Healthcare [place] of [place] - [place]  Does patient want to make changes to medical advance directive? - - - No - Patient declined -  Copy of [place]? No - copy requested No - copy requested - - No - copy requested  Would patient like information on creating a medical advance directive? - No - Patient declined - - -    Tobacco Social History   Tobacco Use  Smoking Status Former Smoker  . Last attempt to quit: 03/08/1978  . Years since quitting: 40.2  Smokeless Tobacco Never Used     Counseling given: Not Answered   Clinical Intake:  Pre-visit preparation completed: Yes  Pain : No/denies pain     BMI - recorded: 27.78 Nutritional Status: BMI 25 -29 Overweight Nutritional Risks: None Diabetes: Yes CBG done?: No Did pt. bring in CBG monitor from home?: No   Nutrition Risk Assessment:  Has the patient had any N/V/D within the last 2 months?  No  Does the patient have any non-healing wounds?  No  Has the patient had any unintentional weight loss or weight gain?  No   Diabetes:  Is the patient diabetic?  Yes  If diabetic, was a CBG obtained today?  No  Did the patient bring in their glucometer from  home?  No  How often do you monitor your CBG's? Daily fasting in AM.   Financial Strains and Diabetes Management:  Are you having any financial strains with the device, your supplies or your medication? No .  Does the patient want to be seen by Chronic Care Management for management of their diabetes?  No  Would the patient like to be referred to a Nutritionist or for Diabetic Management?  No   Diabetic Exams:  Diabetic Eye Exam: Overdue for diabetic eye exam. Pt has been advised about the importance in completing this exam.   Diabetic Foot Exam: Completed 04/04/18.   How often do you need to have someone help you when you read instructions, pamphlets, or other written materials from your doctor or pharmacy?: 1 - Never  Interpreter Needed?: No  Information entered by :: KClemetine MarkerLPN  Past Medical History:  Diagnosis Date  . AF [place]atrial fibrillation) [place]   . Arthritis    hands  . Chronic kidney disease   . Diabetes mellitus without complication [place]   . Hyperlipidemia   . Hypertension   . Prostate enlargement    Past Surgical History:  Procedure Laterality Date  . COLONOSCOPY N/A 02/26/2015   Procedure: COLONOSCOPY;  Surgeon: PHulen Luster MD;  [place] [facility]  Service: Gastroenterology;  Laterality: N/A;  . COLONOSCOPY WITH PROPOFOL N/A 11/02/2017  Procedure: COLONOSCOPY WITH PROPOFOL;  Surgeon: Toledo, Benay Pike, MD;  Location: [facility];  Service: Gastroenterology;  Laterality: N/A;  . ESOPHAGOGASTRODUODENOSCOPY N/A 02/26/2015   Procedure: ESOPHAGOGASTRODUODENOSCOPY (EGD);  Surgeon: Hulen Luster, MD;  Location: [place];  Service: Gastroenterology;  Laterality: N/A;  Diabetic - oral meds  . ESOPHAGOGASTRODUODENOSCOPY (EGD) WITH PROPOFOL N/A 11/02/2017   Procedure: ESOPHAGOGASTRODUODENOSCOPY (EGD) WITH PROPOFOL;  Surgeon: Toledo, Benay Pike, MD;  Location: [facility];  Service: Gastroenterology;  Laterality: N/A;  . KIDNEY SURGERY Right     surgery when 84 years old  . POLYPECTOMY  02/26/2015   Procedure: POLYPECTOMY;  Surgeon: Hulen Luster, MD;  Location: [place];  Service: Gastroenterology;;  . sebaceous cyst removal Right    located right of spine on upper back   Family History  Problem Relation Age of Onset  . Diabetes Father   . Heart disease Father    Social History   Socioeconomic History  . Marital status: Married    Spouse name: Not on file  . Number of children: 2  . Years of education: Not on file  . Highest education level: Associate degree: occupational, Hotel manager, or vocational program  Occupational History  . Occupation: retired  Scientific laboratory technician  . Financial resource strain: Not hard at all  . Food insecurity:    Worry: Never true    Inability: Never true  . Transportation needs:    Medical: No    Non-medical: No  Tobacco Use  . Smoking status: Former Smoker    Last attempt to quit: 03/08/1978    Years since quitting: 40.2  . Smokeless tobacco: Never Used  Substance and Sexual Activity  . Alcohol use: Yes    Alcohol/week: 0.0 standard drinks    Comment: Rarely  . Drug use: No  . Sexual activity: Not Currently  Lifestyle  . Physical activity:    Days per week: 0 days    Minutes per session: 0 min  . Stress: Only a little  Relationships  . Social connections:    Talks on phone: More than three times a week    Gets together: Three times a week    Attends religious service: 1 to 4 times per year    Active member of club or organization: No    Attends meetings of clubs or organizations: Never    Relationship status: Married  Other Topics Concern  . Not on file  Social History Narrative  . Not on file    Outpatient Encounter Medications as of 05/10/2018  Medication Sig  . ACCU-CHEK SOFTCLIX LANCETS lancets TEST BLOOD SUGAR EVERY DAY  . Blood Glucose Monitoring Suppl (ACCU-CHEK AVIVA PLUS) w/Device KIT USE AS DIRECTED  . diphenhydrAMINE (BENADRYL) 50 MG capsule Take 50 mg by mouth.  qhs  . glucose blood test strip One Touch Ultra Blue USE TO TEST ONCE A DAY  . losartan (COZAAR) 100 MG tablet TAKE 1/2 TABLET BY MOUTH EVERY DAY  . Melatonin 5 MG TABS Take 1 tablet by mouth at bedtime.  . metFORMIN (GLUCOPHAGE) 1000 MG tablet TAKE 1 TABLET BY MOUTH TWICE A DAY  . Omega-3 Fatty Acids (FISH OIL) 1000 MG CAPS Take 1 capsule (1,000 mg total) by mouth daily.  . simvastatin (ZOCOR) 80 MG tablet Take 1 tablet (80 mg total) by mouth every evening.  . tamsulosin (FLOMAX) 0.4 MG CAPS capsule Take 1 capsule (0.4 mg total) by mouth daily.  . carvedilol (COREG) 6.25 MG tablet Take 1 tablet (6.25  mg total) by mouth 2 (two) times daily with a meal. (Patient not taking: Reported on 05/10/2018)  . furosemide (LASIX) 20 MG tablet Take 1 tablet (20 mg total) by mouth daily. (Patient not taking: Reported on 05/10/2018)  . montelukast (SINGULAIR) 10 MG tablet TAKE 1 TABLET BY MOUTH EVERYDAY AT BEDTIME (Patient not taking: Reported on 05/10/2018)   No facility-administered encounter medications on file as of 05/10/2018.     Activities of Daily Living In your present state of health, do you have any difficulty performing the following activities: 05/10/2018  Hearing? N  Comment declines hearing aids  Vision? N  Comment wears glasses  Difficulty concentrating or making decisions? N  Walking or climbing stairs? N  Dressing or bathing? N  Doing errands, shopping? N  Preparing Food and eating ? N  Using the Toilet? N  In the past six months, have you accidently leaked urine? N  Do you have problems with loss of bowel control? N  Managing your Medications? N  Managing your Finances? N  Housekeeping or managing your Housekeeping? N  Some recent data might be hidden    Patient Care Team: [name], MD as PCP - General (Family Medicine)   Assessment:   This is a routine wellness examination for [place].  Exercise Activities and Dietary recommendations Current Exercise Habits: The patient does  not participate in regular exercise at present, Exercise limited by: cardiac condition(s)  Goals    . Patient Stated     Patient states he would like to remain healthy.       Fall Risk Fall Risk  05/09/2018 11/29/2016 08/07/2015 03/14/2015 02/10/2015  Falls in the past year? 0 No Yes No No  Number falls in past yr: 0 - 1 - -  Injury with Fall? 0 - No - -  Follow up - - Falls evaluation completed - -   FALL RISK PREVENTION PERTAINING TO THE HOME:  Any stairs in or around the home? No  If so, do they handrails? No   Home free of loose throw rugs in walkways, pet beds, electrical cords, etc? Yes  Adequate lighting in your home to reduce risk of falls? Yes   ASSISTIVE DEVICES UTILIZED TO PREVENT FALLS:  Life alert? No  Use of a cane, walker or w/c? No  Grab bars in the bathroom? No  Shower chair or bench in shower? Yes  Elevated toilet seat or a handicapped toilet? Yes   DME ORDERS:  DME order needed?  No   TIMED UP AND GO:  Was the test performed? Yes .  Length of time to ambulate 10 feet: 6 sec.   GAIT:  Appearance of gait: Gait stead-fast and without the use of an assistive device.    Education: Fall risk prevention has been discussed.  Intervention(s) required? No    Depression Screen PHQ 2/9 Scores 05/09/2018 11/29/2016 08/07/2015 03/14/2015  PHQ - 2 Score 0 0 0 0  PHQ- 9 Score 0 0 - -    Cognitive Function     6CIT Screen 05/10/2018  What Year? 0 points  What month? 0 points  What time? 0 points  Count back from 20 0 points  Months in reverse 4 points  Repeat phrase 4 points  Total Score 8    Immunization History  Administered Date(s) Administered  . Influenza,inj,quad, With Preservative 12/17/2017  . Pneumococcal Conjugate-13 05/09/2018  . Pneumococcal Polysaccharide-23 07/31/2010  . Tdap 11/29/2014    Qualifies for Shingles Vaccine? Yes  .  Due for Shingrix. Education has been provided regarding the importance of this vaccine. Pt has been advised to call  insurance company to determine out of pocket expense. Advised may also receive vaccine at local pharmacy or Health Dept. Verbalized acceptance and understanding.  Tdap: Up to date  Flu Vaccine: Up to date  Pneumococcal Vaccine: Up to date   Screening Tests Health Maintenance  Topic Date Due  . OPHTHALMOLOGY EXAM  05/22/2016  . HEMOGLOBIN A1C  08/08/2018  . FOOT EXAM  04/05/2019  . TETANUS/TDAP  11/28/2024  . INFLUENZA VACCINE  Completed  . PNA vac Low Risk Adult  Completed   Cancer Screenings:  Colorectal Screening: Completed 11/02/17.  No longer required.   Lung Cancer Screening: (Low Dose CT Chest recommended if Age 24-80 years, 30 pack-year currently smoking OR have quit w/in 15years.) does not qualify.   Additional Screening:  Hepatitis C Screening: no longer required.   Vision Screening: Recommended annual ophthalmology exams for early detection of glaucoma and other disorders of the eye. Is the patient up to date with their annual eye exam?  No  Who is the provider or what is the name of the office in which the pt attends annual eye exams? [place] Screening: Recommended annual dental exams for proper oral hygiene  Community Resource Referral:  CRR required this visit?  No        Plan:    I have personally reviewed and addressed the Medicare Annual Wellness questionnaire and have noted the following in the patient's chart:  A. Medical and social history B. Use of alcohol, tobacco or illicit drugs  C. Current medications and supplements D. Functional ability and status E.  Nutritional status F.  Physical activity G. Advance directives H. List of other physicians I.  Hospitalizations, surgeries, and ER visits in previous 12 months J.  [place] such as hearing and vision if needed, cognitive and depression L. Referrals and appointments   In addition, I have reviewed and discussed with patient certain preventive protocols,  quality metrics, and best practice recommendations. A written personalized care plan for preventive services as well as general preventive health recommendations were provided to patient.   Signed,  Clemetine Marker, LPN Nurse Health Advisor   Nurse Notes:Pt seen in office yesterday for DM routine follow up. Patient brought his meds with him today and he has not been taking carvedilol. Per Dr. [name] patient to keep taking for carvedilol and hold furosemide at this time. This was also confirmed from last note with cardiology.

## 2018-05-11 ENCOUNTER — Other Ambulatory Visit: Payer: Self-pay

## 2018-05-11 MED ORDER — CARVEDILOL 3.125 MG PO TABS: 3.1250 mg | ORAL_TABLET | Freq: Two times a day (BID) | ORAL | 3 refills | Status: DC

## 2018-05-25 DIAGNOSIS — R413 Other amnesia: Secondary | ICD-10-CM | POA: Diagnosis not present

## 2018-07-09 ENCOUNTER — Other Ambulatory Visit: Payer: Self-pay | Admitting: Family Medicine

## 2018-07-09 DIAGNOSIS — E785 Hyperlipidemia, unspecified: Secondary | ICD-10-CM

## 2018-07-09 DIAGNOSIS — E1169 Type 2 diabetes mellitus with other specified complication: Secondary | ICD-10-CM

## 2018-07-09 DIAGNOSIS — I429 Cardiomyopathy, unspecified: Secondary | ICD-10-CM

## 2018-07-21 DIAGNOSIS — R69 Illness, unspecified: Secondary | ICD-10-CM | POA: Diagnosis not present

## 2018-07-30 ENCOUNTER — Other Ambulatory Visit: Payer: Self-pay | Admitting: Family Medicine

## 2018-07-30 DIAGNOSIS — N401 Enlarged prostate with lower urinary tract symptoms: Secondary | ICD-10-CM

## 2018-08-03 ENCOUNTER — Other Ambulatory Visit: Payer: Self-pay | Admitting: Family Medicine

## 2018-08-03 DIAGNOSIS — E119 Type 2 diabetes mellitus without complications: Secondary | ICD-10-CM

## 2018-08-17 DIAGNOSIS — E782 Mixed hyperlipidemia: Secondary | ICD-10-CM | POA: Diagnosis not present

## 2018-08-17 DIAGNOSIS — I4892 Unspecified atrial flutter: Secondary | ICD-10-CM | POA: Diagnosis not present

## 2018-08-17 DIAGNOSIS — R413 Other amnesia: Secondary | ICD-10-CM | POA: Diagnosis not present

## 2018-08-17 DIAGNOSIS — I5022 Chronic systolic (congestive) heart failure: Secondary | ICD-10-CM | POA: Diagnosis not present

## 2018-08-17 DIAGNOSIS — N183 Chronic kidney disease, stage 3 (moderate): Secondary | ICD-10-CM | POA: Diagnosis not present

## 2018-08-17 DIAGNOSIS — E119 Type 2 diabetes mellitus without complications: Secondary | ICD-10-CM | POA: Diagnosis not present

## 2018-09-12 DIAGNOSIS — H04123 Dry eye syndrome of bilateral lacrimal glands: Secondary | ICD-10-CM | POA: Diagnosis not present

## 2018-10-21 ENCOUNTER — Other Ambulatory Visit: Payer: Self-pay | Admitting: Family Medicine

## 2018-10-21 DIAGNOSIS — E119 Type 2 diabetes mellitus without complications: Secondary | ICD-10-CM

## 2018-10-21 LAB — HM DIABETES EYE EXAM

## 2018-10-23 DIAGNOSIS — R69 Illness, unspecified: Secondary | ICD-10-CM | POA: Diagnosis not present

## 2018-11-01 ENCOUNTER — Other Ambulatory Visit: Payer: Self-pay | Admitting: Family Medicine

## 2018-11-01 DIAGNOSIS — N183 Chronic kidney disease, stage 3 unspecified: Secondary | ICD-10-CM

## 2018-11-01 DIAGNOSIS — Z85828 Personal history of other malignant neoplasm of skin: Secondary | ICD-10-CM | POA: Diagnosis not present

## 2018-11-01 DIAGNOSIS — D2372 Other benign neoplasm of skin of left lower limb, including hip: Secondary | ICD-10-CM | POA: Diagnosis not present

## 2018-11-01 DIAGNOSIS — L820 Inflamed seborrheic keratosis: Secondary | ICD-10-CM | POA: Diagnosis not present

## 2018-11-01 DIAGNOSIS — D485 Neoplasm of uncertain behavior of skin: Secondary | ICD-10-CM | POA: Diagnosis not present

## 2018-11-01 DIAGNOSIS — L738 Other specified follicular disorders: Secondary | ICD-10-CM | POA: Diagnosis not present

## 2018-11-01 DIAGNOSIS — E119 Type 2 diabetes mellitus without complications: Secondary | ICD-10-CM

## 2018-11-01 DIAGNOSIS — L814 Other melanin hyperpigmentation: Secondary | ICD-10-CM | POA: Diagnosis not present

## 2018-11-01 DIAGNOSIS — L218 Other seborrheic dermatitis: Secondary | ICD-10-CM | POA: Diagnosis not present

## 2018-11-01 DIAGNOSIS — I429 Cardiomyopathy, unspecified: Secondary | ICD-10-CM

## 2018-11-01 DIAGNOSIS — Z08 Encounter for follow-up examination after completed treatment for malignant neoplasm: Secondary | ICD-10-CM | POA: Diagnosis not present

## 2018-11-01 DIAGNOSIS — L720 Epidermal cyst: Secondary | ICD-10-CM | POA: Diagnosis not present

## 2018-11-01 DIAGNOSIS — D044 Carcinoma in situ of skin of scalp and neck: Secondary | ICD-10-CM | POA: Diagnosis not present

## 2018-11-01 DIAGNOSIS — L821 Other seborrheic keratosis: Secondary | ICD-10-CM | POA: Diagnosis not present

## 2018-11-07 ENCOUNTER — Ambulatory Visit (INDEPENDENT_AMBULATORY_CARE_PROVIDER_SITE_OTHER): Payer: Medicare HMO | Admitting: Family Medicine

## 2018-11-07 ENCOUNTER — Encounter: Payer: Self-pay | Admitting: Family Medicine

## 2018-11-07 ENCOUNTER — Other Ambulatory Visit: Payer: Self-pay

## 2018-11-07 VITALS — BP 120/62 | HR 64 | Ht 71.0 in | Wt 195.0 lb

## 2018-11-07 DIAGNOSIS — R159 Full incontinence of feces: Secondary | ICD-10-CM | POA: Diagnosis not present

## 2018-11-07 DIAGNOSIS — R69 Illness, unspecified: Secondary | ICD-10-CM | POA: Diagnosis not present

## 2018-11-07 DIAGNOSIS — I429 Cardiomyopathy, unspecified: Secondary | ICD-10-CM

## 2018-11-07 DIAGNOSIS — N183 Chronic kidney disease, stage 3 unspecified: Secondary | ICD-10-CM

## 2018-11-07 DIAGNOSIS — E1169 Type 2 diabetes mellitus with other specified complication: Secondary | ICD-10-CM

## 2018-11-07 DIAGNOSIS — E119 Type 2 diabetes mellitus without complications: Secondary | ICD-10-CM

## 2018-11-07 DIAGNOSIS — E785 Hyperlipidemia, unspecified: Secondary | ICD-10-CM

## 2018-11-07 DIAGNOSIS — N401 Enlarged prostate with lower urinary tract symptoms: Secondary | ICD-10-CM | POA: Diagnosis not present

## 2018-11-07 DIAGNOSIS — F5102 Adjustment insomnia: Secondary | ICD-10-CM

## 2018-11-07 MED ORDER — TAMSULOSIN HCL 0.4 MG PO CAPS: 0.4000 mg | ORAL_CAPSULE | Freq: Every day | ORAL | 1 refills | Status: DC

## 2018-11-07 MED ORDER — SIMVASTATIN 80 MG PO TABS: 80.0000 mg | ORAL_TABLET | Freq: Every evening | ORAL | 1 refills | Status: DC

## 2018-11-07 MED ORDER — FUROSEMIDE 20 MG PO TABS: 20.0000 mg | ORAL_TABLET | Freq: Every day | ORAL | 1 refills | Status: DC

## 2018-11-07 MED ORDER — TRAZODONE HCL 50 MG PO TABS: 50.0000 mg | ORAL_TABLET | Freq: Every day | ORAL | 2 refills | Status: DC

## 2018-11-07 MED ORDER — LOSARTAN POTASSIUM 100 MG PO TABS: 50.0000 mg | ORAL_TABLET | Freq: Every day | ORAL | 1 refills | Status: DC

## 2018-11-07 MED ORDER — METFORMIN HCL 1000 MG PO TABS: 1000.0000 mg | ORAL_TABLET | Freq: Two times a day (BID) | ORAL | 1 refills | Status: DC

## 2018-11-07 MED ORDER — CARVEDILOL 3.125 MG PO TABS: 3.1250 mg | ORAL_TABLET | Freq: Two times a day (BID) | ORAL | 1 refills | Status: DC

## 2018-11-07 NOTE — Progress Notes (Signed)
Date:  11/07/2018   Name:  Cody Howard   DOB:  1934/04/04   MRN:  [id number]   Chief Complaint: Diabetes (110-120), Hypertension, Hyperlipidemia, and Benign Prostatic Hypertrophy  Diabetes He presents for his follow-up diabetic visit. He has type 2 diabetes mellitus. His disease course has been stable. There are no hypoglycemic associated symptoms. Pertinent negatives for hypoglycemia include no dizziness, headaches, nervousness/anxiousness or sweats. Pertinent negatives for diabetes include no blurred vision, no chest pain, no fatigue, no foot paresthesias, no foot ulcerations, no polydipsia, no polyphagia, no polyuria, no visual change, no weakness and no weight loss. There are no hypoglycemic complications. Symptoms are stable. There are no diabetic complications. Pertinent negatives for diabetic complications include no CVA, PVD or retinopathy. Risk factors for coronary artery disease include dyslipidemia, diabetes mellitus, hypertension, male sex and post-menopausal. Current diabetic treatment includes oral agent (monotherapy). He is compliant with treatment most of the time. He is following a generally healthy diet. Meal planning includes avoidance of concentrated sweets and carbohydrate counting. His breakfast blood glucose is taken between 8-9 am. His breakfast blood glucose range is generally 110-130 mg/dl. An ACE inhibitor/angiotensin II receptor blocker is being taken. He does not see a podiatrist.Eye exam is current.  Hypertension This is a chronic problem. The current episode started more than 1 year ago. The problem is unchanged. Pertinent negatives include no anxiety, blurred vision, chest pain, headaches, malaise/fatigue, neck pain, orthopnea, palpitations, peripheral edema, PND, shortness of breath or sweats. There are no associated agents to hypertension. Risk factors for coronary artery disease include dyslipidemia, diabetes mellitus, male gender and obesity. Past treatments  include angiotensin blockers, beta blockers, alpha 1 blockers and diuretics (lasix edema). The current treatment provides moderate improvement. There are no compliance problems.  There is no history of angina, kidney disease, CAD/MI, CVA, heart failure, left ventricular hypertrophy, PVD or retinopathy. There is no history of chronic renal disease, a hypertension causing med or renovascular disease.  Hyperlipidemia This is a chronic problem. The current episode started more than 1 year ago. The problem is controlled. Recent lipid tests were reviewed and are normal. Exacerbating diseases include diabetes. He has no history of chronic renal disease, liver disease or obesity. Pertinent negatives include no chest pain, focal sensory loss, focal weakness, leg pain, myalgias or shortness of breath. Current antihyperlipidemic treatment includes statins. The current treatment provides mild improvement of lipids. There are no compliance problems.   Benign Prostatic Hypertrophy This is a chronic problem. The problem has been waxing and waning since onset. Irritative symptoms include frequency and nocturia. Irritative symptoms do not include urgency. Obstructive symptoms do not include dribbling, incomplete emptying, an intermittent stream, a slower stream, straining or a weak stream. Pertinent negatives include no chills, dysuria, genital pain, hematuria, hesitancy, nausea or vomiting. Nothing aggravates the symptoms. Past treatments include tamsulosin. The treatment provided moderate relief.  GI Problem The primary symptoms include diarrhea. Primary symptoms do not include fever, weight loss, fatigue, abdominal pain, nausea, vomiting, melena, hematemesis, jaundice, hematochezia, dysuria, myalgias, arthralgias or rash.  The illness does not include chills, anorexia, dysphagia, odynophagia, bloating, constipation, tenesmus, back pain or itching. Associated medical issues do not include liver disease.  Insomnia Primary  symptoms: difficulty falling asleep, no malaise/fatigue.  The current episode started more than one month. The problem has been resolved since onset.    Review of Systems  Constitutional: Negative for chills, fatigue, fever, malaise/fatigue and weight loss.  HENT: Negative for drooling, ear discharge, ear  pain and sore throat.   Eyes: Negative for blurred vision.  Respiratory: Negative for cough, shortness of breath and wheezing.   Cardiovascular: Negative for chest pain, palpitations, orthopnea, leg swelling and PND.  Gastrointestinal: Positive for diarrhea. Negative for abdominal pain, anorexia, bloating, blood in stool, constipation, dysphagia, hematemesis, hematochezia, jaundice, melena, nausea and vomiting.  Endocrine: Negative for polydipsia, polyphagia and polyuria.  Genitourinary: Positive for frequency and nocturia. Negative for dysuria, hematuria, hesitancy, incomplete emptying and urgency.  Musculoskeletal: Negative for arthralgias, back pain, myalgias and neck pain.  Skin: Negative for itching and rash.  Allergic/Immunologic: Negative for environmental allergies.  Neurological: Negative for dizziness, focal weakness, weakness and headaches.  Hematological: Does not bruise/bleed easily.  Psychiatric/Behavioral: Negative for suicidal ideas. The patient has insomnia. The patient is not nervous/anxious.     Patient Active Problem List   Diagnosis Date Noted  . Dizziness 04/12/2018  . TIA (transient ischemic attack) 01/25/2018  . Chronic systolic CHF (congestive heart failure), NYHA class 3 ([place]) 11/29/2017  . CKD (chronic kidney disease) stage 3, GFR 30-59 ml/min (HCC) 08/18/2017  . Bradycardia 08/18/2017  . Atrial flutter, paroxysmal ([place]) 07/21/2017  . Functional dyspnea 07/06/2017  . Cardiomyopathy ([place]) 07/06/2017  . Hyperlipidemia, mixed 06/20/2017  . Hyperlipidemia associated with type 2 diabetes mellitus ([place]) 04/06/2016  . Type 2 diabetes mellitus without  complications ([place]) 12/87/8676  . Personal history of other diseases of male genital organs 02/10/2015  . Sebaceous cyst 08/28/2014    No Known Allergies  Past Surgical History:  Procedure Laterality Date  . COLONOSCOPY N/A 02/26/2015   Procedure: COLONOSCOPY;  Surgeon: Hulen Luster, MD;  Location: [facility];  Service: Gastroenterology;  Laterality: N/A;  . COLONOSCOPY WITH PROPOFOL N/A 11/02/2017   Procedure: COLONOSCOPY WITH PROPOFOL;  Surgeon: Toledo, Benay Pike, MD;  Location: [facility];  Service: Gastroenterology;  Laterality: N/A;  . ESOPHAGOGASTRODUODENOSCOPY N/A 02/26/2015   Procedure: ESOPHAGOGASTRODUODENOSCOPY (EGD);  Surgeon: Hulen Luster, MD;  Location: [facility];  Service: Gastroenterology;  Laterality: N/A;  Diabetic - oral meds  . ESOPHAGOGASTRODUODENOSCOPY (EGD) WITH PROPOFOL N/A 11/02/2017   Procedure: ESOPHAGOGASTRODUODENOSCOPY (EGD) WITH PROPOFOL;  Surgeon: Toledo, Benay Pike, MD;  Location: [facility];  Service: Gastroenterology;  Laterality: N/A;  . KIDNEY SURGERY Right    surgery when 84 years old  . POLYPECTOMY  02/26/2015   Procedure: POLYPECTOMY;  Surgeon: Hulen Luster, MD;  Location: [facility];  Service: Gastroenterology;;  . sebaceous cyst removal Right    located right of spine on upper back    Social History   Tobacco Use  . Smoking status: Former Smoker    Quit date: 03/08/1978    Years since quitting: 40.6  . Smokeless tobacco: Never Used  Substance Use Topics  . Alcohol use: Yes    Alcohol/week: 0.0 standard drinks    Comment: Rarely  . Drug use: No     Medication list has been reviewed and updated.  Current Meds  Medication Sig  . ACCU-CHEK SOFTCLIX LANCETS lancets TEST BLOOD SUGAR EVERY DAY  . Blood Glucose Monitoring Suppl (ACCU-CHEK AVIVA PLUS) w/Device KIT USE AS DIRECTED  . carvedilol (COREG) 3.125 MG tablet Take 1 tablet (3.125 mg total) by mouth 2 (two) times daily with a meal.  . diphenhydrAMINE (BENADRYL)  50 MG capsule Take 50 mg by mouth. qhs  . donepezil (ARICEPT) 10 MG tablet Take 1 tablet by mouth daily. [place]  . furosemide (LASIX) 20 MG tablet TAKE 1 TABLET BY MOUTH  EVERY DAY  . losartan (COZAAR) 100 MG tablet TAKE 1/2 TABLET BY MOUTH EVERY DAY  . Melatonin 5 MG TABS Take 1 tablet by mouth at bedtime.  . metFORMIN (GLUCOPHAGE) 1000 MG tablet TAKE 1 TABLET BY MOUTH TWICE A DAY  . Omega-3 Fatty Acids (FISH OIL) 1000 MG CAPS Take 1 capsule (1,000 mg total) by mouth daily.  Glory Rosebush ULTRA test strip USE TO TEST ONCE A DAY  . simvastatin (ZOCOR) 80 MG tablet TAKE 1 TABLET BY MOUTH EVERY EVENING  . tamsulosin (FLOMAX) 0.4 MG CAPS capsule TAKE 1 CAPSULE BY MOUTH EVERY DAY    PHQ 2/9 Scores 05/09/2018 11/29/2016 08/07/2015 03/14/2015  PHQ - 2 Score 0 0 0 0  PHQ- 9 Score 0 0 - -    BP Readings from Last 3 Encounters:  11/07/18 120/62  05/10/18 130/68  05/09/18 108/68    Physical Exam Vitals signs and nursing note reviewed.  Constitutional:      Appearance: Normal appearance. He is normal weight.  HENT:     Head: Normocephalic.     Right Ear: Tympanic membrane, ear canal and external ear normal.     Left Ear: Tympanic membrane, ear canal and external ear normal.     Nose: Nose normal. No congestion or rhinorrhea.  Eyes:     General: No scleral icterus.       Right eye: No discharge.        Left eye: No discharge.     Conjunctiva/sclera: Conjunctivae normal.     Pupils: Pupils are equal, round, and reactive to light.  Neck:     Musculoskeletal: Full passive range of motion without pain, normal range of motion and neck supple.     Thyroid: No thyroid mass, thyromegaly or thyroid tenderness.     Vascular: Normal carotid pulses. No carotid bruit, hepatojugular reflux or JVD.     Trachea: No tracheal deviation.  Cardiovascular:     Rate and Rhythm: Normal rate and regular rhythm.     Chest Wall: PMI is not displaced. No thrill.     Pulses:          Carotid pulses are 1+ on the  right side and 1+ on the left side.      Radial pulses are 1+ on the right side and 1+ on the left side.       Femoral pulses are 1+ on the right side and 1+ on the left side.      Popliteal pulses are 1+ on the right side and 1+ on the left side.       Dorsalis pedis pulses are 1+ on the right side and 1+ on the left side.       Posterior tibial pulses are 1+ on the right side and 1+ on the left side.     Heart sounds: Normal heart sounds. No murmur. No systolic murmur. No diastolic murmur. No friction rub. No gallop. No S3 or S4 sounds.   Pulmonary:     Effort: No respiratory distress.     Breath sounds: Normal breath sounds. No wheezing or rales.  Abdominal:     General: Bowel sounds are normal. There is no distension.     Palpations: Abdomen is soft. There is no hepatomegaly, splenomegaly or mass.     Tenderness: There is no abdominal tenderness. There is no guarding or rebound.  Genitourinary:    Prostate: Normal. Not enlarged, not tender and no nodules present.     Rectum:  Guaiac result negative. Abnormal anal tone.  Musculoskeletal: Normal range of motion.        General: No tenderness.     Right lower leg: No edema.     Left lower leg: No edema.  Lymphadenopathy:     Cervical: No cervical adenopathy.     Right cervical: No superficial, deep or posterior cervical adenopathy.    Left cervical: No superficial, deep or posterior cervical adenopathy.  Skin:    General: Skin is warm.     Findings: No rash.  Neurological:     Mental Status: He is alert and oriented to person, place, and time.     Cranial Nerves: No cranial nerve deficit.     Deep Tendon Reflexes: Reflexes are normal and symmetric.     Wt Readings from Last 3 Encounters:  11/07/18 195 lb (88.5 kg)  05/10/18 199 lb 3.2 oz (90.4 kg)  05/09/18 198 lb 12.8 oz (90.2 kg)    BP 120/62   Pulse 64   Ht '5\' 11"'  (1.803 m)   Wt 195 lb (88.5 kg)   BMI 27.20 kg/m   Assessment and Plan: 1. Cardiomyopathy, unspecified  type [facility]) Patient with history of cardiomyopathy presumably from diabetes may be hypertension in the past.  We will continue furosemide 20 mg 1 twice a day and losartan 100 mg twice a day.  Will check comprehensive metabolic panel.  We will continue carvedilol 3.125 mg twice a day with meals. - Comprehensive Metabolic Panel (CMET) - furosemide (LASIX) 20 MG tablet; Take 1 tablet (20 mg total) by mouth daily.  Dispense: 90 tablet; Refill: 1 - losartan (COZAAR) 100 MG tablet; Take 0.5 tablets (50 mg total) by mouth daily.  Dispense: 45 tablet; Refill: 1  2. CKD (chronic kidney disease) stage 3, GFR 30-59 ml/min (HCC) Patient with history of CKD stage III with controlled diabetes.  We will continue losartan 100 mg half a tablet once a day. - losartan (COZAAR) 100 MG tablet; Take 0.5 tablets (50 mg total) by mouth daily.  Dispense: 45 tablet; Refill: 1  3. Type 2 diabetes mellitus without complication, without long-term current use of insulin (HCC) Chronic.  Controlled.  Will continue metformin 1 g twice a day.  At this time we will check his A1c microalbuminuria and comprehensive metabolic panel. - HgB U0A - Microalbumin, urine - Comprehensive Metabolic Panel (CMET) - metFORMIN (GLUCOPHAGE) 1000 MG tablet; Take 1 tablet (1,000 mg total) by mouth 2 (two) times daily.  Dispense: 180 tablet; Refill: 1  4. Hyperlipidemia associated with type 2 diabetes mellitus (HCC) Chronic.  Controlled.  Continue simvastatin 80 mg once a day. - simvastatin (ZOCOR) 80 MG tablet; Take 1 tablet (80 mg total) by mouth every evening.  Dispense: 90 tablet; Refill: 1  5. Benign prostatic hyperplasia with lower urinary tract symptoms, symptom details unspecified Patient symptomatically is stable with his benign prostatic hypertrophy plasia.  We will continue tamsulosin 0.4 mg once a day. - tamsulosin (FLOMAX) 0.4 MG CAPS capsule; Take 1 capsule (0.4 mg total) by mouth daily.  Dispense: 90 capsule; Refill: 1 l sphincter.  6. Encopresis Patient has noted the acute onset over the past month of recurrent encopresis that it happens every couple days that he has had lab inability to get to the bathroom in time for bowel movement.  This is somewhat semisolid.  This is new onset and on rectal exam was noted that the anal sphincter tone was decreased.  Referral to gastroenterology for evaluation. - Ambulatory  referral to Gastroenterology  7. Adjustment insomnia Patient is currently taking Aricept at night for his dementia but this is not carrying him through the evening and patient is having early awakening with inability to readdress sleep.  We will give patient some trazodone to take at night to see if we can continue to sleep on a regular basis.

## 2018-11-08 LAB — COMPREHENSIVE METABOLIC PANEL
ALT: 15 IU/L (ref 0–44)
AST: 16 IU/L (ref 0–40)
Albumin/Globulin Ratio: 2.2 (ref 1.2–2.2)
Albumin: 4.3 g/dL (ref 3.6–4.6)
Alkaline Phosphatase: 81 IU/L (ref 39–117)
BUN/Creatinine Ratio: 14 (ref 10–24)
BUN: 33 mg/dL — ABNORMAL HIGH (ref 8–27)
Bilirubin Total: 0.2 mg/dL (ref 0.0–1.2)
CO2: 22 mmol/L (ref 20–29)
Calcium: 8.9 mg/dL (ref 8.6–10.2)
Chloride: 104 mmol/L (ref 96–106)
Creatinine, Ser: 2.33 mg/dL — ABNORMAL HIGH (ref 0.76–1.27)
GFR calc Af Amer: 29 mL/min/{1.73_m2} — ABNORMAL LOW (ref >59)
GFR calc non Af Amer: 25 mL/min/{1.73_m2} — ABNORMAL LOW (ref >59)
Globulin, Total: 2 g/dL (ref 1.5–4.5)
Glucose: 135 mg/dL — ABNORMAL HIGH (ref 65–99)
Potassium: 4.9 mmol/L (ref 3.5–5.2)
Sodium: 141 mmol/L (ref 134–144)
Total Protein: 6.3 g/dL (ref 6.0–8.5)

## 2018-11-08 LAB — MICROALBUMIN, URINE: Microalbumin, Urine: 78.8 ug/mL

## 2018-11-08 LAB — HEMOGLOBIN A1C
Est. average glucose Bld gHb Est-mCnc: 143 mg/dL
Hgb A1c MFr Bld: 6.6 % — ABNORMAL HIGH (ref 4.8–5.6)

## 2018-11-15 DIAGNOSIS — R159 Full incontinence of feces: Secondary | ICD-10-CM | POA: Diagnosis not present

## 2018-11-15 DIAGNOSIS — R195 Other fecal abnormalities: Secondary | ICD-10-CM | POA: Diagnosis not present

## 2018-11-15 DIAGNOSIS — R194 Change in bowel habit: Secondary | ICD-10-CM | POA: Diagnosis not present

## 2018-11-16 ENCOUNTER — Other Ambulatory Visit: Payer: Medicare HMO

## 2018-11-16 DIAGNOSIS — R413 Other amnesia: Secondary | ICD-10-CM | POA: Diagnosis not present

## 2018-11-16 DIAGNOSIS — R7989 Other specified abnormal findings of blood chemistry: Secondary | ICD-10-CM | POA: Diagnosis not present

## 2018-11-17 ENCOUNTER — Other Ambulatory Visit: Payer: Self-pay

## 2018-11-17 DIAGNOSIS — R194 Change in bowel habit: Secondary | ICD-10-CM | POA: Diagnosis not present

## 2018-11-17 DIAGNOSIS — R195 Other fecal abnormalities: Secondary | ICD-10-CM | POA: Diagnosis not present

## 2018-11-17 DIAGNOSIS — R7989 Other specified abnormal findings of blood chemistry: Secondary | ICD-10-CM

## 2018-11-17 LAB — CREATININE, SERUM
Creatinine, Ser: 1.89 mg/dL — ABNORMAL HIGH (ref 0.76–1.27)
GFR calc Af Amer: 38 mL/min/{1.73_m2} — ABNORMAL LOW (ref >59)
GFR calc non Af Amer: 33 mL/min/{1.73_m2} — ABNORMAL LOW (ref >59)

## 2018-11-27 DIAGNOSIS — D044 Carcinoma in situ of skin of scalp and neck: Secondary | ICD-10-CM | POA: Diagnosis not present

## 2018-11-27 DIAGNOSIS — L570 Actinic keratosis: Secondary | ICD-10-CM | POA: Diagnosis not present

## 2018-12-01 DIAGNOSIS — H10231 Serous conjunctivitis, except viral, right eye: Secondary | ICD-10-CM | POA: Diagnosis not present

## 2018-12-06 DIAGNOSIS — Z48817 Encounter for surgical aftercare following surgery on the skin and subcutaneous tissue: Secondary | ICD-10-CM | POA: Diagnosis not present

## 2018-12-06 DIAGNOSIS — D044 Carcinoma in situ of skin of scalp and neck: Secondary | ICD-10-CM | POA: Diagnosis not present

## 2018-12-11 DIAGNOSIS — E1122 Type 2 diabetes mellitus with diabetic chronic kidney disease: Secondary | ICD-10-CM | POA: Diagnosis not present

## 2018-12-11 DIAGNOSIS — D631 Anemia in chronic kidney disease: Secondary | ICD-10-CM | POA: Diagnosis not present

## 2018-12-11 DIAGNOSIS — N1832 Chronic kidney disease, stage 3b: Secondary | ICD-10-CM | POA: Diagnosis not present

## 2018-12-11 DIAGNOSIS — I10 Essential (primary) hypertension: Secondary | ICD-10-CM | POA: Diagnosis not present

## 2018-12-13 ENCOUNTER — Other Ambulatory Visit: Payer: Self-pay | Admitting: Nephrology

## 2018-12-13 DIAGNOSIS — N1832 Chronic kidney disease, stage 3b: Secondary | ICD-10-CM

## 2018-12-15 DIAGNOSIS — R69 Illness, unspecified: Secondary | ICD-10-CM | POA: Diagnosis not present

## 2018-12-19 ENCOUNTER — Other Ambulatory Visit: Payer: Self-pay

## 2018-12-19 ENCOUNTER — Ambulatory Visit
Admission: RE | Admit: 2018-12-19 | Discharge: 2018-12-19 | Disposition: A | Discharge status: Home / Self Care | Payer: Medicare HMO | Source: Ambulatory Visit | Attending: Nephrology | Admitting: Nephrology

## 2018-12-19 ENCOUNTER — Encounter (INDEPENDENT_AMBULATORY_CARE_PROVIDER_SITE_OTHER): Payer: Self-pay

## 2018-12-19 DIAGNOSIS — N1832 Chronic kidney disease, stage 3b: Secondary | ICD-10-CM | POA: Diagnosis not present

## 2018-12-19 DIAGNOSIS — N183 Chronic kidney disease, stage 3 unspecified: Secondary | ICD-10-CM | POA: Diagnosis not present

## 2018-12-20 DIAGNOSIS — Z48817 Encounter for surgical aftercare following surgery on the skin and subcutaneous tissue: Secondary | ICD-10-CM | POA: Diagnosis not present

## 2018-12-25 ENCOUNTER — Telehealth: Payer: Self-pay

## 2018-12-25 MED ORDER — ONDANSETRON HCL 4 MG PO TABS: 4.0000 mg | ORAL_TABLET | Freq: Three times a day (TID) | ORAL | 0 refills | Status: DC | PRN

## 2018-12-25 NOTE — Telephone Encounter (Signed)
ife called patient vomited 6 times Sat and 3 Times Sunday. No Diarrhea and no fever. Called in Zofran 4 mg per PCP.

## 2019-01-01 ENCOUNTER — Encounter: Payer: Self-pay | Admitting: Emergency Medicine

## 2019-01-01 ENCOUNTER — Other Ambulatory Visit: Payer: Self-pay

## 2019-01-01 ENCOUNTER — Emergency Department: Payer: Medicare HMO

## 2019-01-01 ENCOUNTER — Emergency Department
Admission: EM | Admit: 2019-01-01 | Discharge: 2019-01-01 | Disposition: A | Discharge status: Home / Self Care | Payer: Medicare HMO | Attending: Emergency Medicine | Admitting: Emergency Medicine

## 2019-01-01 DIAGNOSIS — R112 Nausea with vomiting, unspecified: Secondary | ICD-10-CM | POA: Diagnosis present

## 2019-01-01 DIAGNOSIS — E1122 Type 2 diabetes mellitus with diabetic chronic kidney disease: Secondary | ICD-10-CM | POA: Diagnosis not present

## 2019-01-01 DIAGNOSIS — Z7984 Long term (current) use of oral hypoglycemic drugs: Secondary | ICD-10-CM | POA: Insufficient documentation

## 2019-01-01 DIAGNOSIS — N183 Chronic kidney disease, stage 3 unspecified: Secondary | ICD-10-CM | POA: Diagnosis not present

## 2019-01-01 DIAGNOSIS — R1110 Vomiting, unspecified: Secondary | ICD-10-CM | POA: Diagnosis not present

## 2019-01-01 DIAGNOSIS — I129 Hypertensive chronic kidney disease with stage 1 through stage 4 chronic kidney disease, or unspecified chronic kidney disease: Secondary | ICD-10-CM | POA: Insufficient documentation

## 2019-01-01 DIAGNOSIS — Z03818 Encounter for observation for suspected exposure to other biological agents ruled out: Secondary | ICD-10-CM | POA: Diagnosis not present

## 2019-01-01 DIAGNOSIS — Z87891 Personal history of nicotine dependence: Secondary | ICD-10-CM | POA: Diagnosis not present

## 2019-01-01 DIAGNOSIS — R1111 Vomiting without nausea: Secondary | ICD-10-CM

## 2019-01-01 DIAGNOSIS — Z79899 Other long term (current) drug therapy: Secondary | ICD-10-CM | POA: Diagnosis not present

## 2019-01-01 DIAGNOSIS — Z20828 Contact with and (suspected) exposure to other viral communicable diseases: Secondary | ICD-10-CM | POA: Diagnosis not present

## 2019-01-01 DIAGNOSIS — R1013 Epigastric pain: Secondary | ICD-10-CM | POA: Diagnosis not present

## 2019-01-01 DIAGNOSIS — K4090 Unilateral inguinal hernia, without obstruction or gangrene, not specified as recurrent: Secondary | ICD-10-CM | POA: Diagnosis not present

## 2019-01-01 DIAGNOSIS — R001 Bradycardia, unspecified: Secondary | ICD-10-CM | POA: Diagnosis not present

## 2019-01-01 DIAGNOSIS — N200 Calculus of kidney: Secondary | ICD-10-CM | POA: Diagnosis not present

## 2019-01-01 DIAGNOSIS — R110 Nausea: Secondary | ICD-10-CM | POA: Diagnosis not present

## 2019-01-01 DIAGNOSIS — R519 Headache, unspecified: Secondary | ICD-10-CM | POA: Diagnosis not present

## 2019-01-01 DIAGNOSIS — K8020 Calculus of gallbladder without cholecystitis without obstruction: Secondary | ICD-10-CM | POA: Diagnosis not present

## 2019-01-01 DIAGNOSIS — K5730 Diverticulosis of large intestine without perforation or abscess without bleeding: Secondary | ICD-10-CM | POA: Diagnosis not present

## 2019-01-01 LAB — COMPREHENSIVE METABOLIC PANEL
ALT: 15 U/L (ref 0–44)
AST: 21 U/L (ref 15–41)
Albumin: 4.2 g/dL (ref 3.5–5.0)
Alkaline Phosphatase: 82 U/L (ref 38–126)
Anion gap: 13 (ref 5–15)
BUN: 32 mg/dL — ABNORMAL HIGH (ref 8–23)
CO2: 25 mmol/L (ref 22–32)
Calcium: 9.3 mg/dL (ref 8.9–10.3)
Chloride: 101 mmol/L (ref 98–111)
Creatinine, Ser: 2.01 mg/dL — ABNORMAL HIGH (ref 0.61–1.24)
GFR calc Af Amer: 35 mL/min — ABNORMAL LOW (ref >60)
GFR calc non Af Amer: 30 mL/min — ABNORMAL LOW (ref >60)
Glucose, Bld: 231 mg/dL — ABNORMAL HIGH (ref 70–99)
Potassium: 4.9 mmol/L (ref 3.5–5.1)
Sodium: 139 mmol/L (ref 135–145)
Total Bilirubin: 0.7 mg/dL (ref 0.3–1.2)
Total Protein: 7.3 g/dL (ref 6.5–8.1)

## 2019-01-01 LAB — URINALYSIS, COMPLETE (UACMP) WITH MICROSCOPIC
Bacteria, UA: NONE SEEN
Bilirubin Urine: NEGATIVE
Glucose, UA: NEGATIVE mg/dL
Hgb urine dipstick: NEGATIVE
Ketones, ur: NEGATIVE mg/dL
Leukocytes,Ua: NEGATIVE
Nitrite: NEGATIVE
Protein, ur: 100 mg/dL — AB
Specific Gravity, Urine: 1.013 (ref 1.005–1.030)
Squamous Epithelial / HPF: NONE SEEN (ref 0–5)
pH: 5 (ref 5.0–8.0)

## 2019-01-01 LAB — CBC
HCT: 37.7 % — ABNORMAL LOW (ref 39.0–52.0)
Hemoglobin: 12.5 g/dL — ABNORMAL LOW (ref 13.0–17.0)
MCH: 33.2 pg (ref 26.0–34.0)
MCHC: 33.2 g/dL (ref 30.0–36.0)
MCV: 100.3 fL — ABNORMAL HIGH (ref 80.0–100.0)
Platelets: 152 10*3/uL (ref 150–400)
RBC: 3.76 MIL/uL — ABNORMAL LOW (ref 4.22–5.81)
RDW: 12.5 % (ref 11.5–15.5)
WBC: 11.1 10*3/uL — ABNORMAL HIGH (ref 4.0–10.5)
nRBC: 0 % (ref 0.0–0.2)

## 2019-01-01 LAB — LIPASE, BLOOD: Lipase: 55 U/L — ABNORMAL HIGH (ref 11–51)

## 2019-01-01 LAB — TROPONIN I (HIGH SENSITIVITY): Troponin I (High Sensitivity): 11 ng/L (ref <18)

## 2019-01-01 NOTE — Discharge Instructions (Addendum)
Your work-up was reassuring.  Your CT showed some incidental findings as shown below.  Your gallbladder does have some stones in it.  If you develop right upper quadrant pain or continued vomiting after eating you may need to talk to them about having it removed.  You should stay quarantined at home until your coronavirus test comes back.     1. No acute abdominopelvic abnormality. 2. Cholelithiasis without acute inflammation. 3. Nonobstructive right nephrolithiasis. 4. Rectosigmoid diverticulosis without acute inflammation. 5. Small fat containing left inguinal hernia. 6. There is a 3 mm pulmonary nodule in the right lower lobe. No follow-up needed if patient is low-risk. Non-contrast chest CT can be considered in 12 months if patient is high-risk. This recommendation follows the consensus statement: Guidelines for Management of Incidental Pulmonary Nodules Detected on CT Images: From the Fleischner Society 2017; Radiology 2017; 284:228-243.

## 2019-01-01 NOTE — ED Triage Notes (Signed)
Pt to ED from c/o nausea and vomiting since 10/17.  States emesis x6 then, felt better and then intermittent vomiting since then.  Denies pain, SOB, fevers, or cough.  Chest rise even and unlabored, skin WNL, in NAD at this time.

## 2019-01-01 NOTE — ED Notes (Signed)
ED Provider at bedside. 

## 2019-01-01 NOTE — ED Provider Notes (Signed)
[facility] Emergency Department Provider Note  ____________________________________________   First MD Initiated Contact with Patient 01/01/19 1356     (approximate)  I have reviewed the triage vital signs and the nursing notes.   HISTORY  Chief Complaint Nausea and Emesis    HPI Cody Howard is a 84 y.o. male with diabetes, CKD, hypertension, hyperlipidemia, A. Fib not on anticoagulation who presents with nausea and emesis.  Patient is had nausea and vomiting since 10/17.  The vomiting has been intermittent in nature, initially was 6 times but now occurs every other day for a few episodes.  The vomiting is moderate, nonbloody nonbilious, better with Zofran, worse after eating.  Usually occurs after eating but he still feels hungry afterwards.  He denies any shortness of breath but has had some indigestion and chest pain as well with it.          Past Medical History:  Diagnosis Date   AF (atrial fibrillation) (HCC)    Arthritis    hands   Chronic kidney disease    Diabetes mellitus without complication ([employer])    Hyperlipidemia    Hypertension    Prostate enlargement     Patient Active Problem List   Diagnosis Date Noted   Dizziness 04/12/2018   TIA (transient ischemic attack) 35/36/1443   Chronic systolic CHF (congestive heart failure), NYHA class 3 ([place]) 11/29/2017   CKD (chronic kidney disease) stage 3, GFR 30-59 ml/min 08/18/2017   Bradycardia 08/18/2017   Atrial flutter, paroxysmal ([place]) 07/21/2017   Functional dyspnea 07/06/2017   Cardiomyopathy ([place]) 07/06/2017   Hyperlipidemia, mixed 06/20/2017   Hyperlipidemia associated with type 2 diabetes mellitus ([place]) 04/06/2016   Type 2 diabetes mellitus without complications ([place]) 15/40/0867   Personal history of other diseases of male genital organs 02/10/2015   Sebaceous cyst 08/28/2014    Past Surgical History:  Procedure Laterality Date   COLONOSCOPY N/A  02/26/2015   Procedure: COLONOSCOPY;  Surgeon: Hulen Luster, MD;  Location: [place];  Service: Gastroenterology;  Laterality: N/A;   COLONOSCOPY WITH PROPOFOL N/A 11/02/2017   Procedure: COLONOSCOPY WITH PROPOFOL;  Surgeon: Toledo, Benay Pike, MD;  Location: [facility];  Service: Gastroenterology;  Laterality: N/A;   ESOPHAGOGASTRODUODENOSCOPY N/A 02/26/2015   Procedure: ESOPHAGOGASTRODUODENOSCOPY (EGD);  Surgeon: Hulen Luster, MD;  Location: [place];  Service: Gastroenterology;  Laterality: N/A;  Diabetic - oral meds   ESOPHAGOGASTRODUODENOSCOPY (EGD) WITH PROPOFOL N/A 11/02/2017   Procedure: ESOPHAGOGASTRODUODENOSCOPY (EGD) WITH PROPOFOL;  Surgeon: Toledo, Benay Pike, MD;  Location: [facility];  Service: Gastroenterology;  Laterality: N/A;   KIDNEY SURGERY Right    surgery when 84 years old   POLYPECTOMY  02/26/2015   Procedure: POLYPECTOMY;  Surgeon: Hulen Luster, MD;  Location: [place];  Service: Gastroenterology;;   sebaceous cyst removal Right    located right of spine on upper back    Prior to Admission medications   Medication Sig Start Date End Date Taking? Authorizing Provider  ACCU-CHEK SOFTCLIX LANCETS lancets TEST BLOOD SUGAR EVERY DAY 09/19/15   [name], MD  Blood Glucose Monitoring Suppl (ACCU-CHEK AVIVA PLUS) w/Device KIT USE AS DIRECTED 03/11/15   [name], MD  carvedilol (COREG) 3.125 MG tablet Take 1 tablet (3.125 mg total) by mouth 2 (two) times daily with a meal. 11/07/18   [name], MD  diphenhydrAMINE (BENADRYL) 50 MG capsule Take 50 mg by mouth. qhs    [provider]  donepezil (ARICEPT) 10 MG tablet Take 1 tablet by mouth daily. Chi[place] Herb 08/17/18   [provider]  furosemide (LASIX) 20 MG tablet Take 1 tablet (20 mg total) by mouth daily. 11/07/18   [name], MD  losartan (COZAAR) 100 MG tablet Take 0.5 tablets (50 mg total) by mouth daily. 11/07/18   [name], MD  Melatonin 5 MG  TABS Take 1 tablet by mouth at bedtime.    [provider]  metFORMIN (GLUCOPHAGE) 1000 MG tablet Take 1 tablet (1,000 mg total) by mouth 2 (two) times daily. 11/07/18   [name], MD  Omega-3 Fatty Acids (FISH OIL) 1000 MG CAPS Take 1 capsule (1,000 mg total) by mouth daily. 02/06/18   [name], MD  ondansetron (ZOFRAN) 4 MG tablet Take 1 tablet (4 mg total) by mouth every 8 (eight) hours as needed for nausea or vomiting. 12/25/18   [name], MD  [facility] ULTRA test strip USE TO TEST ONCE A DAY 10/23/18   [name], MD  simvastatin (ZOCOR) 80 MG tablet Take 1 tablet (80 mg total) by mouth every evening. 11/07/18   [name], MD  tamsulosin (FLOMAX) 0.4 MG CAPS capsule Take 1 capsule (0.4 mg total) by mouth daily. 11/07/18   [name], MD  traZODone (DESYREL) 50 MG tablet Take 1 tablet (50 mg total) by mouth at bedtime. For sleep 11/07/18   [name], MD    Allergies Patient has no known allergies.  Family History  Problem Relation Age of Onset   Diabetes Father    Heart disease Father     Social History Social History   Tobacco Use   Smoking status: Former Smoker    Quit date: 03/08/1978    Years since quitting: 40.8   Smokeless tobacco: Never Used  Substance Use Topics   Alcohol use: Yes    Alcohol/week: 0.0 standard drinks    Comment: Rarely   Drug use: No      Review of Systems Constitutional: No fever/chills Eyes: No visual changes. ENT: No sore throat. Cardiovascular: Denies chest pain. Respiratory: Denies shortness of breath. Gastrointestinal: Positive vomiting and epigastric abdominal pain.  No diarrhea.  No constipation. Genitourinary: Negative for dysuria. Musculoskeletal: Negative for back pain. Skin: Negative for rash. Neurological: Negative for headaches, focal weakness or numbness. All other ROS negative ____________________________________________   PHYSICAL EXAM:  VITAL SIGNS: ED Triage Vitals  [01/01/19 1158]  Enc Vitals Group     BP (!) 156/58     Pulse Rate 65     Resp 14     Temp 97.7 F (36.5 C)     Temp Source Oral     SpO2 98 %     Weight 190 lb (86.2 kg)     Height '5\' 11"'  (1.803 m)     Head Circumference      Peak Flow      Pain Score 0     Pain Loc      Pain Edu?      Excl. in [place]?     Constitutional: Alert and oriented. Well appearing and in no acute distress. Eyes: Conjunctivae are normal. EOMI. Head: Atraumatic. Nose: No congestion/rhinnorhea. Mouth/Throat: Mucous membranes are moist.   Neck: No stridor. Trachea Midline. FROM Cardiovascular: Normal rate, regular rhythm. Grossly normal heart sounds.  Good peripheral circulation. Respiratory: Normal respiratory effort.  No retractions. Lungs CTAB. Gastrointestinal: Soft with slight epigastric tenderness.. No distention. No abdominal  bruits.  Musculoskeletal: No lower extremity tenderness nor edema.  No joint effusions. Neurologic:  Normal speech and language. No gross focal neurologic deficits are appreciated.  Cranial nerves II through XII are intact. Skin:  Skin is warm, dry and intact. No rash noted. Psychiatric: Mood and affect are normal. Speech and behavior are normal. GU: Deferred   ____________________________________________   LABS (all labs ordered are listed, but only abnormal results are displayed)  Labs Reviewed  LIPASE, BLOOD - Abnormal; Notable for the following components:      Result Value   Lipase 55 (*)    All other components within normal limits  COMPREHENSIVE METABOLIC PANEL - Abnormal; Notable for the following components:   Glucose, Bld 231 (*)    BUN 32 (*)    Creatinine, Ser 2.01 (*)    GFR calc non Af Amer 30 (*)    GFR calc Af Amer 35 (*)    All other components within normal limits  CBC - Abnormal; Notable for the following components:   WBC 11.1 (*)    RBC 3.76 (*)    Hemoglobin 12.5 (*)    HCT 37.7 (*)    MCV 100.3 (*)    All other components within normal  limits  URINALYSIS, COMPLETE (UACMP) WITH MICROSCOPIC - Abnormal; Notable for the following components:   Color, Urine YELLOW (*)    APPearance CLEAR (*)    Protein, ur 100 (*)    All other components within normal limits  TROPONIN I (HIGH SENSITIVITY)   ____________________________________________   ED ECG REPORT I, Vanessa [place], the attending physician, personally viewed and interpreted this ECG.  EKG is sinus bradycardia rate of 53, no ST elevation, T wave inversion in aVL, normal intervals ____________________________________________  RADIOLOGY   Official radiology report(s): Ct Abdomen Pelvis Wo Contrast  Result Date: 01/01/2019 CLINICAL DATA:  Nausea and vomiting EXAM: CT ABDOMEN AND PELVIS WITHOUT CONTRAST TECHNIQUE: Multidetector CT imaging of the abdomen and pelvis was performed following the standard protocol without IV contrast. COMPARISON:  None. FINDINGS: Lower chest: There is a 3 mm pulmonary nodule in the right lower lobe (axial series 3, image 34). There are multiple calcified granulomas in the left lower lobe.The heart size is normal. Hepatobiliary: The liver is normal. Cholelithiasis without acute inflammation.There is no biliary ductal dilation. Pancreas: Normal contours without ductal dilatation. No peripancreatic fluid collection. Spleen: No splenic laceration or hematoma. Adrenals/Urinary Tract: --Adrenal glands: No adrenal hemorrhage. --Right kidney/ureter: There are stones in a dependent aspect of a lower pole calyx on the right. There is no hydronephrosis. There is a prominent extrarenal pelvis. --Left kidney/ureter: No hydronephrosis or perinephric hematoma. --Urinary bladder: Unremarkable. Stomach/Bowel: --Stomach/Duodenum: The stomach is moderately distended. --Small bowel: No dilatation or inflammation. --Colon: Rectosigmoid diverticulosis without acute inflammation. --Appendix: Normal. Vascular/Lymphatic: Atherosclerotic calcification is present within the  non-aneurysmal abdominal aorta, without hemodynamically significant stenosis. --No retroperitoneal lymphadenopathy. --No mesenteric lymphadenopathy. --No pelvic or inguinal lymphadenopathy. Reproductive: The prostate gland is enlarged. Other: No ascites or free air. There is a small fat containing left inguinal hernia. Musculoskeletal. No acute displaced fractures. IMPRESSION: 1. No acute abdominopelvic abnormality. 2. Cholelithiasis without acute inflammation. 3. Nonobstructive right nephrolithiasis. 4. Rectosigmoid diverticulosis without acute inflammation. 5. Small fat containing left inguinal hernia. 6. There is a 3 mm pulmonary nodule in the right lower lobe. No follow-up needed if patient is low-risk. Non-contrast chest CT can be considered in 12 months if patient is high-risk. This recommendation follows the consensus statement: Guidelines  for Management of Incidental Pulmonary Nodules Detected on CT Images: From the Fleischner Society 2017; Radiology 2017; [phone]. Aortic Atherosclerosis (ICD10-I70.0). Electronically Signed   By: Constance Holster M.D.   On: 01/01/2019 15:05   Ct Head Wo Contrast  Result Date: 01/01/2019 CLINICAL DATA:  Headache, acute, normal neuro exam. Additional history provided: Patient presents to emergency department with nausea and vomiting since 10/17. EXAM: CT HEAD WITHOUT CONTRAST TECHNIQUE: Contiguous axial images were obtained from the base of the skull through the vertex without intravenous contrast. COMPARISON:  Head CT 06/14/2017 FINDINGS: Brain: No evidence of acute intracranial hemorrhage. No demarcated cortical infarction. No evidence of intracranial mass. No midline shift or extra-axial fluid collection. Mild scattered hypoattenuation within the cerebral white matter consistent with chronic small vessel ischemic disease. Moderate generalized parenchymal atrophy. Vascular: No hyperdense vessel Skull: Normal. Negative for fracture or focal lesion. Sinuses/Orbits:  Visualized orbits demonstrate no acute abnormality. Trace ethmoid sinus mucosal thickening at the imaged levels. No significant mastoid effusion. Other: There is a region of induration within the right frontoparietal scalp. IMPRESSION: 1. No CT evidence of acute intracranial abnormality. 2. Generalized parenchymal atrophy with chronic small vessel ischemic disease 3. Region of induration within the right frontoparietal scalp. Clinical correlation is recommended. Electronically Signed   By: Kellie Simmering DO   On: 01/01/2019 15:07    ____________________________________________   PROCEDURES  Procedure(s) performed (including Critical Care):  Procedures   ____________________________________________   INITIAL IMPRESSION / ASSESSMENT AND PLAN / ED COURSE  Cody Howard was evaluated in Emergency Department on 01/01/2019 for the symptoms described in the history of present illness. He was evaluated in the context of the global COVID-19 pandemic, which necessitated consideration that the patient might be at risk for infection with the SARS-CoV-2 virus that causes COVID-19. Institutional protocols and algorithms that pertain to the evaluation of patients at risk for COVID-19 are in a state of rapid change based on information released by regulatory bodies including the CDC and federal and state organizations. These policies and algorithms were followed during the patient's care in the ED.    Patient presents with vomiting for over a week now that is intermittent.  Patient has no other symptoms therefore viral infection seems to be less likely.  Occurs after eating any patient still feels hungry afterwards and he has had like a 8 pound weight loss over the past 1 week.  Concerned about possible cancer or mass causing obstruction.  Will get CT scan without contrast to evaluate his abdomen.  As well as a CT head to evaluate for intracranial tumor.  Will get labs evaluate for electrolyte abnormalities.   Given the chest pain that he describes indigestion will also get EKG and troponin although my suspicion is less likely for this.  Lipase is slightly elevated at 55 but this is unlikely to be a sign of pancreatitis.  Kidney function is around baseline at 2.  Hemoglobin is at baseline.  White count slightly elevated 11.1.  UA with 100 protein but no evidence of UTI.  CT scans are negative for acute pathology.  Discussed with patient that the cholelithiasis could cause some upper abdominal pain and that he can follow up with a surgeon as needed for elective removal if he continues to have symptoms.  Patient is also requesting coronavirus testing.  Patient instructed to quarantine at home until results come back.  Patient already has Zofran at home.  Patient has had no vomiting while in the emergency department.  He feels comfortable be discharged home.  I discussed the provisional nature of ED diagnosis, the treatment so far, the ongoing plan of care, follow up appointments and return precautions with the patient and any family or support people present. They expressed understanding and agreed with the plan, discharged home.  ____________________________________   FINAL CLINICAL IMPRESSION(S) / ED DIAGNOSES   Final diagnoses:  Vomiting without nausea, intractability of vomiting not specified, unspecified vomiting type      MEDICATIONS GIVEN DURING THIS VISIT:  Medications - No data to display   ED Discharge Orders    None       Note:  This document was prepared using Dragon voice recognition software and may include unintentional dictation errors.   Vanessa [place], MD 01/01/19 1525

## 2019-01-02 LAB — NOVEL CORONAVIRUS, NAA (HOSP ORDER, SEND-OUT TO REF LAB; TAT 18-24 HRS): SARS-CoV-2, NAA: NOT DETECTED

## 2019-01-15 DIAGNOSIS — E1122 Type 2 diabetes mellitus with diabetic chronic kidney disease: Secondary | ICD-10-CM | POA: Diagnosis not present

## 2019-01-15 DIAGNOSIS — I10 Essential (primary) hypertension: Secondary | ICD-10-CM | POA: Diagnosis not present

## 2019-01-15 DIAGNOSIS — D631 Anemia in chronic kidney disease: Secondary | ICD-10-CM | POA: Diagnosis not present

## 2019-01-15 DIAGNOSIS — N1832 Chronic kidney disease, stage 3b: Secondary | ICD-10-CM | POA: Diagnosis not present

## 2019-01-22 ENCOUNTER — Other Ambulatory Visit: Payer: Self-pay | Admitting: Family Medicine

## 2019-02-20 ENCOUNTER — Other Ambulatory Visit: Payer: Self-pay

## 2019-02-20 ENCOUNTER — Ambulatory Visit (INDEPENDENT_AMBULATORY_CARE_PROVIDER_SITE_OTHER): Payer: Medicare HMO | Admitting: Family Medicine

## 2019-02-20 ENCOUNTER — Encounter: Payer: Self-pay | Admitting: Family Medicine

## 2019-02-20 VITALS — BP 118/60 | HR 80 | Ht 71.0 in | Wt 188.0 lb

## 2019-02-20 DIAGNOSIS — R5383 Other fatigue: Secondary | ICD-10-CM

## 2019-02-20 DIAGNOSIS — I420 Dilated cardiomyopathy: Secondary | ICD-10-CM | POA: Diagnosis not present

## 2019-02-20 DIAGNOSIS — R55 Syncope and collapse: Secondary | ICD-10-CM | POA: Diagnosis not present

## 2019-02-20 DIAGNOSIS — R5381 Other malaise: Secondary | ICD-10-CM

## 2019-02-20 DIAGNOSIS — E1169 Type 2 diabetes mellitus with other specified complication: Secondary | ICD-10-CM | POA: Diagnosis not present

## 2019-02-20 DIAGNOSIS — E119 Type 2 diabetes mellitus without complications: Secondary | ICD-10-CM

## 2019-02-20 DIAGNOSIS — E785 Hyperlipidemia, unspecified: Secondary | ICD-10-CM

## 2019-02-20 DIAGNOSIS — R42 Dizziness and giddiness: Secondary | ICD-10-CM | POA: Diagnosis not present

## 2019-02-20 DIAGNOSIS — I5022 Chronic systolic (congestive) heart failure: Secondary | ICD-10-CM | POA: Diagnosis not present

## 2019-02-20 DIAGNOSIS — R112 Nausea with vomiting, unspecified: Secondary | ICD-10-CM | POA: Diagnosis not present

## 2019-02-20 DIAGNOSIS — E782 Mixed hyperlipidemia: Secondary | ICD-10-CM | POA: Diagnosis not present

## 2019-02-20 DIAGNOSIS — I4892 Unspecified atrial flutter: Secondary | ICD-10-CM | POA: Diagnosis not present

## 2019-02-20 NOTE — Progress Notes (Signed)
Date:  02/20/2019   Name:  Cody Howard   DOB:  1934-04-19   MRN:  [id number]   Chief Complaint: Fatigue (nauseated spells and weakness)  Thyroid Problem Presents for initial visit. Symptoms include fatigue. Patient reports no anxiety, cold intolerance, constipation, depressed mood, diaphoresis, diarrhea, dry skin, hair loss, heat intolerance, hoarse voice, leg swelling, nail problem, palpitations, tremors, visual change, weight gain or weight loss. (Fecal incontinence) Symptom course: waxes and wanes. His past medical history is significant for hyperlipidemia.  Congestive Heart Failure Presents for follow-up visit. Associated symptoms include fatigue and near-syncope. Pertinent negatives include no abdominal pain, chest pain, chest pressure, claudication, edema, nocturia, orthopnea, palpitations, paroxysmal nocturnal dyspnea, shortness of breath or unexpected weight change.  Diabetes His disease course has been stable. Pertinent negatives for hypoglycemia include no dizziness, headaches, nervousness/anxiousness or tremors. Associated symptoms include fatigue. Pertinent negatives for diabetes include no chest pain, no polydipsia, no visual change and no weight loss. There are no hypoglycemic complications. There are no diabetic complications. Current diabetic treatment includes oral agent (monotherapy). He is compliant with treatment all of the time.  Hyperlipidemia This is a chronic problem. The current episode started more than 1 year ago. The problem is controlled. Recent lipid tests were reviewed and are normal. Pertinent negatives include no chest pain, myalgias or shortness of breath.  Hypertension This is a chronic problem. The current episode started more than 1 year ago. The problem is controlled. Pertinent negatives include no chest pain, headaches, neck pain, palpitations or shortness of breath. Past treatments include beta blockers, alpha 1 blockers, angiotensin blockers and  diuretics. The current treatment provides moderate improvement. Identifiable causes of hypertension include a thyroid problem.  Benign Prostatic Hypertrophy This is a chronic problem. The current episode started more than 1 year ago. Irritative symptoms do not include frequency, nocturia or urgency. Obstructive symptoms do not include dribbling, an intermittent stream, a slower stream or a weak stream. PVR 71. Pertinent negatives include no chills, dysuria, hematuria or nausea.    Lab Results  Component Value Date   CREATININE 2.01 (H) 01/01/2019   BUN 32 (H) 01/01/2019   NA 139 01/01/2019   K 4.9 01/01/2019   CL 101 01/01/2019   CO2 25 01/01/2019   Lab Results  Component Value Date   CHOL 158 02/06/2018   HDL 58 02/06/2018   LDLCALC 82 02/06/2018   TRIG 92 02/06/2018   CHOLHDL 2.3 11/29/2016   No results found for: TSH Lab Results  Component Value Date   HGBA1C 6.6 (H) 11/07/2018     Review of Systems  Constitutional: Positive for fatigue. Negative for chills, diaphoresis, fever, unexpected weight change, weight gain and weight loss.  HENT: Negative for drooling, ear discharge, ear pain, hoarse voice and sore throat.   Respiratory: Negative for cough, shortness of breath and wheezing.   Cardiovascular: Positive for near-syncope. Negative for chest pain, palpitations, claudication and leg swelling.  Gastrointestinal: Negative for abdominal pain, blood in stool, constipation, diarrhea and nausea.  Endocrine: Negative for cold intolerance, heat intolerance and polydipsia.  Genitourinary: Negative for dysuria, frequency, hematuria, nocturia and urgency.  Musculoskeletal: Negative for back pain, myalgias and neck pain.  Skin: Negative for rash.  Allergic/Immunologic: Negative for environmental allergies.  Neurological: Negative for dizziness, tremors and headaches.  Hematological: Does not bruise/bleed easily.  Psychiatric/Behavioral: Negative for suicidal ideas. The patient is  not nervous/anxious.     Patient Active Problem List   Diagnosis Date Noted  .  Dizziness 04/12/2018  . TIA (transient ischemic attack) 01/25/2018  . Chronic systolic CHF (congestive heart failure), NYHA class 3 ([place]) 11/29/2017  . CKD (chronic kidney disease) stage 3, GFR 30-59 ml/min 08/18/2017  . Bradycardia 08/18/2017  . Atrial flutter, paroxysmal ([place]) 07/21/2017  . Functional dyspnea 07/06/2017  . Cardiomyopathy ([place]) 07/06/2017  . Hyperlipidemia, mixed 06/20/2017  . Hyperlipidemia associated with type 2 diabetes mellitus ([place]) 04/06/2016  . Type 2 diabetes mellitus without complications ([place]) 88/28/0034  . Personal history of other diseases of male genital organs 02/10/2015  . Sebaceous cyst 08/28/2014    No Known Allergies  Past Surgical History:  Procedure Laterality Date  . COLONOSCOPY N/A 02/26/2015   Procedure: COLONOSCOPY;  Surgeon: Hulen Luster, MD;  Location: [place];  Service: Gastroenterology;  Laterality: N/A;  . COLONOSCOPY WITH PROPOFOL N/A 11/02/2017   Procedure: COLONOSCOPY WITH PROPOFOL;  Surgeon: Toledo, Benay Pike, MD;  Location: [facility];  Service: Gastroenterology;  Laterality: N/A;  . ESOPHAGOGASTRODUODENOSCOPY N/A 02/26/2015   Procedure: ESOPHAGOGASTRODUODENOSCOPY (EGD);  Surgeon: Hulen Luster, MD;  Location: [place];  Service: Gastroenterology;  Laterality: N/A;  Diabetic - oral meds  . ESOPHAGOGASTRODUODENOSCOPY (EGD) WITH PROPOFOL N/A 11/02/2017   Procedure: ESOPHAGOGASTRODUODENOSCOPY (EGD) WITH PROPOFOL;  Surgeon: Toledo, Benay Pike, MD;  Location: [facility];  Service: Gastroenterology;  Laterality: N/A;  . KIDNEY SURGERY Right    surgery when 84 years old  . POLYPECTOMY  02/26/2015   Procedure: POLYPECTOMY;  Surgeon: Hulen Luster, MD;  Location: [place];  Service: Gastroenterology;;  . sebaceous cyst removal Right    located right of spine on upper back    Social History   Tobacco Use  . Smoking status:  Former Smoker    Quit date: 03/08/1978    Years since quitting: 40.9  . Smokeless tobacco: Never Used  Substance Use Topics  . Alcohol use: Yes    Alcohol/week: 0.0 standard drinks    Comment: Rarely  . Drug use: No     Medication list has been reviewed and updated.  Current Meds  Medication Sig  . ACCU-CHEK SOFTCLIX LANCETS lancets TEST BLOOD SUGAR EVERY DAY  . Blood Glucose Monitoring Suppl (ACCU-CHEK AVIVA PLUS) w/Device KIT USE AS DIRECTED  . carvedilol (COREG) 3.125 MG tablet Take 1 tablet (3.125 mg total) by mouth 2 (two) times daily with a meal.  . diphenhydrAMINE (BENADRYL) 50 MG capsule Take 50 mg by mouth. qhs  . donepezil (ARICEPT) 10 MG tablet Take 1 tablet by mouth daily. Chipper Herb  . furosemide (LASIX) 20 MG tablet Take 1 tablet (20 mg total) by mouth daily. (Patient taking differently: Take 20 mg by mouth every morning. )  . losartan (COZAAR) 100 MG tablet Take 0.5 tablets (50 mg total) by mouth daily. (Patient taking differently: Take 50 mg by mouth daily before breakfast. )  . Melatonin 5 MG TABS Take 1 tablet by mouth at bedtime.  . metFORMIN (GLUCOPHAGE) 1000 MG tablet Take 1 tablet (1,000 mg total) by mouth 2 (two) times daily.  . Omega-3 Fatty Acids (FISH OIL) 1000 MG CAPS Take 1 capsule (1,000 mg total) by mouth daily.  . ondansetron (ZOFRAN) 4 MG tablet Take 1 tablet (4 mg total) by mouth every 8 (eight) hours as needed for nausea or vomiting.  Glory Rosebush ULTRA test strip USE TO TEST ONCE A DAY  . simvastatin (ZOCOR) 80 MG tablet Take 1 tablet (80 mg total) by mouth every evening. (Patient taking differently: Take 80  mg by mouth every morning. )  . tamsulosin (FLOMAX) 0.4 MG CAPS capsule Take 1 capsule (0.4 mg total) by mouth daily. (Patient taking differently: Take 0.4 mg by mouth daily before breakfast. )    PHQ 2/9 Scores 05/09/2018 11/29/2016 08/07/2015 03/14/2015  PHQ - 2 Score 0 0 0 0  PHQ- 9 Score 0 0 - -    BP Readings from Last 3 Encounters:  02/20/19  118/60  01/01/19 (!) 158/81  11/07/18 120/62    Physical Exam Vitals and nursing note reviewed.  HENT:     Head: Normocephalic.     Right Ear: Tympanic membrane, ear canal and external ear normal.     Left Ear: Tympanic membrane, ear canal and external ear normal.     Nose: Nose normal. No congestion or rhinorrhea.  Eyes:     General: No scleral icterus.       Right eye: No discharge.        Left eye: No discharge.     Conjunctiva/sclera: Conjunctivae normal.     Pupils: Pupils are equal, round, and reactive to light.  Neck:     Thyroid: No thyromegaly.     Vascular: No carotid bruit or JVD.     Trachea: No tracheal deviation.  Cardiovascular:     Rate and Rhythm: Normal rate and regular rhythm.     Heart sounds: Normal heart sounds. No murmur. No friction rub. No gallop.   Pulmonary:     Effort: No respiratory distress.     Breath sounds: Normal breath sounds. No wheezing, rhonchi or rales.  Abdominal:     General: Bowel sounds are normal.     Palpations: Abdomen is soft. There is no mass.     Tenderness: There is no abdominal tenderness. There is no guarding or rebound.  Musculoskeletal:        General: No tenderness. Normal range of motion.     Cervical back: Normal range of motion and neck supple. No rigidity or tenderness.  Lymphadenopathy:     Cervical: No cervical adenopathy.  Skin:    General: Skin is warm.     Coloration: Skin is not jaundiced or pale.     Findings: No rash.  Neurological:     Mental Status: He is alert and oriented to person, place, and time.     Cranial Nerves: No cranial nerve deficit.     Deep Tendon Reflexes: Reflexes are normal and symmetric.     Wt Readings from Last 3 Encounters:  02/20/19 188 lb (85.3 kg)  01/01/19 190 lb (86.2 kg)  11/07/18 195 lb (88.5 kg)    BP 118/60   Pulse 80   Ht '5\' 11"'  (1.803 m)   Wt 188 lb (85.3 kg)   BMI 26.22 kg/m    Assessment and Plan: 1. Malaise and fatigue Patient's had this ongoing  malaise and fatigue that he just has not felt well for several months.  Patient's is in the midst of work-up of cardiac, GI, and neurologic.  I am going to discontinue his Flomax seen that this is only been going on for about a year and this is the only new medication and that this may be causing his malaise and fatigue.  The residual does not seem to be that enormous and that we will see how the patient feels coming off the medication and he should be having an upcoming urology appointment this January to determine whether we need to resume this or not or  if this is helpful in terms of his symptomatology.  2. Hyperlipidemia associated with type 2 diabetes mellitus ([place]) First we went over hyperlipidemia and patient is under good control with atorvastatin 80 mg once a day.  3. Type 2 diabetes mellitus without complication, without long-term current use of insulin (HCC) We also looked at his A1c's which are in the 5-6 range and doing well on the current regimen of Metformin.  4. Dizziness Patient has had some dizziness and near syncopal episodes particularly at night when he gets up and has had some issues trying to arise from the toilet afterwards.  5. Non-intractable vomiting with nausea, unspecified vomiting type Earlier this year some issues with nausea and vomiting for which he was given Zofran and also some issues with fecal incontinence which is thought to be due to consistency of his stools which he is now controlling with diet.

## 2019-02-27 IMAGING — CR DG CHEST 2V
3 series · 3 of 3 positions shown · non-contrast
Comparison: No recent prior.

CLINICAL DATA: Nonproductive cough.

EXAM:
CHEST  2 VIEW

[chest pa (1 of 2)]
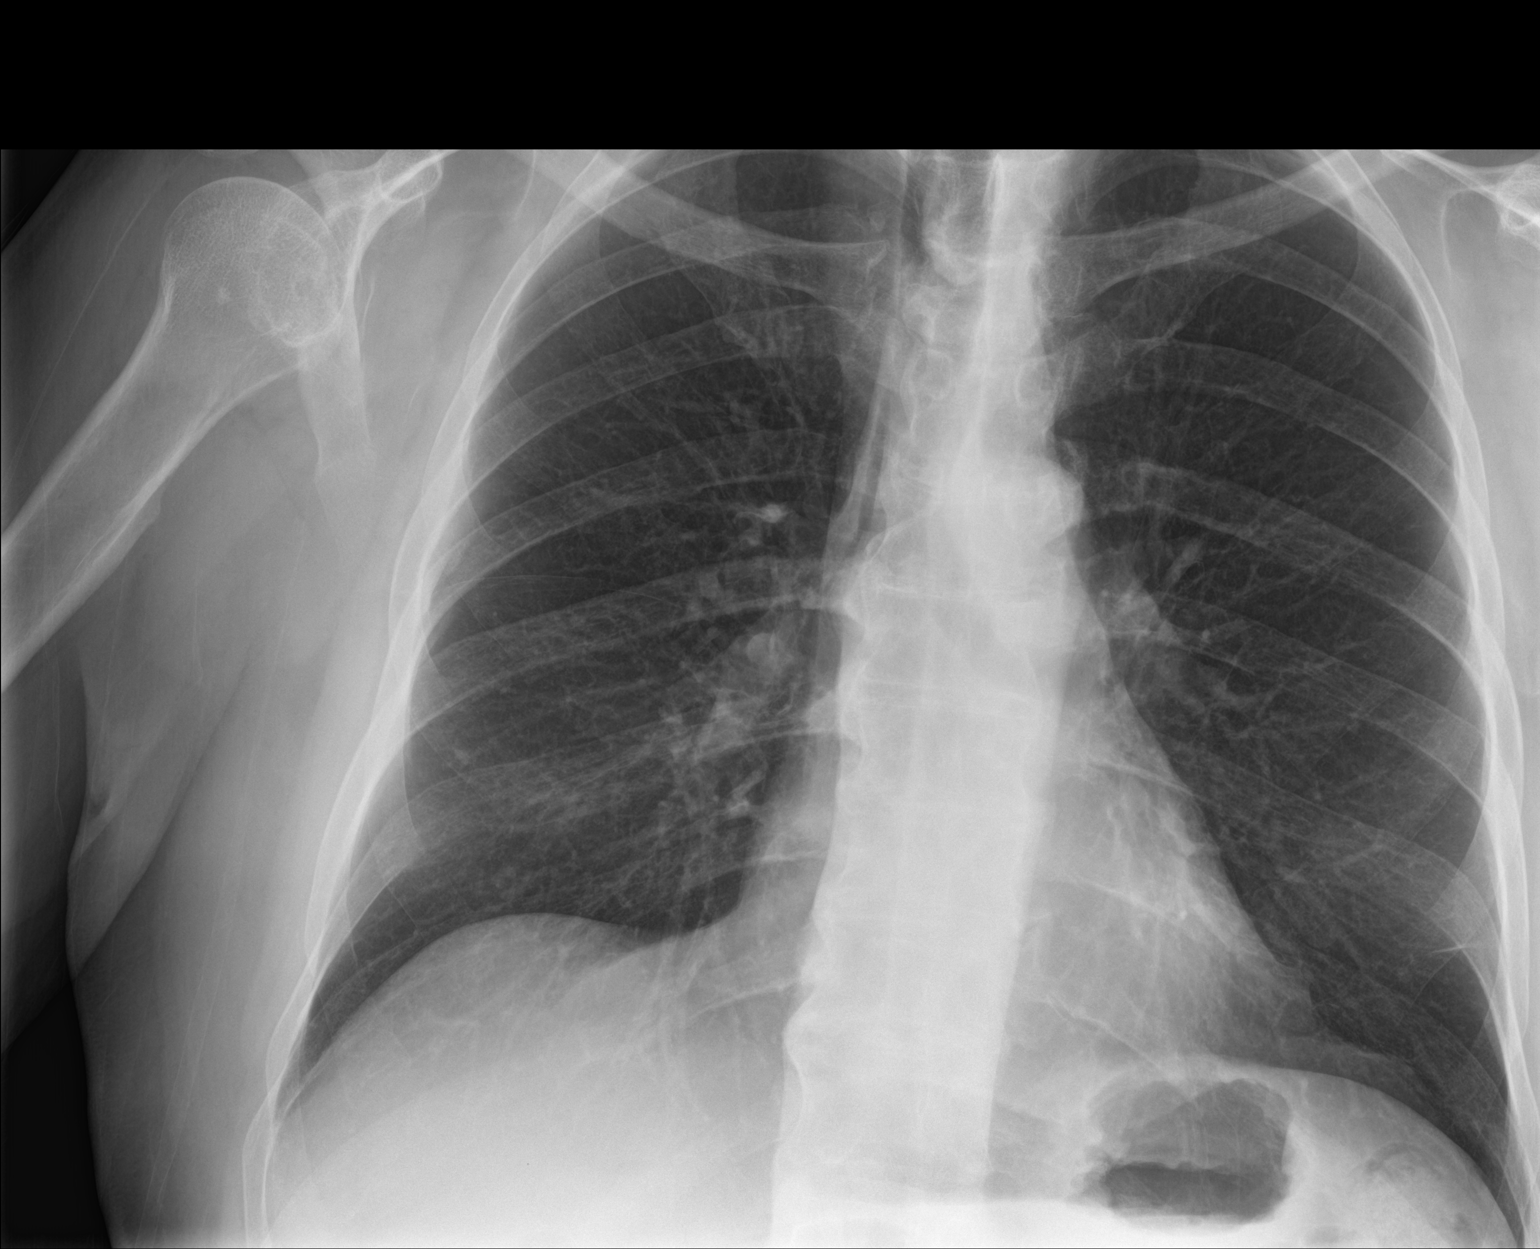

[chest pa (2 of 2)]
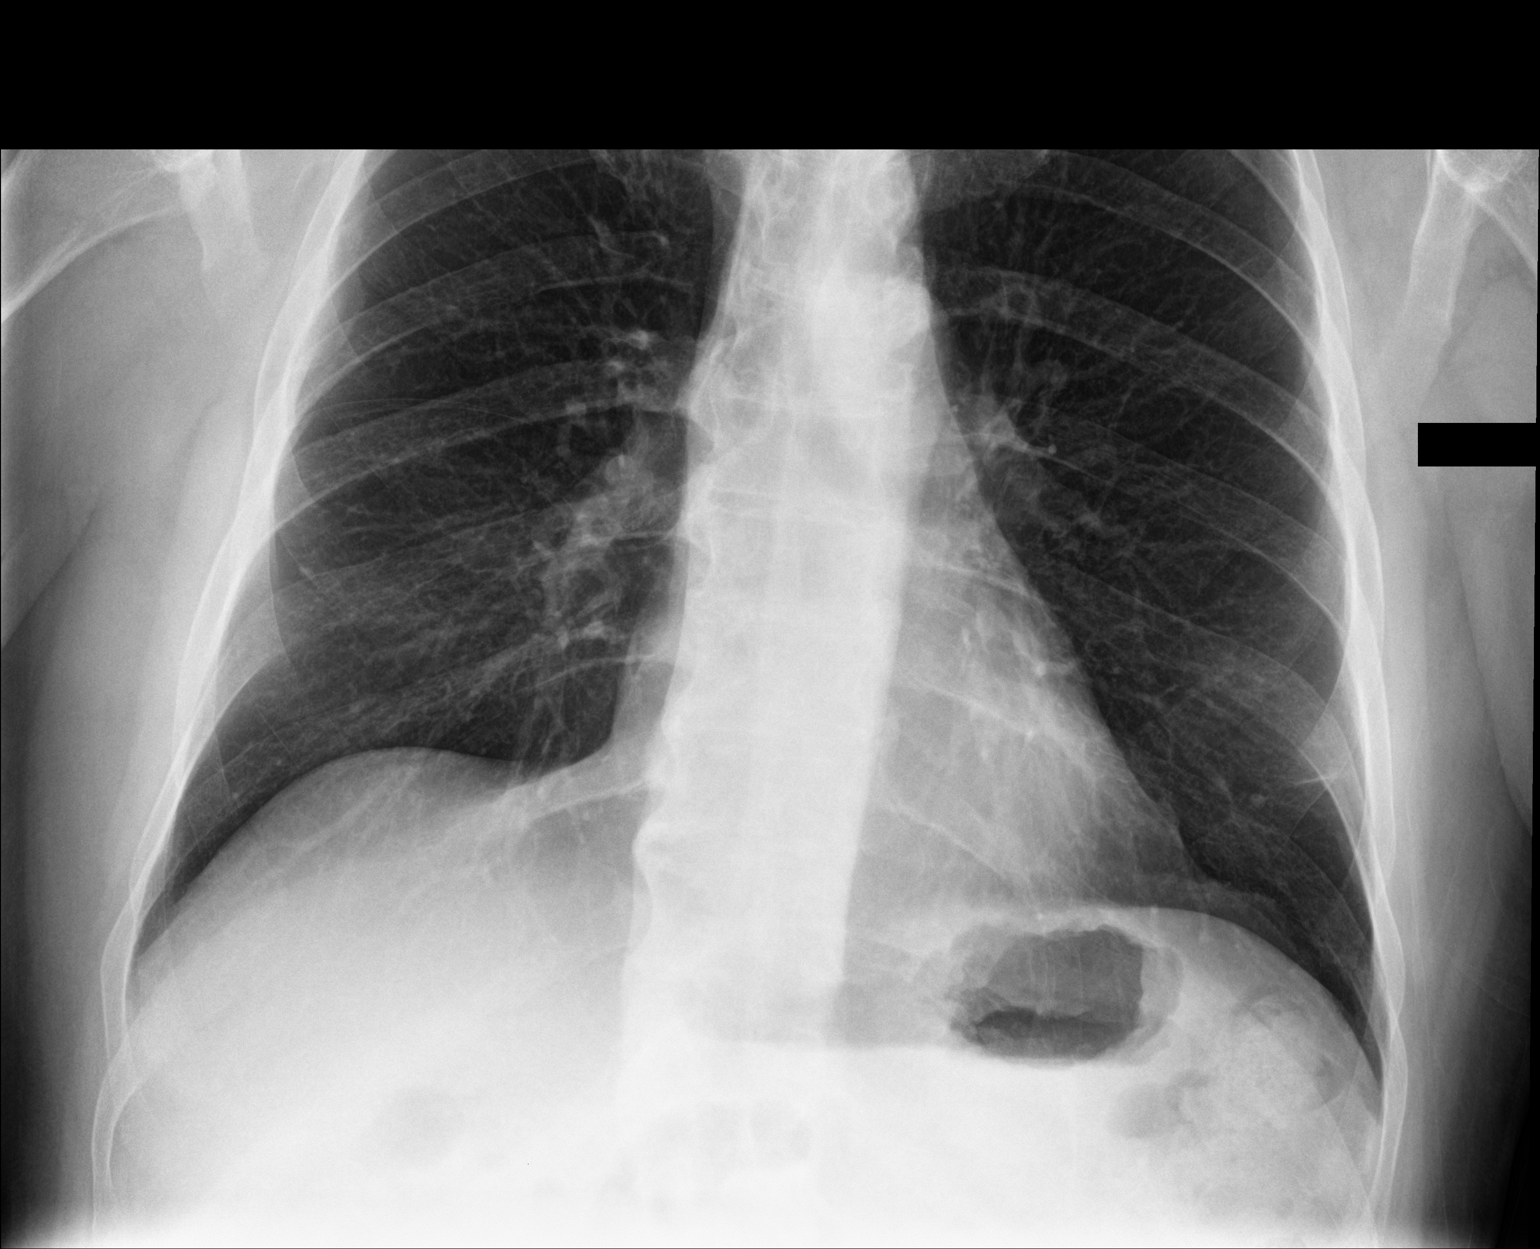

[chest lat]
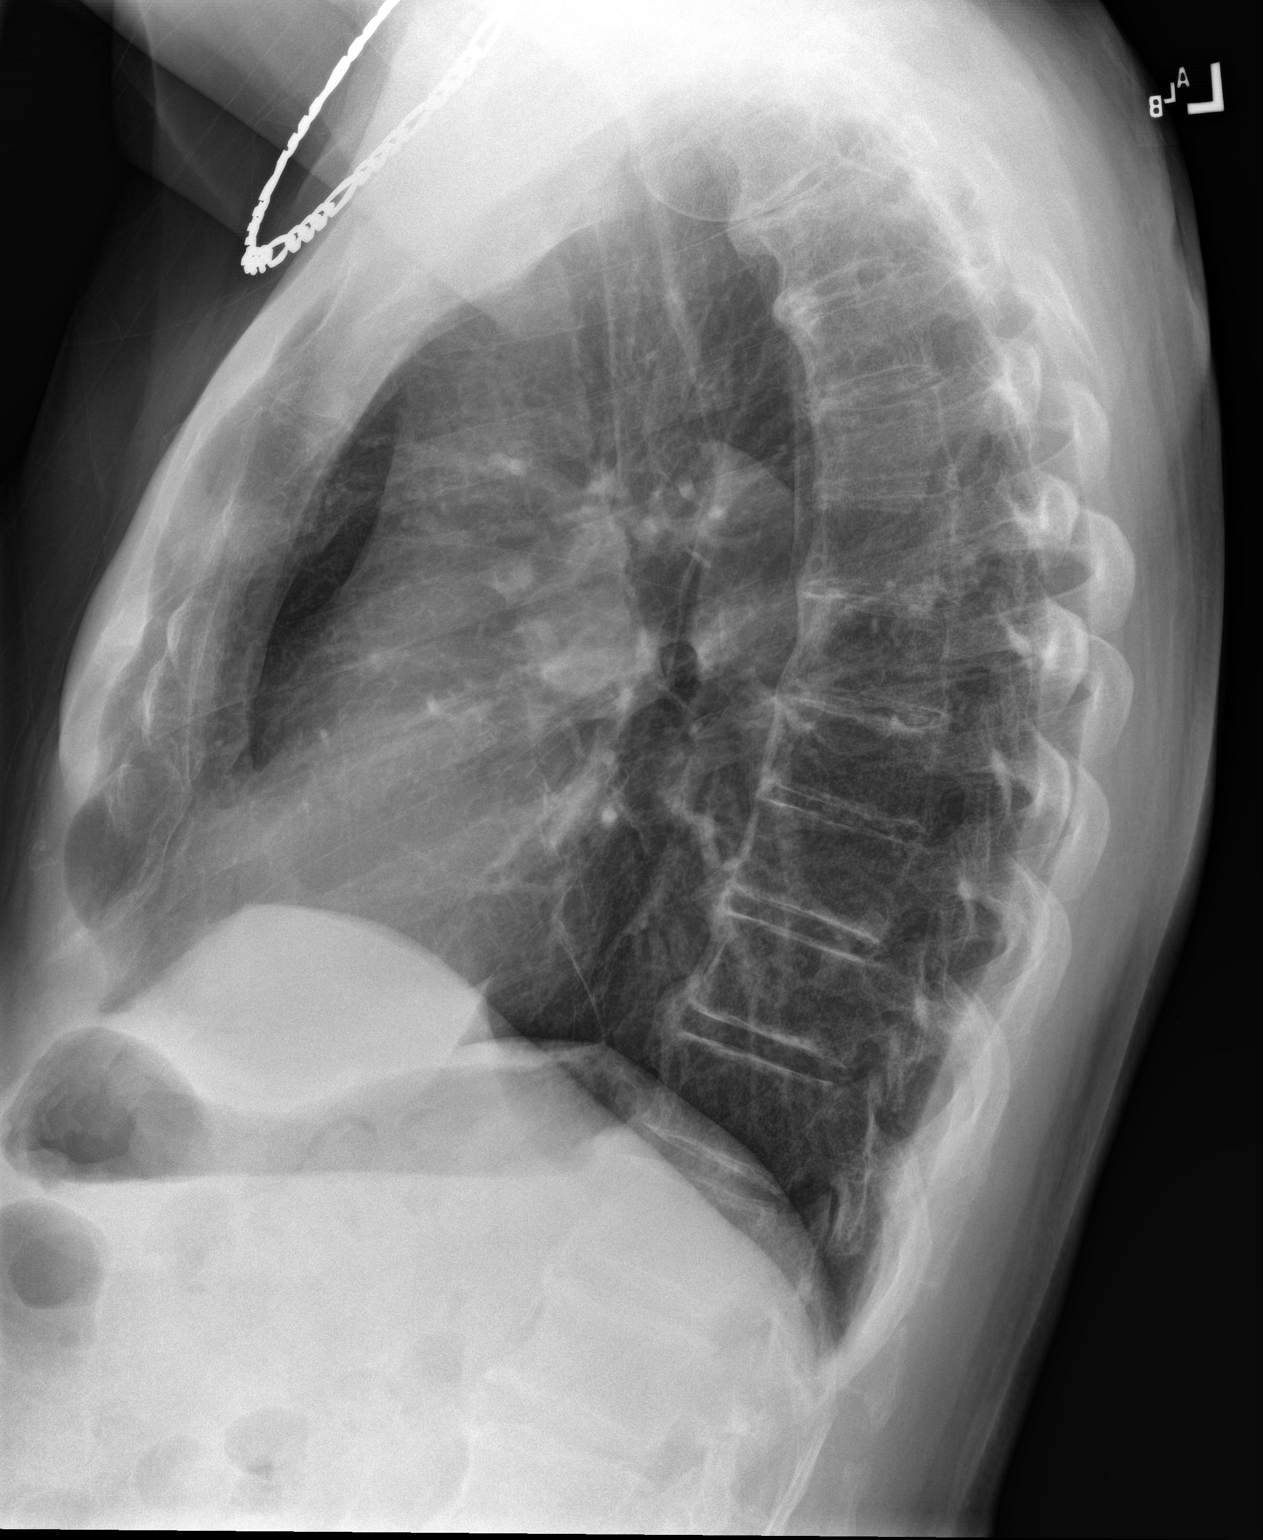

[3 of 3 positions shown; findings below may reference images not displayed]

FINDINGS: Mediastinum hilar structures normal. Biapical and left base
subsegmental atelectasis and/or scarring. No focal infiltrate. Not
pleural effusion or pneumothorax. Degenerative changes thoracic
spine .
IMPRESSION: Biapical and left base subsegmental atelectasis and/or scarring.
Exam otherwise unremarkable.

## 2019-02-28 DIAGNOSIS — R55 Syncope and collapse: Secondary | ICD-10-CM | POA: Diagnosis not present

## 2019-03-12 ENCOUNTER — Other Ambulatory Visit: Payer: Self-pay

## 2019-03-12 ENCOUNTER — Encounter: Payer: Self-pay | Admitting: Family Medicine

## 2019-03-12 ENCOUNTER — Ambulatory Visit (INDEPENDENT_AMBULATORY_CARE_PROVIDER_SITE_OTHER): Payer: Medicare HMO | Admitting: Family Medicine

## 2019-03-12 VITALS — BP 130/70 | HR 72 | Ht 71.0 in | Wt 189.0 lb

## 2019-03-12 DIAGNOSIS — E1169 Type 2 diabetes mellitus with other specified complication: Secondary | ICD-10-CM

## 2019-03-12 DIAGNOSIS — R159 Full incontinence of feces: Secondary | ICD-10-CM | POA: Diagnosis not present

## 2019-03-12 DIAGNOSIS — R5383 Other fatigue: Secondary | ICD-10-CM

## 2019-03-12 DIAGNOSIS — E785 Hyperlipidemia, unspecified: Secondary | ICD-10-CM

## 2019-03-12 DIAGNOSIS — R112 Nausea with vomiting, unspecified: Secondary | ICD-10-CM | POA: Diagnosis not present

## 2019-03-12 DIAGNOSIS — E119 Type 2 diabetes mellitus without complications: Secondary | ICD-10-CM

## 2019-03-12 DIAGNOSIS — R5381 Other malaise: Secondary | ICD-10-CM

## 2019-03-12 DIAGNOSIS — Z862 Personal history of diseases of the blood and blood-forming organs and certain disorders involving the immune mechanism: Secondary | ICD-10-CM

## 2019-03-12 DIAGNOSIS — D531 Other megaloblastic anemias, not elsewhere classified: Secondary | ICD-10-CM | POA: Diagnosis not present

## 2019-03-12 DIAGNOSIS — N401 Enlarged prostate with lower urinary tract symptoms: Secondary | ICD-10-CM | POA: Diagnosis not present

## 2019-03-12 MED ORDER — METFORMIN HCL ER 750 MG PO TB24: 750.0000 mg | ORAL_TABLET | Freq: Every day | ORAL | 1 refills | Status: DC

## 2019-03-12 NOTE — Progress Notes (Signed)
Date:  03/12/2019   Name:  Cody Howard   DOB:  09-03-1934   MRN:  [id number]   Chief Complaint: Follow-up (eating better, working more in garage- improved. Not having to take nausea med.)  Patient with followup malaise. Decreas of nausea and seems to be more active. Appetite has improve. But now has more frequent urination.  Benign Prostatic Hypertrophy This is a chronic problem. The current episode started more than 1 year ago. The problem has been waxing and waning since onset. Irritative symptoms include frequency and urgency. Irritative symptoms do not include nocturia. Obstructive symptoms include dribbling, a slower stream and a weak stream. Obstructive symptoms do not include incomplete emptying. Pertinent negatives include no chills, dysuria, genital pain, hematuria, hesitancy, nausea or vomiting. Past treatments include tamsulosin (stopped last visit due to malaise.). The treatment provided moderate relief.  GI Problem The primary symptoms include melena. Primary symptoms do not include fever, weight loss, fatigue, abdominal pain, nausea, vomiting, diarrhea, hematemesis, jaundice, hematochezia, dysuria, myalgias, arthralgias or rash. Primary symptoms comment: episode of dark bm with fecal incontinence.one episode.  The illness does not include chills. Associated medical issues do not include alcohol abuse or diverticulitis.  Emesis  This is a recurrent problem. The current episode started more than 1 year ago. The problem has been waxing and waning. There has been no fever. Pertinent negatives include no abdominal pain, arthralgias, chest pain, chills, coughing, diarrhea, dizziness, fever, headaches, myalgias, sweats, URI or weight loss.    Lab Results  Component Value Date   CREATININE 2.01 (H) 01/01/2019   BUN 32 (H) 01/01/2019   NA 139 01/01/2019   K 4.9 01/01/2019   CL 101 01/01/2019   CO2 25 01/01/2019   Lab Results  Component Value Date   CHOL 158 02/06/2018   HDL  58 02/06/2018   LDLCALC 82 02/06/2018   TRIG 92 02/06/2018   CHOLHDL 2.3 11/29/2016   No results found for: TSH Lab Results  Component Value Date   HGBA1C 6.6 (H) 11/07/2018     Review of Systems  Constitutional: Negative for chills, fatigue, fever and weight loss.  Respiratory: Negative for cough.   Cardiovascular: Negative for chest pain.  Gastrointestinal: Positive for melena. Negative for abdominal pain, diarrhea, hematemesis, hematochezia, jaundice, nausea and vomiting.  Genitourinary: Positive for frequency and urgency. Negative for dysuria, hematuria, hesitancy, incomplete emptying and nocturia.  Musculoskeletal: Negative for arthralgias and myalgias.  Skin: Negative for rash.  Neurological: Negative for dizziness and headaches.    Patient Active Problem List   Diagnosis Date Noted  . Dizziness 04/12/2018  . TIA (transient ischemic attack) 01/25/2018  . Chronic systolic CHF (congestive heart failure), NYHA class 3 ([place]) 11/29/2017  . CKD (chronic kidney disease) stage 3, GFR 30-59 ml/min 08/18/2017  . Bradycardia 08/18/2017  . Atrial flutter, paroxysmal ([place]) 07/21/2017  . Functional dyspnea 07/06/2017  . Cardiomyopathy ([place]) 07/06/2017  . Hyperlipidemia, mixed 06/20/2017  . Hyperlipidemia associated with type 2 diabetes mellitus ([place]) 04/06/2016  . Type 2 diabetes mellitus without complications ([place]) 36/62/9476  . Personal history of other diseases of male genital organs 02/10/2015  . Sebaceous cyst 08/28/2014    No Known Allergies  Past Surgical History:  Procedure Laterality Date  . COLONOSCOPY N/A 02/26/2015   Procedure: COLONOSCOPY;  Surgeon: Hulen Luster, MD;  Location: [place];  Service: Gastroenterology;  Laterality: N/A;  . COLONOSCOPY WITH PROPOFOL N/A 11/02/2017   Procedure: COLONOSCOPY WITH PROPOFOL;  Surgeon: Jaconita, [place],  MD;  Location: [facility];  Service: Gastroenterology;  Laterality: N/A;  . ESOPHAGOGASTRODUODENOSCOPY N/A  02/26/2015   Procedure: ESOPHAGOGASTRODUODENOSCOPY (EGD);  Surgeon: Hulen Luster, MD;  Location: [place];  Service: Gastroenterology;  Laterality: N/A;  Diabetic - oral meds  . ESOPHAGOGASTRODUODENOSCOPY (EGD) WITH PROPOFOL N/A 11/02/2017   Procedure: ESOPHAGOGASTRODUODENOSCOPY (EGD) WITH PROPOFOL;  Surgeon: Toledo, Benay Pike, MD;  Location: [facility];  Service: Gastroenterology;  Laterality: N/A;  . KIDNEY SURGERY Right    surgery when 85 years old  . POLYPECTOMY  02/26/2015   Procedure: POLYPECTOMY;  Surgeon: Hulen Luster, MD;  Location: [place];  Service: Gastroenterology;;  . sebaceous cyst removal Right    located right of spine on upper back    Social History   Tobacco Use  . Smoking status: Former Smoker    Quit date: 03/08/1978    Years since quitting: 41.0  . Smokeless tobacco: Never Used  Substance Use Topics  . Alcohol use: Yes    Alcohol/week: 0.0 standard drinks    Comment: Rarely  . Drug use: No     Medication list has been reviewed and updated.  Current Meds  Medication Sig  . ACCU-CHEK SOFTCLIX LANCETS lancets TEST BLOOD SUGAR EVERY DAY  . Blood Glucose Monitoring Suppl (ACCU-CHEK AVIVA PLUS) w/Device KIT USE AS DIRECTED  . carvedilol (COREG) 3.125 MG tablet Take 1 tablet (3.125 mg total) by mouth 2 (two) times daily with a meal.  . diphenhydrAMINE (BENADRYL) 50 MG capsule Take 50 mg by mouth. qhs  . donepezil (ARICEPT) 10 MG tablet Take 1 tablet by mouth daily. Chipper Herb  . furosemide (LASIX) 20 MG tablet Take 1 tablet (20 mg total) by mouth daily. (Patient taking differently: Take 20 mg by mouth every morning. )  . losartan (COZAAR) 100 MG tablet Take 0.5 tablets (50 mg total) by mouth daily. (Patient taking differently: Take 50 mg by mouth daily before breakfast. )  . Melatonin 5 MG TABS Take 1 tablet by mouth at bedtime.  . metFORMIN (GLUCOPHAGE) 1000 MG tablet Take 1 tablet (1,000 mg total) by mouth 2 (two) times daily.  . Omega-3  Fatty Acids (FISH OIL) 1000 MG CAPS Take 1 capsule (1,000 mg total) by mouth daily.  Glory Rosebush ULTRA test strip USE TO TEST ONCE A DAY  . simvastatin (ZOCOR) 80 MG tablet Take 1 tablet (80 mg total) by mouth every evening. (Patient taking differently: Take 80 mg by mouth every morning. )  . [DISCONTINUED] ondansetron (ZOFRAN) 4 MG tablet Take 1 tablet (4 mg total) by mouth every 8 (eight) hours as needed for nausea or vomiting.    PHQ 2/9 Scores 05/09/2018 11/29/2016 08/07/2015 03/14/2015  PHQ - 2 Score 0 0 0 0  PHQ- 9 Score 0 0 - -    BP Readings from Last 3 Encounters:  03/12/19 130/70  02/20/19 118/60  01/01/19 (!) 158/81    Physical Exam  Wt Readings from Last 3 Encounters:  03/12/19 189 lb (85.7 kg)  02/20/19 188 lb (85.3 kg)  01/01/19 190 lb (86.2 kg)    BP 130/70   Pulse 72   Ht '5\' 11"'  (1.803 m)   Wt 189 lb (85.7 kg)   BMI 26.36 kg/m   Assessment and Plan: 1. Malaise and fatigue New onset.  Resolving.  Stable.  Patient is doing well since we stopped the tamsulosin.  Patient is doing more "piddaling "according to wife.  2. Incontinence of feces, unspecified fecal incontinence type Patient  had another episode of incontinence of feces that he said his foot was black.  Although the patient has appears to be taking some iron supplementation it was not related to the nurse.  We asked for him to check this in the meantime we will check his stool for blood.  3. Nausea and vomiting, intractability of vomiting not specified, unspecified vomiting type Patient with history of nausea and vomiting has done better since we stopped his tamsulosin.  4. Benign prostatic hyperplasia with lower urinary tract symptoms, symptom details unspecified Chronic.  Uncontrolled.  Relatively stable though patient has returned to symptoms of frequency nocturia and slow weak stream since discontinuance of tamsulosin.  We will refer to urology for evaluation we will continue to stop the tamsulosin and will  check a PSA today. - PSA - Ambulatory referral to Urology  5. History of anemia On check in patient patient does not appear pale however there is a history of decreased hemoglobin we will check his ferritin and iron stores as well as obtain a CBC to evaluate hemoglobin.  If this is low we will refer back to GI.  We will have patient do Hemoccults x3. - Fe+TIBC+Fer - CBC with Differential/Platelet  6. Hyperlipidemia associated with type 2 diabetes mellitus (HCC) Chronic.  Controlled.  Currently with diet.  We will check a lipid panel.  Patient is not interested in starting any new medications currently and will likely have to be controlled with diet if elevated. - Lipid Panel With LDL/HDL Ratio  7. Type 2 diabetes mellitus without complication, without long-term current use of insulin (HCC) Chronic.  Controlled.  Stable.  Continue metformin but we will switch over to an XR because of the unformed bowel movements.  Patient is aware that this may be more expensive but hopefully will be able to be in the area for affordance. - metFORMIN (GLUCOPHAGE-XR) 750 MG 24 hr tablet; Take 1 tablet (750 mg total) by mouth daily with breakfast.  Dispense: 90 tablet; Refill: 1

## 2019-03-13 LAB — LIPID PANEL WITH LDL/HDL RATIO
Cholesterol, Total: 257 mg/dL — ABNORMAL HIGH (ref 100–199)
HDL: 59 mg/dL (ref >39)
LDL Chol Calc (NIH): 167 mg/dL — ABNORMAL HIGH (ref 0–99)
LDL/HDL Ratio: 2.8 ratio (ref 0.0–3.6)
Triglycerides: 172 mg/dL — ABNORMAL HIGH (ref 0–149)
VLDL Cholesterol Cal: 31 mg/dL (ref 5–40)

## 2019-03-13 LAB — CBC WITH DIFFERENTIAL/PLATELET
Basophils Absolute: 0 10*3/uL (ref 0.0–0.2)
Basos: 1 %
EOS (ABSOLUTE): 0.1 10*3/uL (ref 0.0–0.4)
Eos: 2 %
Hematocrit: 35.4 % — ABNORMAL LOW (ref 37.5–51.0)
Hemoglobin: 12.8 g/dL — ABNORMAL LOW (ref 13.0–17.7)
Immature Grans (Abs): 0 10*3/uL (ref 0.0–0.1)
Immature Granulocytes: 0 %
Lymphocytes Absolute: 1.7 10*3/uL (ref 0.7–3.1)
Lymphs: 25 %
MCH: 35.6 pg — ABNORMAL HIGH (ref 26.6–33.0)
MCHC: 36.2 g/dL — ABNORMAL HIGH (ref 31.5–35.7)
MCV: 98 fL — ABNORMAL HIGH (ref 79–97)
Monocytes Absolute: 0.5 10*3/uL (ref 0.1–0.9)
Monocytes: 8 %
Neutrophils Absolute: 4.3 10*3/uL (ref 1.4–7.0)
Neutrophils: 64 %
Platelets: 157 10*3/uL (ref 150–450)
RBC: 3.6 x10E6/uL — ABNORMAL LOW (ref 4.14–5.80)
RDW: 13.1 % (ref 11.6–15.4)
WBC: 6.6 10*3/uL (ref 3.4–10.8)

## 2019-03-13 LAB — IRON,TIBC AND FERRITIN PANEL
Ferritin: 62 ng/mL (ref 30–400)
Iron Saturation: 25 % (ref 15–55)
Iron: 77 ug/dL (ref 38–169)
Total Iron Binding Capacity: 308 ug/dL (ref 250–450)
UIBC: 231 ug/dL (ref 111–343)

## 2019-03-13 LAB — PSA: Prostate Specific Ag, Serum: 0.5 ng/mL (ref 0.0–4.0)

## 2019-03-14 DIAGNOSIS — E782 Mixed hyperlipidemia: Secondary | ICD-10-CM | POA: Diagnosis not present

## 2019-03-14 DIAGNOSIS — I4892 Unspecified atrial flutter: Secondary | ICD-10-CM | POA: Diagnosis not present

## 2019-03-14 DIAGNOSIS — I420 Dilated cardiomyopathy: Secondary | ICD-10-CM | POA: Diagnosis not present

## 2019-03-14 DIAGNOSIS — I5022 Chronic systolic (congestive) heart failure: Secondary | ICD-10-CM | POA: Diagnosis not present

## 2019-03-16 LAB — SPECIMEN STATUS REPORT

## 2019-03-16 LAB — B12 AND FOLATE PANEL
Folate: 11.1 ng/mL (ref >3.0)
Vitamin B-12: 246 pg/mL (ref 232–1245)

## 2019-03-21 NOTE — Addendum Note (Signed)
Addended by: Clemetine Marker D on: 03/21/2019 12:10 PM   Modules accepted: Level of Service

## 2019-04-17 DIAGNOSIS — D6869 Other thrombophilia: Secondary | ICD-10-CM | POA: Diagnosis not present

## 2019-04-17 DIAGNOSIS — Z8249 Family history of ischemic heart disease and other diseases of the circulatory system: Secondary | ICD-10-CM | POA: Diagnosis not present

## 2019-04-17 DIAGNOSIS — R69 Illness, unspecified: Secondary | ICD-10-CM | POA: Diagnosis not present

## 2019-04-17 DIAGNOSIS — Z7984 Long term (current) use of oral hypoglycemic drugs: Secondary | ICD-10-CM | POA: Diagnosis not present

## 2019-04-17 DIAGNOSIS — M1990 Unspecified osteoarthritis, unspecified site: Secondary | ICD-10-CM | POA: Diagnosis not present

## 2019-04-17 DIAGNOSIS — I4891 Unspecified atrial fibrillation: Secondary | ICD-10-CM | POA: Diagnosis not present

## 2019-04-17 DIAGNOSIS — E785 Hyperlipidemia, unspecified: Secondary | ICD-10-CM | POA: Diagnosis not present

## 2019-04-17 DIAGNOSIS — E1165 Type 2 diabetes mellitus with hyperglycemia: Secondary | ICD-10-CM | POA: Diagnosis not present

## 2019-04-17 DIAGNOSIS — I10 Essential (primary) hypertension: Secondary | ICD-10-CM | POA: Diagnosis not present

## 2019-04-17 DIAGNOSIS — N529 Male erectile dysfunction, unspecified: Secondary | ICD-10-CM | POA: Diagnosis not present

## 2019-04-21 ENCOUNTER — Other Ambulatory Visit: Payer: Self-pay | Admitting: Family Medicine

## 2019-04-21 DIAGNOSIS — E119 Type 2 diabetes mellitus without complications: Secondary | ICD-10-CM

## 2019-04-23 DIAGNOSIS — R69 Illness, unspecified: Secondary | ICD-10-CM | POA: Diagnosis not present

## 2019-04-25 ENCOUNTER — Other Ambulatory Visit: Payer: Self-pay

## 2019-04-25 ENCOUNTER — Ambulatory Visit: Payer: Medicare HMO | Admitting: Urology

## 2019-04-25 ENCOUNTER — Encounter: Payer: Self-pay | Admitting: Urology

## 2019-04-25 VITALS — BP 147/78 | HR 59 | Ht 71.0 in | Wt 189.0 lb

## 2019-04-25 DIAGNOSIS — N401 Enlarged prostate with lower urinary tract symptoms: Secondary | ICD-10-CM

## 2019-04-25 LAB — BLADDER SCAN AMB NON-IMAGING: SCA Result: 0

## 2019-04-25 NOTE — Progress Notes (Signed)
04/25/2019 11:19 AM   Cody Howard 1934-05-07 [id number]  Referring provider: [name], MD [address] [place],  [place] [address]  Chief Complaint  Patient presents with  . Benign Prostatic Hypertrophy    HPI: 84y.o. male presents for follow-up of BPH.  He was seen January 2020 with lower urinary tract symptoms that had improved after being placed on tamsulosin by Dr. [name].  The tamsulosin was discontinued December 2020 due to possible side effects of fatigue, nausea and weakness.  These symptoms improved after stopping the medication though he noted mild increase in his voiding symptoms.  Urology follow-up was recommended.  He has noted some increased frequency since stopping the tamsulosin but states his voiding symptoms are not bothersome enough that he desires treatment.  Denies dysuria or gross hematuria.  No flank, abdominal or pelvic pain.     PMH: Past Medical History:  Diagnosis Date  . AF (atrial fibrillation) ([place])   . Arthritis    hands  . Chronic kidney disease   . Diabetes mellitus without complication ([place])   . Hyperlipidemia   . Hypertension   . Prostate enlargement     Surgical History: Past Surgical History:  Procedure Laterality Date  . COLONOSCOPY N/A 02/26/2015   Procedure: COLONOSCOPY;  Surgeon: Hulen Luster, MD;  Location: [place];  Service: Gastroenterology;  Laterality: N/A;  . COLONOSCOPY WITH PROPOFOL N/A 11/02/2017   Procedure: COLONOSCOPY WITH PROPOFOL;  Surgeon: Toledo, Benay Pike, MD;  Location: [facility];  Service: Gastroenterology;  Laterality: N/A;  . ESOPHAGOGASTRODUODENOSCOPY N/A 02/26/2015   Procedure: ESOPHAGOGASTRODUODENOSCOPY (EGD);  Surgeon: Hulen Luster, MD;  Location: [place];  Service: Gastroenterology;  Laterality: N/A;  Diabetic - oral meds  . ESOPHAGOGASTRODUODENOSCOPY (EGD) WITH PROPOFOL N/A 11/02/2017   Procedure: ESOPHAGOGASTRODUODENOSCOPY (EGD) WITH PROPOFOL;  Surgeon: Toledo,  Benay Pike, MD;  Location: [facility];  Service: Gastroenterology;  Laterality: N/A;  . KIDNEY SURGERY Right    surgery when 85 years old  . POLYPECTOMY  02/26/2015   Procedure: POLYPECTOMY;  Surgeon: Hulen Luster, MD;  Location: [facility] [place];  Service: Gastroenterology;;  . sebaceous cyst removal Right    located right of spine on upper back    Home Medications:  Allergies as of 04/25/2019   No Known Allergies     Medication List       Accurate as of April 25, 2019 11:19 AM. If you have any questions, ask your nurse or doctor.        Accu-Chek Aviva Plus w/Device Kit USE AS DIRECTED   Accu-Chek Softclix Lancets lancets TEST BLOOD SUGAR EVERY DAY   carvedilol 3.125 MG tablet Commonly known as: COREG Take 1 tablet (3.125 mg total) by mouth 2 (two) times daily with a meal.   diphenhydrAMINE 50 MG capsule Commonly known as: BENADRYL Take 50 mg by mouth. qhs   donepezil 10 MG tablet Commonly known as: ARICEPT Take 1 tablet by mouth daily. Chipper Herb   Fish Oil 1000 MG Caps Take 1 capsule (1,000 mg total) by mouth daily.   furosemide 20 MG tablet Commonly known as: LASIX Take 1 tablet (20 mg total) by mouth daily. What changed: when to take this   losartan 100 MG tablet Commonly known as: COZAAR Take 0.5 tablets (50 mg total) by mouth daily. What changed: when to take this   Melatonin 5 MG Tabs Take 1 tablet by mouth at bedtime.   metFORMIN 750 MG 24 hr tablet  Commonly known as: GLUCOPHAGE-XR Take 1 tablet (750 mg total) by mouth daily with breakfast.   OneTouch Ultra test strip Generic drug: glucose blood USE TO TEST ONCE A DAY   simvastatin 80 MG tablet Commonly known as: ZOCOR Take 1 tablet (80 mg total) by mouth every evening. What changed: when to take this       Allergies: No Known Allergies  Family History: Family History  Problem Relation Age of Onset  . Diabetes Father   . Heart disease Father     Social History:  reports  that he quit smoking about 41 years ago. He has never used smokeless tobacco. He reports current alcohol use. He reports that he does not use drugs.   Physical Exam: BP (!) 147/78 (BP Location: Left Arm, Patient Position: Sitting, Cuff Size: Large)   Pulse (!) 59   Ht '5\' 11"'  (1.803 m)   Wt 189 lb (85.7 kg)   BMI 26.36 kg/m   Constitutional:  Alert and oriented, No acute distress. HEENT: [place] AT, moist mucus membranes.  Trachea midline, no masses. Cardiovascular: No clubbing, cyanosis, or edema. Respiratory: Normal respiratory effort, no increased work of breathing. Skin: No rashes, bruises or suspicious lesions. Neurologic: Grossly intact, no focal deficits, moving all 4 extremities. Psychiatric: Normal mood   Assessment & Plan:    - BPH with lower urinary tract symptoms PVR by bladder scan today was 0 mL.  His present voiding symptoms are not bothersome.  If his symptoms do worsen would give a trial of a prostate specific alpha-blocker which may have less side effects-silodosin 8 mg daily.  He has elected prn follow-up    Abbie Sons, MD  [facility] [address] [place], [place] [address] [phone]

## 2019-04-27 ENCOUNTER — Encounter: Payer: Self-pay | Admitting: Urology

## 2019-04-27 DIAGNOSIS — L821 Other seborrheic keratosis: Secondary | ICD-10-CM | POA: Diagnosis not present

## 2019-04-27 DIAGNOSIS — Z08 Encounter for follow-up examination after completed treatment for malignant neoplasm: Secondary | ICD-10-CM | POA: Diagnosis not present

## 2019-04-27 DIAGNOSIS — D1801 Hemangioma of skin and subcutaneous tissue: Secondary | ICD-10-CM | POA: Diagnosis not present

## 2019-04-27 DIAGNOSIS — Z7189 Other specified counseling: Secondary | ICD-10-CM | POA: Diagnosis not present

## 2019-04-27 DIAGNOSIS — D2372 Other benign neoplasm of skin of left lower limb, including hip: Secondary | ICD-10-CM | POA: Diagnosis not present

## 2019-04-27 DIAGNOSIS — L570 Actinic keratosis: Secondary | ICD-10-CM | POA: Diagnosis not present

## 2019-04-27 DIAGNOSIS — L538 Other specified erythematous conditions: Secondary | ICD-10-CM | POA: Diagnosis not present

## 2019-04-27 DIAGNOSIS — Z85828 Personal history of other malignant neoplasm of skin: Secondary | ICD-10-CM | POA: Diagnosis not present

## 2019-04-27 DIAGNOSIS — D485 Neoplasm of uncertain behavior of skin: Secondary | ICD-10-CM | POA: Diagnosis not present

## 2019-04-27 DIAGNOSIS — L814 Other melanin hyperpigmentation: Secondary | ICD-10-CM | POA: Diagnosis not present

## 2019-05-28 ENCOUNTER — Telehealth: Payer: Self-pay | Admitting: Family Medicine

## 2019-05-28 NOTE — Telephone Encounter (Signed)
Left message for patient to call back and schedule Medicare Annual Wellness Visit (AWV) either virtually/audio only or in office. Whichever the patients preference is.  Last AWV 05/10/18; please schedule at anytime with [facility] Health Advisor.

## 2019-05-31 ENCOUNTER — Other Ambulatory Visit: Payer: Self-pay

## 2019-05-31 ENCOUNTER — Ambulatory Visit (INDEPENDENT_AMBULATORY_CARE_PROVIDER_SITE_OTHER): Payer: Medicare HMO | Admitting: Family Medicine

## 2019-05-31 ENCOUNTER — Encounter: Payer: Self-pay | Admitting: Family Medicine

## 2019-05-31 VITALS — BP 120/60 | HR 70 | Ht 71.0 in | Wt 189.0 lb

## 2019-05-31 DIAGNOSIS — N401 Enlarged prostate with lower urinary tract symptoms: Secondary | ICD-10-CM | POA: Diagnosis not present

## 2019-05-31 DIAGNOSIS — Z862 Personal history of diseases of the blood and blood-forming organs and certain disorders involving the immune mechanism: Secondary | ICD-10-CM | POA: Diagnosis not present

## 2019-05-31 DIAGNOSIS — K449 Diaphragmatic hernia without obstruction or gangrene: Secondary | ICD-10-CM | POA: Diagnosis not present

## 2019-05-31 DIAGNOSIS — R5383 Other fatigue: Secondary | ICD-10-CM

## 2019-05-31 DIAGNOSIS — N1832 Chronic kidney disease, stage 3b: Secondary | ICD-10-CM | POA: Diagnosis not present

## 2019-05-31 DIAGNOSIS — R5381 Other malaise: Secondary | ICD-10-CM | POA: Diagnosis not present

## 2019-05-31 DIAGNOSIS — D649 Anemia, unspecified: Secondary | ICD-10-CM | POA: Diagnosis not present

## 2019-05-31 DIAGNOSIS — G319 Degenerative disease of nervous system, unspecified: Secondary | ICD-10-CM | POA: Diagnosis not present

## 2019-05-31 DIAGNOSIS — I429 Cardiomyopathy, unspecified: Secondary | ICD-10-CM

## 2019-05-31 DIAGNOSIS — R69 Illness, unspecified: Secondary | ICD-10-CM | POA: Diagnosis not present

## 2019-05-31 DIAGNOSIS — I10 Essential (primary) hypertension: Secondary | ICD-10-CM | POA: Diagnosis not present

## 2019-05-31 DIAGNOSIS — F329 Major depressive disorder, single episode, unspecified: Secondary | ICD-10-CM

## 2019-05-31 MED ORDER — SERTRALINE HCL 25 MG PO TABS: 25.0000 mg | ORAL_TABLET | Freq: Every day | ORAL | 1 refills | Status: DC

## 2019-05-31 NOTE — Progress Notes (Signed)
Date:  05/31/2019   Name:  Cody Howard   DOB:  1934-12-27   MRN:  [id number]   Chief Complaint: Chills (getting hot and cold ), Fatigue, and belching (belching and hiccupping x 6 months)  Thyroid Problem    Lab Results  Component Value Date   CREATININE 2.01 (H) 01/01/2019   BUN 32 (H) 01/01/2019   NA 139 01/01/2019   K 4.9 01/01/2019   CL 101 01/01/2019   CO2 25 01/01/2019   Lab Results  Component Value Date   CHOL 257 (H) 03/12/2019   HDL 59 03/12/2019   LDLCALC 167 (H) 03/12/2019   TRIG 172 (H) 03/12/2019   CHOLHDL 2.3 11/29/2016   No results found for: TSH Lab Results  Component Value Date   HGBA1C 6.6 (H) 11/07/2018   Lab Results  Component Value Date   WBC 6.6 03/12/2019   HGB 12.8 (L) 03/12/2019   HCT 35.4 (L) 03/12/2019   MCV 98 (H) 03/12/2019   PLT 157 03/12/2019   Lab Results  Component Value Date   ALT 15 01/01/2019   AST 21 01/01/2019   ALKPHOS 82 01/01/2019   BILITOT 0.7 01/01/2019     Review of Systems  Patient Active Problem List   Diagnosis Date Noted  . Dizziness 04/12/2018  . TIA (transient ischemic attack) 01/25/2018  . Chronic systolic CHF (congestive heart failure), NYHA class 3 ([place]) 11/29/2017  . CKD (chronic kidney disease) stage 3, GFR 30-59 ml/min 08/18/2017  . Bradycardia 08/18/2017  . Atrial flutter, paroxysmal ([place]) 07/21/2017  . Functional dyspnea 07/06/2017  . Cardiomyopathy ([place]) 07/06/2017  . Hyperlipidemia, mixed 06/20/2017  . Hyperlipidemia associated with type 2 diabetes mellitus ([place]) 04/06/2016  . Type 2 diabetes mellitus without complications ([place]) 69/79/4801  . Personal history of other diseases of male genital organs 02/10/2015  . Sebaceous cyst 08/28/2014    No Known Allergies  Past Surgical History:  Procedure Laterality Date  . COLONOSCOPY N/A 02/26/2015   Procedure: COLONOSCOPY;  Surgeon: Hulen Luster, MD;  Location: [place];  Service: Gastroenterology;  Laterality: N/A;  .  COLONOSCOPY WITH PROPOFOL N/A 11/02/2017   Procedure: COLONOSCOPY WITH PROPOFOL;  Surgeon: Toledo, Benay Pike, MD;  Location: [facility];  Service: Gastroenterology;  Laterality: N/A;  . ESOPHAGOGASTRODUODENOSCOPY N/A 02/26/2015   Procedure: ESOPHAGOGASTRODUODENOSCOPY (EGD);  Surgeon: Hulen Luster, MD;  Location: [facility];  Service: Gastroenterology;  Laterality: N/A;  Diabetic - oral meds  . ESOPHAGOGASTRODUODENOSCOPY (EGD) WITH PROPOFOL N/A 11/02/2017   Procedure: ESOPHAGOGASTRODUODENOSCOPY (EGD) WITH PROPOFOL;  Surgeon: Toledo, Benay Pike, MD;  Location: [facility];  Service: Gastroenterology;  Laterality: N/A;  . KIDNEY SURGERY Right    surgery when 85 years old  . POLYPECTOMY  02/26/2015   Procedure: POLYPECTOMY;  Surgeon: Hulen Luster, MD;  Location: [place];  Service: Gastroenterology;;  . sebaceous cyst removal Right    located right of spine on upper back    Social History   Tobacco Use  . Smoking status: Former Smoker    Quit date: 03/08/1978    Years since quitting: 41.2  . Smokeless tobacco: Never Used  Substance Use Topics  . Alcohol use: Yes    Alcohol/week: 0.0 standard drinks    Comment: Rarely  . Drug use: No     Medication list has been reviewed and updated.  Current Meds  Medication Sig  . ACCU-CHEK SOFTCLIX LANCETS lancets TEST BLOOD SUGAR EVERY DAY  . Blood Glucose Monitoring Suppl (  ACCU-CHEK AVIVA PLUS) w/Device KIT USE AS DIRECTED  . carvedilol (COREG) 3.125 MG tablet Take 1 tablet (3.125 mg total) by mouth 2 (two) times daily with a meal.  . diphenhydrAMINE (BENADRYL) 50 MG capsule Take 50 mg by mouth. qhs  . donepezil (ARICEPT) 10 MG tablet Take 1 tablet by mouth daily. Chipper Herb  . furosemide (LASIX) 20 MG tablet Take 1 tablet (20 mg total) by mouth daily. (Patient taking differently: Take 20 mg by mouth every morning. )  . losartan (COZAAR) 100 MG tablet Take 0.5 tablets (50 mg total) by mouth daily. (Patient taking differently:  Take 50 mg by mouth daily before breakfast. )  . Melatonin 5 MG TABS Take 1 tablet by mouth at bedtime.  . metFORMIN (GLUCOPHAGE-XR) 750 MG 24 hr tablet Take 1 tablet (750 mg total) by mouth daily with breakfast.  . Omega-3 Fatty Acids (FISH OIL) 1000 MG CAPS Take 1 capsule (1,000 mg total) by mouth daily.  Glory Rosebush ULTRA test strip USE TO TEST ONCE A DAY  . simvastatin (ZOCOR) 80 MG tablet Take 1 tablet (80 mg total) by mouth every evening. (Patient taking differently: Take 80 mg by mouth every morning. )    PHQ 2/9 Scores 05/31/2019 05/09/2018 11/29/2016 08/07/2015  PHQ - 2 Score 2 0 0 0  PHQ- 9 Score 4 0 0 -    BP Readings from Last 3 Encounters:  05/31/19 120/60  04/25/19 (!) 147/78  03/12/19 130/70    Physical Exam  Wt Readings from Last 3 Encounters:  05/31/19 189 lb (85.7 kg)  04/25/19 189 lb (85.7 kg)  03/12/19 189 lb (85.7 kg)    BP 120/60   Pulse 70   Ht '5\' 11"'  (1.803 m)   Wt 189 lb (85.7 kg)   BMI 26.36 kg/m   Assessment and Plan: 1. Malaise and fatigue Patient returns today for the ongoing concern of malaise and fatigue.  Patient's previous encounters, most recent labs, most recent imaging, and care elsewhere were reviewed.  We will obtain a CBC, renal function panel and TSH for evaluation today. - CBC - Renal Function Panel - TSH  2. Cardiomyopathy, unspecified type [facility]) It was discussed with patient that he has a dilated cardiomyopathy that is followed by Dr. Jose Persia with initial echo noting that his otic output was in the 20% range this was discussed with the patient that this could likely contribute to his malaise and fatigue even though with medications this is been improved to 40% to 45%.  Patient notes mostly with activity that he cannot do like he used to and this may be the limiting factor with this.  Patient's been encouraged to return to Dr. Jose Persia if this should continue.  3. History of anemia Patient has a history of anemia.  This is minimal at 2/10  of a reading below low normal hemoglobin.  Will repeat CBC with indices that were elevated in the past and patient has been encouraged to continue folic acid and N17 on an OTC basis - CBC  4. Stage 3b chronic kidney disease Patient's GFR has been noted to be at 30 the last 2 evaluations and he is currently seeing Dr. Holley Raring.  This also may be contributing to the patient's malaise and overall decreased wellbeing.  Patient's been encouraged to continue follow-up with Dr. Holley Raring and maintenance and surveillance of his renal function is to continue.  We will check renal panel to note his current GFR and creatinine/BUN.  5. Benign prostatic  hyperplasia with lower urinary tract symptoms, symptom details unspecified Patient with a history of BPH which is followed by Dr. Bernardo Heater.  Patient has been put back on tamsulosin but he has been unable to take this in the past due to fatigue but when I mentioned that he has been fatigued off the medication that he may want to resume this to see if this makes symptoms any worse.  In the meantime his next step will be to return to Dr. Bernardo Heater for further treatment.  6. Cerebral atrophy, mild ([place]) And is followed by neurology for cerebral atrophy of a microvascular and possible Alzheimer-like nature.  He is currently on Aricept for memory loss and this too can be frustrating can contribute to his decreased wellbeing.  7. Reactive depression Reevaluation patient was noted to have a PHQ of 4.  Is been pointed out to patient that there may be an element of depression and we will initiate sertraline 12.5 mg to taper to 25 mg to see if there is any improvement of the disposition.  8. Essential hypertension Patient with history of essential hypertension followed by cardiology will check renal function panel.  Currently blood pressure is normal at 120/60. - Renal Function Panel  9. Hiatal hernia Patient also can planes of belching and reflux and he is followed by  gastroenterology and it was noted that patient does have a hiatal hernia and this was explained to him that this may be the nature of his symptoms.

## 2019-06-01 LAB — TSH: TSH: 2.71 u[IU]/mL (ref 0.450–4.500)

## 2019-06-01 LAB — RENAL FUNCTION PANEL
Albumin: 4.2 g/dL (ref 3.6–4.6)
BUN/Creatinine Ratio: 13 (ref 10–24)
BUN: 25 mg/dL (ref 8–27)
CO2: 23 mmol/L (ref 20–29)
Calcium: 9 mg/dL (ref 8.6–10.2)
Chloride: 104 mmol/L (ref 96–106)
Creatinine, Ser: 2 mg/dL — ABNORMAL HIGH (ref 0.76–1.27)
GFR calc Af Amer: 35 mL/min/{1.73_m2} — ABNORMAL LOW (ref >59)
GFR calc non Af Amer: 30 mL/min/{1.73_m2} — ABNORMAL LOW (ref >59)
Glucose: 178 mg/dL — ABNORMAL HIGH (ref 65–99)
Phosphorus: 3.4 mg/dL (ref 2.8–4.1)
Potassium: 5.2 mmol/L (ref 3.5–5.2)
Sodium: 139 mmol/L (ref 134–144)

## 2019-06-01 LAB — CBC
Hematocrit: 33.1 % — ABNORMAL LOW (ref 37.5–51.0)
Hemoglobin: 12.4 g/dL — ABNORMAL LOW (ref 13.0–17.7)
MCH: 37 pg — ABNORMAL HIGH (ref 26.6–33.0)
MCHC: 37.5 g/dL — ABNORMAL HIGH (ref 31.5–35.7)
MCV: 99 fL — ABNORMAL HIGH (ref 79–97)
Platelets: 161 10*3/uL (ref 150–450)
RBC: 3.35 x10E6/uL — ABNORMAL LOW (ref 4.14–5.80)
RDW: 12 % (ref 11.6–15.4)
WBC: 7.8 10*3/uL (ref 3.4–10.8)

## 2019-06-05 LAB — FERRITIN: Ferritin: 64 ng/mL (ref 30–400)

## 2019-06-05 LAB — SPECIMEN STATUS REPORT

## 2019-06-11 DIAGNOSIS — E1122 Type 2 diabetes mellitus with diabetic chronic kidney disease: Secondary | ICD-10-CM | POA: Diagnosis not present

## 2019-06-11 DIAGNOSIS — D631 Anemia in chronic kidney disease: Secondary | ICD-10-CM | POA: Diagnosis not present

## 2019-06-11 DIAGNOSIS — N2581 Secondary hyperparathyroidism of renal origin: Secondary | ICD-10-CM | POA: Diagnosis not present

## 2019-06-11 DIAGNOSIS — N1832 Chronic kidney disease, stage 3b: Secondary | ICD-10-CM | POA: Diagnosis not present

## 2019-06-11 DIAGNOSIS — I10 Essential (primary) hypertension: Secondary | ICD-10-CM | POA: Diagnosis not present

## 2019-06-12 ENCOUNTER — Other Ambulatory Visit: Payer: Self-pay

## 2019-06-12 DIAGNOSIS — R4189 Other symptoms and signs involving cognitive functions and awareness: Secondary | ICD-10-CM

## 2019-06-12 MED ORDER — DONEPEZIL HCL 10 MG PO TABS: 10.0000 mg | ORAL_TABLET | Freq: Every day | ORAL | 0 refills | Status: DC

## 2019-06-12 NOTE — Progress Notes (Signed)
Sent in aricept 30 days- explained to pt and wife they will need to follow up with neuro.

## 2019-06-16 ENCOUNTER — Other Ambulatory Visit: Payer: Self-pay

## 2019-06-16 ENCOUNTER — Ambulatory Visit
Admission: EM | Admit: 2019-06-16 | Discharge: 2019-06-16 | Disposition: A | Discharge status: Home / Self Care | Payer: Medicare HMO | Attending: Orthopedic Surgery | Admitting: Orthopedic Surgery

## 2019-06-16 DIAGNOSIS — S61411A Laceration without foreign body of right hand, initial encounter: Secondary | ICD-10-CM | POA: Diagnosis not present

## 2019-06-16 MED ORDER — TETANUS-DIPHTH-ACELL PERTUSSIS 5-2.5-18.5 LF-MCG/0.5 IM SUSP
0.5000 mL | Freq: Once | INTRAMUSCULAR | Status: AC
  Administered 2019-06-16: 16:00:00 0.5 mL via INTRAMUSCULAR

## 2019-06-16 NOTE — ED Triage Notes (Signed)
Pt presents with laceration to right palm. Pt states he was assembling a table and a piece slipped and cut his hand, he states it was a metal piece. Pt last tetanus was 2016.

## 2019-06-16 NOTE — ED Provider Notes (Signed)
[facility]    CSN: [id number] Arrival date & time: 06/16/19  1451      History   Chief Complaint Chief Complaint  Patient presents with  . Extremity Laceration    right palm, 06/16/19    HPI GALO SAYED is a 85 y.o. male.   Presents to urgent care for evaluation of right palm laceration.  He suffered a laceration along the hyperthenar eminence of the right palm.  No numbness or tingling.  Tetanus not up-to-date.  Injury occurred 1 hour prior to arrival.  He is right-hand dominant.  Patient was hitting a shelf as he was building a and suffered a laceration to his palm.  Pain is mild.  HPI  Past Medical History:  Diagnosis Date  . AF (atrial fibrillation) ([place])   . Arthritis    hands  . Chronic kidney disease   . Diabetes mellitus without complication ([place])   . Hyperlipidemia   . Hypertension   . Prostate enlargement     Patient Active Problem List   Diagnosis Date Noted  . History of anemia 05/31/2019  . Benign prostatic hyperplasia with lower urinary tract symptoms 05/31/2019  . Cerebral atrophy, mild ([place]) 05/31/2019  . Reactive depression 05/31/2019  . Essential hypertension 05/31/2019  . Hiatal hernia 05/31/2019  . Dizziness 04/12/2018  . TIA (transient ischemic attack) 01/25/2018  . Chronic systolic CHF (congestive heart failure), NYHA class 3 ([place]) 11/29/2017  . CKD (chronic kidney disease) stage 3, GFR 30-59 ml/min 08/18/2017  . Bradycardia 08/18/2017  . Atrial flutter, paroxysmal ([place]) 07/21/2017  . Functional dyspnea 07/06/2017  . Cardiomyopathy ([place]) 07/06/2017  . Hyperlipidemia, mixed 06/20/2017  . Hyperlipidemia associated with type 2 diabetes mellitus ([place]) 04/06/2016  . Type 2 diabetes mellitus without complications ([place]) 55/73/2202  . Personal history of other diseases of male genital organs 02/10/2015  . Sebaceous cyst 08/28/2014    Past Surgical History:  Procedure Laterality Date  . COLONOSCOPY N/A 02/26/2015   Procedure:  COLONOSCOPY;  Surgeon: Hulen Luster, MD;  Location: [place];  Service: Gastroenterology;  Laterality: N/A;  . COLONOSCOPY WITH PROPOFOL N/A 11/02/2017   Procedure: COLONOSCOPY WITH PROPOFOL;  Surgeon: Toledo, Benay Pike, MD;  Location: [facility];  Service: Gastroenterology;  Laterality: N/A;  . ESOPHAGOGASTRODUODENOSCOPY N/A 02/26/2015   Procedure: ESOPHAGOGASTRODUODENOSCOPY (EGD);  Surgeon: Hulen Luster, MD;  Location: [facility];  Service: Gastroenterology;  Laterality: N/A;  Diabetic - oral meds  . ESOPHAGOGASTRODUODENOSCOPY (EGD) WITH PROPOFOL N/A 11/02/2017   Procedure: ESOPHAGOGASTRODUODENOSCOPY (EGD) WITH PROPOFOL;  Surgeon: Toledo, Benay Pike, MD;  Location: [facility];  Service: Gastroenterology;  Laterality: N/A;  . KIDNEY SURGERY Right    surgery when 85 years old  . POLYPECTOMY  02/26/2015   Procedure: POLYPECTOMY;  Surgeon: Hulen Luster, MD;  Location: [facility] [place];  Service: Gastroenterology;;  . sebaceous cyst removal Right    located right of spine on upper back       Home Medications    Prior to Admission medications   Medication Sig Start Date End Date Taking? Authorizing Provider  ACCU-CHEK SOFTCLIX LANCETS lancets TEST BLOOD SUGAR EVERY DAY 09/19/15  Yes [name], MD  Blood Glucose Monitoring Suppl (ACCU-CHEK AVIVA PLUS) w/Device KIT USE AS DIRECTED 03/11/15  Yes [name], MD  carvedilol (COREG) 3.125 MG tablet Take 1 tablet (3.125 mg total) by mouth 2 (two) times daily with a meal. 11/07/18  Yes [name], MD  diphenhydrAMINE (BENADRYL) 50 MG  capsule Take 50 mg by mouth. qhs   Yes [provider]  donepezil (ARICEPT) 10 MG tablet Take 1 tablet (10 mg total) by mouth daily. Chipper Herb 06/12/19  Yes [name], MD  furosemide (LASIX) 20 MG tablet Take 1 tablet (20 mg total) by mouth daily. Patient taking differently: Take 20 mg by mouth every morning.  11/07/18  Yes [name], MD  losartan (COZAAR) 100 MG tablet  Take 0.5 tablets (50 mg total) by mouth daily. Patient taking differently: Take 50 mg by mouth daily before breakfast.  11/07/18  Yes [name], MD  Melatonin 5 MG TABS Take 1 tablet by mouth at bedtime.   Yes [provider]  metFORMIN (GLUCOPHAGE-XR) 750 MG 24 hr tablet Take 1 tablet (750 mg total) by mouth daily with breakfast. 03/12/19  Yes [name], MD  Omega-3 Fatty Acids (FISH OIL) 1000 MG CAPS Take 1 capsule (1,000 mg total) by mouth daily. 02/06/18  Yes [name], MD  [facility] ULTRA test strip USE TO TEST ONCE A DAY 04/23/19  Yes [name], MD  sertraline (ZOLOFT) 25 MG tablet Take 1 tablet (25 mg total) by mouth daily. Start with one half a day for 2 weeks 05/31/19  Yes [name], MD  simvastatin (ZOCOR) 80 MG tablet Take 1 tablet (80 mg total) by mouth every evening. Patient taking differently: Take 80 mg by mouth every morning.  11/07/18  Yes [name], MD    Family History Family History  Problem Relation Age of Onset  . Diabetes Father   . Heart disease Father     Social History Social History   Tobacco Use  . Smoking status: Former Smoker    Quit date: 03/08/1978    Years since quitting: 41.3  . Smokeless tobacco: Never Used  Substance Use Topics  . Alcohol use: Yes    Alcohol/week: 0.0 standard drinks    Comment: Rarely  . Drug use: No     Allergies   Patient has no known allergies.   Review of Systems Review of Systems  Musculoskeletal: Negative for arthralgias, joint swelling and myalgias.  Skin: Positive for wound.  Neurological: Negative for numbness.     Physical Exam Triage Vital Signs ED Triage Vitals  Enc Vitals Group     BP 06/16/19 1501 120/73     Pulse Rate 06/16/19 1501 64     Resp 06/16/19 1501 18     Temp 06/16/19 1501 98.1 F (36.7 C)     Temp Source 06/16/19 1501 Oral     SpO2 06/16/19 1501 100 %     Weight 06/16/19 1500 190 lb (86.2 kg)     Height 06/16/19 1500 5' 11.5" (1.816 m)     Head  Circumference --      Peak Flow --      Pain Score 06/16/19 1500 0     Pain Loc --      Pain Edu? --      Excl. in [place]? --    No data found.  Updated Vital Signs BP 120/73 (BP Location: Left Arm)   Pulse 64   Temp 98.1 F (36.7 C) (Oral)   Resp 18   Ht 5' 11.5" (1.816 m)   Wt 190 lb (86.2 kg)   SpO2 100%   BMI 26.13 kg/m   Visual Acuity Right Eye Distance:   Left Eye Distance:   Bilateral Distance:    Right Eye  Near:   Left Eye Near:    Bilateral Near:     Physical Exam Constitutional:      Appearance: He is well-developed.  HENT:     Head: Normocephalic and atraumatic.  Eyes:     Conjunctiva/sclera: Conjunctivae normal.  Cardiovascular:     Rate and Rhythm: Normal rate.  Pulmonary:     Effort: Pulmonary effort is normal. No respiratory distress.  Musculoskeletal:        General: Normal range of motion.     Cervical back: Normal range of motion.     Comments: Examination of the right hand shows hypothenar eminence with 3 cm linear laceration, slightly gaping with 2 mm of depth.  No active bleeding.  No visible or palpable foreign body.  No tendon deficits noted.  Able to make a full fist.  Active bleeding.  No signs of infection.  Skin:    General: Skin is warm.     Findings: No rash.  Neurological:     Mental Status: He is alert and oriented to person, place, and time.  Psychiatric:        Behavior: Behavior normal.        Thought Content: Thought content normal.      UC Treatments / Results  Labs (all labs ordered are listed, but only abnormal results are displayed) Labs Reviewed - No data to display  EKG   Radiology No results found.  Procedures Laceration Repair  Date/Time: 06/16/2019 4:04 PM Performed by: Duanne Guess, PA-C Authorized by: Duanne Guess, PA-C   Consent:    Consent obtained:  Verbal   Consent given by:  Patient   Risks discussed:  Infection, pain, retained foreign body, poor cosmetic result and poor wound healing  Anesthesia (see MAR for exact dosages):    Anesthesia method:  Local infiltration   Local anesthetic:  Lidocaine 1% WITH epi Laceration details:    Location: Right palm.   Length (cm):  3   Depth (mm):  2 Repair type:    Repair type:  Simple Exploration:    Hemostasis achieved with:  Direct pressure   Wound exploration: entire depth of wound probed and visualized     Contaminated: no   Treatment:    Area cleansed with:  Saline and Betadine   Amount of cleaning:  Extensive   Irrigation solution:  Sterile saline   Irrigation method:  Pressure wash   Visualized foreign bodies/material removed: no   Skin repair:    Repair method:  Sutures   Suture size:  5-0   Suture material:  Nylon   Suture technique:  Simple interrupted   Number of sutures:  5 Approximation:    Approximation:  Close Post-procedure details:    Dressing:  Sterile dressing and adhesive bandage   Patient tolerance of procedure:  Tolerated well, no immediate complications   (including critical care time)  Medications Ordered in UC Medications  Tdap (BOOSTRIX) injection 0.5 mL (has no administration in time range)    Initial Impression / Assessment and Plan / UC Course  I have reviewed the triage vital signs and the nursing notes.  Pertinent labs & imaging results that were available during my care of the patient were reviewed by me and considered in my medical decision making (see chart for details).     85 year old male with laceration of the right hand.  Laceration thoroughly irrigated and repaired with number five 5-0 nylon sutures.  He will follow-up in 10 days for  suture removal.  He is educated on wound care.  Understands signs symptoms return to clinic for.  Tetanus updated today. Final Clinical Impressions(s) / UC Diagnoses   Final diagnoses:  Laceration of right hand, foreign body presence unspecified, initial encounter     Discharge Instructions     Please keep laceration site clean.  You  may shower but do not submerge in water.  Closed stable proximal lateral call.  Keep covered during the day.  Follow-up in 10 days for suture removal.   ED Prescriptions    None     PDMP not reviewed this encounter.   Duanne Guess, [place] 06/16/19 1605

## 2019-06-16 NOTE — Discharge Instructions (Addendum)
Please keep laceration site clean.  You may shower but do not submerge in water.  Closed stable proximal lateral call.  Keep covered during the day.  Follow-up in 10 days for suture removal.

## 2019-06-21 DIAGNOSIS — R413 Other amnesia: Secondary | ICD-10-CM | POA: Diagnosis not present

## 2019-06-25 DIAGNOSIS — H00024 Hordeolum internum left upper eyelid: Secondary | ICD-10-CM | POA: Diagnosis not present

## 2019-06-25 DIAGNOSIS — H01025 Squamous blepharitis left lower eyelid: Secondary | ICD-10-CM | POA: Diagnosis not present

## 2019-06-25 DIAGNOSIS — Z961 Presence of intraocular lens: Secondary | ICD-10-CM | POA: Diagnosis not present

## 2019-06-25 DIAGNOSIS — H01022 Squamous blepharitis right lower eyelid: Secondary | ICD-10-CM | POA: Diagnosis not present

## 2019-06-27 ENCOUNTER — Emergency Department: Payer: Medicare HMO

## 2019-06-27 ENCOUNTER — Other Ambulatory Visit: Payer: Self-pay

## 2019-06-27 ENCOUNTER — Inpatient Hospital Stay
Admission: EM | Admit: 2019-06-27 | Discharge: 2019-07-05 | DRG: 522 | Disposition: A | Discharge status: Skilled Nursing Facility | Payer: Medicare HMO | Attending: Internal Medicine | Admitting: Internal Medicine

## 2019-06-27 DIAGNOSIS — Z87891 Personal history of nicotine dependence: Secondary | ICD-10-CM

## 2019-06-27 DIAGNOSIS — D631 Anemia in chronic kidney disease: Secondary | ICD-10-CM | POA: Diagnosis present

## 2019-06-27 DIAGNOSIS — I429 Cardiomyopathy, unspecified: Secondary | ICD-10-CM | POA: Diagnosis present

## 2019-06-27 DIAGNOSIS — Y9301 Activity, walking, marching and hiking: Secondary | ICD-10-CM | POA: Diagnosis present

## 2019-06-27 DIAGNOSIS — F32A Depression, unspecified: Secondary | ICD-10-CM | POA: Diagnosis present

## 2019-06-27 DIAGNOSIS — E1129 Type 2 diabetes mellitus with other diabetic kidney complication: Secondary | ICD-10-CM | POA: Diagnosis present

## 2019-06-27 DIAGNOSIS — Z8673 Personal history of transient ischemic attack (TIA), and cerebral infarction without residual deficits: Secondary | ICD-10-CM

## 2019-06-27 DIAGNOSIS — E782 Mixed hyperlipidemia: Secondary | ICD-10-CM | POA: Diagnosis present

## 2019-06-27 DIAGNOSIS — S72002A Fracture of unspecified part of neck of left femur, initial encounter for closed fracture: Principal | ICD-10-CM | POA: Diagnosis present

## 2019-06-27 DIAGNOSIS — R262 Difficulty in walking, not elsewhere classified: Secondary | ICD-10-CM | POA: Diagnosis not present

## 2019-06-27 DIAGNOSIS — N183 Chronic kidney disease, stage 3 unspecified: Secondary | ICD-10-CM | POA: Diagnosis not present

## 2019-06-27 DIAGNOSIS — Z8249 Family history of ischemic heart disease and other diseases of the circulatory system: Secondary | ICD-10-CM | POA: Diagnosis not present

## 2019-06-27 DIAGNOSIS — S72092A Other fracture of head and neck of left femur, initial encounter for closed fracture: Secondary | ICD-10-CM | POA: Diagnosis not present

## 2019-06-27 DIAGNOSIS — Y92009 Unspecified place in unspecified non-institutional (private) residence as the place of occurrence of the external cause: Secondary | ICD-10-CM | POA: Diagnosis not present

## 2019-06-27 DIAGNOSIS — Y92017 Garden or yard in single-family (private) house as the place of occurrence of the external cause: Secondary | ICD-10-CM | POA: Diagnosis not present

## 2019-06-27 DIAGNOSIS — Z03818 Encounter for observation for suspected exposure to other biological agents ruled out: Secondary | ICD-10-CM | POA: Diagnosis not present

## 2019-06-27 DIAGNOSIS — I482 Chronic atrial fibrillation, unspecified: Secondary | ICD-10-CM | POA: Diagnosis present

## 2019-06-27 DIAGNOSIS — I4892 Unspecified atrial flutter: Secondary | ICD-10-CM | POA: Diagnosis present

## 2019-06-27 DIAGNOSIS — M25552 Pain in left hip: Secondary | ICD-10-CM | POA: Diagnosis not present

## 2019-06-27 DIAGNOSIS — M6281 Muscle weakness (generalized): Secondary | ICD-10-CM | POA: Diagnosis not present

## 2019-06-27 DIAGNOSIS — N400 Enlarged prostate without lower urinary tract symptoms: Secondary | ICD-10-CM | POA: Diagnosis present

## 2019-06-27 DIAGNOSIS — R0902 Hypoxemia: Secondary | ICD-10-CM | POA: Diagnosis not present

## 2019-06-27 DIAGNOSIS — Z7984 Long term (current) use of oral hypoglycemic drugs: Secondary | ICD-10-CM | POA: Diagnosis not present

## 2019-06-27 DIAGNOSIS — Z20822 Contact with and (suspected) exposure to covid-19: Secondary | ICD-10-CM | POA: Diagnosis present

## 2019-06-27 DIAGNOSIS — W1830XA Fall on same level, unspecified, initial encounter: Secondary | ICD-10-CM | POA: Diagnosis present

## 2019-06-27 DIAGNOSIS — Z833 Family history of diabetes mellitus: Secondary | ICD-10-CM

## 2019-06-27 DIAGNOSIS — Z4789 Encounter for other orthopedic aftercare: Secondary | ICD-10-CM | POA: Diagnosis not present

## 2019-06-27 DIAGNOSIS — F329 Major depressive disorder, single episode, unspecified: Secondary | ICD-10-CM | POA: Diagnosis present

## 2019-06-27 DIAGNOSIS — I10 Essential (primary) hypertension: Secondary | ICD-10-CM | POA: Diagnosis not present

## 2019-06-27 DIAGNOSIS — Z96649 Presence of unspecified artificial hip joint: Secondary | ICD-10-CM

## 2019-06-27 DIAGNOSIS — G459 Transient cerebral ischemic attack, unspecified: Secondary | ICD-10-CM | POA: Diagnosis present

## 2019-06-27 DIAGNOSIS — S299XXA Unspecified injury of thorax, initial encounter: Secondary | ICD-10-CM | POA: Diagnosis not present

## 2019-06-27 DIAGNOSIS — K5900 Constipation, unspecified: Secondary | ICD-10-CM

## 2019-06-27 DIAGNOSIS — M19042 Primary osteoarthritis, left hand: Secondary | ICD-10-CM | POA: Diagnosis present

## 2019-06-27 DIAGNOSIS — Z7401 Bed confinement status: Secondary | ICD-10-CM | POA: Diagnosis not present

## 2019-06-27 DIAGNOSIS — S72042D Displaced fracture of base of neck of left femur, subsequent encounter for closed fracture with routine healing: Secondary | ICD-10-CM | POA: Diagnosis not present

## 2019-06-27 DIAGNOSIS — E1122 Type 2 diabetes mellitus with diabetic chronic kidney disease: Secondary | ICD-10-CM | POA: Diagnosis present

## 2019-06-27 DIAGNOSIS — N1832 Chronic kidney disease, stage 3b: Secondary | ICD-10-CM | POA: Diagnosis not present

## 2019-06-27 DIAGNOSIS — M19041 Primary osteoarthritis, right hand: Secondary | ICD-10-CM | POA: Diagnosis present

## 2019-06-27 DIAGNOSIS — M2550 Pain in unspecified joint: Secondary | ICD-10-CM | POA: Diagnosis not present

## 2019-06-27 DIAGNOSIS — I130 Hypertensive heart and chronic kidney disease with heart failure and stage 1 through stage 4 chronic kidney disease, or unspecified chronic kidney disease: Secondary | ICD-10-CM | POA: Diagnosis present

## 2019-06-27 DIAGNOSIS — Z96642 Presence of left artificial hip joint: Secondary | ICD-10-CM | POA: Diagnosis not present

## 2019-06-27 DIAGNOSIS — I5022 Chronic systolic (congestive) heart failure: Secondary | ICD-10-CM | POA: Diagnosis present

## 2019-06-27 DIAGNOSIS — S72032A Displaced midcervical fracture of left femur, initial encounter for closed fracture: Secondary | ICD-10-CM | POA: Diagnosis not present

## 2019-06-27 DIAGNOSIS — W19XXXA Unspecified fall, initial encounter: Secondary | ICD-10-CM

## 2019-06-27 DIAGNOSIS — R32 Unspecified urinary incontinence: Secondary | ICD-10-CM | POA: Diagnosis not present

## 2019-06-27 DIAGNOSIS — E1165 Type 2 diabetes mellitus with hyperglycemia: Secondary | ICD-10-CM | POA: Diagnosis not present

## 2019-06-27 DIAGNOSIS — Z471 Aftercare following joint replacement surgery: Secondary | ICD-10-CM | POA: Diagnosis not present

## 2019-06-27 DIAGNOSIS — R69 Illness, unspecified: Secondary | ICD-10-CM | POA: Diagnosis not present

## 2019-06-27 DIAGNOSIS — R52 Pain, unspecified: Secondary | ICD-10-CM | POA: Diagnosis not present

## 2019-06-27 LAB — CBC WITH DIFFERENTIAL/PLATELET
Abs Immature Granulocytes: 0.03 10*3/uL (ref 0.00–0.07)
Basophils Absolute: 0 10*3/uL (ref 0.0–0.1)
Basophils Relative: 0 %
Eosinophils Absolute: 0.1 10*3/uL (ref 0.0–0.5)
Eosinophils Relative: 1 %
HCT: 36.2 % — ABNORMAL LOW (ref 39.0–52.0)
Hemoglobin: 12.2 g/dL — ABNORMAL LOW (ref 13.0–17.0)
Immature Granulocytes: 0 %
Lymphocytes Relative: 16 %
Lymphs Abs: 1.5 10*3/uL (ref 0.7–4.0)
MCH: 33.6 pg (ref 26.0–34.0)
MCHC: 33.7 g/dL (ref 30.0–36.0)
MCV: 99.7 fL (ref 80.0–100.0)
Monocytes Absolute: 0.6 10*3/uL (ref 0.1–1.0)
Monocytes Relative: 6 %
Neutro Abs: 7.2 10*3/uL (ref 1.7–7.7)
Neutrophils Relative %: 77 %
Platelets: 138 10*3/uL — ABNORMAL LOW (ref 150–400)
RBC: 3.63 MIL/uL — ABNORMAL LOW (ref 4.22–5.81)
RDW: 12.7 % (ref 11.5–15.5)
WBC: 9.4 10*3/uL (ref 4.0–10.5)
nRBC: 0 % (ref 0.0–0.2)

## 2019-06-27 LAB — PROTIME-INR
INR: 0.9 (ref 0.8–1.2)
Prothrombin Time: 12.5 seconds (ref 11.4–15.2)

## 2019-06-27 LAB — RESPIRATORY PANEL BY RT PCR (FLU A&B, COVID)
Influenza A by PCR: NEGATIVE
Influenza B by PCR: NEGATIVE
SARS Coronavirus 2 by RT PCR: NEGATIVE

## 2019-06-27 LAB — GLUCOSE, CAPILLARY
Glucose-Capillary: 172 mg/dL — ABNORMAL HIGH (ref 70–99)
Glucose-Capillary: 154 mg/dL — ABNORMAL HIGH (ref 70–99)
Glucose-Capillary: 206 mg/dL — ABNORMAL HIGH (ref 70–99)
Glucose-Capillary: 235 mg/dL — ABNORMAL HIGH (ref 70–99)

## 2019-06-27 LAB — TYPE AND SCREEN
ABO/RH(D): O POS
Antibody Screen: NEGATIVE

## 2019-06-27 LAB — BASIC METABOLIC PANEL
Anion gap: 7 (ref 5–15)
BUN: 30 mg/dL — ABNORMAL HIGH (ref 8–23)
CO2: 23 mmol/L (ref 22–32)
Calcium: 8.9 mg/dL (ref 8.9–10.3)
Chloride: 109 mmol/L (ref 98–111)
Creatinine, Ser: 2.13 mg/dL — ABNORMAL HIGH (ref 0.61–1.24)
GFR calc Af Amer: 32 mL/min — ABNORMAL LOW (ref >60)
GFR calc non Af Amer: 28 mL/min — ABNORMAL LOW (ref >60)
Glucose, Bld: 188 mg/dL — ABNORMAL HIGH (ref 70–99)
Potassium: 4.9 mmol/L (ref 3.5–5.1)
Sodium: 139 mmol/L (ref 135–145)

## 2019-06-27 LAB — ABO/RH: ABO/RH(D): O POS

## 2019-06-27 LAB — SURGICAL PCR SCREEN
MRSA, PCR: NEGATIVE
Staphylococcus aureus: NEGATIVE

## 2019-06-27 LAB — APTT: aPTT: 28 seconds (ref 24–36)

## 2019-06-27 LAB — BRAIN NATRIURETIC PEPTIDE: B Natriuretic Peptide: 27 pg/mL (ref 0.0–100.0)

## 2019-06-27 MED ORDER — MORPHINE SULFATE (PF) 4 MG/ML IV SOLN
4.0000 mg | Freq: Once | INTRAVENOUS | Status: AC
  Administered 2019-06-27: 4 mg via INTRAVENOUS
  Filled 2019-06-27: qty 1

## 2019-06-27 MED ORDER — INSULIN ASPART 100 UNIT/ML ~~LOC~~ SOLN
SUBCUTANEOUS | Status: AC
  Filled 2019-06-27: qty 1

## 2019-06-27 MED ORDER — CEFAZOLIN (ANCEF) 1 G IV SOLR: 2.0000 g | INTRAVENOUS | Status: DC

## 2019-06-27 MED ORDER — DIPHENHYDRAMINE HCL 25 MG PO CAPS: 50.0000 mg | ORAL_CAPSULE | Freq: Every evening | ORAL | Status: DC | PRN

## 2019-06-27 MED ORDER — ONDANSETRON HCL 4 MG/2ML IJ SOLN
4.0000 mg | Freq: Once | INTRAMUSCULAR | Status: AC
  Administered 2019-06-27: 4 mg via INTRAVENOUS
  Filled 2019-06-27: qty 2

## 2019-06-27 MED ORDER — CEFAZOLIN SODIUM-DEXTROSE 1-4 GM/50ML-% IV SOLN
1.0000 g | Freq: Once | INTRAVENOUS | Status: DC
  Filled 2019-06-27: qty 50

## 2019-06-27 MED ORDER — FAMOTIDINE 20 MG PO TABS: 20.0000 mg | ORAL_TABLET | Freq: Once | ORAL | Status: DC

## 2019-06-27 MED ORDER — MORPHINE SULFATE (PF) 2 MG/ML IV SOLN
0.5000 mg | INTRAVENOUS | Status: DC | PRN
  Administered 2019-06-27: 0.5 mg via INTRAVENOUS
  Filled 2019-06-27: qty 1

## 2019-06-27 MED ORDER — SODIUM CHLORIDE 0.9 % IV SOLN: Freq: Once | INTRAVENOUS | Status: AC

## 2019-06-27 MED ORDER — CARVEDILOL 3.125 MG PO TABS
3.1250 mg | ORAL_TABLET | Freq: Two times a day (BID) | ORAL | Status: DC
  Administered 2019-06-27 – 2019-07-05 (×16): 3.125 mg via ORAL
  Filled 2019-06-27 (×18): qty 1

## 2019-06-27 MED ORDER — ATORVASTATIN CALCIUM 20 MG PO TABS
40.0000 mg | ORAL_TABLET | Freq: Every day | ORAL | Status: DC
  Administered 2019-06-27 – 2019-07-04 (×8): 40 mg via ORAL
  Filled 2019-06-27 (×9): qty 2

## 2019-06-27 MED ORDER — HYDRALAZINE HCL 20 MG/ML IJ SOLN
5.0000 mg | INTRAMUSCULAR | Status: DC | PRN
  Administered 2019-07-02: 5 mg via INTRAVENOUS
  Filled 2019-06-27 (×2): qty 0.25

## 2019-06-27 MED ORDER — ACETAMINOPHEN 325 MG PO TABS: 650.0000 mg | ORAL_TABLET | Freq: Four times a day (QID) | ORAL | Status: DC | PRN

## 2019-06-27 MED ORDER — SERTRALINE HCL 25 MG PO TABS: 25.0000 mg | ORAL_TABLET | Freq: Every day | ORAL | Status: DC

## 2019-06-27 MED ORDER — FENTANYL CITRATE (PF) 100 MCG/2ML IJ SOLN
50.0000 ug | Freq: Once | INTRAMUSCULAR | Status: AC
  Administered 2019-06-27: 50 ug via INTRAVENOUS
  Filled 2019-06-27: qty 2

## 2019-06-27 MED ORDER — OXYCODONE-ACETAMINOPHEN 5-325 MG PO TABS
ORAL_TABLET | ORAL | Status: AC
  Administered 2019-06-27: 23:00:00 1 via ORAL
  Filled 2019-06-27: qty 1

## 2019-06-27 MED ORDER — LOSARTAN POTASSIUM 50 MG PO TABS
50.0000 mg | ORAL_TABLET | Freq: Every day | ORAL | Status: DC
  Administered 2019-06-27 – 2019-06-29 (×2): 50 mg via ORAL
  Filled 2019-06-27 (×3): qty 1

## 2019-06-27 MED ORDER — OMEGA-3-ACID ETHYL ESTERS 1 G PO CAPS
1.0000 g | ORAL_CAPSULE | Freq: Every day | ORAL | Status: DC
  Administered 2019-06-29 – 2019-07-05 (×7): 1 g via ORAL
  Filled 2019-06-27 (×8): qty 1

## 2019-06-27 MED ORDER — INSULIN ASPART 100 UNIT/ML ~~LOC~~ SOLN
0.0000 [IU] | Freq: Three times a day (TID) | SUBCUTANEOUS | Status: DC
  Administered 2019-06-27 (×2): 2 [IU] via SUBCUTANEOUS
  Administered 2019-06-28: 3 [IU] via SUBCUTANEOUS
  Filled 2019-06-27 (×2): qty 1

## 2019-06-27 MED ORDER — SERTRALINE HCL 50 MG PO TABS
25.0000 mg | ORAL_TABLET | Freq: Every day | ORAL | Status: DC
  Administered 2019-06-27 – 2019-07-05 (×8): 25 mg via ORAL
  Filled 2019-06-27 (×9): qty 1

## 2019-06-27 MED ORDER — DONEPEZIL HCL 5 MG PO TABS
10.0000 mg | ORAL_TABLET | Freq: Every day | ORAL | Status: DC
  Administered 2019-06-27 – 2019-07-05 (×8): 10 mg via ORAL
  Filled 2019-06-27 (×10): qty 2

## 2019-06-27 MED ORDER — SODIUM CHLORIDE FLUSH 0.9 % IV SOLN
INTRAVENOUS | Status: AC
  Administered 2019-06-27: 23:00:00 10 mL via INTRAVENOUS
  Filled 2019-06-27: qty 10

## 2019-06-27 MED ORDER — ONDANSETRON HCL 4 MG/2ML IJ SOLN
4.0000 mg | Freq: Three times a day (TID) | INTRAMUSCULAR | Status: DC | PRN
  Filled 2019-06-27 (×3): qty 2

## 2019-06-27 MED ORDER — TAMSULOSIN HCL 0.4 MG PO CAPS: 0.4000 mg | ORAL_CAPSULE | Freq: Every day | ORAL | Status: DC

## 2019-06-27 MED ORDER — LOSARTAN POTASSIUM 50 MG PO TABS: 50.0000 mg | ORAL_TABLET | Freq: Every day | ORAL | Status: DC

## 2019-06-27 MED ORDER — CARVEDILOL 3.125 MG PO TABS
3.1250 mg | ORAL_TABLET | Freq: Two times a day (BID) | ORAL | Status: DC
  Filled 2019-06-27: qty 1

## 2019-06-27 MED ORDER — SENNOSIDES-DOCUSATE SODIUM 8.6-50 MG PO TABS
1.0000 | ORAL_TABLET | Freq: Every evening | ORAL | Status: DC | PRN
  Filled 2019-06-27: qty 1

## 2019-06-27 MED ORDER — OXYCODONE-ACETAMINOPHEN 5-325 MG PO TABS
ORAL_TABLET | ORAL | Status: AC
  Filled 2019-06-27: qty 1

## 2019-06-27 MED ORDER — METHOCARBAMOL 500 MG PO TABS
500.0000 mg | ORAL_TABLET | Freq: Three times a day (TID) | ORAL | Status: DC | PRN
  Administered 2019-07-02: 500 mg via ORAL
  Filled 2019-06-27 (×2): qty 1

## 2019-06-27 MED ORDER — SODIUM CHLORIDE FLUSH 0.9 % IV SOLN
INTRAVENOUS | Status: AC
  Filled 2019-06-27: qty 10

## 2019-06-27 MED ORDER — MELATONIN 5 MG PO TABS
5.0000 mg | ORAL_TABLET | Freq: Every day | ORAL | Status: DC
  Administered 2019-06-27 – 2019-07-04 (×8): 5 mg via ORAL
  Filled 2019-06-27 (×9): qty 1

## 2019-06-27 MED ORDER — TAMSULOSIN HCL 0.4 MG PO CAPS
0.4000 mg | ORAL_CAPSULE | Freq: Every day | ORAL | Status: DC
  Administered 2019-06-27 – 2019-07-05 (×8): 0.4 mg via ORAL
  Filled 2019-06-27 (×9): qty 1

## 2019-06-27 MED ORDER — FUROSEMIDE 20 MG PO TABS: 20.0000 mg | ORAL_TABLET | Freq: Every day | ORAL | Status: DC

## 2019-06-27 MED ORDER — FUROSEMIDE 20 MG PO TABS
20.0000 mg | ORAL_TABLET | Freq: Every day | ORAL | Status: DC
  Administered 2019-06-27 – 2019-06-29 (×2): 20 mg via ORAL
  Filled 2019-06-27 (×3): qty 1

## 2019-06-27 MED ORDER — INSULIN ASPART 100 UNIT/ML ~~LOC~~ SOLN
0.0000 [IU] | Freq: Every day | SUBCUTANEOUS | Status: DC
  Administered 2019-06-27: 2 [IU] via SUBCUTANEOUS

## 2019-06-27 MED ORDER — OXYCODONE-ACETAMINOPHEN 5-325 MG PO TABS
1.0000 | ORAL_TABLET | ORAL | Status: DC | PRN
  Administered 2019-06-27 – 2019-06-28 (×2): 1 via ORAL

## 2019-06-27 MED ORDER — SODIUM CHLORIDE 0.9% FLUSH
10.0000 mL | Freq: Four times a day (QID) | INTRAVENOUS | Status: DC
  Administered 2019-06-27 – 2019-06-28 (×3): 10 mL via INTRAVENOUS

## 2019-06-27 NOTE — ED Notes (Signed)
Consent signed and placed with chart

## 2019-06-27 NOTE — ED Provider Notes (Signed)
[facility] Emergency Department Provider Note  ____________________________________________   First MD Initiated Contact with Patient 06/27/19 [phone]     (approximate)  I have reviewed the triage vital signs and the nursing notes.  History  Chief Complaint Hip Pain    HPI Cody Howard is a 85 y.o. male with past medical history as noted below who presents to the emergency department for a fall with resultant left-sided hip pain.  Patient states he was walking outside to get the paper when he lost balance trying to fix some of the hedge.  He fell directly onto his left hip.  He was unable to get up and then fell again onto the left hip.  He denies any head injury or loss of consciousness.  Not on any blood thinning medications.  Has some skin tears to the LUE, and LLE with obvious shortening and rotation.  Denies any numbness or tingling.  Reports associated sharp and aching pain to the left hip, 4/10 in severity, worsened with movement, improved with laying still.  No radiation.  Denies any history of prior hip injuries.   Past Medical Hx Past Medical History:  Diagnosis Date  . AF (atrial fibrillation) ([place])   . Arthritis    hands  . Chronic kidney disease   . Diabetes mellitus without complication ([place])   . Hyperlipidemia   . Hypertension   . Prostate enlargement     Problem List Patient Active Problem List   Diagnosis Date Noted  . History of anemia 05/31/2019  . Benign prostatic hyperplasia with lower urinary tract symptoms 05/31/2019  . Cerebral atrophy, mild ([place]) 05/31/2019  . Reactive depression 05/31/2019  . Essential hypertension 05/31/2019  . Hiatal hernia 05/31/2019  . Dizziness 04/12/2018  . TIA (transient ischemic attack) 01/25/2018  . Chronic systolic CHF (congestive heart failure), NYHA class 3 ([place]) 11/29/2017  . CKD (chronic kidney disease) stage 3, GFR 30-59 ml/min 08/18/2017  . Bradycardia 08/18/2017  . Atrial flutter,  paroxysmal ([place]) 07/21/2017  . Functional dyspnea 07/06/2017  . Cardiomyopathy ([place]) 07/06/2017  . Hyperlipidemia, mixed 06/20/2017  . Hyperlipidemia associated with type 2 diabetes mellitus ([place]) 04/06/2016  . Type 2 diabetes mellitus without complications ([place]) 58/85/0277  . Personal history of other diseases of male genital organs 02/10/2015  . Sebaceous cyst 08/28/2014    Past Surgical Hx Past Surgical History:  Procedure Laterality Date  . COLONOSCOPY N/A 02/26/2015   Procedure: COLONOSCOPY;  Surgeon: Hulen Luster, MD;  Location: [place];  Service: Gastroenterology;  Laterality: N/A;  . COLONOSCOPY WITH PROPOFOL N/A 11/02/2017   Procedure: COLONOSCOPY WITH PROPOFOL;  Surgeon: Toledo, Benay Pike, MD;  Location: [facility] ENDOSCOPY;  Service: Gastroenterology;  Laterality: N/A;  . ESOPHAGOGASTRODUODENOSCOPY N/A 02/26/2015   Procedure: ESOPHAGOGASTRODUODENOSCOPY (EGD);  Surgeon: Hulen Luster, MD;  Location: [place];  Service: Gastroenterology;  Laterality: N/A;  Diabetic - oral meds  . ESOPHAGOGASTRODUODENOSCOPY (EGD) WITH PROPOFOL N/A 11/02/2017   Procedure: ESOPHAGOGASTRODUODENOSCOPY (EGD) WITH PROPOFOL;  Surgeon: Toledo, Benay Pike, MD;  Location: [facility];  Service: Gastroenterology;  Laterality: N/A;  . KIDNEY SURGERY Right    surgery when 85 years old  . POLYPECTOMY  02/26/2015   Procedure: POLYPECTOMY;  Surgeon: Hulen Luster, MD;  Location: [place];  Service: Gastroenterology;;  . sebaceous cyst removal Right    located right of spine on upper back    Medications Prior to Admission medications   Medication Sig Start Date End Date Taking? Authorizing  Provider  ACCU-CHEK SOFTCLIX LANCETS lancets TEST BLOOD SUGAR EVERY DAY 09/19/15   [name], MD  Blood Glucose Monitoring Suppl (ACCU-CHEK AVIVA PLUS) w/Device KIT USE AS DIRECTED 03/11/15   [name], MD  carvedilol (COREG) 3.125 MG tablet Take 1 tablet (3.125 mg total) by mouth 2 (two) times  daily with a meal. 11/07/18   [name], MD  diphenhydrAMINE (BENADRYL) 50 MG capsule Take 50 mg by mouth. qhs    [provider]  donepezil (ARICEPT) 10 MG tablet Take 1 tablet (10 mg total) by mouth daily. Chipper Herb 06/12/19   [name], MD  furosemide (LASIX) 20 MG tablet Take 1 tablet (20 mg total) by mouth daily. Patient taking differently: Take 20 mg by mouth every morning.  11/07/18   [name], MD  losartan (COZAAR) 100 MG tablet Take 0.5 tablets (50 mg total) by mouth daily. Patient taking differently: Take 50 mg by mouth daily before breakfast.  11/07/18   [name], MD  Melatonin 5 MG TABS Take 1 tablet by mouth at bedtime.    [provider]  metFORMIN (GLUCOPHAGE-XR) 750 MG 24 hr tablet Take 1 tablet (750 mg total) by mouth daily with breakfast. 03/12/19   [name], MD  Omega-3 Fatty Acids (FISH OIL) 1000 MG CAPS Take 1 capsule (1,000 mg total) by mouth daily. 02/06/18   [name], MD  [facility] ULTRA test strip USE TO TEST ONCE A DAY 04/23/19   [name], MD  sertraline (ZOLOFT) 25 MG tablet Take 1 tablet (25 mg total) by mouth daily. Start with one half a day for 2 weeks 05/31/19   [name], MD  simvastatin (ZOCOR) 80 MG tablet Take 1 tablet (80 mg total) by mouth every evening. Patient taking differently: Take 80 mg by mouth every morning.  11/07/18   [name], MD    Allergies Patient has no known allergies.  Family Hx Family History  Problem Relation Age of Onset  . Diabetes Father   . Heart disease Father     Social Hx Social History   Tobacco Use  . Smoking status: Former Smoker    Quit date: 03/08/1978    Years since quitting: 41.3  . Smokeless tobacco: Never Used  Substance Use Topics  . Alcohol use: Yes    Alcohol/week: 0.0 standard drinks    Comment: Rarely  . Drug use: No     Review of Systems  Constitutional: Negative for fever. Negative for chills. Eyes: Negative for visual  changes. ENT: Negative for sore throat. Cardiovascular: Negative for chest pain. Respiratory: Negative for shortness of breath. Gastrointestinal: Negative for nausea. Negative for vomiting.  Genitourinary: Negative for dysuria. Musculoskeletal: Positive for left hip pain. Skin: Negative for rash. Neurological: Negative for headaches.   Physical Exam  Vital Signs: ED Triage Vitals  Enc Vitals Group     BP 06/27/19 0949 (!) 169/55     Pulse Rate 06/27/19 0949 83     Resp 06/27/19 0949 19     Temp 06/27/19 0949 97.7 F (36.5 C)     Temp Source 06/27/19 0949 Oral     SpO2 06/27/19 0949 99 %     Weight 06/27/19 0950 195 lb (88.5 kg)     Height 06/27/19 0950 _0  (1.803 m)     Head Circumference --      Peak Flow --      Pain Score 06/27/19 0950 4  Pain Loc --      Pain Edu? --      Excl. in [place]? --     Constitutional: Alert and oriented.  Elderly appearing, but in no acute distress. Head: Normocephalic. Atraumatic. Eyes: Conjunctivae clear. Sclera anicteric. Pupils equal and symmetric. Nose: No masses or lesions. No congestion or rhinorrhea. Mouth/Throat: Wearing mask.  Neck: No stridor. Trachea midline.  Cardiovascular: Normal rate, regular rhythm. Extremities well perfused. Respiratory: Normal respiratory effort.  Lungs CTAB.  Chest wall stable and nontender. Gastrointestinal: Soft. Non-distended. Non-tender.  Genitourinary: Deferred. Musculoskeletal: LLE shortened and held in external rotation.  TTP about the left hip.  Distally neurovascularly intact, able to wiggle toes, 2+ DP pulse.  Toes are warm and well-perfused.  Compartments are soft and compressible.  FROM bilateral shoulders, elbows, wrists. Neurologic:  Normal speech and language. No gross focal or lateralizing neurologic deficits are appreciated.  Skin: Scattered skin tears to the LUE.  No lacerations. Psychiatric: Mood and affect are appropriate for situation.  EKG  Personally reviewed and interpreted  by myself.   Date: 06/27/19 Time: 0948 Rate: 56 Rhythm: sinus Axis: normal Intervals: WNL Sinus brady, no acute ischemic changes No STEMI    Radiology  Personally reviewed available imaging myself.   XR left hip IMPRESSION:  1. Non comminuted, mildly displaced and angulated left femoral neck  fracture. No dislocation.   CXR IMPRESSION:  No active disease.    Procedures  Procedure(s) performed (including critical care):  Procedures   Initial Impression / Assessment and Plan / MDM / ED Course  86 y.o. male who presents to the ED for a nonsyncopal fall with resultant left hip pain.  Ddx: fracture, contusion, dislocation  Will plan for imaging, basic labs, EKG  Clinical Course as of Jun 27 1206  Wed Jun 27, 2019  1136 XR reveals L femoral neck fracture. Will FYI orthopedics and plan to admit.   [SM]  1208 Discussed with orthopedics and hospitalist for admission.  Labs reveal creatinine near baseline in the setting of his CKD.  Preoperative EKG and COVID swab obtained.   [SM]    Clinical Course User Index [SM] Lilia Pro., MD     _______________________________   As part of my medical decision making I have reviewed available labs, radiology tests, reviewed old records/performed chart review, obtained additional history from family (wife at bedside), and discussed with consultants (Dr. Posey Pronto, orthopedics).     Final Clinical Impression(s) / ED Diagnosis  Final diagnoses:  Fall, initial encounter  Left hip pain  Closed fracture of left hip, initial encounter [facility])       Note:  This document was prepared using Dragon voice recognition software and may include unintentional dictation errors.   Lilia Pro., MD 06/27/19 [phone]

## 2019-06-27 NOTE — ED Triage Notes (Signed)
Pt comes EMS after a mechanical fall. Did not hit head. No LOC. No thinners. Left hip pain with shortening and rotation.

## 2019-06-27 NOTE — H&P (Addendum)
History and Physical    Cody Howard DOB: 1934/07/03 DOA: 06/27/2019  Referring MD/NP/PA:   PCP: [name], MD   Patient coming from:  The patient is coming from home.  At baseline, pt is independent for most of ADL.        Chief Complaint: fall and left hip pain  HPI: Cody Howard is a 85 y.o. male with medical history significant of hypertension, hyperlipidemia, diabetes mellitus, TIA, depression, atrial fibrillation not on anticoagulants, CKD stage IIIa, sCHF with EF of 45%, who presents with fall and left hip pain.  Pt states that fell on the side way of his home when he was walking outside to get the paper. He injured his left hip, causing severe pain in left hip. No loss of consciousness.  Strongly denies any head or neck injury.  Refused CT of head and neck.  Patient does not have chest pain, shortness breath, cough.  No nausea vomiting, diarrhea, abdominal pain, symptoms of UTI or unilateral weakness. Of note, pt had laceration to right palm on 4/10,which was sutured up and healing well.   ED Course: pt was found to have WBC 9.4, BNP 27, INR 0.9, negative COVID-19 PCR, renal function close to baseline, temperature normal, blood pressure 169/55, heart rate 83, RR 19, oxygen saturation 99% on room air.  Chest x-ray negative. X-ray of left hip/pelvis showed left displaced femoral neck fracture. Pt is admitted to [place] bed as inpatient.  Orthopedic surgeon, Dr. Posey Pronto is consulted.  Review of Systems:   General: no fevers, chills, no body weight gain, has fatigue HEENT: no blurry vision, hearing changes or sore throat Respiratory: no dyspnea, coughing, wheezing CV: no chest pain, no palpitations GI: no nausea, vomiting, abdominal pain, diarrhea, constipation GU: no dysuria, burning on urination, increased urinary frequency, hematuria  Ext: no leg edema Neuro: no unilateral weakness, numbness, or tingling, no vision change or hearing loss. Has fall.  Skin:  no rash. Has recent laceration to right palm which was sutured up and healing well.  MSK: has let hip pain Heme: No easy bruising.  Travel history: No recent long distant travel.  Allergy: No Known Allergies  Past Medical History:  Diagnosis Date  . AF (atrial fibrillation) ([place])   . Arthritis    hands  . Chronic kidney disease   . Diabetes mellitus without complication ([place])   . Hyperlipidemia   . Hypertension   . Prostate enlargement     Past Surgical History:  Procedure Laterality Date  . COLONOSCOPY N/A 02/26/2015   Procedure: COLONOSCOPY;  Surgeon: Hulen Luster, MD;  Location: [facility];  Service: Gastroenterology;  Laterality: N/A;  . COLONOSCOPY WITH PROPOFOL N/A 11/02/2017   Procedure: COLONOSCOPY WITH PROPOFOL;  Surgeon: Toledo, Benay Pike, MD;  Location: [facility];  Service: Gastroenterology;  Laterality: N/A;  . ESOPHAGOGASTRODUODENOSCOPY N/A 02/26/2015   Procedure: ESOPHAGOGASTRODUODENOSCOPY (EGD);  Surgeon: Hulen Luster, MD;  Location: [facility];  Service: Gastroenterology;  Laterality: N/A;  Diabetic - oral meds  . ESOPHAGOGASTRODUODENOSCOPY (EGD) WITH PROPOFOL N/A 11/02/2017   Procedure: ESOPHAGOGASTRODUODENOSCOPY (EGD) WITH PROPOFOL;  Surgeon: Toledo, Benay Pike, MD;  Location: [facility];  Service: Gastroenterology;  Laterality: N/A;  . KIDNEY SURGERY Right    surgery when 85 years old  . POLYPECTOMY  02/26/2015   Procedure: POLYPECTOMY;  Surgeon: Hulen Luster, MD;  Location: [facility];  Service: Gastroenterology;;  . sebaceous cyst removal Right    located right of spine  on upper back    Social History:  reports that he quit smoking about 41 years ago. He has never used smokeless tobacco. He reports current alcohol use. He reports that he does not use drugs.  Family History:  Family History  Problem Relation Age of Onset  . Diabetes Father   . Heart disease Father      Prior to Admission medications   Medication Sig Start Date  End Date Taking? Authorizing Provider  ACCU-CHEK SOFTCLIX LANCETS lancets TEST BLOOD SUGAR EVERY DAY 09/19/15   [name], MD  Blood Glucose Monitoring Suppl (ACCU-CHEK AVIVA PLUS) w/Device KIT USE AS DIRECTED 03/11/15   [name], MD  carvedilol (COREG) 3.125 MG tablet Take 1 tablet (3.125 mg total) by mouth 2 (two) times daily with a meal. 11/07/18   [name], MD  diphenhydrAMINE (BENADRYL) 50 MG capsule Take 50 mg by mouth. qhs    [provider]  donepezil (ARICEPT) 10 MG tablet Take 1 tablet (10 mg total) by mouth daily. Chipper Herb 06/12/19   [name], MD  furosemide (LASIX) 20 MG tablet Take 1 tablet (20 mg total) by mouth daily. Patient taking differently: Take 20 mg by mouth every morning.  11/07/18   [name], MD  losartan (COZAAR) 100 MG tablet Take 0.5 tablets (50 mg total) by mouth daily. Patient taking differently: Take 50 mg by mouth daily before breakfast.  11/07/18   [name], MD  Melatonin 5 MG TABS Take 1 tablet by mouth at bedtime.    [provider]  metFORMIN (GLUCOPHAGE-XR) 750 MG 24 hr tablet Take 1 tablet (750 mg total) by mouth daily with breakfast. 03/12/19   [name], MD  Omega-3 Fatty Acids (FISH OIL) 1000 MG CAPS Take 1 capsule (1,000 mg total) by mouth daily. 02/06/18   [name], MD  [facility] ULTRA test strip USE TO TEST ONCE A DAY 04/23/19   [name], MD  sertraline (ZOLOFT) 25 MG tablet Take 1 tablet (25 mg total) by mouth daily. Start with one half a day for 2 weeks 05/31/19   [name], MD  simvastatin (ZOCOR) 80 MG tablet Take 1 tablet (80 mg total) by mouth every evening. Patient taking differently: Take 80 mg by mouth every morning.  11/07/18   [name], MD    Physical Exam: Vitals:   06/27/19 1200 06/27/19 1230 06/27/19 1300 06/27/19 1730  BP: (!) 156/65 (!) 156/97 138/82 (!) 149/62  Pulse: (!) 56 62 (!) 58 72  Resp: '17 17 19 16  ' Temp:    99.5 F (37.5 C)  TempSrc:      SpO2:  91% 100% 100% 94%  Weight:      Height:       General: Not in acute distress HEENT:       Eyes: PERRL, EOMI, no scleral icterus.       ENT: No discharge from the ears and nose, no pharynx injection, no tonsillar enlargement.        Neck: No JVD, no bruit, no mass felt. Heme: No neck lymph node enlargement. Cardiac: S1/S2, RRR, No murmurs, No gallops or rubs. Respiratory: No rales, wheezing, rhonchi or rubs. GI: Soft, nondistended, nontender, no rebound pain, no organomegaly, BS present. GU: No hematuria Ext: No pitting leg edema bilaterally. 2+DP/PT pulse bilaterally. Musculoskeletal: has left hip tenderness. Left leg is shortened. Skin: No rashes.  Neuro: Alert, oriented X3, cranial nerves II-XII grossly  intact, moves all extremities. Psych: Patient is not psychotic, no suicidal or hemocidal ideation.  Labs on Admission: I have personally reviewed following labs and imaging studies  CBC: Recent Labs  Lab 06/27/19 1118  WBC 9.4  NEUTROABS 7.2  HGB 12.2*  HCT 36.2*  MCV 99.7  PLT 680*   Basic Metabolic Panel: Recent Labs  Lab 06/27/19 1118  NA 139  K 4.9  CL 109  CO2 23  GLUCOSE 188*  BUN 30*  CREATININE 2.13*  CALCIUM 8.9   GFR: Estimated Creatinine Clearance: 28.5 mL/min (A) (by C-G formula based on SCr of 2.13 mg/dL (H)). Liver Function Tests: No results for input(s): AST, ALT, ALKPHOS, BILITOT, PROT, ALBUMIN in the last 168 hours. No results for input(s): LIPASE, AMYLASE in the last 168 hours. No results for input(s): AMMONIA in the last 168 hours. Coagulation Profile: Recent Labs  Lab 06/27/19 1118  INR 0.9   Cardiac Enzymes: No results for input(s): CKTOTAL, CKMB, CKMBINDEX, TROPONINI in the last 168 hours. BNP (last 3 results) No results for input(s): PROBNP in the last 8760 hours. HbA1C: No results for input(s): HGBA1C in the last 72 hours. CBG: Recent Labs  Lab 06/27/19 1210 06/27/19 1525 06/27/19 1625  GLUCAP 172* 154* 206*   Lipid  Profile: No results for input(s): CHOL, HDL, LDLCALC, TRIG, CHOLHDL, LDLDIRECT in the last 72 hours. Thyroid Function Tests: No results for input(s): TSH, T4TOTAL, FREET4, T3FREE, THYROIDAB in the last 72 hours. Anemia Panel: No results for input(s): VITAMINB12, FOLATE, FERRITIN, TIBC, IRON, RETICCTPCT in the last 72 hours. Urine analysis:    Component Value Date/Time   COLORURINE YELLOW (A) 01/01/2019 1201   APPEARANCEUR CLEAR (A) 01/01/2019 1201   LABSPEC 1.013 01/01/2019 1201   PHURINE 5.0 01/01/2019 1201   GLUCOSEU NEGATIVE 01/01/2019 1201   HGBUR NEGATIVE 01/01/2019 1201   [place] 01/01/2019 1201   KETONESUR NEGATIVE 01/01/2019 1201   PROTEINUR 100 (A) 01/01/2019 1201   NITRITE NEGATIVE 01/01/2019 1201   [place] 01/01/2019 1201   Sepsis Labs: '@LABRCNTIP' (procalcitonin:4,lacticidven:4) ) Recent Results (from the past 240 hour(s))  Respiratory Panel by RT PCR (Flu A&B, Covid) - Nasopharyngeal Swab     Status: None   Collection Time: 06/27/19 11:54 AM   Specimen: Nasopharyngeal Swab  Result Value Ref Range Status   SARS Coronavirus 2 by RT PCR NEGATIVE NEGATIVE Final    Comment: (NOTE) SARS-CoV-2 target nucleic acids are NOT DETECTED. The SARS-CoV-2 RNA is generally detectable in upper respiratoy specimens during the acute phase of infection. The lowest concentration of SARS-CoV-2 viral copies this assay can detect is 131 copies/mL. A negative result does not preclude SARS-Cov-2 infection and should not be used as the sole basis for treatment or other patient management decisions. A negative result may occur with  improper specimen collection/handling, submission of specimen other than nasopharyngeal swab, presence of viral mutation(s) within the areas targeted by this assay, and inadequate number of viral copies (<131 copies/mL). A negative result must be combined with clinical observations, patient history, and epidemiological information.  The expected result is Negative. Fact Sheet for Patients:  PinkCheek.be Fact Sheet for Healthcare Providers:  GravelBags.it This test is not yet ap proved or cleared by the [place] FDA and  has been authorized for detection and/or diagnosis of SARS-CoV-2 by FDA under an Emergency Use Authorization (EUA). This EUA will remain  in effect (meaning this test can be used) for the duration of the COVID-19 declaration under Section 564(b)(1) of the Act,  21 U.S.C. section 360bbb-3(b)(1), unless the authorization is terminated or revoked sooner.    Influenza A by PCR NEGATIVE NEGATIVE Final   Influenza B by PCR NEGATIVE NEGATIVE Final    Comment: (NOTE) The Xpert Xpress SARS-CoV-2/FLU/RSV assay is intended as an aid in  the diagnosis of influenza from Nasopharyngeal swab specimens and  should not be used as a sole basis for treatment. Nasal washings and  aspirates are unacceptable for Xpert Xpress SARS-CoV-2/FLU/RSV  testing. Fact Sheet for Patients: PinkCheek.be Fact Sheet for Healthcare Providers: GravelBags.it This test is not yet approved or cleared by the [place] FDA and  has been authorized for detection and/or diagnosis of SARS-CoV-2 by  FDA under an Emergency Use Authorization (EUA). This EUA will remain  in effect (meaning this test can be used) for the duration of the  Covid-19 declaration under Section 564(b)(1) of the Act, 21  U.S.C. section 360bbb-3(b)(1), unless the authorization is  terminated or revoked. Performed at [facility], [address], [place], [place] [address]      Radiological Exams on Admission: DG Chest 1 View  Result Date: 06/27/2019 CLINICAL DATA:  Fall with left hip fracture. EXAM: CHEST  1 VIEW COMPARISON:  06/28/2016 FINDINGS: Cardiac silhouette is normal in size. No mediastinal or hilar masses. No evidence of  adenopathy. Clear lungs.  No pleural effusion or pneumothorax. Skeletal structures are grossly intact. IMPRESSION: No active disease. Electronically Signed   By: Lajean Manes M.D.   On: 06/27/2019 11:13   DG Hip Unilat With Pelvis 2-3 Views Left  Result Date: 06/27/2019 CLINICAL DATA:  Fall.  Left hip pain. EXAM: DG HIP (WITH OR WITHOUT PELVIS) 2-3V LEFT COMPARISON:  None. FINDINGS: Non comminuted mid left femoral neck fracture. Distal fracture component has mildly displaced superiorly by approximately 1.5 cm. There is mild varus angulation as well as apex anterior angulation. No other fractures.  No bone lesions. Hip joints, SI joints and symphysis pubis are normally aligned. Soft tissues are unremarkable. IMPRESSION: 1. Non comminuted, mildly displaced and angulated left femoral neck fracture. No dislocation. Electronically Signed   By: Lajean Manes M.D.   On: 06/27/2019 11:12     EKG: Independently reviewed.  Sinus rhythm, QTC 428, bradycardia, early of progression, nonspecific T wave change  Assessment/Plan Principal Problem:   Closed displaced fracture of left femoral neck (HCC) Active Problems:   Type II diabetes mellitus with renal manifestations (HCC)   CKD (chronic kidney disease) stage 3, GFR 30-59 ml/min   Hyperlipidemia, mixed   Chronic systolic CHF (congestive heart failure), NYHA class 3 (HCC)   TIA (transient ischemic attack)   Essential hypertension   Atrial fibrillation, chronic ([place])   Depression   Fall at home, initial encounter   Closed displaced fracture of left femoral neck ([place]):  As evidenced by x-ray. Patient has moderate pain now. No neurovascular compromise. Orthopedic surgeon, Dr. Posey Pronto was consulted.   - will admit to Med-surg bed - Pain control: morphine prn and percocet - When necessary Zofran for nausea - Robaxin for muscle spasm - Appreciated Dr. consultation - type and cross - INR/PTT - PT/OT when able to (not ordered now)  Type II diabetes  mellitus with renal manifestations ([place]): Most recent A1c 6.6, well controled. Patient is taking Metformin at home -SSI  CKD (chronic kidney disease) stage 3a, GFR 30-59 ml/min: Renal function is close to baseline.  Recent baseline creatinine 2.0-2.1.  His creatinine is 2.13, BUN 30 -f/y by BMP  Hyperlipidemia, mixed: -  statin  Chronic systolic CHF (congestive heart failure), NYHA class 3 ([place]): 2D echo on 03/05/2019 showed EF of 45%.  Patient does not have leg edema JVD.  No shortness of breath.  CHF is compensated.  BNP 27 -Continue home dose Lasix and Coreg  TIA (transient ischemic attack): -statin  HTN:  -Continue home medications: Cozaar, Coreg -Patient is also on Lasix -hydralazine prn  Atrial fibrillation, chronic ([place]): Not on anticoagulants.  Heart rate 83 -Cardiac monitor -Continue Coreg  Depression -Continue Zoloft  Fall at home, initial encounter -PT/OT when able to     Perioperative Cardiac Risk: He has multiple comorbidities, including  hypertension, hyperlipidemia, diabetes mellitus, TIA, depression, atrial fibrillation not on anticoagulants, CKD stage IIIa, sCHF with EF of 45%. Currently patient is active and independent of his ADLs, IADLs. No recent acute cardiac issues.  Patient does not have chest pain, shortness of breath, palpitation, leg edema.  No signs of acute CHF exacerbation currently.  EKG has no acute change. At this time point, no further work up is needed. His GUPTA score perioperative myocardial infarction or cardaic arrest is 1.56% which is moderate of risk. Pt will be on coreg, statin and lasix.  I discussed the risk with patient and his wife. They want to do the surgery.   DVT ppx: SCD Code Status: Full code Family Communication:  Yes, patient's wife at bed side Disposition Plan:  Anticipate discharge back to previous home environment Consults called:  Ortho, Dr. Posey Pronto Admission status: Med-surg bed as inpt    Status is: Inpatient Remains  inpatient appropriate because: Inpatient level of care appropriate due to severity of illness and needs of surgery Dispo: The patient is from: Home              Anticipated d/c is to: SNF, possibly need rehab placement              Anticipated d/c date is: 2 days              Patient currently is not medically stable to d/c.            Date of Service 06/27/2019    [facility]   If 7PM-7AM, please contact night-coverage [email or web address] 06/27/2019, 6:30 PM

## 2019-06-27 NOTE — TOC Initial Note (Signed)
Transition of Care [facility]) - Initial/Assessment Note    Patient Details  Name: Cody Howard MRN: [id number] Date of Birth: 1934-12-08  [facility] of [facility]) CM/SW Contact:    Anselm Pancoast, RN Phone Number: 06/27/2019, 3:28 PM  Clinical Narrative:                 Left message for spouse to discuss potential discharge plan and needs. Potential SNF placement. Patient in pain and unable to discuss at this time.         Patient Goals and CMS Choice        Expected Discharge Plan and Services                                                Prior Living Arrangements/Services                       Activities of Daily Living      Permission Sought/Granted                  Emotional Assessment              Admission diagnosis:  Closed left hip fracture ([place]) [S72.002A] Patient Active Problem List   Diagnosis Date Noted  . Atrial fibrillation, chronic ([place]) 06/27/2019  . Depression 06/27/2019  . Fall at home, initial encounter 06/27/2019  . Closed displaced fracture of left femoral neck ([place]) 06/27/2019  . Closed left hip fracture ([place]) 06/27/2019  . History of anemia 05/31/2019  . Benign prostatic hyperplasia with lower urinary tract symptoms 05/31/2019  . Cerebral atrophy, mild ([place]) 05/31/2019  . Reactive depression 05/31/2019  . Essential hypertension 05/31/2019  . Hiatal hernia 05/31/2019  . Dizziness 04/12/2018  . TIA (transient ischemic attack) 01/25/2018  . Chronic systolic CHF (congestive heart failure), NYHA class 3 ([place]) 11/29/2017  . CKD (chronic kidney disease) stage 3, GFR 30-59 ml/min 08/18/2017  . Bradycardia 08/18/2017  . Atrial flutter, paroxysmal ([place]) 07/21/2017  . Functional dyspnea 07/06/2017  . Cardiomyopathy ([place]) 07/06/2017  . Hyperlipidemia, mixed 06/20/2017  . Hyperlipidemia associated with type 2 diabetes mellitus ([place]) 04/06/2016  . Type II diabetes mellitus with renal manifestations ([place]) 02/10/2015   . Personal history of other diseases of male genital organs 02/10/2015  . Sebaceous cyst 08/28/2014   PCP:  [name], MD Pharmacy:   [id number] - [place], [place] [address] Phone: [phone] Fax: [place], [place] [facility] [address] [place] [address] [place] [address] Phone: [phone] Fax: [place], [place] [facility] [address] [place] [facility] [place] [address] Phone: [phone] Fax: [phone]     Social Determinants of Health (SDOH) Interventions    Readmission Risk Interventions No flowsheet data found.

## 2019-06-27 NOTE — Consult Note (Signed)
ORTHOPAEDIC CONSULTATION  REQUESTING PHYSICIAN: Lilia Pro., MD  Chief Complaint:   L hip pain  History of Present Illness: Cody Howard is a 85 y.o. male who had a fall outside of his home earlier today.  The patient noted immediate hip pain and inability to ambulate.  The patient ambulates unassisted at baseline. He lives with his wife. Pain is described as sharp at its worst and a dull ache at its best.  Pain is rated a 10 out of 10 in severity.  Pain is improved with rest and immobilization.  Pain is worse with any sort of movement.  X-rays in the emergency department show a left displaced femoral neck fracture.  Of note, he has significant CKD and a history of DM. He also has a history of a-fib, but is not on any anticoagulation.  Past Medical History:  Diagnosis Date  . AF (atrial fibrillation) ([place])   . Arthritis    hands  . Chronic kidney disease   . Diabetes mellitus without complication ([place])   . Hyperlipidemia   . Hypertension   . Prostate enlargement    Past Surgical History:  Procedure Laterality Date  . COLONOSCOPY N/A 02/26/2015   Procedure: COLONOSCOPY;  Surgeon: Hulen Luster, MD;  Location: [place];  Service: Gastroenterology;  Laterality: N/A;  . COLONOSCOPY WITH PROPOFOL N/A 11/02/2017   Procedure: COLONOSCOPY WITH PROPOFOL;  Surgeon: Toledo, Benay Pike, MD;  Location: [facility];  Service: Gastroenterology;  Laterality: N/A;  . ESOPHAGOGASTRODUODENOSCOPY N/A 02/26/2015   Procedure: ESOPHAGOGASTRODUODENOSCOPY (EGD);  Surgeon: Hulen Luster, MD;  Location: [place];  Service: Gastroenterology;  Laterality: N/A;  Diabetic - oral meds  . ESOPHAGOGASTRODUODENOSCOPY (EGD) WITH PROPOFOL N/A 11/02/2017   Procedure: ESOPHAGOGASTRODUODENOSCOPY (EGD) WITH PROPOFOL;  Surgeon: Toledo, Benay Pike, MD;  Location: [facility];  Service: Gastroenterology;  Laterality: N/A;  . KIDNEY SURGERY  Right    surgery when 85 years old  . POLYPECTOMY  02/26/2015   Procedure: POLYPECTOMY;  Surgeon: Hulen Luster, MD;  Location: [place];  Service: Gastroenterology;;  . sebaceous cyst removal Right    located right of spine on upper back   Social History   Socioeconomic History  . Marital status: Married    Spouse name: Not on file  . Number of children: 2  . Years of education: Not on file  . Highest education level: Associate degree: occupational, Hotel manager, or vocational program  Occupational History  . Occupation: retired  Tobacco Use  . Smoking status: Former Smoker    Quit date: 03/08/1978    Years since quitting: 41.3  . Smokeless tobacco: Never Used  Substance and Sexual Activity  . Alcohol use: Yes    Alcohol/week: 0.0 standard drinks    Comment: Rarely  . Drug use: No  . Sexual activity: Not Currently  Other Topics Concern  . Not on file  Social History Narrative  . Not on file   Social Determinants of Health   Financial Resource Strain:   . Difficulty of Paying Living Expenses:   Food Insecurity:   . Worried About Charity fundraiser in the Last Year:   . Arboriculturist in the Last Year:   Transportation Needs:   . Film/video editor (Medical):   Marland Kitchen Lack of Transportation (Non-Medical):   Physical Activity:   . Days of Exercise per Week:   . Minutes of Exercise per Session:   Stress:   . Feeling of Stress :  Social Connections:   . Frequency of Communication with Friends and Family:   . Frequency of Social Gatherings with Friends and Family:   . Attends Religious Services:   . Active Member of Clubs or Organizations:   . Attends Archivist Meetings:   Marland Kitchen Marital Status:    Family History  Problem Relation Age of Onset  . Diabetes Father   . Heart disease Father    No Known Allergies Prior to Admission medications   Medication Sig Start Date End Date Taking? Authorizing Provider  carvedilol (COREG) 3.125 MG tablet Take 1  tablet (3.125 mg total) by mouth 2 (two) times daily with a meal. 11/07/18  Yes [name], MD  cephALEXin (KEFLEX) 500 MG capsule Take 500 mg by mouth 2 (two) times daily. 06/25/19 07/02/19 Yes [provider]  diphenhydrAMINE (BENADRYL) 50 MG capsule Take 50 mg by mouth at bedtime as needed for allergies or sleep.    Yes [provider]  donepezil (ARICEPT) 10 MG tablet Take 1 tablet (10 mg total) by mouth daily. Chipper Herb 06/12/19  Yes [name], MD  furosemide (LASIX) 20 MG tablet Take 1 tablet (20 mg total) by mouth daily. 11/07/18  Yes [name], MD  losartan (COZAAR) 100 MG tablet Take 0.5 tablets (50 mg total) by mouth daily. 11/07/18  Yes [name], MD  Melatonin 5 MG TABS Take 5 mg by mouth at bedtime.    Yes [provider]  metFORMIN (GLUCOPHAGE-XR) 750 MG 24 hr tablet Take 1 tablet (750 mg total) by mouth daily with breakfast. 03/12/19  Yes [name], MD  Omega-3 Fatty Acids (FISH OIL) 1000 MG CAPS Take 1 capsule (1,000 mg total) by mouth daily. 02/06/18  Yes [name], MD  sertraline (ZOLOFT) 25 MG tablet Take 25 mg by mouth daily. 06/27/19  Yes [provider]  simvastatin (ZOCOR) 80 MG tablet Take 1 tablet (80 mg total) by mouth every evening. Patient taking differently: Take 80 mg by mouth daily.  11/07/18  Yes [name], MD  tamsulosin (FLOMAX) 0.4 MG CAPS capsule Take 0.4 mg by mouth daily. 05/01/19  Yes [provider]   Recent Labs    06/27/19 1118  WBC 9.4  HGB 12.2*  HCT 36.2*  PLT 138*  K 4.9  CL 109  CO2 23  BUN 30*  CREATININE 2.13*  GLUCOSE 188*  CALCIUM 8.9  INR 0.9   DG Chest 1 View  Result Date: 06/27/2019 CLINICAL DATA:  Fall with left hip fracture. EXAM: CHEST  1 VIEW COMPARISON:  06/28/2016 FINDINGS: Cardiac silhouette is normal in size. No mediastinal or hilar masses. No evidence of adenopathy. Clear lungs.  No pleural effusion or pneumothorax. Skeletal structures are grossly  intact. IMPRESSION: No active disease. Electronically Signed   By: Lajean Manes M.D.   On: 06/27/2019 11:13   DG Hip Unilat With Pelvis 2-3 Views Left  Result Date: 06/27/2019 CLINICAL DATA:  Fall.  Left hip pain. EXAM: DG HIP (WITH OR WITHOUT PELVIS) 2-3V LEFT COMPARISON:  None. FINDINGS: Non comminuted mid left femoral neck fracture. Distal fracture component has mildly displaced superiorly by approximately 1.5 cm. There is mild varus angulation as well as apex anterior angulation. No other fractures.  No bone lesions. Hip joints, SI joints and symphysis pubis are normally aligned. Soft tissues are unremarkable. IMPRESSION: 1. Non comminuted, mildly displaced and angulated left femoral neck fracture. No dislocation. Electronically Signed   By:  Lajean Manes M.D.   On: 06/27/2019 11:12     Positive ROS: All other systems have been reviewed and were otherwise negative with the exception of those mentioned in the HPI and as above.  Physical Exam: BP (!) 156/97   Pulse 62   Temp 97.7 F (36.5 C) (Oral)   Resp 17   Ht 5\' 11"  (1.803 m)   Wt 88.5 kg   SpO2 100%   BMI 27.20 kg/m  General:  Alert, no acute distress Psychiatric:  Patient is competent for consent with normal mood and affect   Cardiovascular:  No pedal edema, regular rate and rhythm Respiratory:  No wheezing, non-labored breathing GI:  Abdomen is soft and non-tender Skin:  No lesions in the area of chief complaint, no erythema Neurologic:  Sensation intact distally, CN grossly intact Lymphatic:  No axillary or cervical lymphadenopathy  Orthopedic Exam:  LLE: + DF/PF/EHL SILT grossly over foot Foot wwp +Log roll/axial load   X-rays:  As above: left displaced femoral neck fracture  Assessment/Plan: Cody Howard is a 85 y.o. male with a left displaced femoral neck fracture   1. I discussed the various treatment options including both surgical and non-surgical management of her fracture with the patient. We  discussed the high risk of perioperative complications due to patient's age and other co-morbidities. After discussion of risks, benefits, and alternatives to surgery, the family and patient were in agreement to proceed with surgery. The goals of surgery would be to provide adequate pain relief and allow for mobilization. Plan for surgery is R hip hemiarthroplasty tomorrow, 06/28/19.  2. NPO after midnight 3. Hold anticoagulation in advance of OR 4. Admit to Hospitalist service for medical optimization.        Leim Fabry   06/27/2019 1:24 PM

## 2019-06-28 ENCOUNTER — Inpatient Hospital Stay: Payer: Medicare HMO | Admitting: Anesthesiology

## 2019-06-28 ENCOUNTER — Inpatient Hospital Stay: Payer: Medicare HMO

## 2019-06-28 ENCOUNTER — Encounter
Admission: EM | Disposition: A | Discharge status: Skilled Nursing Facility | Payer: Self-pay | Source: Home / Self Care | Attending: Internal Medicine

## 2019-06-28 DIAGNOSIS — Z96649 Presence of unspecified artificial hip joint: Secondary | ICD-10-CM | POA: Insufficient documentation

## 2019-06-28 HISTORY — PX: HIP ARTHROPLASTY: SHX981

## 2019-06-28 LAB — BASIC METABOLIC PANEL
Anion gap: 8 (ref 5–15)
BUN: 32 mg/dL — ABNORMAL HIGH (ref 8–23)
CO2: 24 mmol/L (ref 22–32)
Calcium: 8.7 mg/dL — ABNORMAL LOW (ref 8.9–10.3)
Chloride: 105 mmol/L (ref 98–111)
Creatinine, Ser: 2.04 mg/dL — ABNORMAL HIGH (ref 0.61–1.24)
GFR calc Af Amer: 34 mL/min — ABNORMAL LOW (ref >60)
GFR calc non Af Amer: 29 mL/min — ABNORMAL LOW (ref >60)
Glucose, Bld: 219 mg/dL — ABNORMAL HIGH (ref 70–99)
Potassium: 5.2 mmol/L — ABNORMAL HIGH (ref 3.5–5.1)
Sodium: 137 mmol/L (ref 135–145)

## 2019-06-28 LAB — CBC
HCT: 33.6 % — ABNORMAL LOW (ref 39.0–52.0)
Hemoglobin: 11.3 g/dL — ABNORMAL LOW (ref 13.0–17.0)
MCH: 32.8 pg (ref 26.0–34.0)
MCHC: 33.6 g/dL (ref 30.0–36.0)
MCV: 97.4 fL (ref 80.0–100.0)
Platelets: 132 10*3/uL — ABNORMAL LOW (ref 150–400)
RBC: 3.45 MIL/uL — ABNORMAL LOW (ref 4.22–5.81)
RDW: 12.6 % (ref 11.5–15.5)
WBC: 10.6 10*3/uL — ABNORMAL HIGH (ref 4.0–10.5)
nRBC: 0 % (ref 0.0–0.2)

## 2019-06-28 LAB — GLUCOSE, CAPILLARY
Glucose-Capillary: 211 mg/dL — ABNORMAL HIGH (ref 70–99)
Glucose-Capillary: 194 mg/dL — ABNORMAL HIGH (ref 70–99)
Glucose-Capillary: 214 mg/dL — ABNORMAL HIGH (ref 70–99)
Glucose-Capillary: 273 mg/dL — ABNORMAL HIGH (ref 70–99)

## 2019-06-28 SURGERY — HEMIARTHROPLASTY, HIP, DIRECT ANTERIOR APPROACH, FOR FRACTURE
Anesthesia: Choice | Site: Hip | Laterality: Left

## 2019-06-28 MED ORDER — STERILE WATER FOR INJECTION IJ SOLN
INTRAMUSCULAR | Status: AC
  Filled 2019-06-28: qty 10

## 2019-06-28 MED ORDER — PROPOFOL 500 MG/50ML IV EMUL
INTRAVENOUS | Status: AC
  Filled 2019-06-28: qty 50

## 2019-06-28 MED ORDER — ONDANSETRON HCL 4 MG/2ML IJ SOLN
4.0000 mg | Freq: Once | INTRAMUSCULAR | Status: AC
  Administered 2019-06-28: 4 mg via INTRAVENOUS

## 2019-06-28 MED ORDER — ACETAMINOPHEN 500 MG PO TABS
ORAL_TABLET | ORAL | Status: AC
  Filled 2019-06-28: qty 2

## 2019-06-28 MED ORDER — SODIUM CHLORIDE 0.9 % IV SOLN: INTRAVENOUS | Status: DC | PRN

## 2019-06-28 MED ORDER — NEOMYCIN-POLYMYXIN B GU 40-200000 IR SOLN
Status: AC
  Filled 2019-06-28: qty 20

## 2019-06-28 MED ORDER — ONDANSETRON HCL 4 MG PO TABS: 4.0000 mg | ORAL_TABLET | Freq: Four times a day (QID) | ORAL | Status: DC | PRN

## 2019-06-28 MED ORDER — FAMOTIDINE IN NACL 20-0.9 MG/50ML-% IV SOLN
20.0000 mg | Freq: Once | INTRAVENOUS | Status: AC
  Administered 2019-06-28: 20 mg via INTRAVENOUS
  Filled 2019-06-28: qty 50

## 2019-06-28 MED ORDER — FENTANYL CITRATE (PF) 100 MCG/2ML IJ SOLN
INTRAMUSCULAR | Status: AC
  Filled 2019-06-28: qty 2

## 2019-06-28 MED ORDER — ACETAMINOPHEN 500 MG PO TABS
ORAL_TABLET | ORAL | Status: AC
  Filled 2019-06-28: qty 1

## 2019-06-28 MED ORDER — PHENYLEPHRINE HCL (PRESSORS) 10 MG/ML IV SOLN
INTRAVENOUS | Status: AC
  Filled 2019-06-28: qty 1

## 2019-06-28 MED ORDER — SODIUM CHLORIDE 0.9 % IV SOLN
INTRAVENOUS | Status: DC | PRN
  Administered 2019-06-28: 40 ug/min via INTRAVENOUS

## 2019-06-28 MED ORDER — FAMOTIDINE IN NACL 20-0.9 MG/50ML-% IV SOLN
20.0000 mg | Freq: Once | INTRAVENOUS | Status: DC
  Filled 2019-06-28: qty 50

## 2019-06-28 MED ORDER — PROPOFOL 10 MG/ML IV BOLUS
INTRAVENOUS | Status: DC | PRN
  Administered 2019-06-28: 20 mg via INTRAVENOUS
  Administered 2019-06-28 (×5): 10 mg via INTRAVENOUS

## 2019-06-28 MED ORDER — INSULIN ASPART 100 UNIT/ML ~~LOC~~ SOLN
SUBCUTANEOUS | Status: AC
  Filled 2019-06-28: qty 1

## 2019-06-28 MED ORDER — ACETAMINOPHEN 10 MG/ML IV SOLN
INTRAVENOUS | Status: AC
  Filled 2019-06-28: qty 100

## 2019-06-28 MED ORDER — FAMOTIDINE 20 MG PO TABS: 20.0000 mg | ORAL_TABLET | Freq: Once | ORAL | Status: DC

## 2019-06-28 MED ORDER — ACETAMINOPHEN 325 MG PO TABS: 325.0000 mg | ORAL_TABLET | Freq: Four times a day (QID) | ORAL | Status: DC | PRN

## 2019-06-28 MED ORDER — OXYCODONE-ACETAMINOPHEN 5-325 MG PO TABS
ORAL_TABLET | ORAL | Status: AC
  Filled 2019-06-28: qty 1

## 2019-06-28 MED ORDER — CEFAZOLIN SODIUM-DEXTROSE 1-4 GM/50ML-% IV SOLN
INTRAVENOUS | Status: AC
  Administered 2019-06-28: 2 g
  Filled 2019-06-28: qty 50

## 2019-06-28 MED ORDER — ONDANSETRON HCL 4 MG/2ML IJ SOLN: 4.0000 mg | Freq: Once | INTRAMUSCULAR | Status: DC | PRN

## 2019-06-28 MED ORDER — BISACODYL 10 MG RE SUPP
10.0000 mg | Freq: Every day | RECTAL | Status: DC | PRN
  Filled 2019-06-28 (×2): qty 1

## 2019-06-28 MED ORDER — DEXAMETHASONE SODIUM PHOSPHATE 10 MG/ML IJ SOLN
INTRAMUSCULAR | Status: DC | PRN
  Administered 2019-06-28: 5 mg via INTRAVENOUS

## 2019-06-28 MED ORDER — ONDANSETRON HCL 4 MG/2ML IJ SOLN
4.0000 mg | Freq: Four times a day (QID) | INTRAMUSCULAR | Status: DC | PRN
  Administered 2019-07-01 – 2019-07-04 (×4): 4 mg via INTRAVENOUS
  Filled 2019-06-28: qty 2

## 2019-06-28 MED ORDER — DIPHENHYDRAMINE HCL 12.5 MG/5ML PO ELIX
12.5000 mg | ORAL_SOLUTION | ORAL | Status: DC | PRN
  Filled 2019-06-28: qty 10

## 2019-06-28 MED ORDER — TRANEXAMIC ACID FOR EPISTAXIS
TOPICAL | Status: DC | PRN
  Administered 2019-06-28: 1000 mg via TOPICAL

## 2019-06-28 MED ORDER — OXYCODONE HCL 5 MG PO TABS: 5.0000 mg | ORAL_TABLET | Freq: Once | ORAL | Status: DC | PRN

## 2019-06-28 MED ORDER — EPHEDRINE SULFATE 50 MG/ML IJ SOLN
INTRAMUSCULAR | Status: DC | PRN
  Administered 2019-06-28 (×5): 10 mg via INTRAVENOUS

## 2019-06-28 MED ORDER — POLYMYXIN B-TRIMETHOPRIM 10000-0.1 UNIT/ML-% OP SOLN
1.0000 [drp] | OPHTHALMIC | Status: AC
  Administered 2019-06-28 – 2019-07-01 (×14): 1 [drp] via OPHTHALMIC
  Filled 2019-06-28: qty 10

## 2019-06-28 MED ORDER — INSULIN ASPART 100 UNIT/ML ~~LOC~~ SOLN
0.0000 [IU] | Freq: Three times a day (TID) | SUBCUTANEOUS | Status: DC
  Administered 2019-06-29: 1 [IU] via SUBCUTANEOUS
  Administered 2019-06-29: 08:00:00 2 [IU] via SUBCUTANEOUS
  Administered 2019-06-29: 17:00:00 5 [IU] via SUBCUTANEOUS
  Administered 2019-06-30 (×3): 2 [IU] via SUBCUTANEOUS
  Administered 2019-06-30: 3 [IU] via SUBCUTANEOUS
  Administered 2019-07-01 (×2): 2 [IU] via SUBCUTANEOUS
  Administered 2019-07-01: 3 [IU] via SUBCUTANEOUS
  Administered 2019-07-01: 2 [IU] via SUBCUTANEOUS
  Administered 2019-07-02: 3 [IU] via SUBCUTANEOUS
  Administered 2019-07-02 (×2): 2 [IU] via SUBCUTANEOUS
  Administered 2019-07-02: 10:00:00 5 [IU] via SUBCUTANEOUS
  Administered 2019-07-03 (×2): 2 [IU] via SUBCUTANEOUS
  Administered 2019-07-03: 3 [IU] via SUBCUTANEOUS
  Administered 2019-07-03 – 2019-07-04 (×4): 2 [IU] via SUBCUTANEOUS
  Administered 2019-07-04: 5 [IU] via SUBCUTANEOUS
  Administered 2019-07-05: 1 [IU] via SUBCUTANEOUS
  Administered 2019-07-05: 3 [IU] via SUBCUTANEOUS
  Filled 2019-06-28 (×22): qty 1

## 2019-06-28 MED ORDER — ACETAMINOPHEN 500 MG PO TABS
1000.0000 mg | ORAL_TABLET | Freq: Four times a day (QID) | ORAL | Status: AC
  Administered 2019-06-28 – 2019-06-29 (×3): 1000 mg via ORAL

## 2019-06-28 MED ORDER — FAMOTIDINE 20 MG PO TABS
ORAL_TABLET | ORAL | Status: AC
  Filled 2019-06-28: qty 1

## 2019-06-28 MED ORDER — PROPOFOL 500 MG/50ML IV EMUL: INTRAVENOUS | Status: DC | PRN

## 2019-06-28 MED ORDER — OXYCODONE HCL 5 MG PO TABS
5.0000 mg | ORAL_TABLET | ORAL | Status: DC | PRN
  Administered 2019-07-04: 5 mg via ORAL
  Filled 2019-06-28: qty 1

## 2019-06-28 MED ORDER — METOCLOPRAMIDE HCL 10 MG PO TABS: 5.0000 mg | ORAL_TABLET | Freq: Three times a day (TID) | ORAL | Status: DC | PRN

## 2019-06-28 MED ORDER — TRANEXAMIC ACID 1000 MG/10ML IV SOLN
INTRAVENOUS | Status: AC
  Filled 2019-06-28: qty 10

## 2019-06-28 MED ORDER — NON FORMULARY: 1.0000 [drp] | Status: DC

## 2019-06-28 MED ORDER — DEXMEDETOMIDINE HCL IN NACL 400 MCG/100ML IV SOLN
INTRAVENOUS | Status: DC | PRN
  Administered 2019-06-28 (×2): 4 ug via INTRAVENOUS
  Administered 2019-06-28: 8 ug via INTRAVENOUS
  Administered 2019-06-28: 4 ug via INTRAVENOUS

## 2019-06-28 MED ORDER — TRAMADOL HCL 50 MG PO TABS: 50.0000 mg | ORAL_TABLET | Freq: Four times a day (QID) | ORAL | Status: DC | PRN

## 2019-06-28 MED ORDER — BUPIVACAINE LIPOSOME 1.3 % IJ SUSP
INTRAMUSCULAR | Status: AC
  Filled 2019-06-28: qty 20

## 2019-06-28 MED ORDER — POLYMYXIN B-TRIMETHOPRIM 10000-0.1 UNIT/ML-% OP SOLN
OPHTHALMIC | Status: AC
  Administered 2019-06-28: 1 [drp] via OPHTHALMIC
  Filled 2019-06-28: qty 10

## 2019-06-28 MED ORDER — FLEET ENEMA 7-19 GM/118ML RE ENEM: 1.0000 | ENEMA | Freq: Once | RECTAL | Status: DC | PRN

## 2019-06-28 MED ORDER — MAGNESIUM HYDROXIDE 400 MG/5ML PO SUSP
30.0000 mL | Freq: Every day | ORAL | Status: DC | PRN
  Filled 2019-06-28: qty 30

## 2019-06-28 MED ORDER — ENOXAPARIN SODIUM 40 MG/0.4ML ~~LOC~~ SOLN: 40.0000 mg | SUBCUTANEOUS | Status: DC

## 2019-06-28 MED ORDER — FENTANYL CITRATE (PF) 100 MCG/2ML IJ SOLN: 25.0000 ug | INTRAMUSCULAR | Status: DC | PRN

## 2019-06-28 MED ORDER — SODIUM CHLORIDE (PF) 0.9 % IJ SOLN
INTRAMUSCULAR | Status: AC
  Filled 2019-06-28: qty 50

## 2019-06-28 MED ORDER — ONDANSETRON HCL 4 MG/2ML IJ SOLN
INTRAMUSCULAR | Status: AC
  Filled 2019-06-28: qty 2

## 2019-06-28 MED ORDER — DOCUSATE SODIUM 100 MG PO CAPS
100.0000 mg | ORAL_CAPSULE | Freq: Two times a day (BID) | ORAL | Status: DC
  Administered 2019-06-28 – 2019-07-05 (×14): 100 mg via ORAL
  Filled 2019-06-28 (×16): qty 1

## 2019-06-28 MED ORDER — BUPIVACAINE HCL (PF) 0.5 % IJ SOLN
INTRAMUSCULAR | Status: DC | PRN
  Administered 2019-06-28: 2.5 mL via INTRATHECAL

## 2019-06-28 MED ORDER — METOCLOPRAMIDE HCL 5 MG/ML IJ SOLN
5.0000 mg | Freq: Three times a day (TID) | INTRAMUSCULAR | Status: DC | PRN
  Administered 2019-07-02: 10 mg via INTRAVENOUS
  Filled 2019-06-28: qty 2

## 2019-06-28 MED ORDER — ENSURE MAX PROTEIN PO LIQD
11.0000 [oz_av] | Freq: Two times a day (BID) | ORAL | Status: DC
  Administered 2019-06-29 – 2019-07-03 (×9): 11 [oz_av] via ORAL
  Filled 2019-06-28: qty 330

## 2019-06-28 MED ORDER — CEPHALEXIN 500 MG PO CAPS
500.0000 mg | ORAL_CAPSULE | Freq: Two times a day (BID) | ORAL | Status: DC
  Administered 2019-06-29 – 2019-06-30 (×4): 500 mg via ORAL
  Filled 2019-06-28 (×5): qty 1

## 2019-06-28 MED ORDER — ACETAMINOPHEN 10 MG/ML IV SOLN: 1000.0000 mg | Freq: Once | INTRAVENOUS | Status: DC | PRN

## 2019-06-28 MED ORDER — PHENYLEPHRINE HCL (PRESSORS) 10 MG/ML IV SOLN
INTRAVENOUS | Status: DC | PRN
  Administered 2019-06-28: 100 ug via INTRAVENOUS

## 2019-06-28 MED ORDER — SODIUM CHLORIDE 0.9 % IV SOLN
INTRAVENOUS | Status: DC
  Administered 2019-06-28: 75 mL/h via INTRAVENOUS

## 2019-06-28 MED ORDER — OXYCODONE HCL 5 MG/5ML PO SOLN: 5.0000 mg | Freq: Once | ORAL | Status: DC | PRN

## 2019-06-28 MED ORDER — INSULIN ASPART 100 UNIT/ML ~~LOC~~ SOLN
SUBCUTANEOUS | Status: AC
  Administered 2019-06-28: 5 [IU] via SUBCUTANEOUS
  Filled 2019-06-28: qty 1

## 2019-06-28 MED ORDER — CEFAZOLIN SODIUM-DEXTROSE 2-4 GM/100ML-% IV SOLN
INTRAVENOUS | Status: AC
  Administered 2019-06-28: 2 g via INTRAVENOUS
  Filled 2019-06-28: qty 100

## 2019-06-28 MED ORDER — EPINEPHRINE PF 1 MG/ML IJ SOLN
INTRAMUSCULAR | Status: AC
  Filled 2019-06-28: qty 1

## 2019-06-28 MED ORDER — ACETAMINOPHEN 10 MG/ML IV SOLN
INTRAVENOUS | Status: DC | PRN
  Administered 2019-06-28: 1000 mg via INTRAVENOUS

## 2019-06-28 MED ORDER — CEFAZOLIN SODIUM-DEXTROSE 2-4 GM/100ML-% IV SOLN
2.0000 g | Freq: Four times a day (QID) | INTRAVENOUS | Status: AC
  Administered 2019-06-28: 2 g via INTRAVENOUS

## 2019-06-28 MED ORDER — BUPIVACAINE HCL (PF) 0.5 % IJ SOLN
INTRAMUSCULAR | Status: AC
  Filled 2019-06-28: qty 30

## 2019-06-28 MED ORDER — DEXAMETHASONE SODIUM PHOSPHATE 10 MG/ML IJ SOLN
INTRAMUSCULAR | Status: AC
  Filled 2019-06-28: qty 1

## 2019-06-28 MED ORDER — PROPOFOL 500 MG/50ML IV EMUL
INTRAVENOUS | Status: DC | PRN
  Administered 2019-06-28: 80 ug/kg/min via INTRAVENOUS

## 2019-06-28 MED ORDER — BUPIVACAINE LIPOSOME 1.3 % IJ SUSP
INTRAMUSCULAR | Status: DC | PRN
  Administered 2019-06-28: 50 mL

## 2019-06-28 MED ORDER — CEFAZOLIN SODIUM-DEXTROSE 2-4 GM/100ML-% IV SOLN
INTRAVENOUS | Status: AC
  Filled 2019-06-28: qty 100

## 2019-06-28 MED ORDER — EPHEDRINE 5 MG/ML INJ
INTRAVENOUS | Status: AC
  Filled 2019-06-28: qty 10

## 2019-06-28 SURGICAL SUPPLY — 68 items
BLADE SAW SGTL 13X75X1.27 (BLADE) ×2 IMPLANT
BLADE SURG SZ10 CARB STEEL (BLADE) ×2 IMPLANT
BNDG COHESIVE 4X5 TAN STRL (GAUZE/BANDAGES/DRESSINGS) ×1 IMPLANT
CANISTER SUCT 1200ML W/VALVE (MISCELLANEOUS) ×2 IMPLANT
CANISTER SUCT 3000ML PPV (MISCELLANEOUS) ×4 IMPLANT
CHLORAPREP W/TINT 26 (MISCELLANEOUS) ×2 IMPLANT
COVER BACK TABLE REUSABLE LG (DRAPES) ×2 IMPLANT
COVER WAND RF STERILE (DRAPES) ×2 IMPLANT
DERMABOND ADVANCED (GAUZE/BANDAGES/DRESSINGS)
DERMABOND ADVANCED .7 DNX12 (GAUZE/BANDAGES/DRESSINGS) ×1 IMPLANT
DRAPE 3/4 80X56 (DRAPES) ×6 IMPLANT
DRAPE IMP U-DRAPE 54X76 (DRAPES) ×1 IMPLANT
DRAPE INCISE IOBAN 66X60 STRL (DRAPES) ×2 IMPLANT
DRAPE SPLIT 6X30 W/TAPE (DRAPES) ×2 IMPLANT
DRAPE SURG 17X11 SM STRL (DRAPES) ×2 IMPLANT
DRSG OPSITE POSTOP 4X10 (GAUZE/BANDAGES/DRESSINGS) ×2 IMPLANT
DRSG OPSITE POSTOP 4X8 (GAUZE/BANDAGES/DRESSINGS) ×1 IMPLANT
ELECT BLADE 6.5 EXT (BLADE) ×2 IMPLANT
ELECT CAUTERY BLADE 6.4 (BLADE) ×2 IMPLANT
ELECT REM PT RETURN 9FT ADLT (ELECTROSURGICAL) ×2
ELECTRODE REM PT RTRN 9FT ADLT (ELECTROSURGICAL) ×1 IMPLANT
GAUZE SPONGE 4X4 12PLY STRL (GAUZE/BANDAGES/DRESSINGS) ×2 IMPLANT
GAUZE XEROFORM 1X8 LF (GAUZE/BANDAGES/DRESSINGS) ×1 IMPLANT
GLOVE BIOGEL PI IND STRL 8 (GLOVE) ×2 IMPLANT
GLOVE BIOGEL PI INDICATOR 8 (GLOVE) ×2
GLOVE SURG ORTHO 8.0 STRL STRW (GLOVE) ×4 IMPLANT
GOWN STRL REUS W/ TWL LRG LVL3 (GOWN DISPOSABLE) ×1 IMPLANT
GOWN STRL REUS W/ TWL XL LVL3 (GOWN DISPOSABLE) ×1 IMPLANT
GOWN STRL REUS W/TWL LRG LVL3 (GOWN DISPOSABLE) ×1
GOWN STRL REUS W/TWL XL LVL3 (GOWN DISPOSABLE) ×1
HEAD ENDO II MOD SZ 52 (Orthopedic Implant) ×1 IMPLANT
HEMOVAC 400ML (MISCELLANEOUS)
INSERT TAPER ENDO II +3 (Orthopedic Implant) ×1 IMPLANT
KIT DRAIN HEMOVAC JP 7FR 400ML (MISCELLANEOUS) IMPLANT
KIT TURNOVER KIT A (KITS) ×2 IMPLANT
NDL FILTER BLUNT 18X1 1/2 (NEEDLE) ×1 IMPLANT
NDL MAYO CATGUT SZ4 TPR NDL (NEEDLE) ×1 IMPLANT
NDL SAFETY ECLIPSE 18X1.5 (NEEDLE) ×1 IMPLANT
NEEDLE FILTER BLUNT 18X 1/2SAF (NEEDLE) ×1
NEEDLE FILTER BLUNT 18X1 1/2 (NEEDLE) ×1 IMPLANT
NEEDLE HYPO 18GX1.5 SHARP (NEEDLE) ×1
NEEDLE HYPO 22GX1.5 SAFETY (NEEDLE) ×2 IMPLANT
NEEDLE MAYO CATGUT SZ4 (NEEDLE) ×2 IMPLANT
NEPTUNE MANIFOLD (MISCELLANEOUS) ×1 IMPLANT
NS IRRIG 1000ML POUR BTL (IV SOLUTION) ×2 IMPLANT
PACK HIP PROSTHESIS (MISCELLANEOUS) ×2 IMPLANT
PENCIL SMOKE ULTRAEVAC 22 CON (MISCELLANEOUS) ×1 IMPLANT
PILLOW ABDUCTION FOAM SM (MISCELLANEOUS) ×2 IMPLANT
PULSAVAC PLUS IRRIG FAN TIP (DISPOSABLE)
RETRIEVER SUT HEWSON (MISCELLANEOUS) IMPLANT
SOL .9 NS 3000ML IRR  AL (IV SOLUTION) ×1
SOL .9 NS 3000ML IRR UROMATIC (IV SOLUTION) ×1 IMPLANT
STAPLER SKIN PROX 35W (STAPLE) ×2 IMPLANT
STEM COLLARLESS RED 12X120X130 (Stem) ×1 IMPLANT
SUT ETHIBOND #5 BRAIDED 30INL (SUTURE) ×2 IMPLANT
SUT MNCRL 4-0 (SUTURE) ×1
SUT MNCRL 4-0 27XMFL (SUTURE) ×1
SUT VIC AB 0 CT1 36 (SUTURE) ×2 IMPLANT
SUT VIC AB 2-0 CT2 27 (SUTURE) ×4 IMPLANT
SUTURE MNCRL 4-0 27XMF (SUTURE) ×1 IMPLANT
SYR 20ML LL LF (SYRINGE) ×2 IMPLANT
SYR 50ML LL SCALE MARK (SYRINGE) ×2 IMPLANT
TAPE MICROFOAM 4IN (TAPE) ×1 IMPLANT
TAPE TRANSPORE STRL 2 31045 (GAUZE/BANDAGES/DRESSINGS) ×1 IMPLANT
TIP BRUSH PULSAVAC PLUS 24.33 (MISCELLANEOUS) ×2 IMPLANT
TIP FAN IRRIG PULSAVAC PLUS (DISPOSABLE) ×1 IMPLANT
TUBE KAMVAC SUCTION (TUBING) ×1 IMPLANT
TUBE SUCT KAM VAC (TUBING) ×1 IMPLANT

## 2019-06-28 NOTE — Progress Notes (Signed)
Pt. Suddenly vomits four times. Contacted Dr. Bertell Maria and received orders. Pt. Quit vomiting after meds given.

## 2019-06-28 NOTE — Op Note (Signed)
06/27/2019 - 06/28/2019  1:51 PM  Patient:   Cody Howard  Pre-Op Diagnosis:   Displaced femoral neck fracture, left hip.  Post-Op Diagnosis:   Same.  Procedure:   Left hip unipolar hemiarthroplasty.  Surgeon:   Pascal Lux, MD  Assistant:   Cameron Proud, PA-C  Anesthesia:   Spinal  Findings:   As above.  Complications:   None  EBL:   150 cc  Fluids:   1000 cc crystalloid  UOP:   None  TT:   None  Drains:   None  Closure:   Staples  Implants:   Biomet press-fit system with a #12 laterally offset reduced proximal profile Echo femoral stem, a 52 mm outer diameter shell, and a +3 mm neck adapter.  Brief Clinical Note:   The patient is an 85 year old male who sustained the above-noted injury yesterday when he apparently tripped and fell while going outside to get his mail. The patient presented to the emergency room where x-rays demonstrated the above-noted injury. The patient has been cleared medically and presents at this time for definitive management of the injury.  Procedure:   The patient was brought into the operating room. After adequate spinal anesthesia was obtained, the patient was repositioned in the right lateral decubitus position and secured using a lateral hip positioner. The left hip and lower extremity were prepped with ChloroPrep solution before being draped sterilely. Preoperative antibiotics were administered. A timeout was performed to verify the appropriate surgical site before a standard posterior approach to the hip was made through an approximately 4-5 inch incision. The incision was carried down through the subcutaneous tissues to expose the gluteal fascia and proximal end of the iliotibial band. These structures were split the length of the incision and the Charnley self-retaining hip retractor placed. The bursal tissues were swept posteriorly to expose the short external rotators. The anterior border of the piriformis tendon was identified and this  plane developed down through the capsule to enter the joint. Abundant fracture hematoma was suctioned. A flap of tissue was elevated off the posterior aspect of the femoral neck and greater trochanter and retracted posteriorly. This flap included the piriformis tendon, the short external rotators, and the posterior capsule. The femoral head was removed in its entirety, then taken to the back table where it was measured and found to be optimally replicated by a 52 mm head. The appropriate trial head was inserted and found to demonstrate an excellent suction fit.   Attention was directed to the femoral side. The femoral neck was recut 10-12 mm above the lesser trochanter using an oscillating saw. The piriformis fossa was debrided of soft tissues before the intramedullary canal was accessed through this point using a triple step reamer. The canal was reamed sequentially beginning with a #7 tapered reamer and progressing to a #12 tapered reamer. This provided excellent circumferential chatter. A box osteotome was used to establish version before the canal was broached sequentially beginning with a #10 broach and progressing to a #12 broach. This was left in place and several trial reductions performed. The permanent #12 laterally offset reduced proximal profile femoral stem was impacted into place. A repeat trial reduction was performed using both the +0 mm and +3 mm neck lengths. The +3 mm neck length demonstrated excellent stability both in extension and external rotation as well as with flexion to 90 and internal rotation beyond 70. It also was stable in the position of sleep. The 52 mm outer diameter  shell with the +3 mm neck adapter construct was put together on the back table before being impacted onto the stem of the femoral component. The Morse taper locking mechanism was verified using manual distraction before the head was relocated and the hip placed through a range of motion with the findings as  described above.  The wound was copiously irrigated with bacitracin saline solution via the jet lavage system before the peri-incisional and pericapsular tissues were injected with 30 cc of 0.5% Sensorcaine with epinephrine and 20 cc of Exparel diluted out to 60 cc with normal saline to help with postoperative analgesia. The posterior flap was reapproximated to the posterior aspect of the greater trochanter using #2 Tycron interrupted sutures placed through drill holes. The iliotibial band was reapproximated using #1 Vicryl interrupted sutures before the gluteal fascia was closed using a running #1 Vicryl suture. At this point, 1 g of transexemic acid in 10 cc of normal saline was injected into the joint to help reduce postoperative bleeding. The subcutaneous tissues were closed in several layers using 2-0 Vicryl interrupted sutures before the skin was closed using staples. A sterile occlusive dressing was applied to the wound . The patient then was rolled back into the supine position on the hospital bed before being awakened and returned to the recovery room in satisfactory condition after tolerating the procedure well.

## 2019-06-28 NOTE — Transfer of Care (Signed)
Immediate Anesthesia Transfer of Care Note  Patient: Cody Howard  Procedure(s) Performed: ARTHROPLASTY BIPOLAR HIP (HEMIARTHROPLASTY) (Left Hip)  Patient Location: PACU  Anesthesia Type:Spinal  Level of Consciousness: awake, alert  and oriented  Airway & Oxygen Therapy: Patient Spontanous Breathing and Patient connected to face mask oxygen  Post-op Assessment: Report given to RN and Post -op Vital signs reviewed and stable  Post vital signs: Reviewed and stable  Last Vitals:  Vitals Value Taken Time  BP 115/94 06/28/19 1408  Temp 36.8 C 06/28/19 1408  Pulse 66 06/28/19 1410  Resp 15 06/28/19 1410  SpO2 99 % 06/28/19 1410  Vitals shown include unvalidated device data.  Last Pain:  Vitals:   06/28/19 1121  TempSrc:   PainSc: 0-No pain         Complications: No apparent anesthesia complications

## 2019-06-28 NOTE — Anesthesia Procedure Notes (Signed)
Spinal  Patient location during procedure: OR Start time: 06/28/2019 12:08 PM End time: 06/28/2019 12:14 PM Staffing Performed: anesthesiologist  Anesthesiologist: Alphonsus Sias, MD Preanesthetic Checklist Completed: patient identified, IV checked, site marked, risks and benefits discussed, surgical consent, monitors and equipment checked, pre-op evaluation and timeout performed Spinal Block Patient position: left lateral decubitus Prep: DuraPrep Patient monitoring: heart rate, cardiac monitor, continuous pulse ox and blood pressure Approach: right paramedian Location: L3-4 Injection technique: single-shot Needle Needle type: Pencil-Tip and Pencan  Needle gauge: 24 G Needle length: 10 cm Needle insertion depth: 7 cm Assessment Sensory level: T8 Additional Notes Midline attempt at L2/3, retry paramedian at L3/4, clear CSF on 1 st pass. Checked x 3 aspirate. Tol well

## 2019-06-28 NOTE — H&P (Signed)
The patient's history and physical and Dr. Serita Grit consult note have been reviewed and patient re-examined. No changes.  I have reviewed the procedure with the patient and his wife.  They again state their understanding and wished to proceed.  They are comfortable having me do this procedure rather than Dr. Posey Pronto, whom they met yesterday.  I have explained to them that I offered to do this for Dr. Posey Pronto because I have the time and the patient can have his surgery completed in a more expedient fashion rather than waiting for Dr. Posey Pronto to do this later.  They express their appreciation to get the surgery completed sooner than later.

## 2019-06-28 NOTE — OR Nursing (Signed)
Pt. C/o eye redness with watering. Pt. Stated he thought he had "something in it". Called DR. Ramsdale who came to bedside. Upon questioning family the family advised he was being treated for an eye infection. Dr. Willette Pa contacted pharmacy to get permission to use home medications. Awaiting approval from pharmacy.

## 2019-06-28 NOTE — Progress Notes (Addendum)
Pt. Has been NPO and early morning blood sugar revealed so no AC check performed. Insulin held for surgery.

## 2019-06-28 NOTE — Progress Notes (Signed)
Pt. Departed in NAD with VSS.

## 2019-06-28 NOTE — Progress Notes (Signed)
PROGRESS NOTE    Cody Howard  PFX:[id number] DOB: 09-18-1934 DOA: 06/27/2019 PCP: [name], MD    Brief Narrative:  HPI: Cody Howard is a 85 y.o. male with medical history significant of hypertension, hyperlipidemia, diabetes mellitus, TIA, depression, atrial fibrillation not on anticoagulants, CKD stage IIIa, sCHF with EF of 45%, who presents with fall and left hip pain.  Pt states that fell on the side way of his home when he was walking outside to get the paper. He injured his left hip, causing severe pain in left hip. No loss of consciousness.  Strongly denies any head or neck injury.  Refused CT of head and neck.  Patient does not have chest pain, shortness breath, cough.  No nausea vomiting, diarrhea, abdominal pain, symptoms of UTI or unilateral weakness. Of note, pt had laceration to right palm on 4/10,which was sutured up and healing well.   4/22: Seen and examined in postanesthesia care unit. Hemodynamically stable. Little sleepy but easily awakened. Denies pain. Status post hemiarthroplasty with orthopedic surgery for a left hip displaced femoral neck fracture   Assessment & Plan:   Principal Problem:   Closed displaced fracture of left femoral neck (HCC) Active Problems:   Type II diabetes mellitus with renal manifestations (HCC)   CKD (chronic kidney disease) stage 3, GFR 30-59 ml/min   Hyperlipidemia, mixed   Chronic systolic CHF (congestive heart failure), NYHA class 3 (HCC)   TIA (transient ischemic attack)   Essential hypertension   Atrial fibrillation, chronic ([place])   Depression   Fall at home, initial encounter  Closed displaced fracture of left femoral neck ([place])  As evidenced by x-ray.  POD#0 s/p hemiarthroplasty with Dr. Hyman Bower procedure well Plan: Multimodal pain control As needed nausea control PT OT on postop day #1 Start chemoprophylaxis on postop day #1 Anticipate need for rehab placement  Type II diabetes mellitus with  renal manifestations ([place])  Most recent A1c 6.6, well controled.  Patient is taking Metformin at home Home metformin on hold -SSI  CKD (chronic kidney disease) stage 3a, GFR 30-59 ml/min  Renal function is close to baseline.   Recent baseline creatinine 2.0-2.1.   -f/y by BMP  Hyperlipidemia, mixed: -statin  Chronic systolic CHF (congestive heart failure), NYHA class 3 ([place])  2D echo on 03/05/2019 showed EF of 45%.   Patient does not have leg edema JVD.  No shortness of breath.  CHF is compensated.   BNP 27 -Continue home dose Lasix and Coreg  TIA (transient ischemic attack): -statin  HTN:  -Continue home medications: Cozaar, Coreg -Patient is also on Lasix -hydralazine prn  Atrial fibrillation, chronic ([place]): Not on anticoagulants.  Heart rate 83 -Cardiac monitor -Continue Coreg  Depression -Continue Zoloft  Fall at home, initial encounter -PT/OT when able to   DVT prophylaxis: SCDs Code Status: Full Family Communication: None today Disposition Plan:Status is: Inpatient  Remains inpatient appropriate because:Inpatient level of care appropriate due to severity of illness   Dispo: The patient is from: Home              Anticipated d/c is to: SNF              Anticipated d/c date is: 2 days              Patient currently is not medically stable to d/c. Currently postop day #0 status post left hip hemiarthroplasty. Physical therapy and Occupational Therapy to be involved tomorrow. Chemoprophylaxis tubes start tomorrow.  Anticipate need for skilled nursing facility placement.       Consultants:   Orthopedics  Procedures:   Left hip hemiarthroplasty-06/28/2019  Antimicrobials:   None   Subjective: Seen and examined in PACU No complaints Vital stable  Objective: Vitals:   06/28/19 1454 06/28/19 1455 06/28/19 1505 06/28/19 1517  BP: 127/65 127/65 139/60 (!) 141/59  Pulse: 64 62 61 62  Resp: 16 (!) 7 17 19   Temp:    98.1 F (36.7 C)   TempSrc:      SpO2: 98% 95% 98% 100%  Weight:      Height:        Intake/Output Summary (Last 24 hours) at 06/28/2019 1520 Last data filed at 06/28/2019 1519 Gross per 24 hour  Intake 1350 ml  Output 1000 ml  Net 350 ml   Filed Weights   06/27/19 0950  Weight: 88.5 kg    Examination:  General exam: Appears calm and comfortable  Respiratory system: Clear to auscultation. Respiratory effort normal. Cardiovascular system: S1 & S2 heard, RRR. No JVD, murmurs, rubs, gallops or clicks. No pedal edema. Gastrointestinal system: Abdomen is nondistended, soft and nontender. No organomegaly or masses felt. Normal bowel sounds heard. Central nervous system: Alert and oriented. No focal neurological deficits. Extremities: Lower extremities in immobilization and surgical dressing, not removed Skin: No rashes, lesions or ulcers Psychiatry: Judgement and insight appear normal. Mood & affect appropriate.     Data Reviewed: I have personally reviewed following labs and imaging studies  CBC: Recent Labs  Lab 06/27/19 1118 06/28/19 0559  WBC 9.4 10.6*  NEUTROABS 7.2  --   HGB 12.2* 11.3*  HCT 36.2* 33.6*  MCV 99.7 97.4  PLT 138* 875*   Basic Metabolic Panel: Recent Labs  Lab 06/27/19 1118 06/28/19 0559  NA 139 137  K 4.9 5.2*  CL 109 105  CO2 23 24  GLUCOSE 188* 219*  BUN 30* 32*  CREATININE 2.13* 2.04*  CALCIUM 8.9 8.7*   GFR: Estimated Creatinine Clearance: 29.7 mL/min (A) (by C-G formula based on SCr of 2.04 mg/dL (H)). Liver Function Tests: No results for input(s): AST, ALT, ALKPHOS, BILITOT, PROT, ALBUMIN in the last 168 hours. No results for input(s): LIPASE, AMYLASE in the last 168 hours. No results for input(s): AMMONIA in the last 168 hours. Coagulation Profile: Recent Labs  Lab 06/27/19 1118  INR 0.9   Cardiac Enzymes: No results for input(s): CKTOTAL, CKMB, CKMBINDEX, TROPONINI in the last 168 hours. BNP (last 3 results) No results for input(s): PROBNP  in the last 8760 hours. HbA1C: No results for input(s): HGBA1C in the last 72 hours. CBG: Recent Labs  Lab 06/27/19 1525 06/27/19 1625 06/27/19 2209 06/28/19 1024 06/28/19 1411  GLUCAP 154* 206* 235* 211* 194*   Lipid Profile: No results for input(s): CHOL, HDL, LDLCALC, TRIG, CHOLHDL, LDLDIRECT in the last 72 hours. Thyroid Function Tests: No results for input(s): TSH, T4TOTAL, FREET4, T3FREE, THYROIDAB in the last 72 hours. Anemia Panel: No results for input(s): VITAMINB12, FOLATE, FERRITIN, TIBC, IRON, RETICCTPCT in the last 72 hours. Sepsis Labs: No results for input(s): PROCALCITON, LATICACIDVEN in the last 168 hours.  Recent Results (from the past 240 hour(s))  Respiratory Panel by RT PCR (Flu A&B, Covid) - Nasopharyngeal Swab     Status: None   Collection Time: 06/27/19 11:54 AM   Specimen: Nasopharyngeal Swab  Result Value Ref Range Status   SARS Coronavirus 2 by RT PCR NEGATIVE NEGATIVE Final    Comment: (NOTE) SARS-CoV-2  target nucleic acids are NOT DETECTED. The SARS-CoV-2 RNA is generally detectable in upper respiratoy specimens during the acute phase of infection. The lowest concentration of SARS-CoV-2 viral copies this assay can detect is 131 copies/mL. A negative result does not preclude SARS-Cov-2 infection and should not be used as the sole basis for treatment or other patient management decisions. A negative result may occur with  improper specimen collection/handling, submission of specimen other than nasopharyngeal swab, presence of viral mutation(s) within the areas targeted by this assay, and inadequate number of viral copies (<131 copies/mL). A negative result must be combined with clinical observations, patient history, and epidemiological information. The expected result is Negative. Fact Sheet for Patients:  PinkCheek.be Fact Sheet for Healthcare Providers:  GravelBags.it This test is not  yet ap proved or cleared by the [place] FDA and  has been authorized for detection and/or diagnosis of SARS-CoV-2 by FDA under an Emergency Use Authorization (EUA). This EUA will remain  in effect (meaning this test can be used) for the duration of the COVID-19 declaration under Section 564(b)(1) of the Act, 21 U.S.C. section 360bbb-3(b)(1), unless the authorization is terminated or revoked sooner.    Influenza A by PCR NEGATIVE NEGATIVE Final   Influenza B by PCR NEGATIVE NEGATIVE Final    Comment: (NOTE) The Xpert Xpress SARS-CoV-2/FLU/RSV assay is intended as an aid in  the diagnosis of influenza from Nasopharyngeal swab specimens and  should not be used as a sole basis for treatment. Nasal washings and  aspirates are unacceptable for Xpert Xpress SARS-CoV-2/FLU/RSV  testing. Fact Sheet for Patients: PinkCheek.be Fact Sheet for Healthcare Providers: GravelBags.it This test is not yet approved or cleared by the [place] FDA and  has been authorized for detection and/or diagnosis of SARS-CoV-2 by  FDA under an Emergency Use Authorization (EUA). This EUA will remain  in effect (meaning this test can be used) for the duration of the  Covid-19 declaration under Section 564(b)(1) of the Act, 21  U.S.C. section 360bbb-3(b)(1), unless the authorization is  terminated or revoked. Performed at [facility], [address] [place], [place] [address]   Surgical pcr screen     Status: None   Collection Time: 06/27/19  5:41 PM   Specimen: Nasal Mucosa; Nasal Swab  Result Value Ref Range Status   MRSA, PCR NEGATIVE NEGATIVE Final   Staphylococcus aureus NEGATIVE NEGATIVE Final    Comment: (NOTE) The Xpert SA Assay (FDA approved for NASAL specimens in patients 17 years of age and older), is one component of a comprehensive surveillance program. It is not intended to diagnose infection nor to guide or monitor  treatment. Performed at [facility], [address] [place], [place] [address]          Radiology Studies: DG Chest 1 View  Result Date: 06/27/2019 CLINICAL DATA:  Fall with left hip fracture. EXAM: CHEST  1 VIEW COMPARISON:  06/28/2016 FINDINGS: Cardiac silhouette is normal in size. No mediastinal or hilar masses. No evidence of adenopathy. Clear lungs.  No pleural effusion or pneumothorax. Skeletal structures are grossly intact. IMPRESSION: No active disease. Electronically Signed   By: Lajean Manes M.D.   On: 06/27/2019 11:13   DG Hip Unilat With Pelvis 2-3 Views Left  Result Date: 06/27/2019 CLINICAL DATA:  Fall.  Left hip pain. EXAM: DG HIP (WITH OR WITHOUT PELVIS) 2-3V LEFT COMPARISON:  None. FINDINGS: Non comminuted mid left femoral neck fracture. Distal fracture component has mildly displaced superiorly by approximately  1.5 cm. There is mild varus angulation as well as apex anterior angulation. No other fractures.  No bone lesions. Hip joints, SI joints and symphysis pubis are normally aligned. Soft tissues are unremarkable. IMPRESSION: 1. Non comminuted, mildly displaced and angulated left femoral neck fracture. No dislocation. Electronically Signed   By: Lajean Manes M.D.   On: 06/27/2019 11:12        Scheduled Meds: . [MAR Hold] atorvastatin  40 mg Oral Daily  . [MAR Hold] carvedilol  3.125 mg Oral BID WC  . [MAR Hold] donepezil  10 mg Oral Daily  . [MAR Hold] famotidine  20 mg Oral Once  . [MAR Hold] furosemide  20 mg Oral Daily  . [MAR Hold] insulin aspart  0-5 Units Subcutaneous QHS  . [MAR Hold] insulin aspart  0-9 Units Subcutaneous TID WC  . [MAR Hold] losartan  50 mg Oral Daily  . [MAR Hold] melatonin  5 mg Oral QHS  . [MAR Hold] omega-3 acid ethyl esters  1 g Oral Daily  . ondansetron      . [START ON 06/29/2019] Ensure Max Protein  11 oz Oral BID BM  . [MAR Hold] sertraline  25 mg Oral Daily  . sodium chloride flush  10 mL Intravenous Q6H  .  [MAR Hold] tamsulosin  0.4 mg Oral Daily   Continuous Infusions: . sodium chloride    . acetaminophen    . [MAR Hold]  ceFAZolin (ANCEF) IV    . famotidine (PEPCID) IV Stopped (06/28/19 1045)     LOS: 1 day    Time spent: 35 min    Sidney Ace, MD [facility] Pager [phone]  If 7PM-7AM, please contact night-coverage 06/28/2019, 3:20 PM

## 2019-06-28 NOTE — OR Nursing (Signed)
Pt. Care released to [facility]. Pt. In NAD with VSS.

## 2019-06-28 NOTE — Progress Notes (Signed)
Initial Nutrition Assessment  DOCUMENTATION CODES:   Not applicable  INTERVENTION:  Once diet is advanced provide Ensure Max Protein po BID, each supplement provides 150 kcal and 30 grams of protein.  NUTRITION DIAGNOSIS:   Increased nutrient needs related to post-op healing as evidenced by estimated needs.  GOAL:   Patient will meet greater than or equal to 90% of their needs  MONITOR:   Diet advancement, PO intake, Supplement acceptance, Labs, Weight trends, I & O's, Skin  REASON FOR ASSESSMENT:   Malnutrition Screening Tool, Consult Assessment of nutrition requirement/status  ASSESSMENT:   85 year old male with PMHx of DM, CKD, HLD, arthritis, A-fib, HTN, CHF, TIA admitted after a fall with closed displaced fracture of left femoral neck.   Unable to meet with patient at bedside. It appears he was taken from ED to perioperative area yesterday and is now in pre-op bed. He is NPO for planned hemiarthroplasty today. Will monitor for adequacy of intake with diet advancement. Patient will have increased needs for post-op healing.  According to chart patient has been 85-88 kg the past year. He is currently documented to be 88.5 kg (195 lbs).  Medications reviewed and include: famotidine, Lasix 20 mg daily, Novolog 0-9 units TID, Novolog 0-5 units QHS.  Labs reviewed: CBG 206-235, Potassium 5.2, BUN 32, Creatinine 2.04.  Unable to determine if patient meets criteria for malnutrition at this time.  NUTRITION - FOCUSED PHYSICAL EXAM:  Unable to complete at this time.  Diet Order:   Diet Order            Diet NPO time specified Except for: Ice Chips, Sips with Meds  Diet effective midnight             EDUCATION NEEDS:   No education needs have been identified at this time  Skin:  Skin Assessment: Reviewed RN Assessment  Last BM:  Unknown  Height:   Ht Readings from Last 1 Encounters:  06/27/19 5\' 11"  (1.803 m)   Weight:   Wt Readings from Last 1 Encounters:   06/27/19 88.5 kg   BMI:  Body mass index is 27.2 kg/m.  Estimated Nutritional Needs:   Kcal:  2000-2200  Protein:  100-110 grams  Fluid:  1.8-2 L/day  Jacklynn Barnacle, MS, RD, LDN Pager number available on Amion

## 2019-06-28 NOTE — Progress Notes (Signed)
Bladder scan 358 at 14:14    Cath done with yellow urine

## 2019-06-28 NOTE — Anesthesia Preprocedure Evaluation (Addendum)
Anesthesia Evaluation  Patient identified by MRN, date of birth, ID band Patient awake    Reviewed: Allergy & Precautions, H&P , NPO status , Patient's Chart, lab work & pertinent test results, reviewed documented beta blocker date and time   History of Anesthesia Complications Negative for: history of anesthetic complications  Airway Mallampati: II   Neck ROM: full    Dental  (+) Poor Dentition, Teeth Intact, Dental Advisory Given   Pulmonary neg pulmonary ROS, neg sleep apnea, neg COPD, Patient abstained from smoking.Not current smoker, former smoker,    Pulmonary exam normal breath sounds clear to auscultation       Cardiovascular Exercise Tolerance: Good METShypertension, + Past MI and +CHF  (-) CAD Normal cardiovascular exam+ dysrhythmias Atrial Fibrillation + Valvular Problems/Murmurs MR  Rhythm:regular Rate:Normal - Systolic murmurs Hx Afib not on anticoagulation  TTE 5035: MILD LV SYSTOLIC DYSFUNCTION 46% (See above) NORMAL RIGHT VENTRICULAR SYSTOLIC FUNCTION MODERATE mitral VALVULAR REGURGITATION (See above) NO VALVULAR STENOSIS   Neuro/Psych PSYCHIATRIC DISORDERS Depression TIA   GI/Hepatic Neg liver ROS, hiatal hernia, neg GERD  ,  Endo/Other  diabetes, Well Controlled, Type 2, Oral Hypoglycemic Agents  Renal/GU CRFRenal disease  negative genitourinary   Musculoskeletal  (+) Arthritis , Osteoarthritis,    Abdominal   Peds  Hematology negative hematology ROS (+)   Anesthesia Other Findings Past Medical History: No date: AF (atrial fibrillation) (HCC) No date: Arthritis     Comment:  hands No date: Chronic kidney disease No date: Diabetes mellitus without complication (HCC) No date: Hyperlipidemia No date: Prostate enlargement Past Surgical History: 02/26/2015: COLONOSCOPY; N/A     Comment:  Procedure: COLONOSCOPY;  Surgeon: Hulen Luster, MD;                Location: [place];  Service:               Gastroenterology;  Laterality: N/A; 02/26/2015: ESOPHAGOGASTRODUODENOSCOPY; N/A     Comment:  Procedure: ESOPHAGOGASTRODUODENOSCOPY (EGD);  Surgeon:               Hulen Luster, MD;  Location: [place];  Service:               Gastroenterology;  Laterality: N/A;  Diabetic - oral meds No date: KIDNEY SURGERY; Right     Comment:  surgery when 85 years old 02/26/2015: POLYPECTOMY     Comment:  Procedure: POLYPECTOMY;  Surgeon: Hulen Luster, MD;                Location: [place];  Service:               Gastroenterology;; No date: sebaceous cyst removal; Right     Comment:  located right of spine on upper back   Reproductive/Obstetrics negative OB ROS                            Anesthesia Physical  Anesthesia Plan  ASA: III  Anesthesia Plan: General/Spinal   Post-op Pain Management:    Induction: Intravenous  PONV Risk Score and Plan: 3 and Ondansetron, Dexamethasone, Propofol infusion, TIVA and Midazolam  Airway Management Planned: Natural Airway  Additional Equipment: None  Intra-op Plan:   Post-operative Plan:   Informed Consent: I have reviewed the patients History and Physical, chart, labs and discussed the procedure including the risks, benefits and alternatives for the proposed anesthesia with the patient or authorized representative who  has indicated his/her understanding and acceptance.     Dental Advisory Given  Plan Discussed with: CRNA and Surgeon  Anesthesia Plan Comments: (Discussed R/B/A of neuraxial anesthesia technique with patient: - rare risks of spinal/epidural hematoma, nerve damage, infection - Risk of PDPH - Risk of nausea and vomiting - Risk of conversion to general anesthesia and its associated risks, including sore throat, damage to lips/teeth/oropharynx, and rare risks such as cardiac and respiratory events.  Patient voiced understanding.)       Anesthesia Quick Evaluation

## 2019-06-29 LAB — GLUCOSE, CAPILLARY
Glucose-Capillary: 196 mg/dL — ABNORMAL HIGH (ref 70–99)
Glucose-Capillary: 190 mg/dL — ABNORMAL HIGH (ref 70–99)
Glucose-Capillary: 260 mg/dL — ABNORMAL HIGH (ref 70–99)
Glucose-Capillary: 131 mg/dL — ABNORMAL HIGH (ref 70–99)

## 2019-06-29 LAB — CBC
HCT: 28.5 % — ABNORMAL LOW (ref 39.0–52.0)
Hemoglobin: 9.7 g/dL — ABNORMAL LOW (ref 13.0–17.0)
MCH: 33.8 pg (ref 26.0–34.0)
MCHC: 34 g/dL (ref 30.0–36.0)
MCV: 99.3 fL (ref 80.0–100.0)
Platelets: 122 10*3/uL — ABNORMAL LOW (ref 150–400)
RBC: 2.87 MIL/uL — ABNORMAL LOW (ref 4.22–5.81)
RDW: 12.6 % (ref 11.5–15.5)
WBC: 13.5 10*3/uL — ABNORMAL HIGH (ref 4.0–10.5)
nRBC: 0 % (ref 0.0–0.2)

## 2019-06-29 LAB — BASIC METABOLIC PANEL
Anion gap: 7 (ref 5–15)
BUN: 37 mg/dL — ABNORMAL HIGH (ref 8–23)
CO2: 23 mmol/L (ref 22–32)
Calcium: 8.3 mg/dL — ABNORMAL LOW (ref 8.9–10.3)
Chloride: 107 mmol/L (ref 98–111)
Creatinine, Ser: 2.18 mg/dL — ABNORMAL HIGH (ref 0.61–1.24)
GFR calc Af Amer: 32 mL/min — ABNORMAL LOW (ref >60)
GFR calc non Af Amer: 27 mL/min — ABNORMAL LOW (ref >60)
Glucose, Bld: 215 mg/dL — ABNORMAL HIGH (ref 70–99)
Potassium: 4.9 mmol/L (ref 3.5–5.1)
Sodium: 137 mmol/L (ref 135–145)

## 2019-06-29 MED ORDER — SODIUM CHLORIDE 0.9% FLUSH
10.0000 mL | Freq: Three times a day (TID) | INTRAVENOUS | Status: DC
  Administered 2019-06-29 – 2019-07-05 (×16): 10 mL via INTRAVENOUS

## 2019-06-29 MED ORDER — CEFAZOLIN SODIUM-DEXTROSE 2-4 GM/100ML-% IV SOLN
INTRAVENOUS | Status: AC
  Administered 2019-06-29: 2 g via INTRAVENOUS
  Filled 2019-06-29: qty 100

## 2019-06-29 MED ORDER — ENOXAPARIN SODIUM 40 MG/0.4ML ~~LOC~~ SOLN: 40.0000 mg | SUBCUTANEOUS | 0 refills | Status: DC

## 2019-06-29 MED ORDER — TRAMADOL HCL 50 MG PO TABS: 50.0000 mg | ORAL_TABLET | Freq: Four times a day (QID) | ORAL | 0 refills | Status: DC | PRN

## 2019-06-29 MED ORDER — ACETAMINOPHEN 500 MG PO TABS
ORAL_TABLET | ORAL | Status: AC
  Filled 2019-06-29: qty 2

## 2019-06-29 MED ORDER — OXYCODONE HCL 5 MG PO TABS
ORAL_TABLET | ORAL | Status: AC
  Administered 2019-06-29: 11:00:00 5 mg via ORAL
  Filled 2019-06-29: qty 1

## 2019-06-29 MED ORDER — OXYCODONE HCL 5 MG PO TABS: 5.0000 mg | ORAL_TABLET | ORAL | 0 refills | Status: DC | PRN

## 2019-06-29 MED ORDER — INSULIN ASPART 100 UNIT/ML ~~LOC~~ SOLN
SUBCUTANEOUS | Status: AC
  Filled 2019-06-29: qty 1

## 2019-06-29 MED ORDER — INSULIN ASPART 100 UNIT/ML ~~LOC~~ SOLN
SUBCUTANEOUS | Status: AC
  Administered 2019-06-29: 2 [IU] via SUBCUTANEOUS
  Filled 2019-06-29: qty 1

## 2019-06-29 MED ORDER — ENOXAPARIN SODIUM 40 MG/0.4ML ~~LOC~~ SOLN
SUBCUTANEOUS | Status: AC
  Administered 2019-06-29: 40 mg via SUBCUTANEOUS
  Filled 2019-06-29: qty 0.4

## 2019-06-29 NOTE — OR Nursing (Signed)
Found patient sitting on edge of bed without any clothes on.  When asked where he was, he knew he was in the hospital and that he just had surgery.  Redressed gentleman and settled in bed again.  Call bell within reach.

## 2019-06-29 NOTE — TOC Progression Note (Addendum)
Transition of Care [facility]) - Progression Note    Patient Details  Name: ADOLPH CLUTTER MRN: [id number] Date of Birth: Nov 02, 1934  Transition of [facility]) CM/SW Contact  Anselm Pancoast, RN Phone Number: 06/29/2019, 2:04 PM  Clinical Narrative:    [place] via [place] being reviewed.Anticipate patient will need HHC for PT over the weekend.  [facility] unable to accept patient.  Outreach to [employer] and [facility] requesting availability. Both are unable to accept patient at this time. [facility] could accept but it would not be until next Friday.  [facility] unable to accept patient. Waiting response from [employer] for Sharyn Lull [phone] who is covering for Cassie.    [facility] unable to accept patient due to OON with insurance.   Spoke to [facility] who advised they could accept patient however patient would have OON copay which could be close to $100/visit.   [facility] unable to accept patient per Helene Kelp due to [facility] with patient. .   Will need to speak to patient/wife concerning option of OON home health with copay or outpatient PT.        Expected Discharge Plan: [place]    Expected Discharge Plan and Services Expected Discharge Plan: [place]       Living arrangements for the past 2 months: Single Family Home                                       Social Determinants of Health (SDOH) Interventions    Readmission Risk Interventions No flowsheet data found.

## 2019-06-29 NOTE — Progress Notes (Signed)
Patient disconnected IV and SCDs, wiggled his way to end of bed and got up between bed rail and end of bed without calling for help. Assisted to bathroom and back to bed.  All four rails of bed up and Bed alarm on.   Safety concerns addressed with patient and reiterated with patient with teach back. Call bell attached to patient gown.  PT notified.

## 2019-06-29 NOTE — Progress Notes (Signed)
Received report from Ernestine Mcmurray, Therapist, sports. Patient alert and oriented; in bed with no apparent distress. Pt denies pain at this time.VSS. Wife at the bedside. SCDs on. IV saline locked.  15 minute called to 1A at 4:25pm. Patient belongings gathered and placed in bag to take to room 150. Wife to accompany to room,

## 2019-06-29 NOTE — TOC Initial Note (Signed)
Transition of Care [facility]) - Initial/Assessment Note    Patient Details  Name: Cody Howard MRN: [id number] Date of Birth: 03-Aug-1934  Transition of Care [facility]) CM/SW Contact:    Anselm Pancoast, RN Phone Number: 06/29/2019, 10:06 AM  Clinical Narrative:                 Spoke with patient at bedside. Patient is motivated and eager to return home with his wife. Patient states he is independent with ADLs and drives at baseline. Patient is not anticipating inpatient rehabilitation and is hopeful for home with home health services. Physical therapy was at bedside beginning assessment upon completion of TOC assessment.         Expected Discharge Plan: [place]     Patient Goals and CMS Choice Patient states their goals for this hospitalization and ongoing recovery are:: Get back home      Expected Discharge Plan and Services Expected Discharge Plan: [place]       Living arrangements for the past 2 months: Single Family Home                                      Prior Living Arrangements/Services Living arrangements for the past 2 months: Single Family Home Lives with:: Spouse Patient language and need for interpreter reviewed:: Yes Do you feel safe going back to the place where you live?: Yes      Need for Family Participation in Patient Care: Yes (Comment) Care giver support system in place?: Yes (comment)   Criminal Activity/Legal Involvement Pertinent to Current Situation/Hospitalization: No - Comment as needed  Activities of Daily Living Home Assistive Devices/Equipment: None ADL Screening (condition at time of admission) Patient's cognitive ability adequate to safely complete daily activities?: Yes Is the patient deaf or have difficulty hearing?: No Does the patient have difficulty seeing, even when wearing glasses/contacts?: No Does the patient have difficulty concentrating, remembering, or making decisions?:  Yes Patient able to express need for assistance with ADLs?: Yes Does the patient have difficulty dressing or bathing?: No Independently performs ADLs?: Yes (appropriate for developmental age) Does the patient have difficulty walking or climbing stairs?: No Weakness of Legs: None Weakness of Arms/Hands: None  Permission Sought/Granted Permission sought to share information with : Case Manager Permission granted to share information with : Yes, Verbal Permission Granted  Share Information with NAME: TOC Department           Emotional Assessment Appearance:: Appears stated age Attitude/Demeanor/Rapport: Ambitious, Engaged Affect (typically observed): Accepting Orientation: : Oriented to Self, Oriented to Place, Oriented to  Time, Oriented to Situation Alcohol / Substance Use: Never Used Psych Involvement: No (comment)  Admission diagnosis:  Left hip pain [M25.552] Closed left hip fracture (HCC) [S72.002A] Closed fracture of left hip, initial encounter ([place]) [S72.002A] Fall, initial encounter [W19.XXXA] Patient Active Problem List   Diagnosis Date Noted  . Atrial fibrillation, chronic ([place]) 06/27/2019  . Depression 06/27/2019  . Fall at home, initial encounter 06/27/2019  . Closed displaced fracture of left femoral neck ([place]) 06/27/2019  . History of anemia 05/31/2019  . Benign prostatic hyperplasia with lower urinary tract symptoms 05/31/2019  . Cerebral atrophy, mild ([place]) 05/31/2019  . Reactive depression 05/31/2019  . Essential hypertension 05/31/2019  . Hiatal hernia 05/31/2019  . Dizziness 04/12/2018  . TIA (transient ischemic attack) 01/25/2018  . Chronic systolic CHF (  congestive heart failure), NYHA class 3 ([place]) 11/29/2017  . CKD (chronic kidney disease) stage 3, GFR 30-59 ml/min 08/18/2017  . Bradycardia 08/18/2017  . Atrial flutter, paroxysmal ([place]) 07/21/2017  . Functional dyspnea 07/06/2017  . Cardiomyopathy ([place]) 07/06/2017  . Hyperlipidemia, mixed 06/20/2017   . Hyperlipidemia associated with type 2 diabetes mellitus ([place]) 04/06/2016  . Type II diabetes mellitus with renal manifestations ([place]) 02/10/2015  . Personal history of other diseases of male genital organs 02/10/2015  . Sebaceous cyst 08/28/2014   PCP:  [name], MD Pharmacy:   [id number] [place], [place] [address] Phone: [phone] Fax: [place], [place] [facility] [address] [place] [address] [place] [address] Phone: [phone] Fax: [place], [place] [facility] [address] AT [place] [facility] [place] [address] Phone: [phone] Fax: [phone]     Social Determinants of Health (SDOH) Interventions    Readmission Risk Interventions No flowsheet data found.

## 2019-06-29 NOTE — Progress Notes (Signed)
PROGRESS NOTE    Cody DUNNAWAY  IOX:[id number] DOB: January 31, 1935 DOA: 06/27/2019 PCP: [name], MD    Brief Narrative:  HPI: Cody Howard is a 85 y.o. male with medical history significant of hypertension, hyperlipidemia, diabetes mellitus, TIA, depression, atrial fibrillation not on anticoagulants, CKD stage IIIa, sCHF with EF of 45%, who presents with fall and left hip pain.  Pt states that fell on the side way of his home when he was walking outside to get the paper. He injured his left hip, causing severe pain in left hip. No loss of consciousness.  Strongly denies any head or neck injury.  Refused CT of head and neck.  Patient does not have chest pain, shortness breath, cough.  No nausea vomiting, diarrhea, abdominal pain, symptoms of UTI or unilateral weakness. Of note, pt had laceration to right palm on 4/10,which was sutured up and healing well.   4/22: Seen and examined in postanesthesia care unit. Hemodynamically stable. Little sleepy but easily awakened. Denies pain. Status post hemiarthroplasty with orthopedic surgery for a left hip displaced femoral neck fracture  4/23: Seen and examined.  Remains hemodynamically stable.  Postoperative day #1.  Sitting up in bed.  Answers all questions appropriately.  Per nursing staff patient has some difficulty being directable.  Apparently was attempting to remove clothing and exit the bed without requesting nursing assistance.  Patient educated.   Assessment & Plan:   Principal Problem:   Closed displaced fracture of left femoral neck (HCC) Active Problems:   Type II diabetes mellitus with renal manifestations (HCC)   CKD (chronic kidney disease) stage 3, GFR 30-59 ml/min   Hyperlipidemia, mixed   Chronic systolic CHF (congestive heart failure), NYHA class 3 (HCC)   TIA (transient ischemic attack)   Essential hypertension   Atrial fibrillation, chronic ([place])   Depression   Fall at home, initial encounter  Closed displaced  fracture of left femoral neck ([place])  As evidenced by x-ray.  POD#1 s/p hemiarthroplasty with Dr. Hyman Bower procedure well Ortho following Plan: Multimodal pain control As needed nausea control PT OT today: rec [place] Start chemoprophylaxis on postop day #1   Type II diabetes mellitus with renal manifestations ([place])  Most recent A1c 6.6, well controled.  Patient is taking Metformin at home Home metformin on hold -SSI  CKD (chronic kidney disease) stage 3a, GFR 30-59 ml/min  Renal function is close to baseline.   Recent baseline creatinine 2.0-2.1.   -f/y by BMP  Hyperlipidemia, mixed: -statin  Chronic systolic CHF (congestive heart failure), NYHA class 3 ([place])  2D echo on 03/05/2019 showed EF of 45%.   Patient does not have leg edema JVD.  No shortness of breath.  CHF is compensated.   BNP 27 -Continue home dose Lasix and Coreg  TIA (transient ischemic attack): -statin  HTN:  -Continue home medications: Cozaar, Coreg -Patient is also on Lasix -hydralazine prn  Atrial fibrillation, chronic ([place]): Not on anticoagulants.  Heart rate 83 -Cardiac monitor -Continue Coreg  Depression -Continue Zoloft  Fall at home, initial encounter -PT/OT when able to   DVT prophylaxis: SCDs Code Status: Full Family Communication:  Attempted to call wife, unable to reach Disposition Plan:Status is: Inpatient  Remains inpatient appropriate because:Inpatient level of care appropriate due to severity of illness   Dispo: The patient is from: Home              Anticipated d/c is to: SNF  Anticipated d/c date is: 2 days              Patient currently is not medically stable to d/c. Currently postop day #1 status post left hip hemiarthroplasty.  Physical therapy and Occupational Therapy on consult.  Physical therapy recommending home with home health.  Chemoprophylaxis to start tomorrow.  Defer to orthopedics in regards to length of need for chemoprophylaxis.   Needs to have BM.  Possible discharge on 4/24      Consultants:   Orthopedics  Procedures:   Left hip hemiarthroplasty-06/28/2019  Antimicrobials:   None   Subjective: Seen and examined No complaints  Objective: Vitals:   06/28/19 2200 06/29/19 0145 06/29/19 0600 06/29/19 0900  BP: 123/63 (!) 146/57 (!) 132/50 (!) 119/53  Pulse: 77 77  65  Resp: 20 20 18 18   Temp: 98.3 F (36.8 C) (!) 97.5 F (36.4 C) 97.9 F (36.6 C)   TempSrc:      SpO2: 93% 94% 94% 98%  Weight:      Height:        Intake/Output Summary (Last 24 hours) at 06/29/2019 1359 Last data filed at 06/29/2019 8466 Gross per 24 hour  Intake 1440 ml  Output 1275 ml  Net 165 ml   Filed Weights   06/27/19 0950  Weight: 88.5 kg    Examination:  General exam: Appears calm and comfortable  Respiratory system: Clear to auscultation. Respiratory effort normal. Cardiovascular system: S1 & S2 heard, RRR. No JVD, murmurs, rubs, gallops or clicks. No pedal edema. Gastrointestinal system: Abdomen is nondistended, soft and nontender. No organomegaly or masses felt. Normal bowel sounds heard. Central nervous system: Alert and oriented. No focal neurological deficits. Extremities: Lower extremities in immobilization and surgical dressing, not removed Skin: No rashes, lesions or ulcers Psychiatry: Judgement and insight appear normal. Mood & affect appropriate.     Data Reviewed: I have personally reviewed following labs and imaging studies  CBC: Recent Labs  Lab 06/27/19 1118 06/28/19 0559 06/29/19 0617  WBC 9.4 10.6* 13.5*  NEUTROABS 7.2  --   --   HGB 12.2* 11.3* 9.7*  HCT 36.2* 33.6* 28.5*  MCV 99.7 97.4 99.3  PLT 138* 132* 599*   Basic Metabolic Panel: Recent Labs  Lab 06/27/19 1118 06/28/19 0559 06/29/19 0617  NA 139 137 137  K 4.9 5.2* 4.9  CL 109 105 107  CO2 23 24 23   GLUCOSE 188* 219* 215*  BUN 30* 32* 37*  CREATININE 2.13* 2.04* 2.18*  CALCIUM 8.9 8.7* 8.3*   GFR: Estimated  Creatinine Clearance: 27.8 mL/min (A) (by C-G formula based on SCr of 2.18 mg/dL (H)). Liver Function Tests: No results for input(s): AST, ALT, ALKPHOS, BILITOT, PROT, ALBUMIN in the last 168 hours. No results for input(s): LIPASE, AMYLASE in the last 168 hours. No results for input(s): AMMONIA in the last 168 hours. Coagulation Profile: Recent Labs  Lab 06/27/19 1118  INR 0.9   Cardiac Enzymes: No results for input(s): CKTOTAL, CKMB, CKMBINDEX, TROPONINI in the last 168 hours. BNP (last 3 results) No results for input(s): PROBNP in the last 8760 hours. HbA1C: No results for input(s): HGBA1C in the last 72 hours. CBG: Recent Labs  Lab 06/28/19 1411 06/28/19 1721 06/28/19 2201 06/29/19 0745 06/29/19 1207  GLUCAP 194* 214* 273* 196* 190*   Lipid Profile: No results for input(s): CHOL, HDL, LDLCALC, TRIG, CHOLHDL, LDLDIRECT in the last 72 hours. Thyroid Function Tests: No results for input(s): TSH, T4TOTAL, FREET4, T3FREE, THYROIDAB in  the last 72 hours. Anemia Panel: No results for input(s): VITAMINB12, FOLATE, FERRITIN, TIBC, IRON, RETICCTPCT in the last 72 hours. Sepsis Labs: No results for input(s): PROCALCITON, LATICACIDVEN in the last 168 hours.  Recent Results (from the past 240 hour(s))  Respiratory Panel by RT PCR (Flu A&B, Covid) - Nasopharyngeal Swab     Status: None   Collection Time: 06/27/19 11:54 AM   Specimen: Nasopharyngeal Swab  Result Value Ref Range Status   SARS Coronavirus 2 by RT PCR NEGATIVE NEGATIVE Final    Comment: (NOTE) SARS-CoV-2 target nucleic acids are NOT DETECTED. The SARS-CoV-2 RNA is generally detectable in upper respiratoy specimens during the acute phase of infection. The lowest concentration of SARS-CoV-2 viral copies this assay can detect is 131 copies/mL. A negative result does not preclude SARS-Cov-2 infection and should not be used as the sole basis for treatment or other patient management decisions. A negative result may occur  with  improper specimen collection/handling, submission of specimen other than nasopharyngeal swab, presence of viral mutation(s) within the areas targeted by this assay, and inadequate number of viral copies (<131 copies/mL). A negative result must be combined with clinical observations, patient history, and epidemiological information. The expected result is Negative. Fact Sheet for Patients:  PinkCheek.be Fact Sheet for Healthcare Providers:  GravelBags.it This test is not yet ap proved or cleared by the [place] FDA and  has been authorized for detection and/or diagnosis of SARS-CoV-2 by FDA under an Emergency Use Authorization (EUA). This EUA will remain  in effect (meaning this test can be used) for the duration of the COVID-19 declaration under Section 564(b)(1) of the Act, 21 U.S.C. section 360bbb-3(b)(1), unless the authorization is terminated or revoked sooner.    Influenza A by PCR NEGATIVE NEGATIVE Final   Influenza B by PCR NEGATIVE NEGATIVE Final    Comment: (NOTE) The Xpert Xpress SARS-CoV-2/FLU/RSV assay is intended as an aid in  the diagnosis of influenza from Nasopharyngeal swab specimens and  should not be used as a sole basis for treatment. Nasal washings and  aspirates are unacceptable for Xpert Xpress SARS-CoV-2/FLU/RSV  testing. Fact Sheet for Patients: PinkCheek.be Fact Sheet for Healthcare Providers: GravelBags.it This test is not yet approved or cleared by the [place] FDA and  has been authorized for detection and/or diagnosis of SARS-CoV-2 by  FDA under an Emergency Use Authorization (EUA). This EUA will remain  in effect (meaning this test can be used) for the duration of the  Covid-19 declaration under Section 564(b)(1) of the Act, 21  U.S.C. section 360bbb-3(b)(1), unless the authorization is  terminated or revoked. Performed  at [facility], [address] [place], [place] [address]   Surgical pcr screen     Status: None   Collection Time: 06/27/19  5:41 PM   Specimen: Nasal Mucosa; Nasal Swab  Result Value Ref Range Status   MRSA, PCR NEGATIVE NEGATIVE Final   Staphylococcus aureus NEGATIVE NEGATIVE Final    Comment: (NOTE) The Xpert SA Assay (FDA approved for NASAL specimens in patients 30 years of age and older), is one component of a comprehensive surveillance program. It is not intended to diagnose infection nor to guide or monitor treatment. Performed at [facility], [address] [place], [place] [address]          Radiology Studies: DG HIP Mal[facility] Johns OR W/O PELVIS 2-3 VIEWS LEFT  Result Date: 06/28/2019 CLINICAL DATA:  Postoperative left hip arthroplasty EXAM: DG HIP (WITH OR WITHOUT  PELVIS) 2-3V LEFT COMPARISON:  Radiograph 06/27/2019 FINDINGS: There are postsurgical changes from left hip hemiarthroplasty with resection of the left femoral head and neck and placement of an articulating femoral stem. Alignment is within expected normals. Expected postsurgical soft tissue changes are noted with overlying swelling and intra-articular gas. No acute complication is evident. IMPRESSION: Satisfactory postoperative appearance status post left hip hemiarthroplasty. Electronically Signed   By: Lovena Le M.D.   On: 06/28/2019 17:06        Scheduled Meds: . acetaminophen      . acetaminophen  1,000 mg Oral Q6H  . atorvastatin  40 mg Oral Daily  . carvedilol  3.125 mg Oral BID WC  . cephALEXin  500 mg Oral BID  . docusate sodium  100 mg Oral BID  . donepezil  10 mg Oral Daily  . enoxaparin (LOVENOX) injection  40 mg Subcutaneous Q24H  . furosemide  20 mg Oral Daily  . insulin aspart  0-9 Units Subcutaneous TID AC & HS  . losartan  50 mg Oral Daily  . melatonin  5 mg Oral QHS  . omega-3 acid ethyl esters  1 g Oral Daily  . Ensure Max Protein  11 oz Oral BID BM  .  sertraline  25 mg Oral Daily  . tamsulosin  0.4 mg Oral Daily  . trimethoprim-polymyxin b  1 drop Both Eyes Q4H   Continuous Infusions: . sodium chloride    . sodium chloride 75 mL/hr (06/28/19 1536)  .  ceFAZolin (ANCEF) IV       LOS: 2 days    Time spent: 35 min    Sidney Ace, MD [facility] Pager [phone]  If 7PM-7AM, please contact night-coverage 06/29/2019, 1:59 PM

## 2019-06-29 NOTE — Evaluation (Signed)
Physical Therapy Evaluation Patient Details Name: Cody Howard MRN: [id number] DOB: 1934/08/10 Today's Date: 06/29/2019   History of Present Illness  Pt admitted for L femoral neck fx secondary to falls s/p unipolar hemiarthoplasty POD 1 at time of evaluation. HIstory includes HTN, DM, TIA, depression, Afib, and CKD 3.  Clinical Impression  Pt is a pleasant 85 year old male who was admitted for L hip hemiarthoplasty. Pt performs bed mobility with cga, transfers with min assist, and ambulation with min assist and RW. Pt demonstrates deficits with strength/mobility/pain. Pt is alert and oriented x 4, however completing session, noticed bed alarm dinging as pt trying to get OOB to "use bathroom", re-educated on safety. Pt educated on hip precautions, able to recall 2/3. Appears very motivated to perform therapy. Would benefit from skilled PT to address above deficits and promote optimal return to PLOF. Recommend transition to [place] upon discharge from acute hospitalization.     Follow Up Recommendations Home health PT;Supervision/Assistance - 24 hour    Equipment Recommendations  (reports he wants to use his mom's RW)    Recommendations for Other Services       Precautions / Restrictions Precautions Precautions: Posterior Hip;Fall Precaution Booklet Issued: No Restrictions Weight Bearing Restrictions: Yes LLE Weight Bearing: Weight bearing as tolerated      Mobility  Bed Mobility Overal bed mobility: Needs Assistance Bed Mobility: Supine to Sit     Supine to sit: Min guard     General bed mobility comments: needs assist for sequencing to maintain hip precautions. Once seated at EOB, able to sit with upright posture  Transfers Overall transfer level: Needs assistance Equipment used: Rolling walker (2 wheeled) Transfers: Sit to/from Stand Sit to Stand: Min assist         General transfer comment: needs cues for sequencing. Once standing, slight unsteadiness noted,  however no formal LOB noted  Ambulation/Gait Ambulation/Gait assistance: Min guard Gait Distance (Feet): 60 Feet Assistive device: Rolling walker (2 wheeled) Gait Pattern/deviations: Step-through pattern     General Gait Details: ambulated in hallway with reciprocal gait pattern. Slight B knee buckling, however no instability. Needs constant hands on at this time. Slightly impulsive during turns. Needs cues for hip precautions.  Stairs            Wheelchair Mobility    Modified Rankin (Stroke Patients Only)       Balance Overall balance assessment: Needs assistance;History of Falls Sitting-balance support: Feet supported Sitting balance-Leahy Scale: Good     Standing balance support: Bilateral upper extremity supported Standing balance-Leahy Scale: Fair                               Pertinent Vitals/Pain Pain Assessment: 0-10 Pain Score: 6  Pain Location: L hip Pain Descriptors / Indicators: Operative site guarding Pain Intervention(s): Limited activity within patient's tolerance;Patient requesting pain meds-RN notified    Home Living Family/patient expects to be discharged to:: Private residence Living Arrangements: Spouse/significant other Available Help at Discharge: Family;Available 24 hours/day Type of Home: House Home Access: Stairs to enter Entrance Stairs-Rails: None Entrance Stairs-Number of Steps: 3 Home Layout: One level Home Equipment: Cane - single point Additional Comments: reports he has access to [employer] RW    Prior Function Level of Independence: Independent         Comments: reports no previous falls     Hand Dominance        Extremity/Trunk Assessment  Upper Extremity Assessment Upper Extremity Assessment: Overall WFL for tasks assessed    Lower Extremity Assessment Lower Extremity Assessment: Generalized weakness(L LE grossly 3+/5)       Communication   Communication: No difficulties  Cognition  Arousal/Alertness: Awake/alert Behavior During Therapy: WFL for tasks assessed/performed Overall Cognitive Status: Within Functional Limits for tasks assessed                                        General Comments      Exercises Other Exercises Other Exercises: supine ther-ex performed on L LE including AP, quad sets, SLRs, glut sets, and hip abd/add. All ther-ex performed x 10 reps with cga.    Assessment/Plan    PT Assessment Patient needs continued PT services  PT Problem List Decreased strength;Decreased activity tolerance;Decreased balance;Decreased mobility;Pain       PT Treatment Interventions DME instruction;Gait training;Stair training;Therapeutic exercise;Balance training    PT Goals (Current goals can be found in the Care Plan section)  Acute Rehab PT Goals Patient Stated Goal: to go home PT Goal Formulation: With patient Time For Goal Achievement: 07/13/19 Potential to Achieve Goals: Good    Frequency BID   Barriers to discharge        Co-evaluation               AM-PAC PT "6 Clicks" Mobility  Outcome Measure Help needed turning from your back to your side while in a flat bed without using bedrails?: A Little Help needed moving from lying on your back to sitting on the side of a flat bed without using bedrails?: A Little Help needed moving to and from a bed to a chair (including a wheelchair)?: A Little Help needed standing up from a chair using your arms (e.g., wheelchair or bedside chair)?: A Little Help needed to walk in hospital room?: A Little Help needed climbing 3-5 steps with a railing? : A Lot 6 Click Score: 17    End of Session Equipment Utilized During Treatment: Gait belt Activity Tolerance: Patient tolerated treatment well Patient left: in bed;with bed alarm set;with SCD's reapplied Nurse Communication: Mobility status PT Visit Diagnosis: Pain;Unsteadiness on feet (R26.81);Muscle weakness (generalized)  (M62.81);Difficulty in walking, not elsewhere classified (R26.2) Pain - Right/Left: Left Pain - part of body: Hip    Time: 7858-8502 PT Time Calculation (min) (ACUTE ONLY): 29 min   Charges:   PT Evaluation $PT Eval Moderate Complexity: 1 Mod PT Treatments $Therapeutic Exercise: 8-22 mins        Greggory Stallion, PT, DPT [phone]   Dessirae Scarola 06/29/2019, 12:28 PM

## 2019-06-29 NOTE — Progress Notes (Signed)
Physical Therapy Treatment Patient Details Name: Cody Howard MRN: [id number] DOB: 1935/02/23 Today's Date: 06/29/2019    History of Present Illness Pt admitted for L femoral neck fx secondary to falls s/p unipolar hemiarthoplasty POD 1 at time of evaluation. HIstory includes HTN, DM, TIA, depression, Afib, and CKD 3.    PT Comments    Pt is making progress towards goals with increased fluid movement pattern and decreased impulsive nature. Able to recall 2/3 hip precautions and required less cues for safety. Still fatigues with hallway ambulation. Spoke with pt wife is requesting RW and BSC. This Probation officer updated note for recommendation. Good endurance with there-ex. Will progress as tolerated.   Follow Up Recommendations  Home health PT;Supervision/Assistance - 24 hour     Equipment Recommendations  Rolling walker with 5" wheels;3in1 (PT)(now requesting equipment)    Recommendations for Other Services       Precautions / Restrictions Precautions Precautions: Posterior Hip;Fall Precaution Booklet Issued: No Restrictions Weight Bearing Restrictions: Yes LLE Weight Bearing: Weight bearing as tolerated    Mobility  Bed Mobility Overal bed mobility: Needs Assistance Bed Mobility: Supine to Sit     Supine to sit: Min guard     General bed mobility comments: needs assist for sequencing to maintain hip precautions. Once seated at EOB, able to sit with upright posture. More fluid than previous session  Transfers Overall transfer level: Needs assistance Equipment used: Rolling walker (2 wheeled) Transfers: Sit to/from Stand Sit to Stand: Min assist         General transfer comment: needs cues for safe technique including pushing from seated surface  Ambulation/Gait Ambulation/Gait assistance: Min guard Gait Distance (Feet): 60 Feet Assistive device: Rolling walker (2 wheeled) Gait Pattern/deviations: Step-through pattern     General Gait Details: ambulated in  hallway, fatigues with increased distance. B knee flexed, however no overt buckling like previous session. Improved quality with gait this session with improved safety awareness. Needs cues for hip precautions with turns   Stairs             Wheelchair Mobility    Modified Rankin (Stroke Patients Only)       Balance Overall balance assessment: Needs assistance;History of Falls Sitting-balance support: Feet supported Sitting balance-Leahy Scale: Good     Standing balance support: Bilateral upper extremity supported Standing balance-Leahy Scale: Fair                              Cognition Arousal/Alertness: Awake/alert Behavior During Therapy: WFL for tasks assessed/performed Overall Cognitive Status: Within Functional Limits for tasks assessed                                        Exercises Other Exercises Other Exercises: supine ther-ex performed on L LE including AP, quad sets, SAQs, glut sets, and hip abd/add. All ther-ex performed x 10 reps with cga.     General Comments        Pertinent Vitals/Pain Pain Assessment: 0-10 Pain Score: 1  Pain Location: L hip Pain Descriptors / Indicators: Operative site guarding Pain Intervention(s): Limited activity within patient's tolerance    Home Living Family/patient expects to be discharged to:: Private residence Living Arrangements: Spouse/significant other Available Help at Discharge: Family;Available 24 hours/day Type of Home: House Home Access: Stairs to enter Entrance Stairs-Rails: None Home Layout: One level Home  Equipment: Kasandra Knudsen - single point Additional Comments: reports he has access to mother's RW    Prior Function Level of Independence: Independent      Comments: reports no previous falls   PT Goals (current goals can now be found in the care plan section) Acute Rehab PT Goals Patient Stated Goal: to go home PT Goal Formulation: With patient Time For Goal Achievement:  07/13/19 Potential to Achieve Goals: Good Additional Goals Additional Goal #1: Pt will be able to perform bed mobility/strength with mod I and safe technique to improve functional independence. Progress towards PT goals: Progressing toward goals    Frequency    BID      PT Plan Current plan remains appropriate    Co-evaluation              AM-PAC PT "6 Clicks" Mobility   Outcome Measure  Help needed turning from your back to your side while in a flat bed without using bedrails?: A Little Help needed moving from lying on your back to sitting on the side of a flat bed without using bedrails?: A Little Help needed moving to and from a bed to a chair (including a wheelchair)?: A Little Help needed standing up from a chair using your arms (e.g., wheelchair or bedside chair)?: A Little Help needed to walk in hospital room?: A Little Help needed climbing 3-5 steps with a railing? : A Lot 6 Click Score: 17    End of Session Equipment Utilized During Treatment: Gait belt Activity Tolerance: Patient tolerated treatment well Patient left: in bed;with bed alarm set;with SCD's reapplied Nurse Communication: Mobility status PT Visit Diagnosis: Pain;Unsteadiness on feet (R26.81);Muscle weakness (generalized) (M62.81);Difficulty in walking, not elsewhere classified (R26.2) Pain - Right/Left: Left Pain - part of body: Hip     Time: 9480-1655 PT Time Calculation (min) (ACUTE ONLY): 26 min  Charges:  $Gait Training: 8-22 mins $Therapeutic Exercise: 8-22 mins                     Greggory Stallion, PT, DPT [phone]    Leyli Kevorkian 06/29/2019, 3:32 PM

## 2019-06-29 NOTE — Progress Notes (Signed)
  Subjective: 1 Day Post-Op Procedure(s) (LRB): ARTHROPLASTY BIPOLAR HIP (HEMIARTHROPLASTY) (Left) Patient reports pain as mild.   Patient is well, and has had no acute complaints or problems PT and Care management to assist with discharge planning. Negative for chest pain and shortness of breath Fever: no Gastrointestinal:Negative for nausea and vomiting  Objective: Vital signs in last 24 hours: Temp:  [97.5 F (36.4 C)-98.5 F (36.9 C)] 97.9 F (36.6 C) (04/23 0600) Pulse Rate:  [59-77] 77 (04/23 0145) Resp:  [7-20] 18 (04/23 0600) BP: (115-155)/(50-94) 132/50 (04/23 0600) SpO2:  [93 %-100 %] 94 % (04/23 0600)  Intake/Output from previous day:  Intake/Output Summary (Last 24 hours) at 06/29/2019 0742 Last data filed at 06/29/2019 6144 Gross per 24 hour  Intake 2440 ml  Output 1375 ml  Net 1065 ml    Intake/Output this shift: No intake/output data recorded.  Labs: Recent Labs    06/27/19 1118 06/28/19 0559 06/29/19 0617  HGB 12.2* 11.3* 9.7*   Recent Labs    06/28/19 0559 06/29/19 0617  WBC 10.6* 13.5*  RBC 3.45* 2.87*  HCT 33.6* 28.5*  PLT 132* 122*   Recent Labs    06/28/19 0559 06/29/19 0617  NA 137 137  K 5.2* 4.9  CL 105 107  CO2 24 23  BUN 32* 37*  CREATININE 2.04* 2.18*  GLUCOSE 219* 215*  CALCIUM 8.7* 8.3*   Recent Labs    06/27/19 1118  INR 0.9     EXAM General - Patient is Alert, Appropriate and Oriented Extremity - ABD soft Sensation intact distally Intact pulses distally Dorsiflexion/Plantar flexion intact Incision: dressing C/D/I No cellulitis present Dressing/Incision - clean, dry, no drainage Motor Function - intact, moving foot and toes well on exam.  Abdomen soft with normal BS.  Past Medical History:  Diagnosis Date  . AF (atrial fibrillation) ([place])   . Arthritis    hands  . Chronic kidney disease   . Diabetes mellitus without complication ([place])   . Hyperlipidemia   . Hypertension   . Prostate enlargement      Assessment/Plan: 1 Day Post-Op Procedure(s) (LRB): ARTHROPLASTY BIPOLAR HIP (HEMIARTHROPLASTY) (Left) Principal Problem:   Closed displaced fracture of left femoral neck (HCC) Active Problems:   Type II diabetes mellitus with renal manifestations (HCC)   CKD (chronic kidney disease) stage 3, GFR 30-59 ml/min   Hyperlipidemia, mixed   Chronic systolic CHF (congestive heart failure), NYHA class 3 (HCC)   TIA (transient ischemic attack)   Essential hypertension   Atrial fibrillation, chronic ([place])   Depression   Fall at home, initial encounter  Estimated body mass index is 27.2 kg/m as calculated from the following:   Height as of this encounter: 5\' 11"  (1.803 m).   Weight as of this encounter: 88.5 kg. Advance diet Up with therapy   Labs reviewed this AM.  Hg 9.7 this AM, Platelets 122. Cr 2.18, patient with CKD Stage III, around baseline.  CBC and BMP ordered for tomorrow morning Begin working on BM. Up with therapy today.  DVT Prophylaxis - Lovenox, Foot Pumps and TED hose Weight-Bearing as tolerated to left leg  J. Cameron Proud, PA-C [facility] 06/29/2019, 7:42 AM

## 2019-06-30 LAB — GLUCOSE, CAPILLARY
Glucose-Capillary: 164 mg/dL — ABNORMAL HIGH (ref 70–99)
Glucose-Capillary: 205 mg/dL — ABNORMAL HIGH (ref 70–99)
Glucose-Capillary: 153 mg/dL — ABNORMAL HIGH (ref 70–99)
Glucose-Capillary: 169 mg/dL — ABNORMAL HIGH (ref 70–99)

## 2019-06-30 LAB — CBC
HCT: 26.7 % — ABNORMAL LOW (ref 39.0–52.0)
Hemoglobin: 9.4 g/dL — ABNORMAL LOW (ref 13.0–17.0)
MCH: 34.3 pg — ABNORMAL HIGH (ref 26.0–34.0)
MCHC: 35.2 g/dL (ref 30.0–36.0)
MCV: 97.4 fL (ref 80.0–100.0)
Platelets: 122 10*3/uL — ABNORMAL LOW (ref 150–400)
RBC: 2.74 MIL/uL — ABNORMAL LOW (ref 4.22–5.81)
RDW: 13 % (ref 11.5–15.5)
WBC: 11.8 10*3/uL — ABNORMAL HIGH (ref 4.0–10.5)
nRBC: 0 % (ref 0.0–0.2)

## 2019-06-30 LAB — BASIC METABOLIC PANEL
Anion gap: 9 (ref 5–15)
BUN: 46 mg/dL — ABNORMAL HIGH (ref 8–23)
CO2: 22 mmol/L (ref 22–32)
Calcium: 8.2 mg/dL — ABNORMAL LOW (ref 8.9–10.3)
Chloride: 103 mmol/L (ref 98–111)
Creatinine, Ser: 2.46 mg/dL — ABNORMAL HIGH (ref 0.61–1.24)
GFR calc Af Amer: 27 mL/min — ABNORMAL LOW (ref >60)
GFR calc non Af Amer: 24 mL/min — ABNORMAL LOW (ref >60)
Glucose, Bld: 169 mg/dL — ABNORMAL HIGH (ref 70–99)
Potassium: 4.5 mmol/L (ref 3.5–5.1)
Sodium: 134 mmol/L — ABNORMAL LOW (ref 135–145)

## 2019-06-30 MED ORDER — ENOXAPARIN SODIUM 30 MG/0.3ML ~~LOC~~ SOLN
30.0000 mg | SUBCUTANEOUS | Status: DC
  Administered 2019-07-01 – 2019-07-03 (×3): 30 mg via SUBCUTANEOUS
  Filled 2019-06-30 (×3): qty 0.3

## 2019-06-30 MED ORDER — SODIUM CHLORIDE 0.9 % IV SOLN: INTRAVENOUS | Status: AC

## 2019-06-30 NOTE — Discharge Instructions (Signed)
Instructions after Total Hip Replacement     J. Jeffrey Poggi, M.D.  J. Lance Lamarco Gudiel, PA-C     Dept. of Orthopaedics & Sports Medicine  [facility]  [address]  [place], [place]  [address]  Phone: [phone]   Fax: [phone]    DIET: . Drink plenty of non-alcoholic fluids. . Resume your normal diet. Include foods high in fiber.  ACTIVITY:  . You may use crutches or a walker with weight-bearing as tolerated, unless instructed otherwise. . You may be weaned off of the walker or crutches by your Physical Therapist.  . Do NOT reach below the level of your knees or cross your legs until allowed.    . Continue doing gentle exercises. Exercising will reduce the pain and swelling, increase motion, and prevent muscle weakness.   . Please continue to use the TED compression stockings for 6 weeks. You may remove the stockings at night, but should reapply them in the morning. . Do not drive or operate any equipment until instructed.  WOUND CARE:  . Continue to use ice packs periodically to reduce pain and swelling. . Keep the incision clean and dry. . You may bathe or shower after the staples are removed at the first office visit following surgery.  MEDICATIONS: . You may resume your regular medications. . Please take the pain medication as prescribed on the medication. . Do not take pain medication on an empty stomach. . You have been given a prescription for a blood thinner to prevent blood clots. Please take the medication as instructed. (NOTE: After completing a 2 week course of Lovenox, take one Enteric-coated aspirin once a day.) . Pain medications and iron supplements can cause constipation. Use a stool softener (Senokot or Colace) on a daily basis and a laxative (dulcolax or miralax) as needed. . Do not drive or drink alcoholic beverages when taking pain medications.  CALL THE OFFICE FOR: . Temperature above 101 degrees . Excessive bleeding or drainage on the  dressing. . Excessive swelling, coldness, or paleness of the toes. . Persistent nausea and vomiting.  FOLLOW-UP:  . You should have an appointment to return to the office in 2 weeks after surgery. . Arrangements have been made for continuation of Physical Therapy (either home therapy or outpatient therapy).  

## 2019-06-30 NOTE — Progress Notes (Signed)
PT Cancellation Note  Patient Details Name: Cody Howard MRN: [id number] DOB: 12-23-1934   Cancelled Treatment:    Reason Eval/Treat Not Completed: Fatigue/lethargy limiting ability to participate. Patient reports that he is not feeling up to any more PT today.   [address] [place], [place] DPT 06/30/2019, 1:23 PM

## 2019-06-30 NOTE — Progress Notes (Signed)
Physical Therapy Treatment Patient Details Name: Cody Howard MRN: [id number] DOB: 11/15/1934 Today's Date: 06/30/2019    History of Present Illness Pt admitted for L femoral neck fx secondary to falls s/p unipolar hemiarthoplasty POD 1 at time of evaluation. HIstory includes HTN, DM, TIA, depression, Afib, and CKD 3.    PT Comments    Patient is reporting fatigue and wanting to go back to his bed. He performs sit to stand transfer with RW and min guard. He ambulates 3 steps from recliner to bed and performs stand to sit transfer. He needs mod assist with sit to supine bed mobility for BLE assist. He reports no pain. He refuses the hip abd wedge and is positioned with 2 pillows between his knees. He is able to recall 1 out of 3 hip precautions and they are reviewed with patient. He will continue to benefit from skilled PT to improve mobility and strength.    Follow Up Recommendations  Home health PT     Equipment Recommendations  Rolling walker with 5" wheels;3in1 (PT)    Recommendations for Other Services       Precautions / Restrictions Precautions Precautions: Posterior Hip;Fall Restrictions Weight Bearing Restrictions: Yes LLE Weight Bearing: Weight bearing as tolerated    Mobility  Bed Mobility Overal bed mobility: Needs Assistance Bed Mobility: Sit to Supine     Supine to sit: Mod assist     General bed mobility comments: needs assist for LE's to get back to bed  Transfers Overall transfer level: Needs assistance Equipment used: Rolling walker (2 wheeled) Transfers: Sit to/from Stand Sit to Stand: Min guard Stand pivot transfers: Min guard       General transfer comment: VC for safety  Ambulation/Gait Ambulation/Gait assistance: (Pt reports that he is too weak to ambulate) Gait Distance (Feet): 3 Feet Assistive device: Rolling walker (2 wheeled) Gait Pattern/deviations: Step-to pattern         Stairs             Wheelchair Mobility     Modified Rankin (Stroke Patients Only)       Balance Overall balance assessment: Needs assistance Sitting-balance support: Feet supported Sitting balance-Leahy Scale: Good     Standing balance support: Bilateral upper extremity supported Standing balance-Leahy Scale: Fair                              Cognition Arousal/Alertness: Awake/alert Behavior During Therapy: WFL for tasks assessed/performed Overall Cognitive Status: Within Functional Limits for tasks assessed                                        Exercises Other Exercises Other Exercises: Pt and caregiver educated re: d/c recommendations, DME recommendations, AE for LBD, functional application of posterior hip pcns, home/routine modifications, energy conservation, falls prevention Other Exercises: Self-feeding, LBD, UBD, sup>sit, sit<>stand, SPT, RW technique    General Comments        Pertinent Vitals/Pain Pain Assessment: No/denies pain Faces Pain Scale: Hurts little more Pain Location: L hip Pain Descriptors / Indicators: Operative site guarding Pain Intervention(s): Limited activity within patient's tolerance;Repositioned    Home Living Family/patient expects to be discharged to:: Private residence Living Arrangements: Spouse/significant other Available Help at Discharge: Family;Available 24 hours/day Type of Home: House Home Access: Stairs to enter Entrance Stairs-Rails: None Home Layout: One level Home  Equipment: Kasandra Knudsen - single point;Grab bars - tub/shower;Hand held shower head      Prior Function Level of Independence: Independent          PT Goals (current goals can now be found in the care plan section) Acute Rehab PT Goals Patient Stated Goal: to go home PT Goal Formulation: With patient Time For Goal Achievement: 07/13/19 Potential to Achieve Goals: Good Progress towards PT goals: Progressing toward goals    Frequency           PT Plan Current plan  remains appropriate    Co-evaluation              AM-PAC PT "6 Clicks" Mobility   Outcome Measure  Help needed turning from your back to your side while in a flat bed without using bedrails?: A Little Help needed moving from lying on your back to sitting on the side of a flat bed without using bedrails?: A Little Help needed moving to and from a bed to a chair (including a wheelchair)?: A Little Help needed standing up from a chair using your arms (e.g., wheelchair or bedside chair)?: A Little Help needed to walk in hospital room?: A Little Help needed climbing 3-5 steps with a railing? : A Little 6 Click Score: 18    End of Session Equipment Utilized During Treatment: Gait belt Activity Tolerance: Patient limited by fatigue Patient left: in bed;with bed alarm set Nurse Communication: Mobility status PT Visit Diagnosis: Unsteadiness on feet (R26.81);Muscle weakness (generalized) (M62.81);Difficulty in walking, not elsewhere classified (R26.2) Pain - Right/Left: Left     Time: 5749-3552 PT Time Calculation (min) (ACUTE ONLY): 20 min  Charges:  $Therapeutic Activity: 8-22 mins                        Alanson Puls, PT DPT 06/30/2019, 10:26 AM

## 2019-06-30 NOTE — Progress Notes (Signed)
  Subjective: 2 Days Post-Op Procedure(s) (LRB): ARTHROPLASTY BIPOLAR HIP (HEMIARTHROPLASTY) (Left) Patient reports pain as mild.   Patient is well, and has had no acute complaints or problems  May be able to discharge home with HHPT. PT and Care management to assist with discharge planning. Negative for chest pain and shortness of breath Fever: no Gastrointestinal:Negative for nausea and vomiting  Objective: Vital signs in last 24 hours: Temp:  [97 F (36.1 C)-98.2 F (36.8 C)] 97.6 F (36.4 C) (04/24 0033) Pulse Rate:  [63-72] 63 (04/24 0033) Resp:  [16-20] 16 (04/24 0033) BP: (119-135)/(53-58) 135/58 (04/24 0033) SpO2:  [91 %-98 %] 96 % (04/24 0033)  Intake/Output from previous day:  Intake/Output Summary (Last 24 hours) at 06/30/2019 0726 Last data filed at 06/30/2019 0511 Gross per 24 hour  Intake --  Output 400 ml  Net -400 ml    Intake/Output this shift: No intake/output data recorded.  Labs: Recent Labs    06/27/19 1118 06/28/19 0559 06/29/19 0617 06/30/19 0420  HGB 12.2* 11.3* 9.7* 9.4*   Recent Labs    06/29/19 0617 06/30/19 0420  WBC 13.5* 11.8*  RBC 2.87* 2.74*  HCT 28.5* 26.7*  PLT 122* 122*   Recent Labs    06/29/19 0617 06/30/19 0420  NA 137 134*  K 4.9 4.5  CL 107 103  CO2 23 22  BUN 37* 46*  CREATININE 2.18* 2.46*  GLUCOSE 215* 169*  CALCIUM 8.3* 8.2*   Recent Labs    06/27/19 1118  INR 0.9     EXAM General - Patient is Alert, Appropriate and Oriented Extremity - ABD soft Sensation intact distally Intact pulses distally Dorsiflexion/Plantar flexion intact Incision: dressing C/D/I No cellulitis present Dressing/Incision - clean, dry, no drainage Motor Function - intact, moving foot and toes well on exam.  Abdomen soft with normal BS.  Past Medical History:  Diagnosis Date  . AF (atrial fibrillation) ([place])   . Arthritis    hands  . Chronic kidney disease   . Diabetes mellitus without complication ([place])   .  Hyperlipidemia   . Hypertension   . Prostate enlargement     Assessment/Plan: 2 Days Post-Op Procedure(s) (LRB): ARTHROPLASTY BIPOLAR HIP (HEMIARTHROPLASTY) (Left) Principal Problem:   Closed displaced fracture of left femoral neck (HCC) Active Problems:   Type II diabetes mellitus with renal manifestations (HCC)   CKD (chronic kidney disease) stage 3, GFR 30-59 ml/min   Hyperlipidemia, mixed   Chronic systolic CHF (congestive heart failure), NYHA class 3 (HCC)   TIA (transient ischemic attack)   Essential hypertension   Atrial fibrillation, chronic ([place])   Depression   Fall at home, initial encounter  Estimated body mass index is 27.2 kg/m as calculated from the following:   Height as of this encounter: 5\' 11"  (1.803 m).   Weight as of this encounter: 88.5 kg. Advance diet Up with therapy   Labs reviewed this AM.  Hg 9.4 this AM, Platelets 122. Cr 2.46, patient with CKD Stage III, Increase fluid intake today. Begin working on BM.  Move on to enema if needed today. Up with therapy today.  Upon discharge, follow-up with [facility] in two weeks for staple removal.  DVT Prophylaxis - Lovenox, Foot Pumps and TED hose Weight-Bearing as tolerated to left leg  J. Cameron Proud, PA-C [facility] 06/30/2019, 7:26 AM

## 2019-06-30 NOTE — Anesthesia Postprocedure Evaluation (Signed)
Anesthesia Post Note  Patient: Cody Howard  Procedure(s) Performed: ARTHROPLASTY BIPOLAR HIP (HEMIARTHROPLASTY) (Left Hip)  Patient location during evaluation: PACU Anesthesia Type: Combined General/Spinal Level of consciousness: awake and alert Pain management: pain level controlled Vital Signs Assessment: post-procedure vital signs reviewed and stable Respiratory status: spontaneous breathing, nonlabored ventilation, respiratory function stable and patient connected to nasal cannula oxygen Cardiovascular status: blood pressure returned to baseline and stable Postop Assessment: no apparent nausea or vomiting Anesthetic complications: no     Last Vitals:  Vitals:   06/29/19 1829 06/30/19 0033  BP: (!) 131/55 (!) 135/58  Pulse: 72 63  Resp: 20 16  Temp: 36.8 C 36.4 C  SpO2: 93% 96%    Last Pain:  Vitals:   06/30/19 0033  TempSrc: Oral  PainSc:                  Alphonsus Sias

## 2019-06-30 NOTE — Anesthesia Postprocedure Evaluation (Signed)
Anesthesia Post Note  Patient: Cody Howard  Procedure(s) Performed: ARTHROPLASTY BIPOLAR HIP (HEMIARTHROPLASTY) (Left Hip)  Patient location during evaluation: PACU Anesthesia Type: Combined General/Spinal Level of consciousness: awake and alert Pain management: pain level controlled Vital Signs Assessment: post-procedure vital signs reviewed and stable Respiratory status: spontaneous breathing, nonlabored ventilation, respiratory function stable and patient connected to nasal cannula oxygen Cardiovascular status: blood pressure returned to baseline and stable Postop Assessment: no apparent nausea or vomiting, spinal receding and no headache Anesthetic complications: no     Last Vitals:  Vitals:   06/29/19 1829 06/30/19 0033  BP: (!) 131/55 (!) 135/58  Pulse: 72 63  Resp: 20 16  Temp: 36.8 C 36.4 C  SpO2: 93% 96%    Last Pain:  Vitals:   06/30/19 0033  TempSrc: Oral  PainSc:                  Alphonsus Sias

## 2019-06-30 NOTE — Progress Notes (Signed)
PROGRESS NOTE    HENRYK URSIN  EYC:[id number] DOB: [birth year] DOA: 06/27/2019 PCP: [name], MD    Brief Narrative:  HPI: Cody Howard is a 85 y.o. male with medical history significant of hypertension, hyperlipidemia, diabetes mellitus, TIA, depression, atrial fibrillation not on anticoagulants, CKD stage IIIa, sCHF with EF of 45%, who presents with fall and left hip pain.  Pt states that fell on the side way of his home when he was walking outside to get the paper. He injured his left hip, causing severe pain in left hip. No loss of consciousness.  Strongly denies any head or neck injury.  Refused CT of head and neck.  Patient does not have chest pain, shortness breath, cough.  No nausea vomiting, diarrhea, abdominal pain, symptoms of UTI or unilateral weakness. Of note, pt had laceration to right palm on 4/10,which was sutured up and healing well.   4/22: Seen and examined in postanesthesia care unit. Hemodynamically stable. Little sleepy but easily awakened. Denies pain. Status post hemiarthroplasty with orthopedic surgery for a left hip displaced femoral neck fracture  4/23: Seen and examined.  Remains hemodynamically stable.  Postoperative day #1.  Sitting up in bed.  Answers all questions appropriately.  Per nursing staff patient has some difficulty being directable.  Apparently was attempting to remove clothing and exit the bed without requesting nursing assistance.  Patient educated.  4/24: Seen and examined.  Remains hemodynamically stable.  Postoperative day #2.  Lying in bed.  Answers all questions appropriately.  Still no BM.  Kidney function worsening over interval.   Assessment & Plan:   Principal Problem:   Closed displaced fracture of left femoral neck (HCC) Active Problems:   Type II diabetes mellitus with renal manifestations (HCC)   CKD (chronic kidney disease) stage 3, GFR 30-59 ml/min   Hyperlipidemia, mixed   Chronic systolic CHF (congestive heart  failure), NYHA class 3 (HCC)   TIA (transient ischemic attack)   Essential hypertension   Atrial fibrillation, chronic ([place])   Depression   Fall at home, initial encounter  Closed displaced fracture of left femoral neck ([place])  As evidenced by x-ray.  POD#2 s/p hemiarthroplasty with Dr. Hyman Bower procedure well Ortho following Plan: Multimodal pain control As needed nausea control PT OT today: rec HH Subcutaneous Lovenox.  Per orthopedic surgery will need 2 weeks.  Kidney function continues to worsen will need to find alternative for VTE prophylaxis.  CKD (chronic kidney disease) stage 3a, GFR 30-59 ml/min Renal function worsening over interval. Creatinine 2.46 Hold ACE inhibitor and all other nephrotoxins Hold all diuretics Supplemental IV fluids for 10 hours Recheck creatinine in the morning   Type II diabetes mellitus with renal manifestations (HCC)  Most recent A1c 6.6, well controled.  Patient is taking Metformin at home Home metformin on hold -SSI     Hyperlipidemia, mixed: -statin  Chronic systolic CHF (congestive heart failure), NYHA class 3 ([place])  2D echo on 03/05/2019 showed EF of 45%.   Patient does not have leg edema JVD.  No shortness of breath.  CHF is compensated.   BNP 27 -Continue home dose Lasix and Coreg  TIA (transient ischemic attack): -statin  HTN:  -Continue home medications: Cozaar, Coreg -Patient is also on Lasix -hydralazine prn  Atrial fibrillation, chronic ([place]): Not on anticoagulants.  Heart rate 83 -Cardiac monitor -Continue Coreg  Depression -Continue Zoloft  Fall at home, initial encounter -PT/OT when able to   DVT prophylaxis: SCDs Code Status:  Full Family Communication:  Attempted to call wife, unable to reach Disposition Plan:Status is: Inpatient  Remains inpatient appropriate because:Inpatient level of care appropriate due to severity of illness   Dispo: The patient is from: Home               Anticipated d/c is to: SNF              Anticipated d/c date is: 2 days              Patient currently is not medically stable to d/c.  Currently postop day #2 status post left hip hemiarthroplasty.  Physical therapy and Occupational Therapy on consult.  Physical therapy recommending home with home health.  Chemoprophylaxis started.  Ortho recommending 2 weeks.  Kidney function worsening.  Pending BM.  Anticipate medical readiness for discharge in 24 to 48 hours      Consultants:   Orthopedics  Procedures:   Left hip hemiarthroplasty-06/28/2019  Antimicrobials:   None   Subjective: Seen and examined No complaints  Objective: Vitals:   06/29/19 1707 06/29/19 1829 06/30/19 0033 06/30/19 0801  BP: (!) 134/57 (!) 131/55 (!) 135/58 (!) 141/63  Pulse: 70 72 63 66  Resp: 16 20 16 18   Temp: (!) 97.5 F (36.4 C) 98.2 F (36.8 C) 97.6 F (36.4 C) 98.6 F (37 C)  TempSrc: Oral Axillary Oral Oral  SpO2: 91% 93% 96% 95%  Weight:      Height:        Intake/Output Summary (Last 24 hours) at 06/30/2019 1445 Last data filed at 06/30/2019 1038 Gross per 24 hour  Intake --  Output 900 ml  Net -900 ml   Filed Weights   06/27/19 0950  Weight: 88.5 kg    Examination:  General exam: Appears calm and comfortable  Respiratory system: Clear to auscultation. Respiratory effort normal. Cardiovascular system: S1 & S2 heard, RRR. No JVD, murmurs, rubs, gallops or clicks. No pedal edema. Gastrointestinal system: Abdomen is nondistended, soft and nontender. No organomegaly or masses felt. Normal bowel sounds heard. Central nervous system: Alert and oriented. No focal neurological deficits. Extremities: Lower extremities in immobilization and surgical dressing, not removed Skin: No rashes, lesions or ulcers Psychiatry: Judgement and insight appear normal. Mood & affect appropriate.     Data Reviewed: I have personally reviewed following labs and imaging studies  CBC: Recent Labs   Lab 06/27/19 1118 06/28/19 0559 06/29/19 0617 06/30/19 0420  WBC 9.4 10.6* 13.5* 11.8*  NEUTROABS 7.2  --   --   --   HGB 12.2* 11.3* 9.7* 9.4*  HCT 36.2* 33.6* 28.5* 26.7*  MCV 99.7 97.4 99.3 97.4  PLT 138* 132* 122* 458*   Basic Metabolic Panel: Recent Labs  Lab 06/27/19 1118 06/28/19 0559 06/29/19 0617 06/30/19 0420  NA 139 137 137 134*  K 4.9 5.2* 4.9 4.5  CL 109 105 107 103  CO2 23 24 23 22   GLUCOSE 188* 219* 215* 169*  BUN 30* 32* 37* 46*  CREATININE 2.13* 2.04* 2.18* 2.46*  CALCIUM 8.9 8.7* 8.3* 8.2*   GFR: Estimated Creatinine Clearance: 24.7 mL/min (A) (by C-G formula based on SCr of 2.46 mg/dL (H)). Liver Function Tests: No results for input(s): AST, ALT, ALKPHOS, BILITOT, PROT, ALBUMIN in the last 168 hours. No results for input(s): LIPASE, AMYLASE in the last 168 hours. No results for input(s): AMMONIA in the last 168 hours. Coagulation Profile: Recent Labs  Lab 06/27/19 1118  INR 0.9   Cardiac Enzymes: No  results for input(s): CKTOTAL, CKMB, CKMBINDEX, TROPONINI in the last 168 hours. BNP (last 3 results) No results for input(s): PROBNP in the last 8760 hours. HbA1C: No results for input(s): HGBA1C in the last 72 hours. CBG: Recent Labs  Lab 06/29/19 1207 06/29/19 1709 06/29/19 2154 06/30/19 0802 06/30/19 1208  GLUCAP 190* 260* 131* 164* 205*   Lipid Profile: No results for input(s): CHOL, HDL, LDLCALC, TRIG, CHOLHDL, LDLDIRECT in the last 72 hours. Thyroid Function Tests: No results for input(s): TSH, T4TOTAL, FREET4, T3FREE, THYROIDAB in the last 72 hours. Anemia Panel: No results for input(s): VITAMINB12, FOLATE, FERRITIN, TIBC, IRON, RETICCTPCT in the last 72 hours. Sepsis Labs: No results for input(s): PROCALCITON, LATICACIDVEN in the last 168 hours.  Recent Results (from the past 240 hour(s))  Respiratory Panel by RT PCR (Flu A&B, Covid) - Nasopharyngeal Swab     Status: None   Collection Time: 06/27/19 11:54 AM   Specimen:  Nasopharyngeal Swab  Result Value Ref Range Status   SARS Coronavirus 2 by RT PCR NEGATIVE NEGATIVE Final    Comment: (NOTE) SARS-CoV-2 target nucleic acids are NOT DETECTED. The SARS-CoV-2 RNA is generally detectable in upper respiratoy specimens during the acute phase of infection. The lowest concentration of SARS-CoV-2 viral copies this assay can detect is 131 copies/mL. A negative result does not preclude SARS-Cov-2 infection and should not be used as the sole basis for treatment or other patient management decisions. A negative result may occur with  improper specimen collection/handling, submission of specimen other than nasopharyngeal swab, presence of viral mutation(s) within the areas targeted by this assay, and inadequate number of viral copies (<131 copies/mL). A negative result must be combined with clinical observations, patient history, and epidemiological information. The expected result is Negative. Fact Sheet for Patients:  PinkCheek.be Fact Sheet for Healthcare Providers:  GravelBags.it This test is not yet ap proved or cleared by the [place] FDA and  has been authorized for detection and/or diagnosis of SARS-CoV-2 by FDA under an Emergency Use Authorization (EUA). This EUA will remain  in effect (meaning this test can be used) for the duration of the COVID-19 declaration under Section 564(b)(1) of the Act, 21 U.S.C. section 360bbb-3(b)(1), unless the authorization is terminated or revoked sooner.    Influenza A by PCR NEGATIVE NEGATIVE Final   Influenza B by PCR NEGATIVE NEGATIVE Final    Comment: (NOTE) The Xpert Xpress SARS-CoV-2/FLU/RSV assay is intended as an aid in  the diagnosis of influenza from Nasopharyngeal swab specimens and  should not be used as a sole basis for treatment. Nasal washings and  aspirates are unacceptable for Xpert Xpress SARS-CoV-2/FLU/RSV  testing. Fact Sheet for  Patients: PinkCheek.be Fact Sheet for Healthcare Providers: GravelBags.it This test is not yet approved or cleared by the [place] FDA and  has been authorized for detection and/or diagnosis of SARS-CoV-2 by  FDA under an Emergency Use Authorization (EUA). This EUA will remain  in effect (meaning this test can be used) for the duration of the  Covid-19 declaration under Section 564(b)(1) of the Act, 21  U.S.C. section 360bbb-3(b)(1), unless the authorization is  terminated or revoked. Performed at [facility], [address] [place], [place] [address]   Surgical pcr screen     Status: None   Collection Time: 06/27/19  5:41 PM   Specimen: Nasal Mucosa; Nasal Swab  Result Value Ref Range Status   MRSA, PCR NEGATIVE NEGATIVE Final   Staphylococcus aureus NEGATIVE NEGATIVE Final  Comment: (NOTE) The Xpert SA Assay (FDA approved for NASAL specimens in patients 38 years of age and older), is one component of a comprehensive surveillance program. It is not intended to diagnose infection nor to guide or monitor treatment. Performed at [facility], [address] [place], [place] [address]          Radiology Studies: DG HIP UNILAT W OR W/O PELVIS 2-3 VIEWS LEFT  Result Date: 06/28/2019 CLINICAL DATA:  Postoperative left hip arthroplasty EXAM: DG HIP (WITH OR WITHOUT PELVIS) 2-3V LEFT COMPARISON:  Radiograph 06/27/2019 FINDINGS: There are postsurgical changes from left hip hemiarthroplasty with resection of the left femoral head and neck and placement of an articulating femoral stem. Alignment is within expected normals. Expected postsurgical soft tissue changes are noted with overlying swelling and intra-articular gas. No acute complication is evident. IMPRESSION: Satisfactory postoperative appearance status post left hip hemiarthroplasty. Electronically Signed   By: Lovena Le M.D.   On: 06/28/2019  17:06        Scheduled Meds: . atorvastatin  40 mg Oral Daily  . carvedilol  3.125 mg Oral BID WC  . cephALEXin  500 mg Oral BID  . docusate sodium  100 mg Oral BID  . donepezil  10 mg Oral Daily  . [START ON 07/01/2019] enoxaparin (LOVENOX) injection  30 mg Subcutaneous Q24H  . insulin aspart  0-9 Units Subcutaneous TID AC & HS  . melatonin  5 mg Oral QHS  . omega-3 acid ethyl esters  1 g Oral Daily  . Ensure Max Protein  11 oz Oral BID BM  . sertraline  25 mg Oral Daily  . sodium chloride flush  10 mL Intravenous Q8H  . tamsulosin  0.4 mg Oral Daily  . trimethoprim-polymyxin b  1 drop Both Eyes Q4H   Continuous Infusions: . sodium chloride    . sodium chloride 75 mL/hr at 06/30/19 1026  .  ceFAZolin (ANCEF) IV       LOS: 3 days    Time spent: 35 min    Sidney Ace, MD [facility] Pager [phone]  If 7PM-7AM, please contact night-coverage 06/30/2019, 2:45 PM

## 2019-06-30 NOTE — Evaluation (Signed)
Occupational Therapy Evaluation Patient Details Name: Cody Howard MRN: [id number] DOB: [birth year] Today's Date: 06/30/2019    History of Present Illness Pt admitted for L femoral neck fx secondary to falls s/p unipolar hemiarthoplasty POD 1 at time of evaluation. HIstory includes HTN, DM, TIA, depression, Afib, and CKD 3.   Clinical Impression   Cody Howard was seen for OT evaluation this date. Prior to hospital admission, pt was independent in all mobility and ADLs. Pt lives c wife who is available 24/7 but has concerns re: her ability to provide physical assistance upon d/c home. Pt presents to acute OT demonstrating impaired ADL performance and functional mobility 2/2 decreased safety awareness, functional ROM/strength/endurance deficits, and pain. Pt currently requires SETUP + VCs to maintain posterior hip pcns for seated UB ADL tasks. MAX A for LB access at bed level and seated in chair. At start of session, pt independently verbalized 1/3 hip pcns, required VCs for remaining 2/3. Pt required MOD A sup>sit from a flat bed; CGA + RW for OOB in room mobility. Pt would benefit from skilled OT to address noted impairments and functional limitations (see below for any additional details) in order to maximize safety and independence while minimizing falls risk and caregiver burden. Upon hospital discharge, recommend HHOT to maximize pt safety and return to functional independence during meaningful occupations of daily life.     Follow Up Recommendations  Home health OT;Supervision/Assistance - 24 hour    Equipment Recommendations  3 in 1 bedside commode    Recommendations for Other Services       Precautions / Restrictions Precautions Precautions: Posterior Hip;Fall Restrictions Weight Bearing Restrictions: Yes LLE Weight Bearing: Weight bearing as tolerated      Mobility Bed Mobility Overal bed mobility: Needs Assistance Bed Mobility: Supine to Sit     Supine to sit: Mod  assist(flat bed)     General bed mobility comments: Assist to advance LLE and for trunk flexion from flat bed  Transfers Overall transfer level: Needs assistance Equipment used: Rolling walker (2 wheeled) Transfers: Sit to/from [facility] Sit to Stand: Min assist Stand pivot transfers: Min guard       General transfer comment: VCs for technique to maintain post hip pcns.    Balance Overall balance assessment: Needs assistance;History of Falls Sitting-balance support: Feet supported;Bilateral upper extremity supported Sitting balance-Leahy Scale: Good     Standing balance support: Bilateral upper extremity supported Standing balance-Leahy Scale: Fair                             ADL either performed or assessed with clinical judgement   ADL Overall ADL's : Needs assistance/impaired                                       General ADL Comments: SETUP + MIN VCs for maintaining hip precautions self-feeding seated in chair. MAX A doff B SCDs at bed level and adjust B socks seated EOB. MIN A UBD seated EOB. CGA + RW + MIN VCs simulated toilet t/f.      Vision Baseline Vision/History: Wears glasses Wears Glasses: Reading only       Perception     Praxis      Pertinent Vitals/Pain Pain Assessment: Faces Faces Pain Scale: Hurts little more Pain Location: L hip Pain Descriptors / Indicators: Operative site guarding  Pain Intervention(s): Limited activity within patient's tolerance;Repositioned     Hand Dominance     Extremity/Trunk Assessment Upper Extremity Assessment Upper Extremity Assessment: Overall WFL for tasks assessed   Lower Extremity Assessment Lower Extremity Assessment: Generalized weakness       Communication Communication Communication: No difficulties   Cognition Arousal/Alertness: Awake/alert Behavior During Therapy: WFL for tasks assessed/performed Overall Cognitive Status: Within Functional Limits for  tasks assessed                                     General Comments       Exercises Other Exercises Other Exercises: Pt and caregiver educated re: d/c recommendations, DME recommendations, AE for LBD, functional application of posterior hip pcns, home/routine modifications, energy conservation, falls prevention Other Exercises: Self-feeding, LBD, UBD, sup>sit, sit<>stand, SPT, RW technique   Shoulder Instructions      Home Living Family/patient expects to be discharged to:: Private residence Living Arrangements: Spouse/significant other Available Help at Discharge: Family;Available 24 hours/day Type of Home: House Home Access: Stairs to enter [employer] of Steps: 2 Entrance Stairs-Rails: None Home Layout: One level     Bathroom Shower/Tub: Occupational psychologist: Handicapped height     Home Equipment: Cane - single point;Grab bars - tub/shower;Hand held shower head          Prior Functioning/Environment Level of Independence: Independent                 OT Problem List: Decreased strength;Decreased range of motion;Impaired balance (sitting and/or standing);Decreased activity tolerance;Decreased knowledge of use of DME or AE;Decreased knowledge of precautions      OT Treatment/Interventions: Self-care/ADL training;Therapeutic exercise;Energy conservation;DME and/or AE instruction;Therapeutic activities;Patient/family education;Balance training    OT Goals(Current goals can be found in the care plan section) Acute Rehab OT Goals Patient Stated Goal: to go home OT Goal Formulation: With patient/family Time For Goal Achievement: 07/14/19 Potential to Achieve Goals: Good ADL Goals Pt Will Perform Grooming: with modified independence;sitting(c no cues to maintain hip pcns) Pt Will Perform Lower Body Dressing: with set-up;with min assist;with adaptive equipment;with caregiver independent in assisting;sit to/from stand(c LRAD & AE  PRN) Pt Will Transfer to Toilet: with modified independence;ambulating;regular height toilet(c LRAD PRN) Additional ADL Goal #1: Pt will independently verbalize planto implement x3 strategies for falls prevention  OT Frequency: Min 2X/week   Barriers to D/C: Inaccessible home environment;Decreased caregiver support          Co-evaluation              AM-PAC OT "6 Clicks" Daily Activity     Outcome Measure Help from another person eating meals?: A Little Help from another person taking care of personal grooming?: A Little Help from another person toileting, which includes using toliet, bedpan, or urinal?: A Lot Help from another person bathing (including washing, rinsing, drying)?: A Lot Help from another person to put on and taking off regular upper body clothing?: A Little Help from another person to put on and taking off regular lower body clothing?: A Lot 6 Click Score: 15   End of Session Equipment Utilized During Treatment: Rolling walker  Activity Tolerance: Patient tolerated treatment well Patient left: in chair;with call bell/phone within reach;with chair alarm set;with family/visitor present  OT Visit Diagnosis: Unsteadiness on feet (R26.81);Other abnormalities of gait and mobility (R26.89)  Time: 0097-9499 OT Time Calculation (min): 33 min Charges:  OT General Charges $OT Visit: 1 Visit OT Evaluation $OT Eval Moderate Complexity: 1 Mod OT Treatments $Self Care/Home Management : 23-37 mins  Dessie Coma, M.S. OTR/L  06/30/19, 9:49 AM

## 2019-07-01 ENCOUNTER — Inpatient Hospital Stay: Payer: Medicare HMO

## 2019-07-01 LAB — GLUCOSE, CAPILLARY
Glucose-Capillary: 179 mg/dL — ABNORMAL HIGH (ref 70–99)
Glucose-Capillary: 219 mg/dL — ABNORMAL HIGH (ref 70–99)
Glucose-Capillary: 181 mg/dL — ABNORMAL HIGH (ref 70–99)

## 2019-07-01 LAB — BASIC METABOLIC PANEL
Anion gap: 9 (ref 5–15)
BUN: 48 mg/dL — ABNORMAL HIGH (ref 8–23)
CO2: 23 mmol/L (ref 22–32)
Calcium: 8.3 mg/dL — ABNORMAL LOW (ref 8.9–10.3)
Chloride: 105 mmol/L (ref 98–111)
Creatinine, Ser: 2.06 mg/dL — ABNORMAL HIGH (ref 0.61–1.24)
GFR calc Af Amer: 34 mL/min — ABNORMAL LOW (ref >60)
GFR calc non Af Amer: 29 mL/min — ABNORMAL LOW (ref >60)
Glucose, Bld: 178 mg/dL — ABNORMAL HIGH (ref 70–99)
Potassium: 4.5 mmol/L (ref 3.5–5.1)
Sodium: 137 mmol/L (ref 135–145)

## 2019-07-01 MED ORDER — APIXABAN 2.5 MG PO TABS
2.5000 mg | ORAL_TABLET | Freq: Two times a day (BID) | ORAL | 0 refills | Status: DC
Start: 2019-07-01 — End: 2021-02-02

## 2019-07-01 NOTE — Progress Notes (Signed)
Called to pt's room by PT.  Per PT pt set at the side of the bed and became very sweaty.  On assessment pt diaphoretic, reports generalized weakness and nauseated.  BP 182/73. Rechecked BP 168/66. Rest of VSS.  Denies chest pain or headache. Zofran given for nausea.  Dr. Priscella Mann notified of the above.  No new orders .

## 2019-07-01 NOTE — Progress Notes (Signed)
PROGRESS NOTE    VERE Cody Howard  ELF:[id number] DOB: 02/04/1935 DOA: 06/27/2019 PCP: [name], MD    Brief Narrative:  HPI: Cody Howard is a 85 y.o. male with medical history significant of hypertension, hyperlipidemia, diabetes mellitus, TIA, depression, atrial fibrillation not on anticoagulants, CKD stage IIIa, sCHF with EF of 45%, who presents with fall and left hip pain.  Pt states that fell on the side way of his home when he was walking outside to get the paper. He injured his left hip, causing severe pain in left hip. No loss of consciousness.  Strongly denies any head or neck injury.  Refused CT of head and neck.  Patient does not have chest pain, shortness breath, cough.  No nausea vomiting, diarrhea, abdominal pain, symptoms of UTI or unilateral weakness. Of note, pt had laceration to right palm on 4/10,which was sutured up and healing well.   4/22: Seen and examined in postanesthesia care unit. Hemodynamically stable. Little sleepy but easily awakened. Denies pain. Status post hemiarthroplasty with orthopedic surgery for a left hip displaced femoral neck fracture  4/23: Seen and examined.  Remains hemodynamically stable.  Postoperative day #1.  Sitting up in bed.  Answers all questions appropriately.  Per nursing staff patient has some difficulty being directable.  Apparently was attempting to remove clothing and exit the bed without requesting nursing assistance.  Patient educated.  4/24: Seen and examined.  Remains hemodynamically stable.  Postoperative day #2.  Lying in bed.  Answers all questions appropriately.  Still no BM.  Kidney function worsening over interval.  4/25: Seen and examined.  Remains HD stable.  POD #3.  Small BM yesterday.  Episode this AM of diaphoresis, nausea upon rising from lying position.  Now improved.   Assessment & Plan:   Principal Problem:   Closed displaced fracture of left femoral neck (HCC) Active Problems:   Type II diabetes  mellitus with renal manifestations (HCC)   CKD (chronic kidney disease) stage 3, GFR 30-59 ml/min   Hyperlipidemia, mixed   Chronic systolic CHF (congestive heart failure), NYHA class 3 (HCC)   TIA (transient ischemic attack)   Essential hypertension   Atrial fibrillation, chronic (Cody Howard)   Depression   Fall at home, initial encounter  Closed displaced fracture of left femoral neck ([place])  As evidenced by x-ray.  POD#3 s/p hemiarthroplasty with Dr. Hyman Bower procedure well Ortho following Plan: Multimodal pain control As needed nausea control PT OT today: rec HH Subcutaneous Lovenox.   Switch to Eliquis 2.5 BID on discharge.  Complete 14 day course  CKD (chronic kidney disease) stage 3a, GFR 30-59 ml/min Creatinine back at baseline No further IVF indicated   Type II diabetes mellitus with renal manifestations (Cody Howard)  Most recent A1c 6.6, well controled.  Patient is taking Metformin at home Home metformin on hold -SSI  Hyperlipidemia, mixed: -statin  Chronic systolic CHF (congestive heart failure), NYHA class 3 (Cody Howard)  2D echo on 03/05/2019 showed EF of 45%.   Patient does not have leg edema JVD.  No shortness of breath.  CHF is compensated.   BNP 27 -Continue home dose Lasix and Coreg  TIA (transient ischemic attack): -statin  HTN:  -Continue home medications: Cozaar, Coreg -Patient is also on Lasix -hydralazine prn  Atrial fibrillation, chronic (Cody Howard): Not on anticoagulants.  Heart rate 83 -Cardiac monitor -Continue Coreg  Depression -Continue Zoloft  Fall at home, initial encounter -PT/OT when able to   DVT prophylaxis: SCDs Code Status:  Full Family Communication:  Attempted to call wife, unable to reach Disposition Plan:Status is: Inpatient Status is: Inpatient  Remains inpatient appropriate because:Inpatient level of care appropriate due to severity of illness   Dispo: The patient is from: Home              Anticipated d/c is to:  Home              Anticipated d/c date is: 1 day              Patient currently is not medically stable to d/c.   Still symptomatic post operatively.  Episode of diaphoresis, nausea and vomiting this AM.  Unclear etiology.  Only small BM recorded.  Concern about post op ileus.  KUB ordered, continue with bowel regimen.  Can possibly dc tomorrow AM if symptoms improved.  Consultants:   Orthopedics  Procedures:   Left hip hemiarthroplasty-06/28/2019  Antimicrobials:   None   Subjective: Seen and examined No acute overnight events  Objective: Vitals:   06/30/19 2339 07/01/19 0812 07/01/19 0924 07/01/19 0926  BP: 140/60 (!) 158/68 (!) 182/73 (!) 168/66  Pulse: 66 71 70 70  Resp: 18 18 17    Temp: 98 F (36.7 C) 98.9 F (37.2 C) 98.3 F (36.8 C)   TempSrc: Oral Oral Oral   SpO2: 94% 93% 93%   Weight:      Height:        Intake/Output Summary (Last 24 hours) at 07/01/2019 1443 Last data filed at 07/01/2019 1006 Gross per 24 hour  Intake 1208.98 ml  Output 1050 ml  Net 158.98 ml   Filed Weights   06/27/19 0950  Weight: 88.5 kg    Examination:  General exam: Appears calm and comfortable  Respiratory system: Clear to auscultation. Respiratory effort normal. Cardiovascular system: S1 & S2 heard, RRR. No JVD, murmurs, rubs, gallops or clicks. No pedal edema. Gastrointestinal system: Abdomen is nondistended, soft and nontender. No organomegaly or masses felt. Normal bowel sounds heard. Central nervous system: Alert and oriented. No focal neurological deficits. Extremities: Lower extremities in immobilization and surgical dressing, not removed Skin: No rashes, lesions or ulcers Psychiatry: Judgement and insight appear normal. Mood & affect appropriate.     Data Reviewed: I have personally reviewed following labs and imaging studies  CBC: Recent Labs  Lab 06/27/19 1118 06/28/19 0559 06/29/19 0617 06/30/19 0420  WBC 9.4 10.6* 13.5* 11.8*  NEUTROABS 7.2  --   --    --   HGB 12.2* 11.3* 9.7* 9.4*  HCT 36.2* 33.6* 28.5* 26.7*  MCV 99.7 97.4 99.3 97.4  PLT 138* 132* 122* 416*   Basic Metabolic Panel: Recent Labs  Lab 06/27/19 1118 06/28/19 0559 06/29/19 0617 06/30/19 0420 07/01/19 0415  NA 139 137 137 134* 137  K 4.9 5.2* 4.9 4.5 4.5  CL 109 105 107 103 105  CO2 23 24 23 22 23   GLUCOSE 188* 219* 215* 169* 178*  BUN 30* 32* 37* 46* 48*  CREATININE 2.13* 2.04* 2.18* 2.46* 2.06*  CALCIUM 8.9 8.7* 8.3* 8.2* 8.3*   GFR: Estimated Creatinine Clearance: 29.4 mL/min (A) (by C-G formula based on SCr of 2.06 mg/dL (H)). Liver Function Tests: No results for input(s): AST, ALT, ALKPHOS, BILITOT, PROT, ALBUMIN in the last 168 hours. No results for input(s): LIPASE, AMYLASE in the last 168 hours. No results for input(s): AMMONIA in the last 168 hours. Coagulation Profile: Recent Labs  Lab 06/27/19 1118  INR 0.9   Cardiac Enzymes: No  results for input(s): CKTOTAL, CKMB, CKMBINDEX, TROPONINI in the last 168 hours. BNP (last 3 results) No results for input(s): PROBNP in the last 8760 hours. HbA1C: No results for input(s): HGBA1C in the last 72 hours. CBG: Recent Labs  Lab 06/30/19 0802 06/30/19 1208 06/30/19 1639 06/30/19 2103 07/01/19 1312  GLUCAP 164* 205* 153* 169* 179*   Lipid Profile: No results for input(s): CHOL, HDL, LDLCALC, TRIG, CHOLHDL, LDLDIRECT in the last 72 hours. Thyroid Function Tests: No results for input(s): TSH, T4TOTAL, FREET4, T3FREE, THYROIDAB in the last 72 hours. Anemia Panel: No results for input(s): VITAMINB12, FOLATE, FERRITIN, TIBC, IRON, RETICCTPCT in the last 72 hours. Sepsis Labs: No results for input(s): PROCALCITON, LATICACIDVEN in the last 168 hours.  Recent Results (from the past 240 hour(s))  Respiratory Panel by RT PCR (Flu A&B, Covid) - Nasopharyngeal Swab     Status: None   Collection Time: 06/27/19 11:54 AM   Specimen: Nasopharyngeal Swab  Result Value Ref Range Status   SARS Coronavirus 2  by RT PCR NEGATIVE NEGATIVE Final    Comment: (NOTE) SARS-CoV-2 target nucleic acids are NOT DETECTED. The SARS-CoV-2 RNA is generally detectable in upper respiratoy specimens during the acute phase of infection. The lowest concentration of SARS-CoV-2 viral copies this assay can detect is 131 copies/mL. A negative result does not preclude SARS-Cov-2 infection and should not be used as the sole basis for treatment or other patient management decisions. A negative result may occur with  improper specimen collection/handling, submission of specimen other than nasopharyngeal swab, presence of viral mutation(s) within the areas targeted by this assay, and inadequate number of viral copies (<131 copies/mL). A negative result must be combined with clinical observations, patient history, and epidemiological information. The expected result is Negative. Fact Sheet for Patients:  PinkCheek.be Fact Sheet for Healthcare Providers:  GravelBags.it This test is not yet ap proved or cleared by the [place] FDA and  has been authorized for detection and/or diagnosis of SARS-CoV-2 by FDA under an Emergency Use Authorization (EUA). This EUA will remain  in effect (meaning this test can be used) for the duration of the COVID-19 declaration under Section 564(b)(1) of the Act, 21 U.S.C. section 360bbb-3(b)(1), unless the authorization is terminated or revoked sooner.    Influenza A by PCR NEGATIVE NEGATIVE Final   Influenza B by PCR NEGATIVE NEGATIVE Final    Comment: (NOTE) The Xpert Xpress SARS-CoV-2/FLU/RSV assay is intended as an aid in  the diagnosis of influenza from Nasopharyngeal swab specimens and  should not be used as a sole basis for treatment. Nasal washings and  aspirates are unacceptable for Xpert Xpress SARS-CoV-2/FLU/RSV  testing. Fact Sheet for Patients: PinkCheek.be Fact Sheet for Healthcare  Providers: GravelBags.it This test is not yet approved or cleared by the [place] FDA and  has been authorized for detection and/or diagnosis of SARS-CoV-2 by  FDA under an Emergency Use Authorization (EUA). This EUA will remain  in effect (meaning this test can be used) for the duration of the  Covid-19 declaration under Section 564(b)(1) of the Act, 21  U.S.C. section 360bbb-3(b)(1), unless the authorization is  terminated or revoked. Performed at [facility], [address] [place], [place] [address]   Surgical pcr screen     Status: None   Collection Time: 06/27/19  5:41 PM   Specimen: Nasal Mucosa; Nasal Swab  Result Value Ref Range Status   MRSA, PCR NEGATIVE NEGATIVE Final   Staphylococcus aureus NEGATIVE NEGATIVE Final  Comment: (NOTE) The Xpert SA Assay (FDA approved for NASAL specimens in patients 72 years of age and older), is one component of a comprehensive surveillance program. It is not intended to diagnose infection nor to guide or monitor treatment. Performed at [facility], [address] [place], [place] [address]          Radiology Studies: No results found.      Scheduled Meds: . atorvastatin  40 mg Oral Daily  . carvedilol  3.125 mg Oral BID WC  . docusate sodium  100 mg Oral BID  . donepezil  10 mg Oral Daily  . enoxaparin (LOVENOX) injection  30 mg Subcutaneous Q24H  . insulin aspart  0-9 Units Subcutaneous TID AC & HS  . melatonin  5 mg Oral QHS  . omega-3 acid ethyl esters  1 g Oral Daily  . Ensure Max Protein  11 oz Oral BID BM  . sertraline  25 mg Oral Daily  . sodium chloride flush  10 mL Intravenous Q8H  . tamsulosin  0.4 mg Oral Daily  . trimethoprim-polymyxin b  1 drop Both Eyes Q4H   Continuous Infusions: . sodium chloride    .  ceFAZolin (ANCEF) IV       LOS: 4 days    Time spent: 35 min    Sidney Ace, MD [facility] Pager [phone]  If  7PM-7AM, please contact night-coverage 07/01/2019, 2:43 PM

## 2019-07-01 NOTE — Progress Notes (Signed)
PT Cancellation Note  Patient Details Name: Cody Howard MRN: [id number] DOB: November 09, 1934   Cancelled Treatment:    Reason Eval/Treat Not Completed: Patient at procedure or test/unavailable   Out of room at x-ray on attempt.    Chesley Noon 07/01/2019, 11:53 AM

## 2019-07-01 NOTE — Progress Notes (Signed)
Subjective: 3 Days Post-Op Procedure(s) (LRB): ARTHROPLASTY BIPOLAR HIP (HEMIARTHROPLASTY) (Left) Patient reports pain as mild.   Patient is well, and has had no acute complaints or problems  May be able to discharge home with HHPT. PT and Care management to assist with discharge planning. Negative for chest pain and shortness of breath Fever: no Gastrointestinal:Negative for nausea and vomiting Patient reports that he has had a small BM.  Objective: Vital signs in last 24 hours: Temp:  [98 F (36.7 C)-98.6 F (37 C)] 98 F (36.7 C) (04/24 2339) Pulse Rate:  [64-66] 66 (04/24 2339) Resp:  [18] 18 (04/24 2339) BP: (128-141)/(56-63) 140/60 (04/24 2339) SpO2:  [94 %-95 %] 94 % (04/24 2339)  Intake/Output from previous day:  Intake/Output Summary (Last 24 hours) at 07/01/2019 0737 Last data filed at 07/01/2019 0440 Gross per 24 hour  Intake 968.98 ml  Output 1850 ml  Net -881.02 ml    Intake/Output this shift: No intake/output data recorded.  Labs: Recent Labs    06/29/19 0617 06/30/19 0420  HGB 9.7* 9.4*   Recent Labs    06/29/19 0617 06/30/19 0420  WBC 13.5* 11.8*  RBC 2.87* 2.74*  HCT 28.5* 26.7*  PLT 122* 122*   Recent Labs    06/30/19 0420 07/01/19 0415  NA 134* 137  K 4.5 4.5  CL 103 105  CO2 22 23  BUN 46* 48*  CREATININE 2.46* 2.06*  GLUCOSE 169* 178*  CALCIUM 8.2* 8.3*   No results for input(s): LABPT, INR in the last 72 hours.   EXAM General - Patient is Alert, Appropriate and Oriented Extremity - ABD soft Sensation intact distally Intact pulses distally Dorsiflexion/Plantar flexion intact Incision: dressing C/D/I No cellulitis present Dressing/Incision - clean, dry, no drainage Motor Function - intact, moving foot and toes well on exam.  Abdomen soft with very active bowel sounds. Negative Homans to bilateral lower extremities.  Past Medical History:  Diagnosis Date  . AF (atrial fibrillation) ([place])   . Arthritis    hands  .  Chronic kidney disease   . Diabetes mellitus without complication ([place])   . Hyperlipidemia   . Hypertension   . Prostate enlargement     Assessment/Plan: 3 Days Post-Op Procedure(s) (LRB): ARTHROPLASTY BIPOLAR HIP (HEMIARTHROPLASTY) (Left) Principal Problem:   Closed displaced fracture of left femoral neck (HCC) Active Problems:   Type II diabetes mellitus with renal manifestations (HCC)   CKD (chronic kidney disease) stage 3, GFR 30-59 ml/min   Hyperlipidemia, mixed   Chronic systolic CHF (congestive heart failure), NYHA class 3 (HCC)   TIA (transient ischemic attack)   Essential hypertension   Atrial fibrillation, chronic ([place])   Depression   Fall at home, initial encounter  Estimated body mass index is 27.2 kg/m as calculated from the following:   Height as of this encounter: 5\' 11"  (1.803 m).   Weight as of this encounter: 88.5 kg. Advance diet Up with therapy   Labs reviewed this AM.  Cr back down to 2.06. Will plan on continuing adjusted dose of Lovenox while here in the hospital, however will discharge home on Elquis 2.5mg  twice daily for DVT prevention as this does not require a dose adjustment in kidney disease. Continue with PT. Patient has had a small BM.  Upon discharge, follow-up with [place] in two weeks for staple removal. Continue Eliquis 2.5mg  twice daily for 14 days upon discharge.  DVT Prophylaxis - Lovenox, Foot Pumps and TED hose Weight-Bearing as tolerated to left leg  Raquel Kaelyn Innocent, PA-C [facility] Orthopaedic Surgery 07/01/2019, 7:37 AM

## 2019-07-01 NOTE — Plan of Care (Signed)
  Problem: Education: Goal: Verbalization of understanding the information provided (i.e., activity precautions, restrictions, etc) will improve Outcome: Progressing   Problem: Activity: Goal: Ability to ambulate and perform ADLs will improve Outcome: Progressing   Problem: Clinical Measurements: Goal: Postoperative complications will be avoided or minimized Outcome: Progressing   Problem: Pain Management: Goal: Pain level will decrease Outcome: Progressing   

## 2019-07-01 NOTE — Progress Notes (Signed)
Physical Therapy Treatment Patient Details Name: Cody Howard MRN: [id number] DOB: Mar 14, 1934 Today's Date: 07/01/2019    History of Present Illness Pt admitted for L femoral neck fx secondary to falls s/p unipolar hemiarthoplasty POD 1 at time of evaluation. HIstory includes HTN, DM, TIA, depression, Afib, and CKD 3.    PT Comments    Pt in bed, feeling better today.  Requests to get up to bathroom this am.  To EOB with mod a x 1 and verbal cues for sequencing.  Once sitting he is steady.  Pt noted to be sweaty and asks for emesis bucket. Some dry heaving noted but did not expel anything.  After sitting for a few moments, increased sweating noted with beads of sweat covering head and face.  He returned to supine with min a x 1.  RN notified and promptly comes in to check - see flow sheets for vitals.  BP elevated but within limits.  Cold wash cloth given.  Wife arrives during incident and seems to allude he passes out on occasion at home "You need to be truthful" to pt and seems rattled but he denies episodes at home.  She does not expand on her concerns.  Pt left in supine to rest.  Will return later to check and progress mobility/ex as appropriate.   Follow Up Recommendations  Home health PT     Equipment Recommendations  Rolling walker with 5" wheels;3in1 (PT)    Recommendations for Other Services       Precautions / Restrictions Precautions Precautions: Posterior Hip;Fall Restrictions Weight Bearing Restrictions: Yes LLE Weight Bearing: Weight bearing as tolerated    Mobility  Bed Mobility Overal bed mobility: Needs Assistance Bed Mobility: Supine to Sit;Sit to Supine     Supine to sit: Mod assist Sit to supine: Min assist      Transfers                 General transfer comment: deferred  Ambulation/Gait                 Stairs             Wheelchair Mobility    Modified Rankin (Stroke Patients Only)       Balance Overall balance  assessment: Needs assistance Sitting-balance support: Feet supported Sitting balance-Leahy Scale: Good                                      Cognition Arousal/Alertness: Awake/alert Behavior During Therapy: WFL for tasks assessed/performed Overall Cognitive Status: Within Functional Limits for tasks assessed                                        Exercises      General Comments        Pertinent Vitals/Pain Pain Assessment: Faces Faces Pain Scale: Hurts a little bit Pain Location: L hip Pain Descriptors / Indicators: Operative site guarding Pain Intervention(s): Limited activity within patient's tolerance;Monitored during session    Home Living                      Prior Function            PT Goals (current goals can now be found in the care plan section) Progress towards PT goals: Not  progressing toward goals - comment    Frequency    BID      PT Plan Current plan remains appropriate    Co-evaluation              AM-PAC PT "6 Clicks" Mobility   Outcome Measure  Help needed turning from your back to your side while in a flat bed without using bedrails?: A Little Help needed moving from lying on your back to sitting on the side of a flat bed without using bedrails?: A Little Help needed moving to and from a bed to a chair (including a wheelchair)?: A Little Help needed standing up from a chair using your arms (e.g., wheelchair or bedside chair)?: A Little Help needed to walk in hospital room?: A Little Help needed climbing 3-5 steps with a railing? : A Little 6 Click Score: 18    End of Session   Activity Tolerance: Treatment limited secondary to medical complications (Comment) Patient left: in bed;with call bell/phone within reach;with bed alarm set;with family/visitor present Nurse Communication: Mobility status;Other (comment)(response to mobility) Pain - Right/Left: Left Pain - part of body: Hip      Time: 0912-0935 PT Time Calculation (min) (ACUTE ONLY): 23 min  Charges:  $Therapeutic Activity: 23-37 mins                    Chesley Noon, PTA 07/01/19, 9:41 AM

## 2019-07-02 LAB — GLUCOSE, CAPILLARY
Glucose-Capillary: 179 mg/dL — ABNORMAL HIGH (ref 70–99)
Glucose-Capillary: 251 mg/dL — ABNORMAL HIGH (ref 70–99)
Glucose-Capillary: 228 mg/dL — ABNORMAL HIGH (ref 70–99)
Glucose-Capillary: 196 mg/dL — ABNORMAL HIGH (ref 70–99)
Glucose-Capillary: 174 mg/dL — ABNORMAL HIGH (ref 70–99)

## 2019-07-02 LAB — SURGICAL PATHOLOGY

## 2019-07-02 MED ORDER — ONDANSETRON HCL 4 MG PO TABS: 4.0000 mg | ORAL_TABLET | Freq: Four times a day (QID) | ORAL | 0 refills | Status: DC | PRN

## 2019-07-02 NOTE — Progress Notes (Signed)
  Subjective: 4 Days Post-Op Procedure(s) (LRB): ARTHROPLASTY BIPOLAR HIP (HEMIARTHROPLASTY) (Left) Patient reports pain as mild.   Patient is well, and has had no acute complaints or problems  May be able to discharge home with HHPT. PT and Care management to assist with discharge planning. Negative for chest pain and shortness of breath Fever: no Gastrointestinal: Did report some nausea yesterday. Patient had a large bowel movement yesterday, feels better after BM.  Objective: Vital signs in last 24 hours: Temp:  [97.6 F (36.4 C)-98.9 F (37.2 C)] 97.6 F (36.4 C) (04/26 0013) Pulse Rate:  [62-71] 68 (04/26 0340) Resp:  [16-18] 17 (04/26 0013) BP: (157-182)/(61-78) 163/72 (04/26 0340) SpO2:  [93 %-95 %] 95 % (04/26 0340)  Intake/Output from previous day:  Intake/Output Summary (Last 24 hours) at 07/02/2019 0753 Last data filed at 07/02/2019 0414 Gross per 24 hour  Intake 720 ml  Output 800 ml  Net -80 ml    Intake/Output this shift: No intake/output data recorded.  Labs: Recent Labs    06/30/19 0420  HGB 9.4*   Recent Labs    06/30/19 0420  WBC 11.8*  RBC 2.74*  HCT 26.7*  PLT 122*   Recent Labs    06/30/19 0420 07/01/19 0415  NA 134* 137  K 4.5 4.5  CL 103 105  CO2 22 23  BUN 46* 48*  CREATININE 2.46* 2.06*  GLUCOSE 169* 178*  CALCIUM 8.2* 8.3*   No results for input(s): LABPT, INR in the last 72 hours.   EXAM General - Patient is Alert, Appropriate and Oriented Extremity - ABD soft Sensation intact distally Intact pulses distally Dorsiflexion/Plantar flexion intact Incision: dressing C/D/I No cellulitis present Dressing/Incision - clean, dry, no drainage Motor Function - intact, moving foot and toes well on exam.  Abdomen soft with normal bowel sounds this AM. Negative Homans to bilateral lower extremities.  Past Medical History:  Diagnosis Date  . AF (atrial fibrillation) ([place])   . Arthritis    hands  . Chronic kidney disease   .  Diabetes mellitus without complication ([place])   . Hyperlipidemia   . Hypertension   . Prostate enlargement     Assessment/Plan: 4 Days Post-Op Procedure(s) (LRB): ARTHROPLASTY BIPOLAR HIP (HEMIARTHROPLASTY) (Left) Principal Problem:   Closed displaced fracture of left femoral neck (HCC) Active Problems:   Type II diabetes mellitus with renal manifestations (HCC)   CKD (chronic kidney disease) stage 3, GFR 30-59 ml/min   Hyperlipidemia, mixed   Chronic systolic CHF (congestive heart failure), NYHA class 3 (HCC)   TIA (transient ischemic attack)   Essential hypertension   Atrial fibrillation, chronic ([place])   Depression   Fall at home, initial encounter  Estimated body mass index is 27.2 kg/m as calculated from the following:   Height as of this encounter: 5\' 11"  (1.803 m).   Weight as of this encounter: 88.5 kg. Advance diet Up with therapy   Continue with therapy. Attempt to use tylenol only instead of narcotics to see if this improves nausea, continue Zofran. Patient has had a BM. Possible discharge home with HHPT pending therapy.  Upon discharge, follow-up with [place] in two weeks for staple removal. Continue Eliquis 2.5mg  twice daily for 14 days upon discharge.  DVT Prophylaxis - Lovenox, Foot Pumps and TED hose Weight-Bearing as tolerated to left leg  J. Cameron Proud, PA-C [facility] Orthopaedic Surgery 07/02/2019, 7:53 AM

## 2019-07-02 NOTE — TOC Progression Note (Addendum)
Transition of Care [facility]) - Progression Note    Patient Details  Name: Cody Howard MRN: [id number] Date of Birth: 13-Jan-1935  Transition of Care [facility]) CM/SW [place], RN Phone Number: 07/02/2019, 2:29 PM  Clinical Narrative:     Received a call from Henrene Pastor The patients son he was wanting to know how the process works to find a bed and get insurance approval, I reviewed the process and let him know that in order to start the insurance process we ned the bed choice, He stated they do not want to go outside of [place] area and they do not want [facility], I called Ricky at [facility] to request them to review the patient for a bed offer. They are not in network with [facility], I called Magda Paganini at [facility] and requested them to look at the request  Expected Discharge Plan: [place]    Expected Discharge Plan and Services Expected Discharge Plan: [place] arrangements for the past 2 months: Single Family Home                                       Social Determinants of Health (SDOH) Interventions    Readmission Risk Interventions No flowsheet data found.

## 2019-07-02 NOTE — Progress Notes (Signed)
PROGRESS NOTE    Cody Howard  ZHY:[id number] DOB: 1934/12/16 DOA: 06/27/2019 PCP: [name], MD    Brief Narrative:  HPI: Cody Howard is a 85 y.o. male with medical history significant of hypertension, hyperlipidemia, diabetes mellitus, TIA, depression, atrial fibrillation not on anticoagulants, CKD stage IIIa, sCHF with EF of 45%, who presents with fall and left hip pain.  Pt states that fell on the side way of his home when he was walking outside to get the paper. He injured his left hip, causing severe pain in left hip. No loss of consciousness.  Strongly denies any head or neck injury.  Refused CT of head and neck.  Patient does not have chest pain, shortness breath, cough.  No nausea vomiting, diarrhea, abdominal pain, symptoms of UTI or unilateral weakness. Of note, pt had laceration to right palm on 4/10,which was sutured up and healing well.   4/22: Seen and examined in postanesthesia care unit. Hemodynamically stable. Little sleepy but easily awakened. Denies pain. Status post hemiarthroplasty with orthopedic surgery for a left hip displaced femoral neck fracture  4/23: Seen and examined.  Remains hemodynamically stable.  Postoperative day #1.  Sitting up in bed.  Answers all questions appropriately.  Per nursing staff patient has some difficulty being directable.  Apparently was attempting to remove clothing and exit the bed without requesting nursing assistance.  Patient educated.  4/24: Seen and examined.  Remains hemodynamically stable.  Postoperative day #2.  Lying in bed.  Answers all questions appropriately.  Still no BM.  Kidney function worsening over interval.  4/25: Seen and examined.  Remains HD stable.  POD #3.  Small BM yesterday.  Episode this AM of diaphoresis, nausea upon rising from lying position.  Now improved.  4/26: Seen and examined.  Hemodynamically stable.  Postoperative day #4.  Wife at bedside.  Large bowel movement reported.  Patient feels much  better.  No nausea reported.  Requesting skilled nursing facility placement.  Case management aware.  Looking for choices.   Assessment & Plan:   Principal Problem:   Closed displaced fracture of left femoral neck (HCC) Active Problems:   Type II diabetes mellitus with renal manifestations (HCC)   CKD (chronic kidney disease) stage 3, GFR 30-59 ml/min   Hyperlipidemia, mixed   Chronic systolic CHF (congestive heart failure), NYHA class 3 (HCC)   TIA (transient ischemic attack)   Essential hypertension   Atrial fibrillation, chronic ([place])   Depression   Fall at home, initial encounter  Closed displaced fracture of left femoral neck ([place])  As evidenced by x-ray.  POD#4 s/p hemiarthroplasty with Dr. Hyman Bower procedure well Ortho following Pain well controlled Plan: Multimodal pain control As needed nausea control PT OT today: Recommend skilled nursing facility placement Subcutaneous Lovenox.  VT prophylaxis Switch to Eliquis 2.5 BID on discharge.  Complete 14 day course  CKD (chronic kidney disease) stage 3a, GFR 30-59 ml/min Creatinine back at baseline No further IVF indicated  Type II diabetes mellitus with renal manifestations ([place])  Most recent A1c 6.6, well controled.  Patient is taking Metformin at home Home metformin on hold -SSI  Hyperlipidemia, mixed: -statin  Chronic systolic CHF (congestive heart failure), NYHA class 3 ([place])  2D echo on 03/05/2019 showed EF of 45%.   Patient does not have leg edema JVD.  No shortness of breath.  CHF is compensated.   BNP 27 -Continue home dose Lasix and Coreg  TIA (transient ischemic attack): -statin  HTN:  -  Continue home medications: Cozaar, Coreg -Patient is also on Lasix -hydralazine prn  Atrial fibrillation, chronic ([place]): Not on anticoagulants.  Heart rate 83 -Cardiac monitor -Continue Coreg  Depression -Continue Zoloft  Fall at home, initial encounter -PT/OT when able to   DVT  prophylaxis: SCDs Code Status: Full Family Communication: Wife at bedside Disposition Plan:  Status is: Inpatient  Remains inpatient appropriate because:Unsafe d/c plan   Dispo: The patient is from: Home              Anticipated d/c is to: SNF              Anticipated d/c date is: 1 day              Patient currently is medically stable to d/c.   Pending SNF placement.         Consultants:   Orthopedics  Procedures:   Left hip hemiarthroplasty-06/28/2019  Antimicrobials:   None   Subjective: Seen and examined Large BM reported Pain well controlled No complaints  Objective: Vitals:   07/02/19 0013 07/02/19 0256 07/02/19 0340 07/02/19 0838  BP: (!) 164/61 (!) 172/78 (!) 163/72 (!) 148/56  Pulse: 63 68 68 70  Resp: 17   17  Temp: 97.6 F (36.4 C)   99.2 F (37.3 C)  TempSrc: Oral   Oral  SpO2: 95%  95% 94%  Weight:      Height:        Intake/Output Summary (Last 24 hours) at 07/02/2019 1346 Last data filed at 07/02/2019 0954 Gross per 24 hour  Intake 720 ml  Output 800 ml  Net -80 ml   Filed Weights   06/27/19 0950  Weight: 88.5 kg    Examination:  General exam: Appears calm and comfortable  Respiratory system: Clear to auscultation. Respiratory effort normal. Cardiovascular system: S1 & S2 heard, RRR. No JVD, murmurs, rubs, gallops or clicks. No pedal edema. Gastrointestinal system: Abdomen is nondistended, soft and nontender. No organomegaly or masses felt. Normal bowel sounds heard. Central nervous system: Alert and oriented. No focal neurological deficits. Extremities: Lower extremities in immobilization and surgical dressing, not removed Skin: No rashes, lesions or ulcers Psychiatry: Judgement and insight appear normal. Mood & affect appropriate.     Data Reviewed: I have personally reviewed following labs and imaging studies  CBC: Recent Labs  Lab 06/27/19 1118 06/28/19 0559 06/29/19 0617 06/30/19 0420  WBC 9.4 10.6* 13.5*  11.8*  NEUTROABS 7.2  --   --   --   HGB 12.2* 11.3* 9.7* 9.4*  HCT 36.2* 33.6* 28.5* 26.7*  MCV 99.7 97.4 99.3 97.4  PLT 138* 132* 122* 518*   Basic Metabolic Panel: Recent Labs  Lab 06/27/19 1118 06/28/19 0559 06/29/19 0617 06/30/19 0420 07/01/19 0415  NA 139 137 137 134* 137  K 4.9 5.2* 4.9 4.5 4.5  CL 109 105 107 103 105  CO2 23 24 23 22 23   GLUCOSE 188* 219* 215* 169* 178*  BUN 30* 32* 37* 46* 48*  CREATININE 2.13* 2.04* 2.18* 2.46* 2.06*  CALCIUM 8.9 8.7* 8.3* 8.2* 8.3*   GFR: Estimated Creatinine Clearance: 29.4 mL/min (A) (by C-G formula based on SCr of 2.06 mg/dL (H)). Liver Function Tests: No results for input(s): AST, ALT, ALKPHOS, BILITOT, PROT, ALBUMIN in the last 168 hours. No results for input(s): LIPASE, AMYLASE in the last 168 hours. No results for input(s): AMMONIA in the last 168 hours. Coagulation Profile: Recent Labs  Lab 06/27/19 1118  INR 0.9  Cardiac Enzymes: No results for input(s): CKTOTAL, CKMB, CKMBINDEX, TROPONINI in the last 168 hours. BNP (last 3 results) No results for input(s): PROBNP in the last 8760 hours. HbA1C: No results for input(s): HGBA1C in the last 72 hours. CBG: Recent Labs  Lab 07/01/19 1312 07/01/19 1716 07/01/19 2115 07/02/19 0840 07/02/19 1227  GLUCAP 179* 219* 181* 251* 228*   Lipid Profile: No results for input(s): CHOL, HDL, LDLCALC, TRIG, CHOLHDL, LDLDIRECT in the last 72 hours. Thyroid Function Tests: No results for input(s): TSH, T4TOTAL, FREET4, T3FREE, THYROIDAB in the last 72 hours. Anemia Panel: No results for input(s): VITAMINB12, FOLATE, FERRITIN, TIBC, IRON, RETICCTPCT in the last 72 hours. Sepsis Labs: No results for input(s): PROCALCITON, LATICACIDVEN in the last 168 hours.  Recent Results (from the past 240 hour(s))  Respiratory Panel by RT PCR (Flu A&B, Covid) - Nasopharyngeal Swab     Status: None   Collection Time: 06/27/19 11:54 AM   Specimen: Nasopharyngeal Swab  Result Value Ref  Range Status   SARS Coronavirus 2 by RT PCR NEGATIVE NEGATIVE Final    Comment: (NOTE) SARS-CoV-2 target nucleic acids are NOT DETECTED. The SARS-CoV-2 RNA is generally detectable in upper respiratoy specimens during the acute phase of infection. The lowest concentration of SARS-CoV-2 viral copies this assay can detect is 131 copies/mL. A negative result does not preclude SARS-Cov-2 infection and should not be used as the sole basis for treatment or other patient management decisions. A negative result may occur with  improper specimen collection/handling, submission of specimen other than nasopharyngeal swab, presence of viral mutation(s) within the areas targeted by this assay, and inadequate number of viral copies (<131 copies/mL). A negative result must be combined with clinical observations, patient history, and epidemiological information. The expected result is Negative. Fact Sheet for Patients:  PinkCheek.be Fact Sheet for Healthcare Providers:  GravelBags.it This test is not yet ap proved or cleared by the [place] FDA and  has been authorized for detection and/or diagnosis of SARS-CoV-2 by FDA under an Emergency Use Authorization (EUA). This EUA will remain  in effect (meaning this test can be used) for the duration of the COVID-19 declaration under Section 564(b)(1) of the Act, 21 U.S.C. section 360bbb-3(b)(1), unless the authorization is terminated or revoked sooner.    Influenza A by PCR NEGATIVE NEGATIVE Final   Influenza B by PCR NEGATIVE NEGATIVE Final    Comment: (NOTE) The Xpert Xpress SARS-CoV-2/FLU/RSV assay is intended as an aid in  the diagnosis of influenza from Nasopharyngeal swab specimens and  should not be used as a sole basis for treatment. Nasal washings and  aspirates are unacceptable for Xpert Xpress SARS-CoV-2/FLU/RSV  testing. Fact Sheet for  Patients: PinkCheek.be Fact Sheet for Healthcare Providers: GravelBags.it This test is not yet approved or cleared by the [place] FDA and  has been authorized for detection and/or diagnosis of SARS-CoV-2 by  FDA under an Emergency Use Authorization (EUA). This EUA will remain  in effect (meaning this test can be used) for the duration of the  Covid-19 declaration under Section 564(b)(1) of the Act, 21  U.S.C. section 360bbb-3(b)(1), unless the authorization is  terminated or revoked. Performed at [facility], [address] [place], [place] [address]   Surgical pcr screen     Status: None   Collection Time: 06/27/19  5:41 PM   Specimen: Nasal Mucosa; Nasal Swab  Result Value Ref Range Status   MRSA, PCR NEGATIVE NEGATIVE Final   Staphylococcus aureus NEGATIVE NEGATIVE  Final    Comment: (NOTE) The Xpert SA Assay (FDA approved for NASAL specimens in patients 81 years of age and older), is one component of a comprehensive surveillance program. It is not intended to diagnose infection nor to guide or monitor treatment. Performed at [facility], [address], [place], [place] [address]          Radiology Studies: DG Abd 1 View  Result Date: 07/01/2019 CLINICAL DATA:  Constipation. EXAM: ABDOMEN - 1 VIEW COMPARISON:  None. FINDINGS: There is a small stool ball in the rectum. No bowel obstruction or ileus identified. The patient is status post left hip replacement. The femoral component is not completely imaged. IMPRESSION: No significant abnormalities are noted. Electronically Signed   By: Dorise Bullion III M.D   On: 07/01/2019 15:22        Scheduled Meds: . atorvastatin  40 mg Oral Daily  . carvedilol  3.125 mg Oral BID WC  . docusate sodium  100 mg Oral BID  . donepezil  10 mg Oral Daily  . enoxaparin (LOVENOX) injection  30 mg Subcutaneous Q24H  . insulin aspart  0-9 Units  Subcutaneous TID AC & HS  . melatonin  5 mg Oral QHS  . omega-3 acid ethyl esters  1 g Oral Daily  . Ensure Max Protein  11 oz Oral BID BM  . sertraline  25 mg Oral Daily  . sodium chloride flush  10 mL Intravenous Q8H  . tamsulosin  0.4 mg Oral Daily   Continuous Infusions: . sodium chloride    .  ceFAZolin (ANCEF) IV       LOS: 5 days    Time spent: 35 min    Sidney Ace, MD [facility] Pager [phone]  If 7PM-7AM, please contact night-coverage 07/02/2019, 1:46 PM

## 2019-07-02 NOTE — Progress Notes (Signed)
Physical Therapy Treatment Patient Details Name: Cody Howard MRN: [id number] DOB: 1934-11-09 Today's Date: 07/02/2019    History of Present Illness Pt admitted for L femoral neck fx secondary to falls s/p unipolar hemiarthoplasty POD 1 at time of evaluation. HIstory includes HTN, DM, TIA, depression, Afib, and CKD 3.    PT Comments    Pt reports feeling better today, ready for session.  Orthostatic vitals taken today, see flow sheets.  Generally stable but did have a 30 point drop in systolic.  He was unable to stand for the full 3 minutes due to fatigue so 3 minute vital was completed in sitting.  He endorses some dizziness but no profuse sweating like yesterday.  After seated ex and rest, he is able to walk 45' with RW and min guard.  Generally steady but knees and trunk remain flexed and overall weakness in appearance. Remained up in recliner after session.  Discussed discharge plan with pt and wife.  They both feel uncomfortable with discharge home today and feel SNF is a better option.  I agree as he has not progressed according to expectations.  He continues to require assist for bed mobility, limited gait not meeting gait benchmarks for discharge.  He has stairs to enter/exit home without rails.  Wife is encouraged to address this ASAP and voices understanding.  Discussed with SWS who will follow up with discharge planning.    Follow Up Recommendations  SNF     Equipment Recommendations  Rolling walker with 5" wheels;3in1 (PT)    Recommendations for Other Services       Precautions / Restrictions Precautions Precautions: Posterior Hip;Fall Restrictions Weight Bearing Restrictions: Yes LLE Weight Bearing: Weight bearing as tolerated    Mobility  Bed Mobility Overal bed mobility: Needs Assistance Bed Mobility: Supine to Sit     Supine to sit: Mod assist        Transfers Overall transfer level: Needs assistance Equipment used: Rolling walker (2  wheeled) Transfers: Sit to/from Stand Sit to Stand: Min assist            Ambulation/Gait Ambulation/Gait assistance: Min Web designer (Feet): 45 Feet Assistive device: Rolling walker (2 wheeled) Gait Pattern/deviations: Step-through pattern;Decreased step length - right;Decreased step length - left;Trunk flexed Gait velocity: decreased   General Gait Details: knees remain slightly flexed with standing/gait.  generallt steady but limited by fatigue and some dizziness.   Stairs             Wheelchair Mobility    Modified Rankin (Stroke Patients Only)       Balance Overall balance assessment: Needs assistance Sitting-balance support: Feet supported Sitting balance-Leahy Scale: Good     Standing balance support: Bilateral upper extremity supported Standing balance-Leahy Scale: Fair                              Cognition Arousal/Alertness: Awake/alert Behavior During Therapy: WFL for tasks assessed/performed Overall Cognitive Status: Within Functional Limits for tasks assessed                                        Exercises Other Exercises Other Exercises: seated AROM for LAQ, ankle pumps, ab/add x 10    General Comments        Pertinent Vitals/Pain Pain Assessment: Faces Faces Pain Scale: Hurts a little bit Pain Location:  L hip Pain Descriptors / Indicators: Operative site guarding Pain Intervention(s): Limited activity within patient's tolerance;Monitored during session    Home Living                      Prior Function            PT Goals (current goals can now be found in the care plan section) Progress towards PT goals: Progressing toward goals    Frequency    BID      PT Plan Discharge plan needs to be updated    Co-evaluation              AM-PAC PT "6 Clicks" Mobility   Outcome Measure  Help needed turning from your back to your side while in a flat bed without using  bedrails?: A Little Help needed moving from lying on your back to sitting on the side of a flat bed without using bedrails?: A Little Help needed moving to and from a bed to a chair (including a wheelchair)?: A Little Help needed standing up from a chair using your arms (e.g., wheelchair or bedside chair)?: A Little Help needed to walk in hospital room?: A Little Help needed climbing 3-5 steps with a railing? : A Little 6 Click Score: 18    End of Session Equipment Utilized During Treatment: Gait belt Activity Tolerance: Patient limited by fatigue Patient left: in chair;with call bell/phone within reach;with chair alarm set;with family/visitor present Nurse Communication: Mobility status;Other (comment) Pain - Right/Left: Left Pain - part of body: Hip     Time: 7902-4097 PT Time Calculation (min) (ACUTE ONLY): 27 min  Charges:  $Gait Training: 8-22 mins $Therapeutic Exercise: 8-22 mins                    Chesley Noon, PTA 07/02/19, 9:20 AM

## 2019-07-02 NOTE — Plan of Care (Signed)
  Problem: Activity: Goal: Ability to ambulate and perform ADLs will improve Outcome: Progressing   Problem: Clinical Measurements: Goal: Postoperative complications will be avoided or minimized Outcome: Progressing   Problem: Pain Management: Goal: Pain level will decrease Outcome: Progressing   

## 2019-07-02 NOTE — TOC Progression Note (Signed)
Transition of Care [facility]) - Progression Note    Patient Details  Name: Cody Howard MRN: [id number] Date of Birth: [birth year]  Transition of Care [facility]) CM/SW [place], RN Phone Number: 07/02/2019, 9:57 AM  Clinical Narrative:    The patient did not make the progress that they would need to go home they are agreeable to go to SNF, [facility], PASSR and bed search sent, will review bed offers once obtained   Expected Discharge Plan: [place]    Expected Discharge Plan and Services Expected Discharge Plan: [place] arrangements for the past 2 months: Single Family Home                                       Social Determinants of Health (SDOH) Interventions    Readmission Risk Interventions No flowsheet data found.

## 2019-07-02 NOTE — Care Management Important Message (Signed)
Important Message  Patient Details  Name: Cody Howard MRN: [id number] Date of Birth: 11/20/1934   Medicare Important Message Given:  Yes     Juliann Pulse A Harvis Mabus 07/02/2019, 11:42 AM

## 2019-07-02 NOTE — NC FL2 (Signed)
[place] LEVEL OF CARE SCREENING TOOL     IDENTIFICATION  Patient Name: Cody Howard Birthdate: [birth year] Sex: male Admission Date (Current Location): 06/27/2019  [place] and [place] Number:  Engineering geologist and Address:  [facility], [address] [place], [place] [address]      Provider Number: [id number]  Attending Physician Name and Address:  Sidney Ace, MD  Relative Name and Phone Number:  Karlis Cregg [phone]    Current Level of Care: Hospital Recommended Level of Care: [place] Prior Approval Number:    Date Approved/Denied:   PASRR Number: [id number]  Discharge Plan: SNF    Current Diagnoses: Patient Active Problem List   Diagnosis Date Noted  . Atrial fibrillation, chronic ([place]) 06/27/2019  . Depression 06/27/2019  . Fall at home, initial encounter 06/27/2019  . Closed displaced fracture of left femoral neck ([place]) 06/27/2019  . History of anemia 05/31/2019  . Benign prostatic hyperplasia with lower urinary tract symptoms 05/31/2019  . Cerebral atrophy, mild ([place]) 05/31/2019  . Reactive depression 05/31/2019  . Essential hypertension 05/31/2019  . Hiatal hernia 05/31/2019  . Dizziness 04/12/2018  . TIA (transient ischemic attack) 01/25/2018  . Chronic systolic CHF (congestive heart failure), NYHA class 3 ([place]) 11/29/2017  . CKD (chronic kidney disease) stage 3, GFR 30-59 ml/min 08/18/2017  . Bradycardia 08/18/2017  . Atrial flutter, paroxysmal ([place]) 07/21/2017  . Functional dyspnea 07/06/2017  . Cardiomyopathy ([place]) 07/06/2017  . Hyperlipidemia, mixed 06/20/2017  . Hyperlipidemia associated with type 2 diabetes mellitus ([place]) 04/06/2016  . Type II diabetes mellitus with renal manifestations ([place]) 02/10/2015  . Personal history of other diseases of male genital organs 02/10/2015  . Sebaceous cyst 08/28/2014    Orientation RESPIRATION BLADDER Height & Weight     Self,  Time, Situation, Place  Normal Continent Weight: 88.5 kg Height:  5\' 11"  (180.3 cm)  BEHAVIORAL SYMPTOMS/MOOD NEUROLOGICAL BOWEL NUTRITION STATUS      Continent Diet(Heart Healthy Carb Modified)  AMBULATORY STATUS COMMUNICATION OF NEEDS Skin   Extensive Assist Verbally Surgical wounds                       Personal Care Assistance Level of Assistance  Dressing, Bathing Bathing Assistance: Limited assistance   Dressing Assistance: Limited assistance     Functional Limitations Info             SPECIAL CARE FACTORS FREQUENCY  PT (By licensed PT)     PT Frequency: 5 times per week              Contractures Contractures Info: Not present    Additional Factors Info  Allergies, Code Status Code Status Info: full code Allergies Info: no known Drug Allergies           Current Medications (07/02/2019):  This is the current hospital active medication list Current Facility-Administered Medications  Medication Dose Route Frequency Provider Last Rate Last Admin  . 0.9 %  sodium chloride infusion   Intravenous PRN Poggi, Marshall Cork, MD      . acetaminophen (TYLENOL) tablet 325-650 mg  325-650 mg Oral Q6H PRN Poggi, Marshall Cork, MD      . acetaminophen (TYLENOL) tablet 650 mg  650 mg Oral Q6H PRN Poggi, Marshall Cork, MD      . atorvastatin (LIPITOR) tablet 40 mg  40 mg Oral Daily Poggi, Marshall Cork, MD   40 mg at 07/01/19 2143  . bisacodyl (  DULCOLAX) suppository 10 mg  10 mg Rectal Daily PRN Poggi, Marshall Cork, MD      . carvedilol (COREG) tablet 3.125 mg  3.125 mg Oral BID WC Poggi, Marshall Cork, MD   3.125 mg at 07/02/19 0940  . ceFAZolin (ANCEF) IVPB 1 g/50 mL premix  1 g Intravenous Once Poggi, Marshall Cork, MD      . diphenhydrAMINE (BENADRYL) 12.5 MG/5ML elixir 12.5-25 mg  12.5-25 mg Oral Q4H PRN Poggi, Marshall Cork, MD      . diphenhydrAMINE (BENADRYL) capsule 50 mg  50 mg Oral QHS PRN Poggi, Marshall Cork, MD      . docusate sodium (COLACE) capsule 100 mg  100 mg Oral BID Poggi, Marshall Cork, MD   100 mg at 07/02/19  0940  . donepezil (ARICEPT) tablet 10 mg  10 mg Oral Daily Poggi, Marshall Cork, MD   10 mg at 07/02/19 0940  . enoxaparin (LOVENOX) injection 30 mg  30 mg Subcutaneous Q24H Dallie Piles, RPH   30 mg at 07/02/19 0941  . hydrALAZINE (APRESOLINE) injection 5 mg  5 mg Intravenous Q2H PRN Poggi, Marshall Cork, MD   5 mg at 07/02/19 0302  . insulin aspart (novoLOG) injection 0-9 Units  0-9 Units Subcutaneous TID AC & HS Sharion Settler, NP   5 Units at 07/02/19 0949  . melatonin tablet 5 mg  5 mg Oral QHS Poggi, Marshall Cork, MD   5 mg at 07/01/19 2143  . methocarbamol (ROBAXIN) tablet 500 mg  500 mg Oral Q8H PRN Poggi, Marshall Cork, MD   500 mg at 07/02/19 0301  . metoCLOPramide (REGLAN) tablet 5-10 mg  5-10 mg Oral Q8H PRN Poggi, Marshall Cork, MD       Or  . metoCLOPramide (REGLAN) injection 5-10 mg  5-10 mg Intravenous Q8H PRN Poggi, Marshall Cork, MD   10 mg at 07/02/19 0411  . morphine 2 MG/ML injection 0.5 mg  0.5 mg Intravenous Q4H PRN Poggi, Marshall Cork, MD   0.5 mg at 06/27/19 1536  . omega-3 acid ethyl esters (LOVAZA) capsule 1 g  1 g Oral Daily Poggi, Marshall Cork, MD   1 g at 07/02/19 0939  . ondansetron (ZOFRAN) injection 4 mg  4 mg Intravenous Q8H PRN Poggi, Marshall Cork, MD      . ondansetron (ZOFRAN) tablet 4 mg  4 mg Oral Q6H PRN Poggi, Marshall Cork, MD       Or  . ondansetron [facility]) injection 4 mg  4 mg Intravenous Q6H PRN Poggi, Marshall Cork, MD   4 mg at 07/02/19 0029  . oxyCODONE (Oxy IR/ROXICODONE) immediate release tablet 5 mg  5 mg Oral Q4H PRN Poggi, Marshall Cork, MD   5 mg at 06/29/19 1034  . protein supplement (ENSURE MAX) liquid  11 oz Oral BID BM Poggi, Marshall Cork, MD   11 oz at 07/02/19 0941  . senna-docusate (Senokot-S) tablet 1 tablet  1 tablet Oral QHS PRN Poggi, Marshall Cork, MD      . sertraline (ZOLOFT) tablet 25 mg  25 mg Oral Daily Poggi, Marshall Cork, MD   25 mg at 07/02/19 0940  . sodium chloride flush (NS) 0.9 % injection 10 mL  10 mL Intravenous Q8H Sreenath, Sudheer B, MD   10 mL at 07/02/19 0542  . tamsulosin (FLOMAX) capsule 0.4 mg  0.4  mg Oral Daily Poggi, Marshall Cork, MD   0.4 mg at 07/02/19 0940  . traMADol (ULTRAM) tablet 50 mg  50 mg Oral  Q6H PRN Poggi, Marshall Cork, MD         Discharge Medications: Please see discharge summary for a list of discharge medications.  Relevant Imaging Results:  Relevant Lab Results:   Additional Information SS# [id number]  Su Hilt, RN

## 2019-07-02 NOTE — Progress Notes (Signed)
Physical Therapy Treatment Patient Details Name: Cody Howard MRN: [id number] DOB: 10/19/1934 Today's Date: 07/02/2019    History of Present Illness Pt admitted for L femoral neck fx secondary to falls s/p unipolar hemiarthoplasty POD 1 at time of evaluation. HIstory includes HTN, DM, TIA, depression, Afib, and CKD 3.    PT Comments    Pt fatigued in chair this am and returned to bed with nursing staff prior to lunch.  Returned in PM and pt agreed to gait.  Bed mobility with mod a x 1.  Stood with min a x 1 and is able to walk 35' with RW and min guard.  Limited by fatigue but no LOB or buckling.  Pt encouraged to move up in bed before sitting but he was too fatigued.  Encouraged him to stand again after resting but he lateral scooted in sitting stating he was too tired.   SNF remains appropriate given limited mobility and not meeting benchmarks for discharge home.   Follow Up Recommendations  SNF     Equipment Recommendations  Rolling walker with 5" wheels;3in1 (PT)    Recommendations for Other Services       Precautions / Restrictions Precautions Precautions: Posterior Hip;Fall Restrictions Weight Bearing Restrictions: Yes LLE Weight Bearing: Weight bearing as tolerated    Mobility  Bed Mobility Overal bed mobility: Needs Assistance Bed Mobility: Supine to Sit;Sit to Supine     Supine to sit: Mod assist Sit to supine: Mod assist      Transfers Overall transfer level: Needs assistance Equipment used: Rolling walker (2 wheeled) Transfers: Sit to/from Stand Sit to Stand: Min assist            Ambulation/Gait Ambulation/Gait assistance: Min assist Gait Distance (Feet): 35 Feet Assistive device: Rolling walker (2 wheeled) Gait Pattern/deviations: Step-through pattern;Decreased step length - right;Decreased step length - left;Trunk flexed Gait velocity: decreased   General Gait Details: knees remain slightly flexed with standing/gait.  generallt steady but  limited by fatigue and some dizziness.   Stairs             Wheelchair Mobility    Modified Rankin (Stroke Patients Only)       Balance Overall balance assessment: Needs assistance Sitting-balance support: Feet supported Sitting balance-Leahy Scale: Good     Standing balance support: Bilateral upper extremity supported Standing balance-Leahy Scale: Fair                              Cognition Arousal/Alertness: Awake/alert Behavior During Therapy: WFL for tasks assessed/performed Overall Cognitive Status: Within Functional Limits for tasks assessed                                        Exercises      General Comments        Pertinent Vitals/Pain Pain Assessment: Faces Faces Pain Scale: Hurts a little bit Pain Location: L hip Pain Descriptors / Indicators: Operative site guarding Pain Intervention(s): Limited activity within patient's tolerance;Monitored during session;Repositioned    Home Living                      Prior Function            PT Goals (current goals can now be found in the care plan section) Progress towards PT goals: Progressing toward goals  Frequency    BID      PT Plan Current plan remains appropriate    Co-evaluation              AM-PAC PT "6 Clicks" Mobility   Outcome Measure  Help needed turning from your back to your side while in a flat bed without using bedrails?: A Little Help needed moving from lying on your back to sitting on the side of a flat bed without using bedrails?: A Little Help needed moving to and from a bed to a chair (including a wheelchair)?: A Little Help needed standing up from a chair using your arms (e.g., wheelchair or bedside chair)?: A Little Help needed to walk in hospital room?: A Little Help needed climbing 3-5 steps with a railing? : A Little 6 Click Score: 18    End of Session Equipment Utilized During Treatment: Gait belt Activity  Tolerance: Patient limited by fatigue Patient left: in bed;with call bell/phone within reach;with bed alarm set;with family/visitor present Nurse Communication: Mobility status Pain - Right/Left: Left Pain - part of body: Hip     Time: 7471-8550 PT Time Calculation (min) (ACUTE ONLY): 13 min  Charges:  $Gait Training: 8-22 mins                    Chesley Noon, PTA 07/02/19, 2:10 PM

## 2019-07-03 LAB — CBC WITH DIFFERENTIAL/PLATELET
Abs Immature Granulocytes: 0.08 10*3/uL — ABNORMAL HIGH (ref 0.00–0.07)
Basophils Absolute: 0 10*3/uL (ref 0.0–0.1)
Basophils Relative: 0 %
Eosinophils Absolute: 0 10*3/uL (ref 0.0–0.5)
Eosinophils Relative: 0 %
HCT: 31.8 % — ABNORMAL LOW (ref 39.0–52.0)
Hemoglobin: 11.3 g/dL — ABNORMAL LOW (ref 13.0–17.0)
Immature Granulocytes: 1 %
Lymphocytes Relative: 11 %
Lymphs Abs: 1.3 10*3/uL (ref 0.7–4.0)
MCH: 33.7 pg (ref 26.0–34.0)
MCHC: 35.5 g/dL (ref 30.0–36.0)
MCV: 94.9 fL (ref 80.0–100.0)
Monocytes Absolute: 0.8 10*3/uL (ref 0.1–1.0)
Monocytes Relative: 7 %
Neutro Abs: 9.7 10*3/uL — ABNORMAL HIGH (ref 1.7–7.7)
Neutrophils Relative %: 81 %
Platelets: 175 10*3/uL (ref 150–400)
RBC: 3.35 MIL/uL — ABNORMAL LOW (ref 4.22–5.81)
RDW: 12.6 % (ref 11.5–15.5)
WBC: 11.9 10*3/uL — ABNORMAL HIGH (ref 4.0–10.5)
nRBC: 0 % (ref 0.0–0.2)

## 2019-07-03 LAB — GLUCOSE, CAPILLARY
Glucose-Capillary: 189 mg/dL — ABNORMAL HIGH (ref 70–99)
Glucose-Capillary: 205 mg/dL — ABNORMAL HIGH (ref 70–99)
Glucose-Capillary: 200 mg/dL — ABNORMAL HIGH (ref 70–99)
Glucose-Capillary: 166 mg/dL — ABNORMAL HIGH (ref 70–99)

## 2019-07-03 LAB — BASIC METABOLIC PANEL
Anion gap: 10 (ref 5–15)
BUN: 35 mg/dL — ABNORMAL HIGH (ref 8–23)
CO2: 23 mmol/L (ref 22–32)
Calcium: 8.6 mg/dL — ABNORMAL LOW (ref 8.9–10.3)
Chloride: 104 mmol/L (ref 98–111)
Creatinine, Ser: 1.55 mg/dL — ABNORMAL HIGH (ref 0.61–1.24)
GFR calc Af Amer: 48 mL/min — ABNORMAL LOW (ref >60)
GFR calc non Af Amer: 41 mL/min — ABNORMAL LOW (ref >60)
Glucose, Bld: 200 mg/dL — ABNORMAL HIGH (ref 70–99)
Potassium: 4.2 mmol/L (ref 3.5–5.1)
Sodium: 137 mmol/L (ref 135–145)

## 2019-07-03 MED ORDER — ENOXAPARIN SODIUM 40 MG/0.4ML ~~LOC~~ SOLN
40.0000 mg | SUBCUTANEOUS | Status: DC
  Administered 2019-07-04 – 2019-07-05 (×2): 40 mg via SUBCUTANEOUS
  Filled 2019-07-03 (×2): qty 0.4

## 2019-07-03 NOTE — TOC Progression Note (Signed)
Transition of Care [facility]) - Progression Note    Patient Details  Name: Cody Howard MRN: [id number] Date of Birth: Aug 12, 1934  Transition [facility]) CM/SW [place], RN Phone Number: 07/03/2019, 9:24 AM  Clinical Narrative:    Damaris Schooner with Norberto Sorenson the patient's son, they have agreed to go to [facility], I notified Tammy at [facility] and asked that she start insurance   Expected Discharge Plan: [place]    Expected Discharge Plan and Services Expected Discharge Plan: [place] arrangements for the past 2 months: Single Family Home                                       Social Determinants of Health (SDOH) Interventions    Readmission Risk Interventions No flowsheet data found.

## 2019-07-03 NOTE — Progress Notes (Signed)
PROGRESS NOTE    NAYIB REMER  MGQ:[id number] DOB: 02-14-1935 DOA: 06/27/2019 PCP: [name], MD    Brief Narrative:  HPI: DRAKO MAESE is a 85 y.o. male with medical history significant of hypertension, hyperlipidemia, diabetes mellitus, TIA, depression, atrial fibrillation not on anticoagulants, CKD stage IIIa, sCHF with EF of 45%, who presents with fall and left hip pain.  Pt states that fell on the side way of his home when he was walking outside to get the paper. He injured his left hip, causing severe pain in left hip. No loss of consciousness.  Strongly denies any head or neck injury.  Refused CT of head and neck.  Patient does not have chest pain, shortness breath, cough.  No nausea vomiting, diarrhea, abdominal pain, symptoms of UTI or unilateral weakness. Of note, pt had laceration to right palm on 4/10,which was sutured up and healing well.   4/22: Seen and examined in postanesthesia care unit. Hemodynamically stable. Little sleepy but easily awakened. Denies pain. Status post hemiarthroplasty with orthopedic surgery for a left hip displaced femoral neck fracture  4/23: Seen and examined.  Remains hemodynamically stable.  Postoperative day #1.  Sitting up in bed.  Answers all questions appropriately.  Per nursing staff patient has some difficulty being directable.  Apparently was attempting to remove clothing and exit the bed without requesting nursing assistance.  Patient educated.  4/24: Seen and examined.  Remains hemodynamically stable.  Postoperative day #2.  Lying in bed.  Answers all questions appropriately.  Still no BM.  Kidney function worsening over interval.  4/25: Seen and examined.  Remains HD stable.  POD #3.  Small BM yesterday.  Episode this AM of diaphoresis, nausea upon rising from lying position.  Now improved.  4/26: Seen and examined.  Hemodynamically stable.  Postoperative day #4.  Wife at bedside.  Large bowel movement reported.  Patient feels much  better.  No nausea reported.  Requesting skilled nursing facility placement.  Case management aware.  Looking for choices.  4/27: Seen and examined.  Remains hemodynamically stable.  Postoperative day #5.  Case management looking for SNF bed.  Patient feels well.  No nausea reported.  Tolerating p.o. intake.  Pain well controlled.   Assessment & Plan:   Principal Problem:   Closed displaced fracture of left femoral neck (HCC) Active Problems:   Type II diabetes mellitus with renal manifestations (HCC)   CKD (chronic kidney disease) stage 3, GFR 30-59 ml/min   Hyperlipidemia, mixed   Chronic systolic CHF (congestive heart failure), NYHA class 3 (HCC)   TIA (transient ischemic attack)   Essential hypertension   Atrial fibrillation, chronic ([place])   Depression   Fall at home, initial encounter  Closed displaced fracture of left femoral neck ([place])  As evidenced by x-ray.  POD#5 s/p hemiarthroplasty with Dr. Hyman Bower procedure well Ortho following Pain well controlled Plan: Multimodal pain control As needed nausea control PT OT today: Recommend skilled nursing facility placement Subcutaneous Lovenox.  VTE prophylaxis Switch to Eliquis 2.5 BID on discharge.  Complete 14 day course.  Prescription written by orthopedic surgery PA and left in chart.  CKD (chronic kidney disease) stage 3a, GFR 30-59 ml/min Creatinine back at baseline No further IVF indicated  Type II diabetes mellitus with renal manifestations ([place])  Most recent A1c 6.6, well controled.  Patient is taking Metformin at home Home metformin on hold -SSI  Hyperlipidemia, mixed: -statin  Chronic systolic CHF (congestive heart failure), NYHA class  3 ([place])  2D echo on 03/05/2019 showed EF of 45%.   Patient does not have leg edema JVD.  No shortness of breath.  CHF is compensated.   BNP 27 -Continue home dose Lasix and Coreg  TIA (transient ischemic attack): -statin  HTN:  -Continue home medications:  Cozaar, Coreg -Patient is also on Lasix -hydralazine prn  Atrial fibrillation, chronic ([place]): Not on anticoagulants.  Heart rate 83 -Cardiac monitor -Continue Coreg  Depression -Continue Zoloft  Fall at home, initial encounter -PT/OT when able to   DVT prophylaxis: Lovenox, switch to Eliquis on discharge Code Status: Full Family Communication: Wife at bedside 4/26 Disposition Plan:  Status is: Inpatient  Remains inpatient appropriate because:Unsafe d/c plan   Dispo: The patient is from: Home              Anticipated d/c is to: SNF              Anticipated d/c date is: 1 day              Patient currently is medically stable to d/c.   Pending SNF placement.         Consultants:   Orthopedics  Procedures:   Left hip hemiarthroplasty-06/28/2019  Antimicrobials:   None   Subjective: Seen and examined Large BM reported Pain well controlled No complaints  Objective: Vitals:   07/03/19 0745 07/03/19 0751 07/03/19 0820 07/03/19 0918  BP: (!) 183/71 (!) 183/79 (!) 176/69 (!) 144/71  Pulse: 68 73 68 70  Resp:      Temp: 98.5 F (36.9 C)     TempSrc: Oral     SpO2: 95%     Weight:      Height:        Intake/Output Summary (Last 24 hours) at 07/03/2019 1423 Last data filed at 07/03/2019 0945 Gross per 24 hour  Intake 240 ml  Output 300 ml  Net -60 ml   Filed Weights   06/27/19 0950  Weight: 88.5 kg    Examination:  General exam: Appears calm and comfortable  Respiratory system: Clear to auscultation. Respiratory effort normal. Cardiovascular system: S1 & S2 heard, RRR. No JVD, murmurs, rubs, gallops or clicks. No pedal edema. Gastrointestinal system: Abdomen is nondistended, soft and nontender. No organomegaly or masses felt. Normal bowel sounds heard. Central nervous system: Alert and oriented. No focal neurological deficits. Extremities: Lower extremities in immobilization and surgical dressing, not removed Skin: No rashes, lesions or  ulcers Psychiatry: Judgement and insight appear normal. Mood & affect appropriate.     Data Reviewed: I have personally reviewed following labs and imaging studies  CBC: Recent Labs  Lab 06/27/19 1118 06/28/19 0559 06/29/19 0617 06/30/19 0420 07/03/19 0756  WBC 9.4 10.6* 13.5* 11.8* 11.9*  NEUTROABS 7.2  --   --   --  9.7*  HGB 12.2* 11.3* 9.7* 9.4* 11.3*  HCT 36.2* 33.6* 28.5* 26.7* 31.8*  MCV 99.7 97.4 99.3 97.4 94.9  PLT 138* 132* 122* 122* 151   Basic Metabolic Panel: Recent Labs  Lab 06/28/19 0559 06/29/19 0617 06/30/19 0420 07/01/19 0415 07/03/19 0756  NA 137 137 134* 137 137  K 5.2* 4.9 4.5 4.5 4.2  CL 105 107 103 105 104  CO2 24 23 22 23 23   GLUCOSE 219* 215* 169* 178* 200*  BUN 32* 37* 46* 48* 35*  CREATININE 2.04* 2.18* 2.46* 2.06* 1.55*  CALCIUM 8.7* 8.3* 8.2* 8.3* 8.6*   GFR: Estimated Creatinine Clearance: 39.1 mL/min (A) (by C-G  formula based on SCr of 1.55 mg/dL (H)). Liver Function Tests: No results for input(s): AST, ALT, ALKPHOS, BILITOT, PROT, ALBUMIN in the last 168 hours. No results for input(s): LIPASE, AMYLASE in the last 168 hours. No results for input(s): AMMONIA in the last 168 hours. Coagulation Profile: Recent Labs  Lab 06/27/19 1118  INR 0.9   Cardiac Enzymes: No results for input(s): CKTOTAL, CKMB, CKMBINDEX, TROPONINI in the last 168 hours. BNP (last 3 results) No results for input(s): PROBNP in the last 8760 hours. HbA1C: No results for input(s): HGBA1C in the last 72 hours. CBG: Recent Labs  Lab 07/02/19 1227 07/02/19 1647 07/02/19 2148 07/03/19 0805 07/03/19 1208  GLUCAP 228* 196* 174* 189* 205*   Lipid Profile: No results for input(s): CHOL, HDL, LDLCALC, TRIG, CHOLHDL, LDLDIRECT in the last 72 hours. Thyroid Function Tests: No results for input(s): TSH, T4TOTAL, FREET4, T3FREE, THYROIDAB in the last 72 hours. Anemia Panel: No results for input(s): VITAMINB12, FOLATE, FERRITIN, TIBC, IRON, RETICCTPCT in the  last 72 hours. Sepsis Labs: No results for input(s): PROCALCITON, LATICACIDVEN in the last 168 hours.  Recent Results (from the past 240 hour(s))  Respiratory Panel by RT PCR (Flu A&B, Covid) - Nasopharyngeal Swab     Status: None   Collection Time: 06/27/19 11:54 AM   Specimen: Nasopharyngeal Swab  Result Value Ref Range Status   SARS Coronavirus 2 by RT PCR NEGATIVE NEGATIVE Final    Comment: (NOTE) SARS-CoV-2 target nucleic acids are NOT DETECTED. The SARS-CoV-2 RNA is generally detectable in upper respiratoy specimens during the acute phase of infection. The lowest concentration of SARS-CoV-2 viral copies this assay can detect is 131 copies/mL. A negative result does not preclude SARS-Cov-2 infection and should not be used as the sole basis for treatment or other patient management decisions. A negative result may occur with  improper specimen collection/handling, submission of specimen other than nasopharyngeal swab, presence of viral mutation(s) within the areas targeted by this assay, and inadequate number of viral copies (<131 copies/mL). A negative result must be combined with clinical observations, patient history, and epidemiological information. The expected result is Negative. Fact Sheet for Patients:  PinkCheek.be Fact Sheet for Healthcare Providers:  GravelBags.it This test is not yet ap proved or cleared by the [place] FDA and  has been authorized for detection and/or diagnosis of SARS-CoV-2 by FDA under an Emergency Use Authorization (EUA). This EUA will remain  in effect (meaning this test can be used) for the duration of the COVID-19 declaration under Section 564(b)(1) of the Act, 21 U.S.C. section 360bbb-3(b)(1), unless the authorization is terminated or revoked sooner.    Influenza A by PCR NEGATIVE NEGATIVE Final   Influenza B by PCR NEGATIVE NEGATIVE Final    Comment: (NOTE) The Xpert Xpress  SARS-CoV-2/FLU/RSV assay is intended as an aid in  the diagnosis of influenza from Nasopharyngeal swab specimens and  should not be used as a sole basis for treatment. Nasal washings and  aspirates are unacceptable for Xpert Xpress SARS-CoV-2/FLU/RSV  testing. Fact Sheet for Patients: PinkCheek.be Fact Sheet for Healthcare Providers: GravelBags.it This test is not yet approved or cleared by the [place] FDA and  has been authorized for detection and/or diagnosis of SARS-CoV-2 by  FDA under an Emergency Use Authorization (EUA). This EUA will remain  in effect (meaning this test can be used) for the duration of the  Covid-19 declaration under Section 564(b)(1) of the Act, 21  U.S.C. section 360bbb-3(b)(1), unless the authorization is  terminated or revoked. Performed at [facility], [address], [place], [place] [address]   Surgical pcr screen     Status: None   Collection Time: 06/27/19  5:41 PM   Specimen: Nasal Mucosa; Nasal Swab  Result Value Ref Range Status   MRSA, PCR NEGATIVE NEGATIVE Final   Staphylococcus aureus NEGATIVE NEGATIVE Final    Comment: (NOTE) The Xpert SA Assay (FDA approved for NASAL specimens in patients 56 years of age and older), is one component of a comprehensive surveillance program. It is not intended to diagnose infection nor to guide or monitor treatment. Performed at [facility], [address] [place], [place] [address]          Radiology Studies: No results found.      Scheduled Meds: . atorvastatin  40 mg Oral Daily  . carvedilol  3.125 mg Oral BID WC  . docusate sodium  100 mg Oral BID  . donepezil  10 mg Oral Daily  . [START ON 07/04/2019] enoxaparin (LOVENOX) injection  40 mg Subcutaneous Q24H  . insulin aspart  0-9 Units Subcutaneous TID AC & HS  . melatonin  5 mg Oral QHS  . omega-3 acid ethyl esters  1 g Oral Daily  . Ensure Max Protein   11 oz Oral BID BM  . sertraline  25 mg Oral Daily  . sodium chloride flush  10 mL Intravenous Q8H  . tamsulosin  0.4 mg Oral Daily   Continuous Infusions: . sodium chloride    .  ceFAZolin (ANCEF) IV       LOS: 6 days    Time spent: 35 min    Sidney Ace, MD [facility] Pager [phone]  If 7PM-7AM, please contact night-coverage 07/03/2019, 2:23 PM

## 2019-07-03 NOTE — Progress Notes (Signed)
Subjective: 5 Days Post-Op Procedure(s) (LRB): ARTHROPLASTY BIPOLAR HIP (HEMIARTHROPLASTY) (Left) Patient reports pain as mild.   Patient is well, and has had no acute complaints or problems  Current plan is for discharge to SNF. Negative for chest pain and shortness of breath Fever: no Gastrointestinal: N/V over the past few days but has improved today.  Objective: Vital signs in last 24 hours: Temp:  [98.3 F (36.8 C)-98.5 F (36.9 C)] 98.5 F (36.9 C) (04/27 0745) Pulse Rate:  [65-73] 70 (04/27 0918) Resp:  [17-19] 19 (04/27 0006) BP: (144-183)/(63-79) 144/71 (04/27 0918) SpO2:  [94 %-95 %] 95 % (04/27 0745)  Intake/Output from previous day:  Intake/Output Summary (Last 24 hours) at 07/03/2019 1338 Last data filed at 07/03/2019 0945 Gross per 24 hour  Intake 240 ml  Output 300 ml  Net -60 ml    Intake/Output this shift: Total I/O In: 240 [P.O.:240] Out: -   Labs: Recent Labs    07/03/19 0756  HGB 11.3*   Recent Labs    07/03/19 0756  WBC 11.9*  RBC 3.35*  HCT 31.8*  PLT 175   Recent Labs    07/01/19 0415 07/03/19 0756  NA 137 137  K 4.5 4.2  CL 105 104  CO2 23 23  BUN 48* 35*  CREATININE 2.06* 1.55*  GLUCOSE 178* 200*  CALCIUM 8.3* 8.6*   No results for input(s): LABPT, INR in the last 72 hours.   EXAM General - Patient is Alert, Appropriate and Oriented Extremity - ABD soft Sensation intact distally Intact pulses distally Dorsiflexion/Plantar flexion intact Incision: dressing C/D/I No cellulitis present Dressing/Incision - clean, dry, no drainage Motor Function - intact, moving foot and toes well on exam.  Abdomen soft with normal bowel sounds this AM. Negative Homans to bilateral lower extremities.  Past Medical History:  Diagnosis Date  . AF (atrial fibrillation) ([place])   . Arthritis    hands  . Chronic kidney disease   . Diabetes mellitus without complication ([place])   . Hyperlipidemia   . Hypertension   . Prostate enlargement      Assessment/Plan: 5 Days Post-Op Procedure(s) (LRB): ARTHROPLASTY BIPOLAR HIP (HEMIARTHROPLASTY) (Left) Principal Problem:   Closed displaced fracture of left femoral neck (HCC) Active Problems:   Type II diabetes mellitus with renal manifestations (HCC)   CKD (chronic kidney disease) stage 3, GFR 30-59 ml/min   Hyperlipidemia, mixed   Chronic systolic CHF (congestive heart failure), NYHA class 3 (HCC)   TIA (transient ischemic attack)   Essential hypertension   Atrial fibrillation, chronic ([place])   Depression   Fall at home, initial encounter  Estimated body mass index is 27.2 kg/m as calculated from the following:   Height as of this encounter: 5\' 11"  (1.803 m).   Weight as of this encounter: 88.5 kg. Advance diet Up with therapy   Continue with therapy. Continue to avoid narcotics if possible. Patient has had a BM. Plan for discharge to SNF when appropriate.  Upon discharge, follow-up with [facility] in two weeks for staple removal. Continue Eliquis 2.5mg  twice daily for 14 days upon discharge.  DVT Prophylaxis - Lovenox, Foot Pumps and TED hose Weight-Bearing as tolerated to left leg  J. Cameron Proud, PA-C [facility] Orthopaedic Surgery 07/03/2019, 1:38 PM

## 2019-07-03 NOTE — Progress Notes (Signed)
Physical Therapy Treatment Patient Details Name: Cody Howard MRN: [id number] DOB: Jan 18, 1935 Today's Date: 07/03/2019    History of Present Illness Pt admitted for L femoral neck fx secondary to falls s/p unipolar hemiarthoplasty POD 1 at time of evaluation. HIstory includes HTN, DM, TIA, depression, Afib, and CKD 3.    PT Comments    Pt was in semi-fowlers position upon arriving. He agrees to PT session and is cooperative throughout. Reports no pain at rest however 6/10 pain in wt bearing.   Therapist reviewed precautions with pt. He required reminders throughout session to maintain. Pt exited R side of bed with increased time and Min assist. He stood with min assist and was able to ambulate 100 ft with RW without LOB.Fatigues quickly and requires vcs throughout for posture correction and improved gait sequencing. Pt will need reminders of hip precautions in future sessions. He was issued handout with exercises and able to perform with min assistance. Overall pt tolerated session well but will benefit from continues skilled PT at SNF to address strength, balance, and safe functional mobility deficits. Acute PT will continue to follow pt per POC.     Follow Up Recommendations  SNF     Equipment Recommendations  Rolling walker with 5" wheels;3in1 (PT)    Recommendations for Other Services       Precautions / Restrictions Precautions Precautions: Posterior Hip;Fall Precaution Booklet Issued: Yes (comment)(therapist issued HEP handout and pt performed) Restrictions Weight Bearing Restrictions: No LLE Weight Bearing: Weight bearing as tolerated    Mobility  Bed Mobility Overal bed mobility: Needs Assistance Bed Mobility: Supine to Sit;Sit to Supine     Supine to sit: Min assist Sit to supine: (pt in recliner at conclusion of session)   General bed mobility comments: Pt required increased time and min assist to safely exit R side of bed. HOB elevated. min assist with  progression of LEs and pt used bed rail. Vcs throughout for adhereing to posterior precautions  Transfers Overall transfer level: Needs assistance Equipment used: Rolling walker (2 wheeled) Transfers: Sit to/from Stand Sit to Stand: Min assist         General transfer comment: Min assist to stand from EOB with vcs for handplacement and technique toadhere to precautions. Pt struggles folling precautions with transfers.  Ambulation/Gait Ambulation/Gait assistance: Min guard Gait Distance (Feet): 100 Feet Assistive device: Rolling walker (2 wheeled) Gait Pattern/deviations: Step-through pattern;Decreased step length - right;Decreased step length - left;Trunk flexed Gait velocity: decreased   General Gait Details: pt was able to ambulate 100 ft with RW without LOB however does require constant vcs for posture correction and improved gait sequencing. Pt did fatigue quickly with minimal gait distances.   Stairs             Wheelchair Mobility    Modified Rankin (Stroke Patients Only)       Balance Overall balance assessment: Needs assistance Sitting-balance support: Feet supported Sitting balance-Leahy Scale: Good Sitting balance - Comments: no LOB seated EOB, does require vcs for mainating hip precautions   Standing balance support: Bilateral upper extremity supported Standing balance-Leahy Scale: Fair Standing balance comment: Pt does rely on BUE support for safety                            Cognition Arousal/Alertness: Awake/alert Behavior During Therapy: WFL for tasks assessed/performed Overall Cognitive Status: Within Functional Limits for tasks assessed  General Comments: Pt is alert and oriented and agreeable to PT session.      Exercises Total Joint Exercises Ankle Circles/Pumps: AROM;20 reps Quad Sets: AROM;10 reps Gluteal Sets: AROM;10 reps Heel Slides: AROM;10 reps(through minimal ROM) Hip  ABduction/ADduction: AROM;10 reps Straight Leg Raises: AAROM;10 reps(6 inch only)    General Comments        Pertinent Vitals/Pain Pain Assessment: 0-10 Pain Score: 6 (in wt bearing) Pain Location: L hip Pain Descriptors / Indicators: Operative site guarding Pain Intervention(s): Limited activity within patient's tolerance;Monitored during session;Premedicated before session;Repositioned    Home Living                      Prior Function            PT Goals (current goals can now be found in the care plan section) Acute Rehab PT Goals Patient Stated Goal: to go home Progress towards PT goals: Progressing toward goals    Frequency    BID      PT Plan Current plan remains appropriate    Co-evaluation              AM-PAC PT "6 Clicks" Mobility   Outcome Measure  Help needed turning from your back to your side while in a flat bed without using bedrails?: A Little Help needed moving from lying on your back to sitting on the side of a flat bed without using bedrails?: A Little Help needed moving to and from a bed to a chair (including a wheelchair)?: A Little Help needed standing up from a chair using your arms (e.g., wheelchair or bedside chair)?: A Little Help needed to walk in hospital room?: A Little Help needed climbing 3-5 steps with a railing? : A Little 6 Click Score: 18    End of Session Equipment Utilized During Treatment: Gait belt Activity Tolerance: Patient limited by fatigue Patient left: in chair;with call bell/phone within reach;with chair alarm set Nurse Communication: Mobility status PT Visit Diagnosis: Unsteadiness on feet (R26.81);Muscle weakness (generalized) (M62.81);Difficulty in walking, not elsewhere classified (R26.2) Pain - Right/Left: Left Pain - part of body: Hip     Time: 4496-7591 PT Time Calculation (min) (ACUTE ONLY): 21 min  Charges:  $Therapeutic Activity: 8-22 mins                     Julaine Fusi  PTA 07/03/19, 10:53 AM

## 2019-07-03 NOTE — Progress Notes (Signed)
Physical Therapy Treatment Patient Details Name: Cody Howard MRN: [id number] DOB: 14-Jan-1935 Today's Date: 07/03/2019    History of Present Illness Pt admitted for L femoral neck fx secondary to falls s/p unipolar hemiarthoplasty POD 1 at time of evaluation. HIstory includes HTN, DM, TIA, depression, Afib, and CKD 3.    PT Comments    Pt was supine in bed upon arriving with supportive spouse at bedside. He agrees to PT session and reports pain is a little better this afternoon. He was able to exit and re-enter bed with min assist with LE advancement. Vcs for adhering to hip  precautions throughout. Pt was able to stand and ambulate with RW 120 ft with overall improved gait kinematics from this morning session.Pt does however still have antalgic gait pattern and struggles maintaining hip precautions But progressing well overall and demonstrated correct ability to perform HEP. Acute PT will continue to follow per POC. If pt continues to improve as he did this date versus yesterday, may be able to progress to home with HHPT however therapist still feels SNF is appropriate at this time.    Follow Up Recommendations  SNF     Equipment Recommendations  Rolling walker with 5" wheels;3in1 (PT)    Recommendations for Other Services       Precautions / Restrictions Precautions Precautions: Posterior Hip;Fall Precaution Booklet Issued: Yes (comment) Restrictions Weight Bearing Restrictions: No LLE Weight Bearing: Weight bearing as tolerated    Mobility  Bed Mobility Overal bed mobility: Needs Assistance Bed Mobility: Supine to Sit;Sit to Supine     Supine to sit: Min assist Sit to supine: Min assist   General bed mobility comments: Min assist, to exit and re-enter bed  Transfers Overall transfer level: Needs assistance Equipment used: Rolling walker (2 wheeled) Transfers: Sit to/from Stand Sit to Stand: Min assist         General transfer comment: Pt demonstarted saf  ability to STS from EOB. vcs for adhereing to proper hip precautions and vcs for handplacement. Overall pt was able to stand from slightly lowered bed height but still slightly elevated.  Ambulation/Gait Ambulation/Gait assistance: Min guard Gait Distance (Feet): 120 Feet Assistive device: Rolling walker (2 wheeled) Gait Pattern/deviations: Step-through pattern;Decreased step length - right;Decreased step length - left;Trunk flexed Gait velocity: decreased   General Gait Details: Pt ambulated 120 ft without LOB or unsteadiness. He does however continue to have slightly antalgic gait pattern.   Stairs             Wheelchair Mobility    Modified Rankin (Stroke Patients Only)       Balance                                            Cognition Arousal/Alertness: Awake/alert Behavior During Therapy: WFL for tasks assessed/performed Overall Cognitive Status: Within Functional Limits for tasks assessed                                 General Comments: Pt is alert and oriented and agreeable to PT session.      Exercises      General Comments        Pertinent Vitals/Pain Pain Assessment: 0-10 Pain Score: 3  Pain Location: L hip Pain Descriptors / Indicators: Operative site guarding Pain Intervention(s): Limited activity  within patient's tolerance;Monitored during session;Premedicated before session    Home Living                      Prior Function            PT Goals (current goals can now be found in the care plan section) Acute Rehab PT Goals Patient Stated Goal: to go home Progress towards PT goals: Progressing toward goals    Frequency    BID      PT Plan Current plan remains appropriate    Co-evaluation              AM-PAC PT "6 Clicks" Mobility   Outcome Measure  Help needed turning from your back to your side while in a flat bed without using bedrails?: A Little Help needed moving from lying  on your back to sitting on the side of a flat bed without using bedrails?: A Little Help needed moving to and from a bed to a chair (including a wheelchair)?: A Little Help needed standing up from a chair using your arms (e.g., wheelchair or bedside chair)?: A Little Help needed to walk in hospital room?: A Little Help needed climbing 3-5 steps with a railing? : A Little 6 Click Score: 18    End of Session Equipment Utilized During Treatment: Gait belt Activity Tolerance: Patient tolerated treatment well Patient left: in bed;with call bell/phone within reach;with family/visitor present Nurse Communication: Mobility status PT Visit Diagnosis: Unsteadiness on feet (R26.81);Muscle weakness (generalized) (M62.81);Difficulty in walking, not elsewhere classified (R26.2) Pain - Right/Left: Left Pain - part of body: Hip     Time: 1450-1501 PT Time Calculation (min) (ACUTE ONLY): 11 min  Charges:  $Gait Training: 8-22 mins                     Julaine Fusi PTA 07/03/19, 4:57 PM

## 2019-07-03 NOTE — Plan of Care (Signed)

## 2019-07-03 NOTE — Progress Notes (Signed)
PHARMACIST - PHYSICIAN COMMUNICATION  CONCERNING:  Enoxaparin (Lovenox) for DVT Prophylaxis    RECOMMENDATION: Patient was prescribed enoxaprin 30mg  q24 hours for VTE prophylaxis.   Filed Weights   06/27/19 0950  Weight: 88.5 kg (195 lb)    Body mass index is 27.2 kg/m.  Estimated Creatinine Clearance: 39.1 mL/min (A) (by C-G formula based on SCr of 1.55 mg/dL (H)).  Patient is candidate for enoxaparin 40mg  every 24 hours based on CrCl >22ml/min AND Weight >57kg for men   DESCRIPTION: Pharmacy has adjusted enoxaparin dose [facility] policy.  Patient is now receiving enoxaparin 40mg  every 24 hours.   Lu Duffel, PharmD Clinical Pharmacist  07/03/2019 8:57 AM

## 2019-07-04 LAB — GLUCOSE, CAPILLARY
Glucose-Capillary: 177 mg/dL — ABNORMAL HIGH (ref 70–99)
Glucose-Capillary: 252 mg/dL — ABNORMAL HIGH (ref 70–99)
Glucose-Capillary: 192 mg/dL — ABNORMAL HIGH (ref 70–99)
Glucose-Capillary: 187 mg/dL — ABNORMAL HIGH (ref 70–99)

## 2019-07-04 NOTE — Progress Notes (Signed)
PROGRESS NOTE    Cody Howard  WVP:[id number] DOB: 07/13/1934 DOA: 06/27/2019 PCP: [name], MD  Chief Complaint  Patient presents with  . Hip Pain    Brief Narrative: 85 year old male with history of hypertension, diabetes mellitus type 2, TIA, hyperlipidemia, depression, A. fib not on anticoagulant, CKD stage III, chronic systolic CHF with EF of 54% presented with mechanical fall with closed displaced left femoral neck fracture.  Underwent left hemiarthroplasty.  Assessment & Plan:   Principal Problem:   Closed displaced fracture of left femoral neck (HCC) S/p left hemiarthroplasty postop day 6.  Tolerated surgery well and stable postop course.  Pain well controlled on as needed narcotics.  Continue bowel regimen.  Eliquis 2.5 mg twice daily upon discharge for 14 days. PT/OT recommend SNF.  Awaiting insurance authorization.  Active Problems:   Type II diabetes mellitus with renal manifestations (HCC) CBG stable.  Monitor on sliding scale coverage.  A1c of 6.6.  Patient is on Metformin at home and will be resumed upon discharge.    CKD (chronic kidney disease) stage 3, GFR 30-59 ml/min Renal function stable.    Chronic systolic CHF (congestive heart failure), NYHA class 3 (HCC) Last EF of 45%.  Euvolemic.  Continue Lasix and Coreg.    TIA (transient ischemic attack) Continue statin.     Atrial fibrillation, chronic (HCC) Not on anticoagulation, currently receiving Eliquis for DVT prophylaxis postop only..  Continue metoprolol.  Chronic depression Stable.  Continue Zoloft.  Essential hypertension Stable.  Continue Coreg, Cozaar and Lasix.    DVT prophylaxis: Eliquis Code Status: Full code Family Communication: Wife at bedside Disposition:   Status is: Inpatient  Remains inpatient appropriate because:Unsafe d/c plan   Dispo: The patient is from: Home              Anticipated d/c is to: SNF              Anticipated d/c date is: 1 day  Patient currently is medically stable to d/c.  Awaiting insurance authorization for discharge to SNF.  Unsafe discharge to home due to postoperative status.        Consultants:   Orthopedics   Procedures: Left hip hemiarthroplasty      Subjective: Seen and examined.  Has been able to ambulate better.  Denies any pain at present.  Objective: Vitals:   07/03/19 0918 07/03/19 1640 07/03/19 2303 07/04/19 0741  BP: (!) 144/71 (!) 160/68 (!) 153/71 (!) 160/67  Pulse: 70 69 (!) 57 62  Resp:  16 16 20   Temp:  99.1 F (37.3 C) 98.4 F (36.9 C) 98.3 F (36.8 C)  TempSrc:  Oral Oral Oral  SpO2:  96% 92% 97%  Weight:      Height:        Intake/Output Summary (Last 24 hours) at 07/04/2019 1444 Last data filed at 07/04/2019 0355 Gross per 24 hour  Intake --  Output 400 ml  Net -400 ml   Filed Weights   06/27/19 0950  Weight: 88.5 kg    Examination:  General: Elderly male not in distress HEENT: Moist mucosa, supple neck Chest: Clear bilaterally CVs: Normal S1-S2 GI: Soft, nondistended, nontender Musculoskeletal: Clean dressing over the left hip   Data Reviewed: I have personally reviewed following labs and imaging studies  CBC: Recent Labs  Lab 06/28/19 0559 06/29/19 0617 06/30/19 0420 07/03/19 0756  WBC 10.6* 13.5* 11.8* 11.9*  NEUTROABS  --   --   --  9.7*  HGB 11.3* 9.7* 9.4* 11.3*  HCT 33.6* 28.5* 26.7* 31.8*  MCV 97.4 99.3 97.4 94.9  PLT 132* 122* 122* 355    Basic Metabolic Panel: Recent Labs  Lab 06/28/19 0559 06/29/19 0617 06/30/19 0420 07/01/19 0415 07/03/19 0756  NA 137 137 134* 137 137  K 5.2* 4.9 4.5 4.5 4.2  CL 105 107 103 105 104  CO2 24 23 22 23 23   GLUCOSE 219* 215* 169* 178* 200*  BUN 32* 37* 46* 48* 35*  CREATININE 2.04* 2.18* 2.46* 2.06* 1.55*  CALCIUM 8.7* 8.3* 8.2* 8.3* 8.6*    GFR: Estimated Creatinine Clearance: 39.1 mL/min (A) (by C-G formula based on SCr of 1.55 mg/dL (H)).  Liver Function Tests: No results for  input(s): AST, ALT, ALKPHOS, BILITOT, PROT, ALBUMIN in the last 168 hours.  CBG: Recent Labs  Lab 07/03/19 1208 07/03/19 1642 07/03/19 2026 07/04/19 0736 07/04/19 1150  GLUCAP 205* 200* 166* 177* 252*     Recent Results (from the past 240 hour(s))  Respiratory Panel by RT PCR (Flu A&B, Covid) - Nasopharyngeal Swab     Status: None   Collection Time: 06/27/19 11:54 AM   Specimen: Nasopharyngeal Swab  Result Value Ref Range Status   SARS Coronavirus 2 by RT PCR NEGATIVE NEGATIVE Final    Comment: (NOTE) SARS-CoV-2 target nucleic acids are NOT DETECTED. The SARS-CoV-2 RNA is generally detectable in upper respiratoy specimens during the acute phase of infection. The lowest concentration of SARS-CoV-2 viral copies this assay can detect is 131 copies/mL. A negative result does not preclude SARS-Cov-2 infection and should not be used as the sole basis for treatment or other patient management decisions. A negative result may occur with  improper specimen collection/handling, submission of specimen other than nasopharyngeal swab, presence of viral mutation(s) within the areas targeted by this assay, and inadequate number of viral copies (<131 copies/mL). A negative result must be combined with clinical observations, patient history, and epidemiological information. The expected result is Negative. Fact Sheet for Patients:  PinkCheek.be Fact Sheet for Healthcare Providers:  GravelBags.it This test is not yet ap proved or cleared by the [place] FDA and  has been authorized for detection and/or diagnosis of SARS-CoV-2 by FDA under an Emergency Use Authorization (EUA). This EUA will remain  in effect (meaning this test can be used) for the duration of the COVID-19 declaration under Section 564(b)(1) of the Act, 21 U.S.C. section 360bbb-3(b)(1), unless the authorization is terminated or revoked sooner.    Influenza A by  PCR NEGATIVE NEGATIVE Final   Influenza B by PCR NEGATIVE NEGATIVE Final    Comment: (NOTE) The Xpert Xpress SARS-CoV-2/FLU/RSV assay is intended as an aid in  the diagnosis of influenza from Nasopharyngeal swab specimens and  should not be used as a sole basis for treatment. Nasal washings and  aspirates are unacceptable for Xpert Xpress SARS-CoV-2/FLU/RSV  testing. Fact Sheet for Patients: PinkCheek.be Fact Sheet for Healthcare Providers: GravelBags.it This test is not yet approved or cleared by the [place] FDA and  has been authorized for detection and/or diagnosis of SARS-CoV-2 by  FDA under an Emergency Use Authorization (EUA). This EUA will remain  in effect (meaning this test can be used) for the duration of the  Covid-19 declaration under Section 564(b)(1) of the Act, 21  U.S.C. section 360bbb-3(b)(1), unless the authorization is  terminated or revoked. Performed at [facility], [address], [place], [place] [address]   Surgical pcr screen  Status: None   Collection Time: 06/27/19  5:41 PM   Specimen: Nasal Mucosa; Nasal Swab  Result Value Ref Range Status   MRSA, PCR NEGATIVE NEGATIVE Final   Staphylococcus aureus NEGATIVE NEGATIVE Final    Comment: (NOTE) The Xpert SA Assay (FDA approved for NASAL specimens in patients 11 years of age and older), is one component of a comprehensive surveillance program. It is not intended to diagnose infection nor to guide or monitor treatment. Performed at [facility], [address], [place], [place] [address]          Radiology Studies: No results found.      Scheduled Meds: . atorvastatin  40 mg Oral Daily  . carvedilol  3.125 mg Oral BID WC  . docusate sodium  100 mg Oral BID  . donepezil  10 mg Oral Daily  . enoxaparin (LOVENOX) injection  40 mg Subcutaneous Q24H  . insulin aspart  0-9 Units Subcutaneous TID AC & HS  .  melatonin  5 mg Oral QHS  . omega-3 acid ethyl esters  1 g Oral Daily  . Ensure Max Protein  11 oz Oral BID BM  . sertraline  25 mg Oral Daily  . sodium chloride flush  10 mL Intravenous Q8H  . tamsulosin  0.4 mg Oral Daily   Continuous Infusions: . sodium chloride    .  ceFAZolin (ANCEF) IV       LOS: 7 days    Time spent: 25 minutes    Maya Scholer, MD [facility]   To contact the attending provider between 7A-7P or the covering provider during after hours 7P-7A, please log into the web site [email or web address] and access using universal [place] password for that web site. If you do not have the password, please call the hospital operator.  07/04/2019, 2:44 PM

## 2019-07-04 NOTE — Progress Notes (Signed)
Physical Therapy Treatment Patient Details Name: Cody Howard MRN: [id number] DOB: April 12, 1934 Today's Date: 07/04/2019    History of Present Illness Pt admitted for L femoral neck fx secondary to falls s/p unipolar hemiarthoplasty POD 1 at time of evaluation. HIstory includes HTN, DM, TIA, depression, Afib, and CKD 3.    PT Comments    Pt was supine in bed upon arriving with spouse at bedside. He requested not to get OOB this afternoon 2/2 to fatigue. He did agree to strengthening exercises while in bed. HEP handout reviewed and pt performed. See exercises listed below. He tolerated well and continues to be progressing towards PT goals. Discussed recommendation of DC to SNF and pt continues to be in agreement. Awaiting insurance authorization. Acute PT will continue current POC. Pt was in bed with bed alarm in place, call bell in reach, and pt's spouse in room at conclusion of session.       Follow Up Recommendations  SNF     Equipment Recommendations  Rolling walker with 5" wheels;3in1 (PT)    Recommendations for Other Services       Precautions / Restrictions Precautions Precautions: Posterior Hip;Fall Precaution Booklet Issued: Yes (comment) Restrictions Weight Bearing Restrictions: No LLE Weight Bearing: Weight bearing as tolerated    Mobility  Bed Mobility               General bed mobility comments: pt very lethargic and reports feeling very fatigued. Pt requested not to get OOB but was agreeable to performing ther ex handout.  Transfers                    Ambulation/Gait                 Stairs             Wheelchair Mobility    Modified Rankin (Stroke Patients Only)       Balance                                            Cognition Arousal/Alertness: Awake/alert Behavior During Therapy: WFL for tasks assessed/performed Overall Cognitive Status: Within Functional Limits for tasks assessed                                  General Comments: Pt is A and O x 4      Exercises Total Joint Exercises Ankle Circles/Pumps: AROM;20 reps Quad Sets: AROM;Both;10 reps Gluteal Sets: AROM;10 reps Towel Squeeze: 10 reps Short Arc Quad: AROM;Left;10 reps Heel Slides: AROM;Left;10 reps(through minimal ROM) Hip ABduction/ADduction: AROM;10 reps;Left Straight Leg Raises: AAROM;Left;10 reps(6 inches off bed only)    General Comments        Pertinent Vitals/Pain Pain Assessment: No/denies pain Pain Score: 0-No pain Pain Location: L hip Pain Descriptors / Indicators: Operative site guarding Pain Intervention(s): Limited activity within patient's tolerance;Monitored during session;Repositioned    Home Living                      Prior Function            PT Goals (current goals can now be found in the care plan section) Acute Rehab PT Goals Patient Stated Goal: to go home Progress towards PT goals: Progressing toward goals    Frequency  BID      PT Plan Current plan remains appropriate    Co-evaluation              AM-PAC PT "6 Clicks" Mobility   Outcome Measure  Help needed turning from your back to your side while in a flat bed without using bedrails?: A Little Help needed moving from lying on your back to sitting on the side of a flat bed without using bedrails?: A Little Help needed moving to and from a bed to a chair (including a wheelchair)?: A Little Help needed standing up from a chair using your arms (e.g., wheelchair or bedside chair)?: A Little Help needed to walk in hospital room?: A Little Help needed climbing 3-5 steps with a railing? : A Little 6 Click Score: 18    End of Session   Activity Tolerance: Patient tolerated treatment well;Patient limited by fatigue Patient left: in bed;with call bell/phone within reach;with bed alarm set;with family/visitor present Nurse Communication: Mobility status PT Visit Diagnosis: Unsteadiness  on feet (R26.81);Muscle weakness (generalized) (M62.81);Difficulty in walking, not elsewhere classified (R26.2) Pain - Right/Left: Left Pain - part of body: Hip     Time: 3202-3343 PT Time Calculation (min) (ACUTE ONLY): 13 min  Charges:  $Therapeutic Exercise: 8-22 mins                     Julaine Fusi PTA 07/04/19, 3:15 PM

## 2019-07-04 NOTE — Progress Notes (Signed)
Physical Therapy Treatment Patient Details Name: Cody Howard MRN: [id number] DOB: 1934/11/18 Today's Date: 07/04/2019    History of Present Illness Pt admitted for L femoral neck fx secondary to falls s/p unipolar hemiarthoplasty POD 1 at time of evaluation. HIstory includes HTN, DM, TIA, depression, Afib, and CKD 3.    PT Comments    Pt was in supine with HOB elevated about 30 degrees upon arriving. He agrees to PT session and is cooperative throughout. Reports 2/10 pain at rest that elevated to 3/10 pain in wt bearing activities. Pt continues to require min A to exit bed safely with vcs for hip precaution reminders. He stood with min assist and ambulated 160 ft with RW CGA for safety. Pt has poor gait posture but with constant vcs is able to correct. Discussed progress with pt and D/C disposition. Pt does not feel wife can safely take care of him and perform the assistance he requires. Therapist continues to recommend DC to SNF for skilled PT to address strength and mobility deficits. He will benefit form continued skilled PT to assist him with returning to PLOF. Acute PT will continue to follow per current POC. Pt is progressing well.     Follow Up Recommendations  SNF     Equipment Recommendations  Rolling walker with 5" wheels;3in1 (PT)    Recommendations for Other Services       Precautions / Restrictions Precautions Precautions: Posterior Hip;Fall Precaution Booklet Issued: Yes (comment) Restrictions Weight Bearing Restrictions: No LLE Weight Bearing: Weight bearing as tolerated    Mobility  Bed Mobility Overal bed mobility: Needs Assistance Bed Mobility: Supine to Sit     Supine to sit: Min assist     General bed mobility comments: Pt was able to exit R side of bed with increased time and min assist. HOB elevated. pt did use bed rails  Transfers Overall transfer level: Needs assistance Equipment used: Rolling walker (2 wheeled) Transfers: Sit to/from  Stand Sit to Stand: Min assist         General transfer comment: Min assist to STS from EOB and from recliner. Vcs for hand placement and technique improvements. Improved safety and carryover with hip precautions  Ambulation/Gait Ambulation/Gait assistance: Min guard Gait Distance (Feet): 160 Feet Assistive device: Rolling walker (2 wheeled) Gait Pattern/deviations: Step-through pattern;Decreased step length - right;Decreased step length - left;Trunk flexed Gait velocity: decreased   General Gait Details: pt ambulated 160 ft with RW wihtout LOB or unsteadiness. He does require constant cueing for posture correction.   Stairs             Wheelchair Mobility    Modified Rankin (Stroke Patients Only)       Balance Overall balance assessment: Needs assistance Sitting-balance support: Feet supported Sitting balance-Leahy Scale: Good Sitting balance - Comments: no LOB seated EOB, does require vcs for mainating hip precautions   Standing balance support: Bilateral upper extremity supported Standing balance-Leahy Scale: Good Standing balance comment: no unsteadiness noted this date with BUE support on RW                            Cognition Arousal/Alertness: Awake/alert Behavior During Therapy: [facility] for tasks assessed/performed Overall Cognitive Status: Within Functional Limits for tasks assessed                                 General Comments: Pt  is A and O x 4      Exercises      General Comments        Pertinent Vitals/Pain Pain Assessment: 0-10 Pain Score: 3  Faces Pain Scale: Hurts a little bit Pain Location: L hip Pain Descriptors / Indicators: Operative site guarding Pain Intervention(s): Limited activity within patient's tolerance;Monitored during session;Premedicated before session    Home Living                      Prior Function            PT Goals (current goals can now be found in the care plan  section) Acute Rehab PT Goals Patient Stated Goal: to go home Progress towards PT goals: Progressing toward goals    Frequency    BID      PT Plan Current plan remains appropriate    Co-evaluation              AM-PAC PT "6 Clicks" Mobility   Outcome Measure  Help needed turning from your back to your side while in a flat bed without using bedrails?: A Little Help needed moving from lying on your back to sitting on the side of a flat bed without using bedrails?: A Little Help needed moving to and from a bed to a chair (including a wheelchair)?: A Little Help needed standing up from a chair using your arms (e.g., wheelchair or bedside chair)?: A Little Help needed to walk in hospital room?: A Little Help needed climbing 3-5 steps with a railing? : A Little 6 Click Score: 18    End of Session Equipment Utilized During Treatment: Gait belt Activity Tolerance: Patient tolerated treatment well Patient left: in chair;with call bell/phone within reach;with chair alarm set Nurse Communication: Mobility status PT Visit Diagnosis: Unsteadiness on feet (R26.81);Muscle weakness (generalized) (M62.81);Difficulty in walking, not elsewhere classified (R26.2) Pain - Right/Left: Left Pain - part of body: Hip     Time: 5498-2641 PT Time Calculation (min) (ACUTE ONLY): 12 min  Charges:  $Gait Training: 8-22 mins                     Julaine Fusi PTA 07/04/19, 9:59 AM

## 2019-07-04 NOTE — Progress Notes (Signed)
Nutrition Follow-up  RD working remotely.  DOCUMENTATION CODES:   Not applicable  INTERVENTION:  Continue Ensure Max Protein po BID, each supplement provides 150 kcal and 30 grams of protein.  NUTRITION DIAGNOSIS:   Increased nutrient needs related to post-op healing as evidenced by estimated needs.  Ongoing.  GOAL:   Patient will meet greater than or equal to 90% of their needs  Progressing.  MONITOR:   Diet advancement, PO intake, Supplement acceptance, Labs, Weight trends, I & O's, Skin  REASON FOR ASSESSMENT:   Malnutrition Screening Tool, Consult Assessment of nutrition requirement/status  ASSESSMENT:   85 year old male with PMHx of DM, CKD, HLD, arthritis, A-fib, HTN, CHF, TIA admitted after a fall with closed displaced fracture of left femoral neck.   -Patient s/p left hip unipolar hemiarthroplasty on 4/22.  Attempted to call patient over the phone but he was unable to answer. According to chart patient is eating 90-100% of most meals except for breakfast of 4/26 where he was documented to only eat 50%. He is receiving Ensure Max BID.   Medications reviewed and include: Colace 100 mg BID, Novolog 0-9 units TID.  Labs reviewed: CBG [id number].  Diet Order:   Diet Order            Diet heart healthy/carb modified Room service appropriate? Yes; Fluid consistency: Thin  Diet effective now             EDUCATION NEEDS:   No education needs have been identified at this time  Skin:  Skin Assessment: Skin Integrity Issues:(closed incision left hip)  Last BM:  07/02/2019  Height:   Ht Readings from Last 1 Encounters:  06/27/19 5\' 11"  (1.803 m)   Weight:   Wt Readings from Last 1 Encounters:  06/27/19 88.5 kg   BMI:  Body mass index is 27.2 kg/m.  Estimated Nutritional Needs:   Kcal:  2000-2200  Protein:  100-110 grams  Fluid:  1.8-2 L/day  Jacklynn Barnacle, MS, RD, LDN Pager number available on Amion

## 2019-07-05 DIAGNOSIS — M2550 Pain in unspecified joint: Secondary | ICD-10-CM | POA: Diagnosis not present

## 2019-07-05 DIAGNOSIS — I10 Essential (primary) hypertension: Secondary | ICD-10-CM | POA: Diagnosis not present

## 2019-07-05 DIAGNOSIS — S72002D Fracture of unspecified part of neck of left femur, subsequent encounter for closed fracture with routine healing: Secondary | ICD-10-CM | POA: Diagnosis not present

## 2019-07-05 DIAGNOSIS — R69 Illness, unspecified: Secondary | ICD-10-CM | POA: Diagnosis not present

## 2019-07-05 DIAGNOSIS — I482 Chronic atrial fibrillation, unspecified: Secondary | ICD-10-CM | POA: Diagnosis not present

## 2019-07-05 DIAGNOSIS — D649 Anemia, unspecified: Secondary | ICD-10-CM | POA: Diagnosis not present

## 2019-07-05 DIAGNOSIS — Z7901 Long term (current) use of anticoagulants: Secondary | ICD-10-CM | POA: Diagnosis not present

## 2019-07-05 DIAGNOSIS — E785 Hyperlipidemia, unspecified: Secondary | ICD-10-CM | POA: Diagnosis not present

## 2019-07-05 DIAGNOSIS — E1165 Type 2 diabetes mellitus with hyperglycemia: Secondary | ICD-10-CM | POA: Diagnosis not present

## 2019-07-05 DIAGNOSIS — I4892 Unspecified atrial flutter: Secondary | ICD-10-CM | POA: Diagnosis not present

## 2019-07-05 DIAGNOSIS — E119 Type 2 diabetes mellitus without complications: Secondary | ICD-10-CM | POA: Diagnosis not present

## 2019-07-05 DIAGNOSIS — I4891 Unspecified atrial fibrillation: Secondary | ICD-10-CM | POA: Diagnosis not present

## 2019-07-05 DIAGNOSIS — Z4789 Encounter for other orthopedic aftercare: Secondary | ICD-10-CM | POA: Diagnosis not present

## 2019-07-05 DIAGNOSIS — N189 Chronic kidney disease, unspecified: Secondary | ICD-10-CM | POA: Diagnosis not present

## 2019-07-05 DIAGNOSIS — Z7401 Bed confinement status: Secondary | ICD-10-CM | POA: Diagnosis not present

## 2019-07-05 DIAGNOSIS — S72002A Fracture of unspecified part of neck of left femur, initial encounter for closed fracture: Secondary | ICD-10-CM | POA: Diagnosis not present

## 2019-07-05 DIAGNOSIS — R0902 Hypoxemia: Secondary | ICD-10-CM | POA: Diagnosis not present

## 2019-07-05 DIAGNOSIS — N1832 Chronic kidney disease, stage 3b: Secondary | ICD-10-CM | POA: Diagnosis not present

## 2019-07-05 DIAGNOSIS — R262 Difficulty in walking, not elsewhere classified: Secondary | ICD-10-CM | POA: Diagnosis not present

## 2019-07-05 DIAGNOSIS — S72042D Displaced fracture of base of neck of left femur, subsequent encounter for closed fracture with routine healing: Secondary | ICD-10-CM | POA: Diagnosis not present

## 2019-07-05 DIAGNOSIS — M6281 Muscle weakness (generalized): Secondary | ICD-10-CM | POA: Diagnosis not present

## 2019-07-05 DIAGNOSIS — I5022 Chronic systolic (congestive) heart failure: Secondary | ICD-10-CM | POA: Diagnosis not present

## 2019-07-05 DIAGNOSIS — R32 Unspecified urinary incontinence: Secondary | ICD-10-CM | POA: Diagnosis not present

## 2019-07-05 DIAGNOSIS — Z471 Aftercare following joint replacement surgery: Secondary | ICD-10-CM | POA: Diagnosis not present

## 2019-07-05 LAB — RESPIRATORY PANEL BY RT PCR (FLU A&B, COVID)
Influenza A by PCR: NEGATIVE
Influenza B by PCR: NEGATIVE
SARS Coronavirus 2 by RT PCR: NEGATIVE

## 2019-07-05 LAB — GLUCOSE, CAPILLARY
Glucose-Capillary: 147 mg/dL — ABNORMAL HIGH (ref 70–99)
Glucose-Capillary: 210 mg/dL — ABNORMAL HIGH (ref 70–99)

## 2019-07-05 MED ORDER — HYDROXYZINE HCL 10 MG PO TABS
10.0000 mg | ORAL_TABLET | Freq: Once | ORAL | Status: AC
  Administered 2019-07-05: 19:00:00 10 mg via ORAL
  Filled 2019-07-05: qty 1

## 2019-07-05 MED ORDER — SENNOSIDES-DOCUSATE SODIUM 8.6-50 MG PO TABS: 1.0000 | ORAL_TABLET | Freq: Every evening | ORAL | 0 refills | Status: DC | PRN

## 2019-07-05 MED ORDER — ENSURE MAX PROTEIN PO LIQD: 11.0000 [oz_av] | Freq: Two times a day (BID) | ORAL | 0 refills | Status: DC

## 2019-07-05 NOTE — TOC Progression Note (Signed)
Transition of Care [facility]) - Progression Note    Patient Details  Name: Cody Howard MRN: [id number] Date of Birth: 1935-02-10  Transition of Care [facility]) CM/SW [place], RN Phone Number: 07/05/2019, 1:20 PM  Clinical Narrative:    Received notification that the insurance approved the SNF stay, I notified the physician of the approval and requested a Rapid covid to be able to go today.    Expected Discharge Plan: [place]    Expected Discharge Plan and Services Expected Discharge Plan: [place]       Living arrangements for the past 2 months: Single Family Home                                       Social Determinants of Health (SDOH) Interventions    Readmission Risk Interventions No flowsheet data found.

## 2019-07-05 NOTE — Progress Notes (Addendum)
Patient is being discharged to [facility] room 308 this afternoon. Report called to [facility]. AVS printed. Patient belongings packed, NT to prepare patient for transport via EMS. IV removed. Waiting for rapid Covid test results before calling EMS.

## 2019-07-05 NOTE — Plan of Care (Signed)
  Problem: Activity: Goal: Ability to ambulate and perform ADLs will improve Outcome: Progressing   Problem: Clinical Measurements: Goal: Postoperative complications will be avoided or minimized Outcome: Progressing   Problem: Pain Management: Goal: Pain level will decrease Outcome: Progressing   

## 2019-07-05 NOTE — Care Management Important Message (Signed)
Important Message  Patient Details  Name: Cody Howard MRN: [id number] Date of Birth: Oct 28, 1934   Medicare Important Message Given:  Yes     Juliann Pulse A Shaida Route 07/05/2019, 10:29 AM

## 2019-07-05 NOTE — Discharge Summary (Addendum)
Physician Discharge Summary  Cody Howard XKG:[id number] DOB: Mar 17, 1934 DOA: 06/27/2019  PCP: [name], MD  Admit date: 06/27/2019 Discharge date: 07/05/2019  Admitted From: Home Disposition: Skilled nursing facility  Recommendations for Outpatient Follow-up:  Follow-up with orthopedics in 2 weeks. Follow-up with MD at SNF in 1 week  Equipment/Devices: As per therapy at SNF  Discharge Condition: Fair CODE STATUS: Full code Diet recommendation: Heart Healthy/carb modified   Discharge Diagnoses:  Principal Problem:   Closed displaced fracture of left femoral neck (HCC)   Active Problems:   Type II diabetes mellitus with renal manifestations (HCC)   CKD (chronic kidney disease) stage 3, GFR 30-59 ml/min   Hyperlipidemia, mixed   Chronic systolic CHF (congestive heart failure), NYHA class 3 (HCC)   Essential hypertension   Atrial fibrillation, chronic (HCC)   Depression   Brief narrative/HPI 85 year old male with history of hypertension, diabetes mellitus type 2, TIA, hyperlipidemia, depression, A. fib not on anticoagulant, CKD stage III, chronic systolic CHF with EF of 70% presented with mechanical fall with closed displaced left femoral neck fracture.  Underwent left hemiarthroplasty.  Hospital course   Principal Problem:   Closed displaced fracture of left femoral neck (HCC) S/p left hemiarthroplasty postop day 7.  Tolerated surgery well and stable postop course.  Pain well controlled on as needed narcotics.  Continue bowel regimen.  Eliquis 2.5 mg twice daily upon discharge for 14 days.  (See below.  I recommend patient should continue Eliquis until seen by his cardiologist given his high CHA2DS2-VASc score). PT/OT recommend SNF.  Stable for discharge.  Active Problems:   Type II diabetes mellitus with renal manifestations (HCC) CBG stable.    Stable on sliding scale coverage.  A1c of 6.6.  Patient is on Metformin at home and will be resumed upon  discharge.    CKD (chronic kidney disease) stage 3B, GFR 30-59 ml/min Renal function stable at baseline.    Chronic systolic CHF (congestive heart failure), NYHA class 3 (HCC) Last EF of 45%.  Euvolemic.  Continue Lasix and Coreg.    TIA (transient ischemic attack) Continue statin.   Atrial fibrillation, chronic (HCC) Not on anticoagulation, currently receiving Eliquis for DVT prophylaxis postop only..  Continue metoprolol.  Patient's CHA2DS2-VASc score is 7 ( CHF, DM, TIA, age>75 and HTN).  Patient denies any frequent fall risk or history of GI bleed.  Has history of chronic anemia with stable hemoglobin. Patient showed be on anticoagulation unless contraindicated.  EGD and colonoscopy from 10/2017 were negative for any ulcers or bleeding. I think it is reasonable for him to continue Eliquis for now until he is seen by his PCP or cardiologist.   Chronic depression  Stable.  Continue Zoloft.  Essential hypertension Stable.  Continue Coreg, Cozaar and Lasix.   Procedure: Left hip hemiarthroplasty Consults: Orthopedics  Family communication: Wife at bedside  Discharge Instructions   Allergies as of 07/05/2019   No Known Allergies     Medication List    STOP taking these medications   cephALEXin 500 MG capsule Commonly known as: [place] these medications   apixaban 2.5 MG Tabs tablet Commonly known as: Eliquis Take 1 tablet (2.5 mg total) by mouth 2 (two) times daily.   carvedilol 3.125 MG tablet Commonly known as: COREG Take 1 tablet (3.125 mg total) by mouth 2 (two) times daily with a meal.   diphenhydrAMINE 50 MG capsule Commonly known as: BENADRYL Take 50 mg by mouth at bedtime  as needed for allergies or sleep.   donepezil 10 MG tablet Commonly known as: ARICEPT Take 1 tablet (10 mg total) by mouth daily. [place]   Ensure Max Protein Liqd Take 330 mLs (11 oz total) by mouth 2 (two) times daily between meals.   Fish Oil 1000 MG  Caps Take 1 capsule (1,000 mg total) by mouth daily.   furosemide 20 MG tablet Commonly known as: LASIX Take 1 tablet (20 mg total) by mouth daily.   losartan 100 MG tablet Commonly known as: COZAAR Take 0.5 tablets (50 mg total) by mouth daily.   melatonin 5 MG Tabs Take 5 mg by mouth at bedtime.   metFORMIN 750 MG 24 hr tablet Commonly known as: GLUCOPHAGE-XR Take 1 tablet (750 mg total) by mouth daily with breakfast.   ondansetron 4 MG tablet Commonly known as: ZOFRAN Take 1 tablet (4 mg total) by mouth every 6 (six) hours as needed for nausea.   oxyCODONE 5 MG immediate release tablet Commonly known as: Oxy IR/ROXICODONE Take 1 tablet (5 mg total) by mouth every 4 (four) hours as needed for moderate pain (pain score 4-6).   senna-docusate 8.6-50 MG tablet Commonly known as: Senokot-S Take 1 tablet by mouth at bedtime as needed for mild constipation.   sertraline 25 MG tablet Commonly known as: ZOLOFT Take 25 mg by mouth daily.   simvastatin 80 MG tablet Commonly known as: ZOCOR Take 1 tablet (80 mg total) by mouth every evening. What changed: when to take this   tamsulosin 0.4 MG Caps capsule Commonly known as: FLOMAX Take 0.4 mg by mouth daily.   traMADol 50 MG tablet Commonly known as: ULTRAM Take 1 tablet (50 mg total) by mouth every 6 (six) hours as needed for moderate pain.            Durable Medical Equipment  (From admission, onward)         Start     Ordered   07/01/19 0802  For home use only DME Walker rolling  Once    Question Answer Comment  Walker: With 5 Inch Wheels   Patient needs a walker to treat with the following condition Weakness      07/01/19 0802          Contact information for follow-up providers    Lattie Corns, PA-C Follow up in 14 day(s).   Specialty: Physician Assistant Why: Electa Sniff information: [place] [address] [phone]             Contact information for after-discharge care    Destination    [facility] Preferred SNF .   Service: Skilled Nursing Contact information: [address] [place] [phone]                 No Known Allergies    Procedures/Studies: DG Chest 1 View  Result Date: 06/27/2019 CLINICAL DATA:  Fall with left hip fracture. EXAM: CHEST  1 VIEW COMPARISON:  06/28/2016 FINDINGS: Cardiac silhouette is normal in size. No mediastinal or hilar masses. No evidence of adenopathy. Clear lungs.  No pleural effusion or pneumothorax. Skeletal structures are grossly intact. IMPRESSION: No active disease. Electronically Signed   By: Lajean Manes M.D.   On: 06/27/2019 11:13   DG Abd 1 View  Result Date: 07/01/2019 CLINICAL DATA:  Constipation. EXAM: ABDOMEN - 1 VIEW COMPARISON:  None. FINDINGS: There is a small stool ball in the  rectum. No bowel obstruction or ileus identified. The patient is status post left hip replacement. The femoral component is not completely imaged. IMPRESSION: No significant abnormalities are noted. Electronically Signed   By: Dorise Bullion III M.D   On: 07/01/2019 15:22   DG HIP UNILAT W OR W/O PELVIS 2-3 VIEWS LEFT  Result Date: 06/28/2019 CLINICAL DATA:  Postoperative left hip arthroplasty EXAM: DG HIP (WITH OR WITHOUT PELVIS) 2-3V LEFT COMPARISON:  Radiograph 06/27/2019 FINDINGS: There are postsurgical changes from left hip hemiarthroplasty with resection of the left femoral head and neck and placement of an articulating femoral stem. Alignment is within expected normals. Expected postsurgical soft tissue changes are noted with overlying swelling and intra-articular gas. No acute complication is evident. IMPRESSION: Satisfactory postoperative appearance status post left hip hemiarthroplasty. Electronically Signed   By: Lovena Le M.D.   On: 06/28/2019 17:06   DG Hip Unilat With Pelvis 2-3 Views Left  Result Date:  06/27/2019 CLINICAL DATA:  Fall.  Left hip pain. EXAM: DG HIP (WITH OR WITHOUT PELVIS) 2-3V LEFT COMPARISON:  None. FINDINGS: Non comminuted mid left femoral neck fracture. Distal fracture component has mildly displaced superiorly by approximately 1.5 cm. There is mild varus angulation as well as apex anterior angulation. No other fractures.  No bone lesions. Hip joints, SI joints and symphysis pubis are normally aligned. Soft tissues are unremarkable. IMPRESSION: 1. Non comminuted, mildly displaced and angulated left femoral neck fracture. No dislocation. Electronically Signed   By: Lajean Manes M.D.   On: 06/27/2019 11:12       Subjective: Left hip pain much improved and able to ambulate better  Discharge Exam: Vitals:   07/05/19 0333 07/05/19 0720  BP:  (!) 151/67  Pulse:  61  Resp:  18  Temp: 98.7 F (37.1 C) 99.2 F (37.3 C)  SpO2:  96%   Vitals:   07/04/19 1722 07/05/19 0016 07/05/19 0333 07/05/19 0720  BP: (!) 167/62 (!) 152/62  (!) 151/67  Pulse: (!) 59 63  61  Resp: 20 18  18   Temp: 98.2 F (36.8 C)  98.7 F (37.1 C) 99.2 F (37.3 C)  TempSrc: Oral   Oral  SpO2: 97% 95%  96%  Weight:      Height:        General: Elderly male not in distress HEENT: Moist mucosa, supple Chest: Clear CVs: Normal S1-S2, no murmurs GI: Soft, nondistended, nontender Musculoskeletal: Warm, clean dressing over left hip    The results of significant diagnostics from this hospitalization (including imaging, microbiology, ancillary and laboratory) are listed below for reference.     Microbiology: Recent Results (from the past 240 hour(s))  Respiratory Panel by RT PCR (Flu A&B, Covid) - Nasopharyngeal Swab     Status: None   Collection Time: 06/27/19 11:54 AM   Specimen: Nasopharyngeal Swab  Result Value Ref Range Status   SARS Coronavirus 2 by RT PCR NEGATIVE NEGATIVE Final    Comment: (NOTE) SARS-CoV-2 target nucleic acids are NOT DETECTED. The SARS-CoV-2 RNA is generally  detectable in upper respiratoy specimens during the acute phase of infection. The lowest concentration of SARS-CoV-2 viral copies this assay can detect is 131 copies/mL. A negative result does not preclude SARS-Cov-2 infection and should not be used as the sole basis for treatment or other patient management decisions. A negative result may occur with  improper specimen collection/handling, submission of specimen other than nasopharyngeal swab, presence of viral mutation(s) within the areas targeted by this assay, and  inadequate number of viral copies (<131 copies/mL). A negative result must be combined with clinical observations, patient history, and epidemiological information. The expected result is Negative. Fact Sheet for Patients:  PinkCheek.be Fact Sheet for Healthcare Providers:  GravelBags.it This test is not yet ap proved or cleared by the [place] FDA and  has been authorized for detection and/or diagnosis of SARS-CoV-2 by FDA under an Emergency Use Authorization (EUA). This EUA will remain  in effect (meaning this test can be used) for the duration of the COVID-19 declaration under Section 564(b)(1) of the Act, 21 U.S.C. section 360bbb-3(b)(1), unless the authorization is terminated or revoked sooner.    Influenza A by PCR NEGATIVE NEGATIVE Final   Influenza B by PCR NEGATIVE NEGATIVE Final    Comment: (NOTE) The Xpert Xpress SARS-CoV-2/FLU/RSV assay is intended as an aid in  the diagnosis of influenza from Nasopharyngeal swab specimens and  should not be used as a sole basis for treatment. Nasal washings and  aspirates are unacceptable for Xpert Xpress SARS-CoV-2/FLU/RSV  testing. Fact Sheet for Patients: PinkCheek.be Fact Sheet for Healthcare Providers: GravelBags.it This test is not yet approved or cleared by the [place] FDA and  has been  authorized for detection and/or diagnosis of SARS-CoV-2 by  FDA under an Emergency Use Authorization (EUA). This EUA will remain  in effect (meaning this test can be used) for the duration of the  Covid-19 declaration under Section 564(b)(1) of the Act, 21  U.S.C. section 360bbb-3(b)(1), unless the authorization is  terminated or revoked. Performed at [facility], [address] [place], [place] [address]   Surgical pcr screen     Status: None   Collection Time: 06/27/19  5:41 PM   Specimen: Nasal Mucosa; Nasal Swab  Result Value Ref Range Status   MRSA, PCR NEGATIVE NEGATIVE Final   Staphylococcus aureus NEGATIVE NEGATIVE Final    Comment: (NOTE) The Xpert SA Assay (FDA approved for NASAL specimens in patients 51 years of age and older), is one component of a comprehensive surveillance program. It is not intended to diagnose infection nor to guide or monitor treatment. Performed at [facility], [address] [place], [place] [address]      Labs: BNP (last 3 results) Recent Labs    06/27/19 1159  BNP 91.4   Basic Metabolic Panel: Recent Labs  Lab 06/29/19 0617 06/30/19 0420 07/01/19 0415 07/03/19 0756  NA 137 134* 137 137  K 4.9 4.5 4.5 4.2  CL 107 103 105 104  CO2 23 22 23 23   GLUCOSE 215* 169* 178* 200*  BUN 37* 46* 48* 35*  CREATININE 2.18* 2.46* 2.06* 1.55*  CALCIUM 8.3* 8.2* 8.3* 8.6*   Liver Function Tests: No results for input(s): AST, ALT, ALKPHOS, BILITOT, PROT, ALBUMIN in the last 168 hours. No results for input(s): LIPASE, AMYLASE in the last 168 hours. No results for input(s): AMMONIA in the last 168 hours. CBC: Recent Labs  Lab 06/29/19 0617 06/30/19 0420 07/03/19 0756  WBC 13.5* 11.8* 11.9*  NEUTROABS  --   --  9.7*  HGB 9.7* 9.4* 11.3*  HCT 28.5* 26.7* 31.8*  MCV 99.3 97.4 94.9  PLT 122* 122* 175   Cardiac Enzymes: No results for input(s): CKTOTAL, CKMB, CKMBINDEX, TROPONINI in the last 168 hours. BNP: Invalid  input(s): POCBNP CBG: Recent Labs  Lab 07/04/19 1150 07/04/19 1722 07/04/19 2132 07/05/19 0722 07/05/19 1254  GLUCAP 252* 192* 187* 147* 210*   D-Dimer No results for input(s): DDIMER  in the last 72 hours. Hgb A1c No results for input(s): HGBA1C in the last 72 hours. Lipid Profile No results for input(s): CHOL, HDL, LDLCALC, TRIG, CHOLHDL, LDLDIRECT in the last 72 hours. Thyroid function studies No results for input(s): TSH, T4TOTAL, T3FREE, THYROIDAB in the last 72 hours.  Invalid input(s): FREET3 Anemia work up No results for input(s): VITAMINB12, FOLATE, FERRITIN, TIBC, IRON, RETICCTPCT in the last 72 hours. Urinalysis    Component Value Date/Time   COLORURINE YELLOW (A) 01/01/2019 1201   APPEARANCEUR CLEAR (A) 01/01/2019 1201   LABSPEC 1.013 01/01/2019 1201   PHURINE 5.0 01/01/2019 1201   GLUCOSEU NEGATIVE 01/01/2019 1201   HGBUR NEGATIVE 01/01/2019 1201   [place] 01/01/2019 [place] 01/01/2019 1201   PROTEINUR 100 (A) 01/01/2019 1201   NITRITE NEGATIVE 01/01/2019 1201   LEUKOCYTESUR NEGATIVE 01/01/2019 1201   Sepsis Labs Invalid input(s): PROCALCITONIN,  WBC,  LACTICIDVEN Microbiology Recent Results (from the past 240 hour(s))  Respiratory Panel by RT PCR (Flu A&B, Covid) - Nasopharyngeal Swab     Status: None   Collection Time: 06/27/19 11:54 AM   Specimen: Nasopharyngeal Swab  Result Value Ref Range Status   SARS Coronavirus 2 by RT PCR NEGATIVE NEGATIVE Final    Comment: (NOTE) SARS-CoV-2 target nucleic acids are NOT DETECTED. The SARS-CoV-2 RNA is generally detectable in upper respiratoy specimens during the acute phase of infection. The lowest concentration of SARS-CoV-2 viral copies this assay can detect is 131 copies/mL. A negative result does not preclude SARS-Cov-2 infection and should not be used as the sole basis for treatment or other patient management decisions. A negative result may occur with  improper specimen  collection/handling, submission of specimen other than nasopharyngeal swab, presence of viral mutation(s) within the areas targeted by this assay, and inadequate number of viral copies (<131 copies/mL). A negative result must be combined with clinical observations, patient history, and epidemiological information. The expected result is Negative. Fact Sheet for Patients:  PinkCheek.be Fact Sheet for Healthcare Providers:  GravelBags.it This test is not yet ap proved or cleared by the [place] FDA and  has been authorized for detection and/or diagnosis of SARS-CoV-2 by FDA under an Emergency Use Authorization (EUA). This EUA will remain  in effect (meaning this test can be used) for the duration of the COVID-19 declaration under Section 564(b)(1) of the Act, 21 U.S.C. section 360bbb-3(b)(1), unless the authorization is terminated or revoked sooner.    Influenza A by PCR NEGATIVE NEGATIVE Final   Influenza B by PCR NEGATIVE NEGATIVE Final    Comment: (NOTE) The Xpert Xpress SARS-CoV-2/FLU/RSV assay is intended as an aid in  the diagnosis of influenza from Nasopharyngeal swab specimens and  should not be used as a sole basis for treatment. Nasal washings and  aspirates are unacceptable for Xpert Xpress SARS-CoV-2/FLU/RSV  testing. Fact Sheet for Patients: PinkCheek.be Fact Sheet for Healthcare Providers: GravelBags.it This test is not yet approved or cleared by the [place] FDA and  has been authorized for detection and/or diagnosis of SARS-CoV-2 by  FDA under an Emergency Use Authorization (EUA). This EUA will remain  in effect (meaning this test can be used) for the duration of the  Covid-19 declaration under Section 564(b)(1) of the Act, 21  U.S.C. section 360bbb-3(b)(1), unless the authorization is  terminated or revoked. Performed at [facility],  [address] [place], [place] [address]   Surgical pcr screen     Status: None   Collection  Time: 06/27/19  5:41 PM   Specimen: Nasal Mucosa; Nasal Swab  Result Value Ref Range Status   MRSA, PCR NEGATIVE NEGATIVE Final   Staphylococcus aureus NEGATIVE NEGATIVE Final    Comment: (NOTE) The Xpert SA Assay (FDA approved for NASAL specimens in patients 46 years of age and older), is one component of a comprehensive surveillance program. It is not intended to diagnose infection nor to guide or monitor treatment. Performed at [facility], [address] [place], [place] [address]      Time coordinating discharge: 35 minutes  SIGNED:   Louellen Molder, MD  [facility] 07/05/2019, 1:28 PM Pager   If 7PM-7AM, please contact night-coverage [email or web address] Password [email or web address]

## 2019-07-05 NOTE — TOC Transition Note (Signed)
Transition of Care [facility]) - CM/SW Discharge Note   Patient Details  Name: Cody Howard MRN: [id number] Date of Birth: October 07, 1934  [facility] CM/SW Contact:  Su Hilt, RN Phone Number: 07/05/2019, 3:23 PM   Clinical Narrative:    Rapid Covid results are back, The bedside Nurse has called report, RNCM has called EMS and there is only 1 ahead of him.  The wife is aware of the Discharge, He will go to room 803 at [facility]   Final next level of care: Skilled Nursing Facility Barriers to Discharge: Barriers Resolved   Patient Goals and CMS Choice Patient states their goals for this hospitalization and ongoing recovery are:: go to rehab      Discharge Placement              Patient chooses bed at: [facility] [place] Patient to be transferred to facility by: EMS Name of family member notified: Penelope Patient and family notified of of transfer: 07/05/19  Discharge Plan and Services                                     Social Determinants of Health (SDOH) Interventions     Readmission Risk Interventions No flowsheet data found.

## 2019-07-05 NOTE — Progress Notes (Addendum)
Physical Therapy Treatment Patient Details Name: Cody Howard MRN: [id number] DOB: February 04, 1935 Today's Date: 07/05/2019    History of Present Illness Pt admitted for L femoral neck fx secondary to falls s/p unipolar hemiarthoplasty POD 1 at time of evaluation. HIstory includes HTN, DM, TIA, depression, Afib, and CKD 3.    PT Comments    Pt ready for session.  Feeling good today.  OOB on right with mod a x 1 and struggles.  Does better on left side.  Steady in sitting.  Stood with min a x 1.  He is able to walk x 1 laps around unit with RW and min a x 1.  Gait speed is varied and does increase when he is fatigue affecting overall safety.  He has decreased step height and length and at times lets walker go too far ahead of him needing cues to correct.  Pt remains at an increased fall risk and further therapy services are recommended to prevent fall and readmission. Participated in exercises as described below.  During session pt shows me some stitches he has in his hand.  Stated he had an injury about 3 weeks ago that lead to a prompt care visit.  He was told to return in 2 weeks to have them removed but has been here and unable to go.  Stated he mentioned them in ER but not since.  Wound healing well with no redness.  PM to MD to make him aware of pt and wife's request to see if they could be removed prior to transfer to SNF.   Follow Up Recommendations  SNF     Equipment Recommendations  Rolling walker with 5" wheels;3in1 (PT)    Recommendations for Other Services       Precautions / Restrictions Precautions Precautions: Posterior Hip;Fall Precaution Booklet Issued: Yes (comment) Restrictions Weight Bearing Restrictions: No LLE Weight Bearing: Weight bearing as tolerated    Mobility  Bed Mobility Overal bed mobility: Needs Assistance Bed Mobility: Supine to Sit     Supine to sit: Mod assist     General bed mobility comments: To R side today which is more challenging for  home.  Transfers Overall transfer level: Needs assistance Equipment used: Rolling walker (2 wheeled) Transfers: Sit to/from Stand Sit to Stand: Min assist            Ambulation/Gait Ambulation/Gait assistance: Herbalist (Feet): 160 Feet Assistive device: Rolling walker (2 wheeled)   Gait velocity: decreased   General Gait Details: irregular speed, decreased step height and length with cues for walker positioning   Stairs             Wheelchair Mobility    Modified Rankin (Stroke Patients Only)       Balance Overall balance assessment: Needs assistance Sitting-balance support: Feet supported Sitting balance-Leahy Scale: Good     Standing balance support: Bilateral upper extremity supported Standing balance-Leahy Scale: Fair                              Cognition Arousal/Alertness: Awake/alert Behavior During Therapy: WFL for tasks assessed/performed Overall Cognitive Status: Within Functional Limits for tasks assessed                                 General Comments: Pt is A and O x 4      Exercises Other Exercises  Other Exercises: standing ex - 2 x 10.  heel raises, SLR and marches in place with min a x 1 for balance    General Comments        Pertinent Vitals/Pain Pain Assessment: No/denies pain    Home Living                      Prior Function            PT Goals (current goals can now be found in the care plan section) Progress towards PT goals: Progressing toward goals    Frequency    BID      PT Plan      Co-evaluation              AM-PAC PT "6 Clicks" Mobility   Outcome Measure  Help needed turning from your back to your side while in a flat bed without using bedrails?: A Little Help needed moving from lying on your back to sitting on the side of a flat bed without using bedrails?: A Little Help needed moving to and from a bed to a chair (including a  wheelchair)?: A Little Help needed standing up from a chair using your arms (e.g., wheelchair or bedside chair)?: A Little Help needed to walk in hospital room?: A Little Help needed climbing 3-5 steps with a railing? : A Little 6 Click Score: 18    End of Session Equipment Utilized During Treatment: Gait belt Activity Tolerance: Patient tolerated treatment well Patient left: in chair;with call bell/phone within reach;with chair alarm set   Pain - Right/Left: Left Pain - part of body: Hip     Time: 1029-1040 PT Time Calculation (min) (ACUTE ONLY): 11 min  Charges:  $Gait Training: 8-22 mins                    Chesley Noon, PTA 07/05/19, 11:03 AM

## 2019-07-05 NOTE — TOC Progression Note (Signed)
Transition of Care [facility]) - Progression Note    Patient Details  Name: Cody Howard MRN: [id number] Date of Birth: 1934/08/13  Transition of Care [facility]) CM/SW [place], RN Phone Number: 07/05/2019, 9:31 AM  Clinical Narrative:    Reached out to Tammy with [facility] to inquire about insurance Auth, Still pending   Expected Discharge Plan: [place]    Expected Discharge Plan and Services Expected Discharge Plan: [place]       Living arrangements for the past 2 months: Single Family Home                                       Social Determinants of Health (SDOH) Interventions    Readmission Risk Interventions No flowsheet data found.

## 2019-07-05 NOTE — Progress Notes (Signed)
PT Cancellation Note  Patient Details Name: Cody Howard MRN: [id number] DOB: [birth year]   Cancelled Treatment:    Reason Eval/Treat Not Completed: Fatigue/lethargy limiting ability to participate   Attempted PM session x 2.  Refused due to fatigue on first and sleeping on second.  Will offer again as time allows.   Chesley Noon 07/05/2019, 12:36 PM

## 2019-07-05 NOTE — Progress Notes (Signed)
EMS here to pick up patient to transport

## 2019-07-05 NOTE — Progress Notes (Signed)
EMS called per CM

## 2019-07-06 ENCOUNTER — Telehealth: Payer: Self-pay | Admitting: Family Medicine

## 2019-07-06 NOTE — Telephone Encounter (Signed)
Sp[facility] to [facility]

## 2019-07-06 NOTE — Telephone Encounter (Signed)
Copied from [place] [id number]. Topic: General - Other >> Jul 06, 2019  8:18 AM Keene Breath wrote: Reason for CRM: Patient's wife would like for Baxter Flattery to call her regarding husbands's fall.  CB#  [phone]

## 2019-07-10 ENCOUNTER — Ambulatory Visit: Payer: Medicare HMO | Admitting: Family Medicine

## 2019-07-10 DIAGNOSIS — E119 Type 2 diabetes mellitus without complications: Secondary | ICD-10-CM | POA: Diagnosis not present

## 2019-07-10 DIAGNOSIS — S72002D Fracture of unspecified part of neck of left femur, subsequent encounter for closed fracture with routine healing: Secondary | ICD-10-CM | POA: Diagnosis not present

## 2019-07-10 DIAGNOSIS — E785 Hyperlipidemia, unspecified: Secondary | ICD-10-CM | POA: Diagnosis not present

## 2019-07-10 DIAGNOSIS — I10 Essential (primary) hypertension: Secondary | ICD-10-CM | POA: Diagnosis not present

## 2019-07-10 DIAGNOSIS — I4891 Unspecified atrial fibrillation: Secondary | ICD-10-CM | POA: Diagnosis not present

## 2019-07-10 DIAGNOSIS — N189 Chronic kidney disease, unspecified: Secondary | ICD-10-CM | POA: Diagnosis not present

## 2019-07-10 DIAGNOSIS — R69 Illness, unspecified: Secondary | ICD-10-CM | POA: Diagnosis not present

## 2019-07-10 DIAGNOSIS — Z7901 Long term (current) use of anticoagulants: Secondary | ICD-10-CM | POA: Diagnosis not present

## 2019-07-10 DIAGNOSIS — D649 Anemia, unspecified: Secondary | ICD-10-CM | POA: Diagnosis not present

## 2019-07-11 ENCOUNTER — Other Ambulatory Visit: Payer: Self-pay | Admitting: Family Medicine

## 2019-07-11 DIAGNOSIS — I429 Cardiomyopathy, unspecified: Secondary | ICD-10-CM

## 2019-07-11 DIAGNOSIS — E785 Hyperlipidemia, unspecified: Secondary | ICD-10-CM

## 2019-07-11 DIAGNOSIS — N183 Chronic kidney disease, stage 3 unspecified: Secondary | ICD-10-CM

## 2019-07-11 DIAGNOSIS — E1169 Type 2 diabetes mellitus with other specified complication: Secondary | ICD-10-CM

## 2019-07-11 NOTE — Telephone Encounter (Signed)
Requested Prescriptions  Pending Prescriptions Disp Refills  . losartan (COZAAR) 100 MG tablet [Pharmacy Med Name: LOSARTAN POTASSIUM 100 MG TAB] 45 tablet 0    Sig: TAKE 1/2 TABLET BY MOUTH DAILY     Cardiovascular:  Angiotensin Receptor Blockers Failed - 07/11/2019  8:26 AM      Failed - Cr in normal range and within 180 days    Creatinine, Ser  Date Value Ref Range Status  07/03/2019 1.55 (H) 0.61 - 1.24 mg/dL Final         Failed - Last BP in normal range    BP Readings from Last 1 Encounters:  07/05/19 (!) 151/67         Passed - K in normal range and within 180 days    Potassium  Date Value Ref Range Status  07/03/2019 4.2 3.5 - 5.1 mmol/L Final         Passed - Patient is not pregnant      Passed - Valid encounter within last 6 months    Recent Outpatient Visits          1 month ago Malaise and fatigue   [facility] [name], MD   4 months ago Malaise and fatigue   [facility] [name], MD   4 months ago Malaise and fatigue   [facility] [name], MD   8 months ago Type 2 diabetes mellitus without complication, without long-term current use of insulin ([place])   [facility] [name], MD   1 year ago Type 2 diabetes mellitus without complication, without long-term current use of insulin ([place])   [facility] [name], MD      Future Appointments            In 5 days [name], MD [facility], [place]           . simvastatin (ZOCOR) 80 MG tablet [Pharmacy Med Name: SIMVASTATIN 80 MG TABLET] 90 tablet 1    Sig: TAKE 1 TABLET BY MOUTH EVERY EVENING     Cardiovascular:  Antilipid - Statins Failed - 07/11/2019  8:26 AM      Failed - Total Cholesterol in normal range and within 360 days    Cholesterol, Total  Date Value Ref Range Status  03/12/2019 257 (H) 100 - 199 mg/dL Final         Failed - LDL in normal range and within 360 days    LDL Chol Calc (NIH)  Date Value  Ref Range Status  03/12/2019 167 (H) 0 - 99 mg/dL Final         Failed - Triglycerides in normal range and within 360 days    Triglycerides  Date Value Ref Range Status  03/12/2019 172 (H) 0 - 149 mg/dL Final         Passed - HDL in normal range and within 360 days    HDL  Date Value Ref Range Status  03/12/2019 59 >39 mg/dL Final         Passed - Patient is not pregnant      Passed - Valid encounter within last 12 months    Recent Outpatient Visits          1 month ago Malaise and fatigue   [facility] [name], MD   4 months ago Malaise and fatigue   [facility] [name], MD   4  months ago Malaise and fatigue   [facility] [name], MD   8 months ago Type 2 diabetes mellitus without complication, without long-term current use of insulin ([place])   [facility] [name], MD   1 year ago Type 2 diabetes mellitus without complication, without long-term current use of insulin ([place])   [facility] [name], MD      Future Appointments            In 5 days [name], MD [facility], [facility]

## 2019-07-16 ENCOUNTER — Other Ambulatory Visit: Payer: Self-pay | Admitting: Family Medicine

## 2019-07-16 ENCOUNTER — Ambulatory Visit: Payer: Medicare HMO | Admitting: Family Medicine

## 2019-07-16 ENCOUNTER — Ambulatory Visit: Payer: Medicare HMO

## 2019-07-16 DIAGNOSIS — E119 Type 2 diabetes mellitus without complications: Secondary | ICD-10-CM

## 2019-07-16 NOTE — Telephone Encounter (Signed)
Requested medication (s) are due for refill today: n/a  Requested medication (s) are on the active medication list: No  Last refill:  04/13/19  Future visit scheduled: Yes  Notes to clinic:  Test strips are not on medication list.    Requested Prescriptions  Pending Prescriptions Disp Refills   ONETOUCH ULTRA test strip [Pharmacy Med Name: [place] TEST STRP] 100 strip 0    Sig: USE TO TEST ONCE A DAY      Endocrinology: Diabetes - Testing Supplies Passed - 07/16/2019  1:27 AM      Passed - Valid encounter within last 12 months    Recent Outpatient Visits           1 month ago Malaise and fatigue   [facility] [name], MD   4 months ago Malaise and fatigue   [facility] [name], [facility]   4 months ago Malaise and fatigue   [facility] [name], MD   8 months ago Type 2 diabetes mellitus without complication, without long-term current use of insulin ([place])   [facility] [name], MD   1 year ago Type 2 diabetes mellitus without complication, without long-term current use of insulin ([place])   [facility] [name], MD       Future Appointments             In 1 week [name], MD [facility], [facility]

## 2019-07-20 DIAGNOSIS — E785 Hyperlipidemia, unspecified: Secondary | ICD-10-CM | POA: Diagnosis not present

## 2019-07-20 DIAGNOSIS — E1165 Type 2 diabetes mellitus with hyperglycemia: Secondary | ICD-10-CM | POA: Diagnosis not present

## 2019-07-20 DIAGNOSIS — K5900 Constipation, unspecified: Secondary | ICD-10-CM | POA: Diagnosis not present

## 2019-07-20 DIAGNOSIS — R69 Illness, unspecified: Secondary | ICD-10-CM | POA: Diagnosis not present

## 2019-07-20 DIAGNOSIS — S72042D Displaced fracture of base of neck of left femur, subsequent encounter for closed fracture with routine healing: Secondary | ICD-10-CM | POA: Diagnosis not present

## 2019-07-20 DIAGNOSIS — I10 Essential (primary) hypertension: Secondary | ICD-10-CM | POA: Diagnosis not present

## 2019-07-20 DIAGNOSIS — J302 Other seasonal allergic rhinitis: Secondary | ICD-10-CM | POA: Diagnosis not present

## 2019-07-20 DIAGNOSIS — I4892 Unspecified atrial flutter: Secondary | ICD-10-CM | POA: Diagnosis not present

## 2019-07-23 ENCOUNTER — Other Ambulatory Visit: Payer: Self-pay | Admitting: *Deleted

## 2019-07-23 ENCOUNTER — Encounter: Payer: Self-pay | Admitting: *Deleted

## 2019-07-23 ENCOUNTER — Inpatient Hospital Stay: Payer: Medicare HMO | Admitting: Family Medicine

## 2019-07-23 NOTE — Patient Outreach (Signed)
[facility]) Care Management [facility] CM Telephone Outreach, EMMI Red Alert notification/ General Discharge Post- SNF discharge day # 7  07/23/2019  Cody Howard 1935/01/26 [id number]  EMMI Red-Alert notification/ General Discharge EMMI call date/ day #: Saturday Jul 21, 2019; day # 4 Red-Alert reason(s): "No scheduled follow up"  Successful telephone outreach to Cody Howard, 85 y/o male referred to [facility] RN CM this morning by [facility] CMA after EMMI red-alert notification received, as above.  Patient has recent hospitalization April 21-29, 2021 for mechanical fall incurring (L) femoral neck fracture, with subsequent hemiarthroplasty.  Patient was discharged form hospital to SNF for rehabilitation and was subsequently discharged from SNF to home/ self-care with home health services in place.  Patient has history including, but not limited to, DM- type II controlled with most recent A1-C noted 6.6; CKD stage III; CHF; HLD/ HTN; A-Fib; and depression.  HIPAA/ identity verified with patient during phone call today; purpose of call and [facility] RN CM services discussed with patient; patient agrees to complete [facility] CM EMMI Red alert screening call.  Patient reports "doing fine" post- SNF discharge, but states his main problem is "being bored," as he recuperates.  He denies clinical concerns, issues/ problems and new/ recent falls and sounds to be in no apparent distress throughout phone call today.  He confirms that he attended post- SNF surgical provider office visit "last week" and states "no one ever told me I should schedule a PCP office visit."  Reports "got a real good report" from surgical provider post- SNF discharge.  Patient further confirms:  Medications: -- Has all medicationsand takes as prescribed;denies questions/ concerns around current medications.  -- self-manage medications using weekly pill blister packaging; wife occasionally assists to remind patient to take  medications. -- declines medication review today, stating "nothing has changed," and adds that he lives beside a nurse who works at the hospital who double-checked medications when he returned home from SNF; patient was encouraged to schedule post- SNF discharge PCP office visit so that medications may be reviewed; he states he will consider doing so  Home health [facility]) services: -- [facility] for PT, OT, RN in place; patient does not remember name of agency; states "going fine"  Provider appointments: -- As noted above, encouraged patient to consider scheduling PCP office visit post- recent SNF discharge  Safety/ Mobility/ Falls: -- denies new/ recent falls post- recent SNF discharge -- assistive devices: currently using walker; has cane as well -- general fall risks/ prevention education discussed with patient today; patient reports "steady on feet and making progress every day"  Social/ Community Resource needs: -- currently denies community resource needs, stating supportive family members, friends/ neighbors that assist with care needs if/ as indicated -- family provides transportation for patient to all provider appointments, errands, etc -- SDOH completed for depression/ transportation/ food insecurity: no concerns identified  Advanced Directive (AD) Planning:   --reports currently has exisisting Advanced Directives in place for HCPOA and Living Will; denies desire to make changes -- endorses self as full code  Self-health management of chronic disease states of CHF/ DM/ A-Fib: -- patient endorses all chronic conditions "well controlled" and denies unplanned hospital admissions; states he has "always done what doctor's tell him" and believes he is on overall good stable health -- reports recent fall "just an accident" and verbalizes good general understanding of fall prevention measures/ fall risks     Patient declines ongoing [facility] CM follow up, stating no needs; no  care coordination/  care management needs were identified as a result of today's screening call.    Patient denies further issues, concerns, or problems today.  I provided patient with my direct phone number, the main [facility] CM office phone number, and the [facility] CM 24-hour nurse advice phone number should he change his mind and wish to participate in [facility] CM program or should needs arise in the future.  Plan:  Will close [facility] CM EMMI program, and make patient inactive with [facility] CM; patient declines ongoing participation as well as care coordination/ care management needs and none of same were identified during [facility] CM EMMI screening call,  Will make patient's PCP aware of same.  Oneta Rack, RN, BSN, [employer] [facility] Care Management  [phone]

## 2019-07-24 DIAGNOSIS — R69 Illness, unspecified: Secondary | ICD-10-CM | POA: Diagnosis not present

## 2019-07-24 DIAGNOSIS — I4892 Unspecified atrial flutter: Secondary | ICD-10-CM | POA: Diagnosis not present

## 2019-07-24 DIAGNOSIS — E785 Hyperlipidemia, unspecified: Secondary | ICD-10-CM | POA: Diagnosis not present

## 2019-07-24 DIAGNOSIS — J302 Other seasonal allergic rhinitis: Secondary | ICD-10-CM | POA: Diagnosis not present

## 2019-07-24 DIAGNOSIS — S72042D Displaced fracture of base of neck of left femur, subsequent encounter for closed fracture with routine healing: Secondary | ICD-10-CM | POA: Diagnosis not present

## 2019-07-24 DIAGNOSIS — E1165 Type 2 diabetes mellitus with hyperglycemia: Secondary | ICD-10-CM | POA: Diagnosis not present

## 2019-07-24 DIAGNOSIS — I10 Essential (primary) hypertension: Secondary | ICD-10-CM | POA: Diagnosis not present

## 2019-07-24 DIAGNOSIS — K5900 Constipation, unspecified: Secondary | ICD-10-CM | POA: Diagnosis not present

## 2019-07-26 DIAGNOSIS — I10 Essential (primary) hypertension: Secondary | ICD-10-CM | POA: Diagnosis not present

## 2019-07-26 DIAGNOSIS — E785 Hyperlipidemia, unspecified: Secondary | ICD-10-CM | POA: Diagnosis not present

## 2019-07-26 DIAGNOSIS — E1165 Type 2 diabetes mellitus with hyperglycemia: Secondary | ICD-10-CM | POA: Diagnosis not present

## 2019-07-26 DIAGNOSIS — J302 Other seasonal allergic rhinitis: Secondary | ICD-10-CM | POA: Diagnosis not present

## 2019-07-26 DIAGNOSIS — S72042D Displaced fracture of base of neck of left femur, subsequent encounter for closed fracture with routine healing: Secondary | ICD-10-CM | POA: Diagnosis not present

## 2019-07-26 DIAGNOSIS — K5900 Constipation, unspecified: Secondary | ICD-10-CM | POA: Diagnosis not present

## 2019-07-26 DIAGNOSIS — I4892 Unspecified atrial flutter: Secondary | ICD-10-CM | POA: Diagnosis not present

## 2019-07-26 DIAGNOSIS — R69 Illness, unspecified: Secondary | ICD-10-CM | POA: Diagnosis not present

## 2019-07-27 DIAGNOSIS — S72042D Displaced fracture of base of neck of left femur, subsequent encounter for closed fracture with routine healing: Secondary | ICD-10-CM | POA: Diagnosis not present

## 2019-07-27 DIAGNOSIS — I10 Essential (primary) hypertension: Secondary | ICD-10-CM | POA: Diagnosis not present

## 2019-07-27 DIAGNOSIS — E1165 Type 2 diabetes mellitus with hyperglycemia: Secondary | ICD-10-CM | POA: Diagnosis not present

## 2019-07-27 DIAGNOSIS — R69 Illness, unspecified: Secondary | ICD-10-CM | POA: Diagnosis not present

## 2019-07-31 DIAGNOSIS — E785 Hyperlipidemia, unspecified: Secondary | ICD-10-CM | POA: Diagnosis not present

## 2019-07-31 DIAGNOSIS — E1165 Type 2 diabetes mellitus with hyperglycemia: Secondary | ICD-10-CM | POA: Diagnosis not present

## 2019-07-31 DIAGNOSIS — I10 Essential (primary) hypertension: Secondary | ICD-10-CM | POA: Diagnosis not present

## 2019-07-31 DIAGNOSIS — R69 Illness, unspecified: Secondary | ICD-10-CM | POA: Diagnosis not present

## 2019-07-31 DIAGNOSIS — K5900 Constipation, unspecified: Secondary | ICD-10-CM | POA: Diagnosis not present

## 2019-07-31 DIAGNOSIS — I4892 Unspecified atrial flutter: Secondary | ICD-10-CM | POA: Diagnosis not present

## 2019-07-31 DIAGNOSIS — J302 Other seasonal allergic rhinitis: Secondary | ICD-10-CM | POA: Diagnosis not present

## 2019-07-31 DIAGNOSIS — S72042D Displaced fracture of base of neck of left femur, subsequent encounter for closed fracture with routine healing: Secondary | ICD-10-CM | POA: Diagnosis not present

## 2019-08-02 ENCOUNTER — Other Ambulatory Visit: Payer: Self-pay | Admitting: Family Medicine

## 2019-08-02 DIAGNOSIS — I429 Cardiomyopathy, unspecified: Secondary | ICD-10-CM

## 2019-08-02 NOTE — Telephone Encounter (Signed)
Requested Prescriptions  Pending Prescriptions Disp Refills  . furosemide (LASIX) 20 MG tablet [Pharmacy Med Name: FUROSEMIDE 20 MG TABLET] 90 tablet 1    Sig: TAKE 1 TABLET BY MOUTH EVERY DAY     Cardiovascular:  Diuretics - Loop Failed - 08/02/2019  1:34 AM      Failed - Ca in normal range and within 360 days    Calcium  Date Value Ref Range Status  07/03/2019 8.6 (L) 8.9 - 10.3 mg/dL Final         Failed - Cr in normal range and within 360 days    Creatinine, Ser  Date Value Ref Range Status  07/03/2019 1.55 (H) 0.61 - 1.24 mg/dL Final         Failed - Last BP in normal range    BP Readings from Last 1 Encounters:  07/05/19 (!) 151/67         Passed - K in normal range and within 360 days    Potassium  Date Value Ref Range Status  07/03/2019 4.2 3.5 - 5.1 mmol/L Final         Passed - Na in normal range and within 360 days    Sodium  Date Value Ref Range Status  07/03/2019 137 135 - 145 mmol/L Final  05/31/2019 139 134 - 144 mmol/L Final         Passed - Valid encounter within last 6 months    Recent Outpatient Visits          2 months ago Malaise and fatigue   [facility] [name], MD   4 months ago Malaise and fatigue   [facility] [name], MD   5 months ago Malaise and fatigue   [facility] [name], MD   8 months ago Type 2 diabetes mellitus without complication, without long-term current use of insulin ([place])   [facility] [name], MD   1 year ago Type 2 diabetes mellitus without complication, without long-term current use of insulin [facility])   [facility] [name], MD

## 2019-08-08 DIAGNOSIS — E1165 Type 2 diabetes mellitus with hyperglycemia: Secondary | ICD-10-CM | POA: Diagnosis not present

## 2019-08-08 DIAGNOSIS — R69 Illness, unspecified: Secondary | ICD-10-CM | POA: Diagnosis not present

## 2019-08-08 DIAGNOSIS — S72042D Displaced fracture of base of neck of left femur, subsequent encounter for closed fracture with routine healing: Secondary | ICD-10-CM | POA: Diagnosis not present

## 2019-08-08 DIAGNOSIS — J302 Other seasonal allergic rhinitis: Secondary | ICD-10-CM | POA: Diagnosis not present

## 2019-08-08 DIAGNOSIS — K5900 Constipation, unspecified: Secondary | ICD-10-CM | POA: Diagnosis not present

## 2019-08-08 DIAGNOSIS — E785 Hyperlipidemia, unspecified: Secondary | ICD-10-CM | POA: Diagnosis not present

## 2019-08-08 DIAGNOSIS — I4892 Unspecified atrial flutter: Secondary | ICD-10-CM | POA: Diagnosis not present

## 2019-08-08 DIAGNOSIS — I10 Essential (primary) hypertension: Secondary | ICD-10-CM | POA: Diagnosis not present

## 2019-08-09 ENCOUNTER — Telehealth: Payer: Self-pay | Admitting: Family Medicine

## 2019-08-09 NOTE — Telephone Encounter (Signed)
I talked to ortho receptionist and she will speak to Dr Roland Rack about the Eliquis prescription being refilled. Pt has appt on June 16 @ 1:30 in [place] for follow up with Poggi. I have asked that Poggi refill the med until seeing him if he needs to stay on this med. Pt's wife is aware that she should be receiving a call back in 24-48 hrs per receptionist at [place] office.

## 2019-08-09 NOTE — Telephone Encounter (Signed)
Copied from [place] [id number]. Topic: General - Inquiry >> Aug 09, 2019 11:12 AM Scherrie Gerlach wrote: Reason for CRM: wife would like a call back to discuss medications the pt should be on.  Asked several times if I could inform the nurse exactly what it is pertaining to, she declined. Stating she would like Dr [name] nurse to call her back. And offered no other information.

## 2019-08-17 DIAGNOSIS — R69 Illness, unspecified: Secondary | ICD-10-CM | POA: Diagnosis not present

## 2019-08-17 DIAGNOSIS — E1165 Type 2 diabetes mellitus with hyperglycemia: Secondary | ICD-10-CM | POA: Diagnosis not present

## 2019-08-17 DIAGNOSIS — I4892 Unspecified atrial flutter: Secondary | ICD-10-CM | POA: Diagnosis not present

## 2019-08-17 DIAGNOSIS — K5900 Constipation, unspecified: Secondary | ICD-10-CM | POA: Diagnosis not present

## 2019-08-17 DIAGNOSIS — E785 Hyperlipidemia, unspecified: Secondary | ICD-10-CM | POA: Diagnosis not present

## 2019-08-17 DIAGNOSIS — J302 Other seasonal allergic rhinitis: Secondary | ICD-10-CM | POA: Diagnosis not present

## 2019-08-17 DIAGNOSIS — I10 Essential (primary) hypertension: Secondary | ICD-10-CM | POA: Diagnosis not present

## 2019-08-17 DIAGNOSIS — S72042D Displaced fracture of base of neck of left femur, subsequent encounter for closed fracture with routine healing: Secondary | ICD-10-CM | POA: Diagnosis not present

## 2019-08-22 DIAGNOSIS — S72032D Displaced midcervical fracture of left femur, subsequent encounter for closed fracture with routine healing: Secondary | ICD-10-CM | POA: Diagnosis not present

## 2019-08-22 DIAGNOSIS — Z96642 Presence of left artificial hip joint: Secondary | ICD-10-CM | POA: Diagnosis not present

## 2019-08-23 DIAGNOSIS — I10 Essential (primary) hypertension: Secondary | ICD-10-CM | POA: Diagnosis not present

## 2019-08-23 DIAGNOSIS — I4892 Unspecified atrial flutter: Secondary | ICD-10-CM | POA: Diagnosis not present

## 2019-08-23 DIAGNOSIS — J302 Other seasonal allergic rhinitis: Secondary | ICD-10-CM | POA: Diagnosis not present

## 2019-08-23 DIAGNOSIS — K5900 Constipation, unspecified: Secondary | ICD-10-CM | POA: Diagnosis not present

## 2019-08-23 DIAGNOSIS — R69 Illness, unspecified: Secondary | ICD-10-CM | POA: Diagnosis not present

## 2019-08-23 DIAGNOSIS — E1165 Type 2 diabetes mellitus with hyperglycemia: Secondary | ICD-10-CM | POA: Diagnosis not present

## 2019-08-23 DIAGNOSIS — S72042D Displaced fracture of base of neck of left femur, subsequent encounter for closed fracture with routine healing: Secondary | ICD-10-CM | POA: Diagnosis not present

## 2019-08-23 DIAGNOSIS — E785 Hyperlipidemia, unspecified: Secondary | ICD-10-CM | POA: Diagnosis not present

## 2019-08-28 DIAGNOSIS — K5900 Constipation, unspecified: Secondary | ICD-10-CM | POA: Diagnosis not present

## 2019-08-28 DIAGNOSIS — E1165 Type 2 diabetes mellitus with hyperglycemia: Secondary | ICD-10-CM | POA: Diagnosis not present

## 2019-08-28 DIAGNOSIS — E785 Hyperlipidemia, unspecified: Secondary | ICD-10-CM | POA: Diagnosis not present

## 2019-08-28 DIAGNOSIS — I4892 Unspecified atrial flutter: Secondary | ICD-10-CM | POA: Diagnosis not present

## 2019-08-28 DIAGNOSIS — I10 Essential (primary) hypertension: Secondary | ICD-10-CM | POA: Diagnosis not present

## 2019-08-28 DIAGNOSIS — S72042D Displaced fracture of base of neck of left femur, subsequent encounter for closed fracture with routine healing: Secondary | ICD-10-CM | POA: Diagnosis not present

## 2019-08-28 DIAGNOSIS — R69 Illness, unspecified: Secondary | ICD-10-CM | POA: Diagnosis not present

## 2019-08-28 DIAGNOSIS — J302 Other seasonal allergic rhinitis: Secondary | ICD-10-CM | POA: Diagnosis not present

## 2019-09-02 ENCOUNTER — Other Ambulatory Visit: Payer: Self-pay | Admitting: Family Medicine

## 2019-09-02 DIAGNOSIS — E119 Type 2 diabetes mellitus without complications: Secondary | ICD-10-CM

## 2019-09-02 NOTE — Telephone Encounter (Signed)
Requested medication (s) are due for refill today: yes  Requested medication (s) are on the active medication list: yes  Last refill:  03/12/19  Future visit scheduled: yes  Notes to clinic:  Lav work overdue (Hgb A1C)   Requested Prescriptions  Pending Prescriptions Disp Refills   metFORMIN (GLUCOPHAGE-XR) 750 MG 24 hr tablet [Pharmacy Med Name: METFORMIN HCL ER 750 MG TABLET] 90 tablet 1    Sig: TAKE 1 [place]      Endocrinology:  Diabetes - Biguanides Failed - 09/02/2019  9:32 AM      Failed - Cr in normal range and within 360 days    Creatinine, Ser  Date Value Ref Range Status  07/03/2019 1.55 (H) 0.61 - 1.24 mg/dL Final          Failed - HBA1C is between 0 and 7.9 and within 180 days    Hgb A1c MFr Bld  Date Value Ref Range Status  11/07/2018 6.6 (H) 4.8 - 5.6 % Final    Comment:             Prediabetes: 5.7 - 6.4          Diabetes: >6.4          Glycemic control for adults with diabetes: <7.0           Failed - eGFR in normal range and within 360 days    GFR calc Af Amer  Date Value Ref Range Status  07/03/2019 48 (L) >60 mL/min Final   GFR calc non Af Amer  Date Value Ref Range Status  07/03/2019 41 (L) >60 mL/min Final          Passed - Valid encounter within last 6 months    Recent Outpatient Visits           3 months ago Malaise and fatigue   [facility] [name], MD   5 months ago Malaise and fatigue   [facility] [name], MD   6 months ago Malaise and fatigue   [facility] [name], MD   9 months ago Type 2 diabetes mellitus without complication, without long-term current use of insulin ([place])   [facility] [name], MD   1 year ago Type 2 diabetes mellitus without complication, without long-term current use of insulin ([place])   [facility] [name], MD       Future Appointments             In 2 months [name], MD  [facility], [facility]

## 2019-09-11 ENCOUNTER — Other Ambulatory Visit: Payer: Self-pay | Admitting: Family Medicine

## 2019-09-11 DIAGNOSIS — R69 Illness, unspecified: Secondary | ICD-10-CM | POA: Diagnosis not present

## 2019-09-11 DIAGNOSIS — E119 Type 2 diabetes mellitus without complications: Secondary | ICD-10-CM

## 2019-09-11 NOTE — Telephone Encounter (Signed)
Requested medication (s) are due for refill today:  Yes  Requested medication (s) are on the active medication list:  No}  Future visit scheduled:  Yes  Last Refill: it was d/c'd on 06/27/19  Note to Clinic:  Per pharmacy: " PATIENT METER IS NO LONGER WORKING, OT IS REQUESTING RX FOR METER, TEST STRIPS AND LANCETS PLEASE"   Requested Prescriptions  Pending Prescriptions Disp Refills   ONETOUCH ULTRA test strip [Pharmacy Med Name: [place] TEST STRP] 100 strip 0    Sig: USE TO TEST ONCE A DAY      Endocrinology: Diabetes - Testing Supplies Passed - 09/11/2019  1:01 PM      Passed - Valid encounter within last 12 months    Recent Outpatient Visits           3 months ago Malaise and fatigue   [name], [facility]   6 months ago Malaise and fatigue   [facility] [name], MD   6 months ago Malaise and fatigue   [facility] [name], MD   10 months ago Type 2 diabetes mellitus without complication, without long-term current use of insulin ([place])   [facility] [name], MD   1 year ago Type 2 diabetes mellitus without complication, without long-term current use of insulin ([place])   [facility] [name], MD       Future Appointments             In 2 months [name], MD [facility], [facility]

## 2019-09-12 ENCOUNTER — Telehealth: Payer: Self-pay | Admitting: Family Medicine

## 2019-09-12 DIAGNOSIS — G459 Transient cerebral ischemic attack, unspecified: Secondary | ICD-10-CM | POA: Diagnosis not present

## 2019-09-12 DIAGNOSIS — I5022 Chronic systolic (congestive) heart failure: Secondary | ICD-10-CM | POA: Diagnosis not present

## 2019-09-12 DIAGNOSIS — I340 Nonrheumatic mitral (valve) insufficiency: Secondary | ICD-10-CM | POA: Diagnosis not present

## 2019-09-12 DIAGNOSIS — E782 Mixed hyperlipidemia: Secondary | ICD-10-CM | POA: Diagnosis not present

## 2019-09-12 DIAGNOSIS — I4892 Unspecified atrial flutter: Secondary | ICD-10-CM | POA: Diagnosis not present

## 2019-09-12 DIAGNOSIS — R55 Syncope and collapse: Secondary | ICD-10-CM | POA: Diagnosis not present

## 2019-09-12 DIAGNOSIS — I420 Dilated cardiomyopathy: Secondary | ICD-10-CM | POA: Diagnosis not present

## 2019-09-12 DIAGNOSIS — R69 Illness, unspecified: Secondary | ICD-10-CM | POA: Diagnosis not present

## 2019-09-12 NOTE — Telephone Encounter (Unsigned)
Copied from [place] [id number]. Topic: General - Other >> Sep 12, 2019  1:21 PM Celene Kras wrote: Reason for CRM: Pts wife called and is requesting to speak with PCP regarding the pt receiving a diabetic meter. Please advise.

## 2019-09-13 DIAGNOSIS — Z96642 Presence of left artificial hip joint: Secondary | ICD-10-CM | POA: Diagnosis not present

## 2019-09-13 DIAGNOSIS — M7062 Trochanteric bursitis, left hip: Secondary | ICD-10-CM | POA: Diagnosis not present

## 2019-09-13 NOTE — Telephone Encounter (Signed)
Done- talked to pharm yesterday

## 2019-10-03 ENCOUNTER — Telehealth: Payer: Self-pay

## 2019-10-03 ENCOUNTER — Telehealth: Payer: Self-pay | Admitting: Family Medicine

## 2019-10-03 NOTE — Telephone Encounter (Signed)
Copied from [place] [phone]. Topic: General - Inquiry >> Oct 03, 2019  9:53 AM Mathis Bud wrote: Reason for CRM: Patient wife called requesting to speak with PCP or PCP nurse. Patient wife did not want to disclose of what was going on, only wanted to speak with nurse or pcp Call back [phone]

## 2019-10-03 NOTE — Telephone Encounter (Signed)
Spoke to wife on phone

## 2019-10-03 NOTE — Telephone Encounter (Signed)
Pt's wife called concerning pt has fever, chills and nausea. Pt is s/p hip surgery. He has been advised to see if Dr Roland Rack thinks it is related, if not it is best to go to hospital or urgent care for further eval

## 2019-10-04 ENCOUNTER — Ambulatory Visit (INDEPENDENT_AMBULATORY_CARE_PROVIDER_SITE_OTHER): Payer: Medicare HMO | Admitting: Family Medicine

## 2019-10-04 ENCOUNTER — Other Ambulatory Visit: Payer: Self-pay

## 2019-10-04 ENCOUNTER — Encounter: Payer: Self-pay | Admitting: Family Medicine

## 2019-10-04 VITALS — BP 124/68 | HR 74 | Temp 98.0°F | Ht 71.0 in | Wt 178.0 lb

## 2019-10-04 DIAGNOSIS — E1169 Type 2 diabetes mellitus with other specified complication: Secondary | ICD-10-CM

## 2019-10-04 DIAGNOSIS — E785 Hyperlipidemia, unspecified: Secondary | ICD-10-CM | POA: Diagnosis not present

## 2019-10-04 DIAGNOSIS — Z862 Personal history of diseases of the blood and blood-forming organs and certain disorders involving the immune mechanism: Secondary | ICD-10-CM

## 2019-10-04 DIAGNOSIS — E119 Type 2 diabetes mellitus without complications: Secondary | ICD-10-CM | POA: Diagnosis not present

## 2019-10-04 DIAGNOSIS — N1832 Chronic kidney disease, stage 3b: Secondary | ICD-10-CM

## 2019-10-04 MED ORDER — METFORMIN HCL ER 750 MG PO TB24: ORAL_TABLET | ORAL | 1 refills | Status: DC

## 2019-10-04 NOTE — Progress Notes (Signed)
Date:  10/04/2019   Name:  Cody Howard   DOB:  1935-03-01   MRN:  [id number]   Chief Complaint: Diabetes (needs A1C)  Diabetes He presents for his follow-up diabetic visit. He has type 2 diabetes mellitus. His disease course has been stable. There are no hypoglycemic associated symptoms. Pertinent negatives for hypoglycemia include no confusion, dizziness, headaches, nervousness/anxiousness or pallor. There are no diabetic associated symptoms. Pertinent negatives for diabetes include no chest pain and no polydipsia. There are no hypoglycemic complications. Symptoms are stable. There are no diabetic complications. Risk factors for coronary artery disease include diabetes mellitus. Current diabetic treatment includes oral agent (monotherapy). His weight is fluctuating minimally. He is following a generally healthy diet. His home blood glucose trend is fluctuating minimally. His breakfast blood glucose range is generally 140-180 mg/dl. An ACE inhibitor/angiotensin II receptor blocker is being taken.  Anemia Presents for follow-up visit. There has been no abdominal pain, anorexia, bruising/bleeding easily, confusion, fever, pallor, palpitations or paresthesias. Signs of blood loss that are not present include hematemesis, hematochezia and melena. There is no history of chronic renal disease or hypothyroidism.  Hyperlipidemia This is a chronic problem. The current episode started more than 1 year ago. The problem is controlled. Recent lipid tests were reviewed and are normal. He has no history of chronic renal disease, diabetes, hypothyroidism, liver disease, obesity or nephrotic syndrome. Pertinent negatives include no chest pain, focal sensory loss, focal weakness, leg pain, myalgias or shortness of breath. Current antihyperlipidemic treatment includes statins. The current treatment provides moderate improvement of lipids. There are no compliance problems.     Lab Results  Component Value Date     CREATININE 1.55 (H) 07/03/2019   BUN 35 (H) 07/03/2019   NA 137 07/03/2019   K 4.2 07/03/2019   CL 104 07/03/2019   CO2 23 07/03/2019   Lab Results  Component Value Date   CHOL 257 (H) 03/12/2019   HDL 59 03/12/2019   LDLCALC 167 (H) 03/12/2019   TRIG 172 (H) 03/12/2019   CHOLHDL 2.3 11/29/2016   Lab Results  Component Value Date   TSH 2.710 05/31/2019   Lab Results  Component Value Date   HGBA1C 6.6 (H) 11/07/2018   Lab Results  Component Value Date   WBC 11.9 (H) 07/03/2019   HGB 11.3 (L) 07/03/2019   HCT 31.8 (L) 07/03/2019   MCV 94.9 07/03/2019   PLT 175 07/03/2019   Lab Results  Component Value Date   ALT 15 01/01/2019   AST 21 01/01/2019   ALKPHOS 82 01/01/2019   BILITOT 0.7 01/01/2019     Review of Systems  Constitutional: Negative for chills and fever.  HENT: Negative for drooling, ear discharge, ear pain and sore throat.   Respiratory: Negative for cough, shortness of breath and wheezing.   Cardiovascular: Negative for chest pain, palpitations and leg swelling.  Gastrointestinal: Negative for abdominal pain, anorexia, blood in stool, constipation, diarrhea, hematemesis, hematochezia, melena and nausea.  Endocrine: Negative for polydipsia.  Genitourinary: Negative for dysuria, frequency, hematuria and urgency.  Musculoskeletal: Negative for back pain, myalgias and neck pain.  Skin: Negative for pallor and rash.  Allergic/Immunologic: Negative for environmental allergies.  Neurological: Negative for dizziness, focal weakness, headaches and paresthesias.  Hematological: Does not bruise/bleed easily.  Psychiatric/Behavioral: Negative for confusion and suicidal ideas. The patient is not nervous/anxious.     Patient Active Problem List   Diagnosis Date Noted  . Atrial fibrillation, chronic ([place]) 06/27/2019  .  Depression 06/27/2019  . Fall at home, initial encounter 06/27/2019  . Closed displaced fracture of left femoral neck ([place]) 06/27/2019  .  History of anemia 05/31/2019  . Benign prostatic hyperplasia with lower urinary tract symptoms 05/31/2019  . Cerebral atrophy, mild ([place]) 05/31/2019  . Reactive depression 05/31/2019  . Essential hypertension 05/31/2019  . Hiatal hernia 05/31/2019  . Dizziness 04/12/2018  . TIA (transient ischemic attack) 01/25/2018  . Chronic systolic CHF (congestive heart failure), NYHA class 3 ([place]) 11/29/2017  . CKD (chronic kidney disease) stage 3, GFR 30-59 ml/min 08/18/2017  . Bradycardia 08/18/2017  . Atrial flutter, paroxysmal ([place]) 07/21/2017  . Functional dyspnea 07/06/2017  . Cardiomyopathy ([place]) 07/06/2017  . Hyperlipidemia, mixed 06/20/2017  . Hyperlipidemia associated with type 2 diabetes mellitus ([place]) 04/06/2016  . Type II diabetes mellitus with renal manifestations ([place]) 02/10/2015  . Personal history of other diseases of male genital organs 02/10/2015  . Sebaceous cyst 08/28/2014    No Known Allergies  Past Surgical History:  Procedure Laterality Date  . COLONOSCOPY N/A 02/26/2015   Procedure: COLONOSCOPY;  Surgeon: Hulen Luster, MD;  Location: [facility];  Service: Gastroenterology;  Laterality: N/A;  . COLONOSCOPY WITH PROPOFOL N/A 11/02/2017   Procedure: COLONOSCOPY WITH PROPOFOL;  Surgeon: Toledo, Benay Pike, MD;  Location: [facility];  Service: Gastroenterology;  Laterality: N/A;  . ESOPHAGOGASTRODUODENOSCOPY N/A 02/26/2015   Procedure: ESOPHAGOGASTRODUODENOSCOPY (EGD);  Surgeon: Hulen Luster, MD;  Location: [facility];  Service: Gastroenterology;  Laterality: N/A;  Diabetic - oral meds  . ESOPHAGOGASTRODUODENOSCOPY (EGD) WITH PROPOFOL N/A 11/02/2017   Procedure: ESOPHAGOGASTRODUODENOSCOPY (EGD) WITH PROPOFOL;  Surgeon: Toledo, Benay Pike, MD;  Location: [facility];  Service: Gastroenterology;  Laterality: N/A;  . HIP ARTHROPLASTY Left 06/28/2019   Procedure: ARTHROPLASTY BIPOLAR HIP (HEMIARTHROPLASTY);  Surgeon: Corky Mull, MD;  Location: [facility];   Service: Orthopedics;  Laterality: Left;  . KIDNEY SURGERY Right    surgery when 85 years old  . POLYPECTOMY  02/26/2015   Procedure: POLYPECTOMY;  Surgeon: Hulen Luster, MD;  Location: [facility];  Service: Gastroenterology;;  . sebaceous cyst removal Right    located right of spine on upper back    Social History   Tobacco Use  . Smoking status: Former Smoker    Quit date: 03/08/1978    Years since quitting: 41.6  . Smokeless tobacco: Never Used  Vaping Use  . Vaping Use: Never used  Substance Use Topics  . Alcohol use: Yes    Alcohol/week: 0.0 standard drinks    Comment: Rarely  . Drug use: No     Medication list has been reviewed and updated.  Current Meds  Medication Sig  . apixaban (ELIQUIS) 2.5 MG TABS tablet Take 1 tablet (2.5 mg total) by mouth 2 (two) times daily.  . carvedilol (COREG) 3.125 MG tablet Take 1 tablet (3.125 mg total) by mouth 2 (two) times daily with a meal.  . diphenhydrAMINE (BENADRYL) 50 MG capsule Take 50 mg by mouth at bedtime as needed for allergies or sleep.   Marland Kitchen donepezil (ARICEPT) 10 MG tablet Take 1 tablet (10 mg total) by mouth daily. Chipper Herb  . ferrous sulfate 325 (65 FE) MG tablet Take 325 mg by mouth daily with breakfast.  . furosemide (LASIX) 20 MG tablet TAKE 1 TABLET BY MOUTH EVERY DAY  . losartan (COZAAR) 100 MG tablet TAKE 1/2 TABLET BY MOUTH DAILY  . metFORMIN (GLUCOPHAGE-XR) 750 MG 24 hr tablet TAKE 1 TABLET BY  MOUTH EVERY DAY WITH BREAKFAST  . senna-docusate (SENOKOT-S) 8.6-50 MG tablet Take 1 tablet by mouth at bedtime as needed for mild constipation.  . sertraline (ZOLOFT) 25 MG tablet Take 25 mg by mouth daily.  . simvastatin (ZOCOR) 80 MG tablet TAKE 1 TABLET BY MOUTH EVERY EVENING  . traMADol (ULTRAM) 50 MG tablet Take 1 tablet (50 mg total) by mouth every 6 (six) hours as needed for moderate pain.    PHQ 2/9 Scores 07/23/2019 05/31/2019 05/09/2018 11/29/2016  PHQ - 2 Score 0 2 0 0  PHQ- 9 Score - 4 0 0    GAD 7 :  Generalized Anxiety Score 05/31/2019  Nervous, Anxious, on Edge 0  Control/stop worrying 0  Worry too much - different things 0  Trouble relaxing 0  Restless 1  Easily annoyed or irritable 1  Afraid - awful might happen 0  Total GAD 7 Score 2  Anxiety Difficulty Not difficult at all    BP Readings from Last 3 Encounters:  10/04/19 124/68  07/05/19 (!) 151/67  06/16/19 120/73    Physical Exam Vitals and nursing note reviewed.  HENT:     Head: Normocephalic.     Right Ear: Tympanic membrane, ear canal and external ear normal.     Left Ear: Tympanic membrane, ear canal and external ear normal.     Nose: Nose normal. No congestion or rhinorrhea.     Mouth/Throat:     Mouth: Mucous membranes are moist.  Eyes:     General: No scleral icterus.       Right eye: No discharge.        Left eye: No discharge.     Conjunctiva/sclera: Conjunctivae normal.     Pupils: Pupils are equal, round, and reactive to light.  Neck:     Thyroid: No thyromegaly.     Vascular: No JVD.     Trachea: No tracheal deviation.  Cardiovascular:     Rate and Rhythm: Normal rate and regular rhythm.     Pulses:          Dorsalis pedis pulses are 1+ on the right side and 1+ on the left side.       Posterior tibial pulses are 1+ on the right side and 1+ on the left side.     Heart sounds: Normal heart sounds. No murmur heard.  No friction rub. No gallop.   Pulmonary:     Effort: No respiratory distress.     Breath sounds: Normal breath sounds. No wheezing, rhonchi or rales.  Abdominal:     General: Bowel sounds are normal.     Palpations: Abdomen is soft. There is no mass.     Tenderness: There is no abdominal tenderness. There is no guarding or rebound.  Musculoskeletal:        General: No tenderness. Normal range of motion.     Cervical back: Normal range of motion and neck supple.     Right foot: Normal range of motion.     Left foot: Normal range of motion.  Feet:     Right foot:     Protective  Sensation: 10 sites tested. 10 sites sensed.     Skin integrity: Skin integrity normal. No ulcer, blister, skin breakdown, erythema, warmth, callus, dry skin or fissure.     Toenail Condition: Right toenails are normal.     Left foot:     Protective Sensation: 10 sites tested. 10 sites sensed.     Skin integrity:  Skin integrity normal. No ulcer, blister, skin breakdown, erythema, warmth, callus, dry skin or fissure.     Toenail Condition: Left toenails are normal.  Lymphadenopathy:     Cervical: No cervical adenopathy.  Skin:    General: Skin is warm.     Capillary Refill: Capillary refill takes less than 2 seconds.     Findings: No rash.  Neurological:     Mental Status: He is alert and oriented to person, place, and time.     Cranial Nerves: No cranial nerve deficit.     Deep Tendon Reflexes: Reflexes are normal and symmetric.     Wt Readings from Last 3 Encounters:  10/04/19 178 lb (80.7 kg)  06/27/19 195 lb (88.5 kg)  06/16/19 190 lb (86.2 kg)    BP 124/68   Pulse 74   Temp 98 F (36.7 C) (Oral)   Ht 5\' 11"  (1.803 m)   Wt 178 lb (80.7 kg)   SpO2 98%   BMI 24.83 kg/m   Assessment and Plan: 1. Type 2 diabetes mellitus without complication, without long-term current use of insulin (HCC) Chronic.  Relatively controlled.  Patient currently on Metformin 750 mg once a day.  We will check A1c and renal function panel for GFR and glucose.  Have discussed with patient the importance of control of this and that we may be having to add a sulfonylurea seen that most of his fasting glucoses have been in the 1 50-1 70 range. - metFORMIN (GLUCOPHAGE-XR) 750 MG 24 hr tablet; TAKE 1 TABLET BY MOUTH EVERY DAY WITH BREAKFAST  Dispense: 90 tablet; Refill: 1 - Hemoglobin A1c - Renal Function Panel  2. History of anemia Chronic.  Controlled.  Stable.  On review of previous patient is relatively stable with his anemia and we will recheck a CBC and platelet count present further  surveillance. - CBC with Differential/Platelet  3. Stage 3b chronic kidney disease Chronic.  Controlled.  Stable.  We will check renal function panel for GFR and creatinine. - Renal Function Panel  4. Hyperlipidemia associated with type 2 diabetes mellitus (HCC) Chronic.  Controlled.  Stable.  Given the importance of controlling his diabetes it is also equally important to control cholesterol below 99 and we will check a lipid panel for evaluation and treatment possibilities. - Lipid Panel With LDL/HDL Ratio

## 2019-11-09 DIAGNOSIS — Z96642 Presence of left artificial hip joint: Secondary | ICD-10-CM | POA: Diagnosis not present

## 2019-11-15 ENCOUNTER — Telehealth: Payer: Self-pay

## 2019-11-15 NOTE — Chronic Care Management (AMB) (Signed)
Chronic Care Management   Note  11/15/2019 Name: Cody Howard MRN: [id number] DOB: Jan 25, 1935  Cody Howard is a 85 y.o. year old male who is a primary care patient of [name], MD. I reached out to [name] by phone today in response to a referral sent by Cody Howard's health plan.     Cody Howard was given information about Chronic Care Management services today including:  1. CCM service includes personalized support from designated clinical staff supervised by his physician, including individualized plan of care and coordination with other care providers 2. 24/7 contact phone numbers for assistance for urgent and routine care needs. 3. Service will only be billed when office clinical staff spend 20 minutes or more in a month to coordinate care. 4. Only one practitioner may furnish and bill the service in a calendar month. 5. The patient may stop CCM services at any time (effective at the end of the month) by phone call to the office staff. 6. The patient will be responsible for cost sharing (co-pay) of up to 20% of the service fee (after annual deductible is met).  Patient did not agree to enrollment in care management services and does not wish to consider at this time.  Follow up plan: The patient has been provided with contact information for the care management team and has been advised to call with any health related questions or concerns.   Noreene Larsson, [place], [place], [place] [address] Direct Dial: [phone] [email or web address] Website: [email or web address]

## 2019-11-20 ENCOUNTER — Ambulatory Visit: Payer: Self-pay | Admitting: Family Medicine

## 2019-11-20 DIAGNOSIS — H524 Presbyopia: Secondary | ICD-10-CM | POA: Diagnosis not present

## 2019-11-20 DIAGNOSIS — H5203 Hypermetropia, bilateral: Secondary | ICD-10-CM | POA: Diagnosis not present

## 2019-11-20 DIAGNOSIS — Z7984 Long term (current) use of oral hypoglycemic drugs: Secondary | ICD-10-CM | POA: Diagnosis not present

## 2019-11-20 DIAGNOSIS — Z961 Presence of intraocular lens: Secondary | ICD-10-CM | POA: Diagnosis not present

## 2019-11-20 DIAGNOSIS — H43813 Vitreous degeneration, bilateral: Secondary | ICD-10-CM | POA: Diagnosis not present

## 2019-11-20 DIAGNOSIS — H52223 Regular astigmatism, bilateral: Secondary | ICD-10-CM | POA: Diagnosis not present

## 2019-11-20 DIAGNOSIS — E119 Type 2 diabetes mellitus without complications: Secondary | ICD-10-CM | POA: Diagnosis not present

## 2019-11-20 LAB — HM DIABETES EYE EXAM

## 2019-11-21 ENCOUNTER — Ambulatory Visit: Payer: Medicare HMO

## 2019-11-21 DIAGNOSIS — L570 Actinic keratosis: Secondary | ICD-10-CM | POA: Diagnosis not present

## 2019-11-21 DIAGNOSIS — D485 Neoplasm of uncertain behavior of skin: Secondary | ICD-10-CM | POA: Diagnosis not present

## 2019-11-21 DIAGNOSIS — L218 Other seborrheic dermatitis: Secondary | ICD-10-CM | POA: Diagnosis not present

## 2019-11-21 DIAGNOSIS — X32XXXA Exposure to sunlight, initial encounter: Secondary | ICD-10-CM | POA: Diagnosis not present

## 2019-11-21 DIAGNOSIS — L821 Other seborrheic keratosis: Secondary | ICD-10-CM | POA: Diagnosis not present

## 2019-11-21 DIAGNOSIS — L814 Other melanin hyperpigmentation: Secondary | ICD-10-CM | POA: Diagnosis not present

## 2019-11-21 DIAGNOSIS — Z7189 Other specified counseling: Secondary | ICD-10-CM | POA: Diagnosis not present

## 2019-11-21 DIAGNOSIS — D235 Other benign neoplasm of skin of trunk: Secondary | ICD-10-CM | POA: Diagnosis not present

## 2019-11-21 DIAGNOSIS — D2372 Other benign neoplasm of skin of left lower limb, including hip: Secondary | ICD-10-CM | POA: Diagnosis not present

## 2019-11-21 DIAGNOSIS — D692 Other nonthrombocytopenic purpura: Secondary | ICD-10-CM | POA: Diagnosis not present

## 2019-11-21 DIAGNOSIS — D0439 Carcinoma in situ of skin of other parts of face: Secondary | ICD-10-CM | POA: Diagnosis not present

## 2019-11-21 DIAGNOSIS — D1801 Hemangioma of skin and subcutaneous tissue: Secondary | ICD-10-CM | POA: Diagnosis not present

## 2019-11-23 NOTE — Progress Notes (Unsigned)
Eye exam entered

## 2019-12-04 ENCOUNTER — Other Ambulatory Visit: Payer: Self-pay | Admitting: Family Medicine

## 2019-12-04 DIAGNOSIS — R69 Illness, unspecified: Secondary | ICD-10-CM | POA: Diagnosis not present

## 2019-12-04 DIAGNOSIS — E119 Type 2 diabetes mellitus without complications: Secondary | ICD-10-CM

## 2019-12-04 NOTE — Telephone Encounter (Signed)
Requested medications are due for refill today?  Yes  Requested medications are on active medication list?  Yes  Last Refill:   09/11/2019  # 100 with no refills   Future visit scheduled?  Yes  Notes to Clinic:    Please see below which was reported when patient was seen by provider on 10/04/2019.  Unclear if patient was provided with script for new meter, test strip and lancets for new meter.  Please review.  Thank you.    Obtained from medication list.        Order Details Dose, Route, Frequency: As Directed Dispense Quantity: 100 strip Refill 0 Note to Pharmacy: PATIENT METER IS NO LONGER WORKING, OT IS REQUESTING RX FOR METER, TEST STRIPS AND LANCETS PLEASE- ok to give Dx E11.9- test 1 time per day fasting/ box of 100 lancets   Sig: USE TO TEST ONCE A DAY Patient not taking: Reported on 10/04/2019   Start Date: 09/11/19 End Date: -- Written Date: 09/11/19 Expiration

## 2019-12-10 DIAGNOSIS — L570 Actinic keratosis: Secondary | ICD-10-CM | POA: Diagnosis not present

## 2019-12-10 DIAGNOSIS — D0439 Carcinoma in situ of skin of other parts of face: Secondary | ICD-10-CM | POA: Diagnosis not present

## 2019-12-14 ENCOUNTER — Other Ambulatory Visit: Payer: Self-pay

## 2019-12-14 ENCOUNTER — Other Ambulatory Visit: Payer: Self-pay | Admitting: Family Medicine

## 2019-12-14 NOTE — Telephone Encounter (Signed)
Requested medication (s) are due for refill today:unsure  Requested medication (s) are on the active medication list: No  Last refill:  not on any list  Future visit scheduled: yes  Notes to clinic:  not sure but I can't find this on med list. Need order    Requested Prescriptions  Pending Prescriptions Disp Refills   Lancets (ONETOUCH DELICA PLUS [id number]) [place] [Pharmacy Med Name: ONE TOUCH DELICA PLUS 10Y LANC] 111 each     Sig: USE TO TEST ONCE DAILY      Endocrinology: Diabetes - Testing Supplies Passed - 12/14/2019  2:11 PM      Passed - Valid encounter within last 12 months    Recent Outpatient Visits           2 months ago Type 2 diabetes mellitus without complication, without long-term current use of insulin ([place])   [facility] [name], MD   6 months ago Malaise and fatigue   [facility] [name], MD   9 months ago Malaise and fatigue   [facility] [name], MD   9 months ago Malaise and fatigue   [facility] [name], MD   1 year ago Type 2 diabetes mellitus without complication, without long-term current use of insulin ([place])   [facility] [name], MD       Future Appointments             In 2 months [name], MD [facility]

## 2020-01-11 ENCOUNTER — Encounter: Payer: Self-pay | Admitting: Family Medicine

## 2020-01-11 ENCOUNTER — Ambulatory Visit: Payer: Self-pay | Admitting: *Deleted

## 2020-01-11 ENCOUNTER — Ambulatory Visit (INDEPENDENT_AMBULATORY_CARE_PROVIDER_SITE_OTHER): Payer: Medicare HMO | Admitting: Family Medicine

## 2020-01-11 ENCOUNTER — Other Ambulatory Visit: Payer: Self-pay

## 2020-01-11 VITALS — BP 120/62 | HR 80 | Ht 71.0 in | Wt 185.0 lb

## 2020-01-11 DIAGNOSIS — I429 Cardiomyopathy, unspecified: Secondary | ICD-10-CM | POA: Diagnosis not present

## 2020-01-11 DIAGNOSIS — N1832 Chronic kidney disease, stage 3b: Secondary | ICD-10-CM | POA: Diagnosis not present

## 2020-01-11 DIAGNOSIS — E119 Type 2 diabetes mellitus without complications: Secondary | ICD-10-CM | POA: Diagnosis not present

## 2020-01-11 DIAGNOSIS — H6121 Impacted cerumen, right ear: Secondary | ICD-10-CM

## 2020-01-11 DIAGNOSIS — R69 Illness, unspecified: Secondary | ICD-10-CM | POA: Diagnosis not present

## 2020-01-11 MED ORDER — GLIPIZIDE 5 MG PO TABS: 5.0000 mg | ORAL_TABLET | Freq: Two times a day (BID) | ORAL | 1 refills | Status: DC

## 2020-01-11 MED ORDER — FUROSEMIDE 20 MG PO TABS: 20.0000 mg | ORAL_TABLET | Freq: Every day | ORAL | 1 refills | Status: DC

## 2020-01-11 MED ORDER — CARBAMIDE PEROXIDE 6.5 % OT SOLN: 5.0000 [drp] | Freq: Two times a day (BID) | OTIC | 0 refills | Status: DC

## 2020-01-11 NOTE — Telephone Encounter (Signed)
noted 

## 2020-01-11 NOTE — Patient Instructions (Signed)
Carbohydrate Counting for Diabetes Mellitus, Adult  Carbohydrate counting is a method of keeping track of how many carbohydrates you eat. Eating carbohydrates naturally increases the amount of sugar (glucose) in the blood. Counting how many carbohydrates you eat helps keep your blood glucose within normal limits, which helps you manage your diabetes (diabetes mellitus). It is important to know how many carbohydrates you can safely have in each meal. This is different for every person. A diet and nutrition specialist (registered dietitian) can help you make a meal plan and calculate how many carbohydrates you should have at each meal and snack. Carbohydrates are found in the following foods:  Grains, such as breads and cereals.  Dried beans and soy products.  Starchy vegetables, such as potatoes, peas, and corn.  Fruit and fruit juices.  Milk and yogurt.  Sweets and snack foods, such as cake, cookies, candy, chips, and soft drinks. How do I count carbohydrates? There are two ways to count carbohydrates in food. You can use either of the methods or a combination of both. Reading "Nutrition Facts" on packaged food The "Nutrition Facts" list is included on the labels of almost all packaged foods and beverages in the [place] It includes:  The serving size.  Information about nutrients in each serving, including the grams (g) of carbohydrate per serving. To use the "Nutrition Facts":  Decide how many servings you will have.  Multiply the number of servings by the number of carbohydrates per serving.  The resulting number is the total amount of carbohydrates that you will be having. Learning standard serving sizes of other foods When you eat carbohydrate foods that are not packaged or do not include "Nutrition Facts" on the label, you need to measure the servings in order to count the amount of carbohydrates:  Measure the foods that you will eat with a food scale or measuring cup, if  needed.  Decide how many standard-size servings you will eat.  Multiply the number of servings by 15. Most carbohydrate-rich foods have about 15 g of carbohydrates per serving. ? For example, if you eat 8 oz (170 g) of strawberries, you will have eaten 2 servings and 30 g of carbohydrates (2 servings x 15 g = 30 g).  For foods that have more than one food mixed, such as soups and casseroles, you must count the carbohydrates in each food that is included. The following list contains standard serving sizes of common carbohydrate-rich foods. Each of these servings has about 15 g of carbohydrates:   hamburger bun or  English muffin.   oz (15 mL) syrup.   oz (14 g) jelly.  1 slice of bread.  1 six-inch tortilla.  3 oz (85 g) cooked rice or pasta.  4 oz (113 g) cooked dried beans.  4 oz (113 g) starchy vegetable, such as peas, corn, or potatoes.  4 oz (113 g) hot cereal.  4 oz (113 g) mashed potatoes or  of a large baked potato.  4 oz (113 g) canned or frozen fruit.  4 oz (120 mL) fruit juice.  4-6 crackers.  6 chicken nuggets.  6 oz (170 g) unsweetened dry cereal.  6 oz (170 g) plain fat-free yogurt or yogurt sweetened with artificial sweeteners.  8 oz (240 mL) milk.  8 oz (170 g) fresh fruit or one small piece of fruit.  24 oz (680 g) popped popcorn. Example of carbohydrate counting Sample meal  3 oz (85 g) chicken breast.  6 oz (170 g)   brown rice.  4 oz (113 g) corn.  8 oz (240 mL) milk.  8 oz (170 g) strawberries with sugar-free whipped topping. Carbohydrate calculation 1. Identify the foods that contain carbohydrates: ? Rice. ? Corn. ? Milk. ? Strawberries. 2. Calculate how many servings you have of each food: ? 2 servings rice. ? 1 serving corn. ? 1 serving milk. ? 1 serving strawberries. 3. Multiply each number of servings by 15 g: ? 2 servings rice x 15 g = 30 g. ? 1 serving corn x 15 g = 15 g. ? 1 serving milk x 15 g = 15 g. ? 1  serving strawberries x 15 g = 15 g. 4. Add together all of the amounts to find the total grams of carbohydrates eaten: ? 30 g + 15 g + 15 g + 15 g = 75 g of carbohydrates total. Summary  Carbohydrate counting is a method of keeping track of how many carbohydrates you eat.  Eating carbohydrates naturally increases the amount of sugar (glucose) in the blood.  Counting how many carbohydrates you eat helps keep your blood glucose within normal limits, which helps you manage your diabetes.  A diet and nutrition specialist (registered dietitian) can help you make a meal plan and calculate how many carbohydrates you should have at each meal and snack. This information is not intended to replace advice given to you by your health care provider. Make sure you discuss any questions you have with your health care provider. Document Revised: 09/16/2016 Document Reviewed: 08/06/2015 Elsevier Patient Education  2020 [employer]  

## 2020-01-11 NOTE — Progress Notes (Signed)
Date:  01/11/2020   Name:  Cody Howard   DOB:  02/01/1935   MRN:  [id number]   Chief Complaint: Diabetes (foot exam)  Diabetes He presents for his follow-up diabetic visit. He has type 2 diabetes mellitus. His disease course has been stable. There are no hypoglycemic associated symptoms. Pertinent negatives for hypoglycemia include no dizziness, headaches or nervousness/anxiousness. Pertinent negatives for diabetes include no blurred vision, no chest pain, no fatigue, no foot paresthesias, no foot ulcerations, no polydipsia, no polyphagia, no polyuria, no visual change, no weakness and no weight loss. There are no hypoglycemic complications. Symptoms are stable. There are no diabetic complications. Risk factors for coronary artery disease include diabetes mellitus. Current diabetic treatment includes oral agent (monotherapy). He is compliant with treatment most of the time. He is following a generally healthy diet. Meal planning includes avoidance of concentrated sweets and carbohydrate counting. He participates in exercise intermittently. His home blood glucose trend is increasing steadily. His breakfast blood glucose is taken between 8-9 am. His breakfast blood glucose range is generally >200 mg/dl. His dinner blood glucose range is generally 110-130 mg/dl. An ACE inhibitor/angiotensin II receptor blocker is being taken.    Lab Results  Component Value Date   CREATININE 1.55 (H) 07/03/2019   BUN 35 (H) 07/03/2019   NA 137 07/03/2019   K 4.2 07/03/2019   CL 104 07/03/2019   CO2 23 07/03/2019   Lab Results  Component Value Date   CHOL 257 (H) 03/12/2019   HDL 59 03/12/2019   LDLCALC 167 (H) 03/12/2019   TRIG 172 (H) 03/12/2019   CHOLHDL 2.3 11/29/2016   Lab Results  Component Value Date   TSH 2.710 05/31/2019   Lab Results  Component Value Date   HGBA1C 6.6 (H) 11/07/2018   Lab Results  Component Value Date   WBC 11.9 (H) 07/03/2019   HGB 11.3 (L) 07/03/2019   HCT 31.8  (L) 07/03/2019   MCV 94.9 07/03/2019   PLT 175 07/03/2019   Lab Results  Component Value Date   ALT 15 01/01/2019   AST 21 01/01/2019   ALKPHOS 82 01/01/2019   BILITOT 0.7 01/01/2019     Review of Systems  Constitutional: Negative for chills, fatigue, fever and weight loss.  HENT: Negative for drooling, ear discharge, ear pain and sore throat.   Eyes: Negative for blurred vision.  Respiratory: Negative for cough, shortness of breath and wheezing.   Cardiovascular: Negative for chest pain, palpitations and leg swelling.  Gastrointestinal: Negative for abdominal pain, blood in stool, constipation, diarrhea and nausea.  Endocrine: Negative for polydipsia, polyphagia and polyuria.  Genitourinary: Negative for dysuria, frequency, hematuria and urgency.  Musculoskeletal: Negative for back pain, myalgias and neck pain.  Skin: Negative for rash.  Allergic/Immunologic: Negative for environmental allergies.  Neurological: Negative for dizziness, weakness and headaches.  Hematological: Does not bruise/bleed easily.  Psychiatric/Behavioral: Negative for suicidal ideas. The patient is not nervous/anxious.     Patient Active Problem List   Diagnosis Date Noted  . Atrial fibrillation, chronic ([place]) 06/27/2019  . Depression 06/27/2019  . Fall at home, initial encounter 06/27/2019  . Closed displaced fracture of left femoral neck ([place]) 06/27/2019  . History of anemia 05/31/2019  . Benign prostatic hyperplasia with lower urinary tract symptoms 05/31/2019  . Cerebral atrophy, mild ([place]) 05/31/2019  . Reactive depression 05/31/2019  . Essential hypertension 05/31/2019  . Hiatal hernia 05/31/2019  . Dizziness 04/12/2018  . TIA (transient ischemic attack) 01/25/2018  .  Chronic systolic CHF (congestive heart failure), NYHA class 3 ([place]) 11/29/2017  . CKD (chronic kidney disease) stage 3, GFR 30-59 ml/min (HCC) 08/18/2017  . Bradycardia 08/18/2017  . Atrial flutter, paroxysmal ([place]) 07/21/2017   . Functional dyspnea 07/06/2017  . Cardiomyopathy ([place]) 07/06/2017  . Hyperlipidemia, mixed 06/20/2017  . Hyperlipidemia associated with type 2 diabetes mellitus ([place]) 04/06/2016  . Type II diabetes mellitus with renal manifestations ([place]) 02/10/2015  . Personal history of other diseases of male genital organs 02/10/2015  . Sebaceous cyst 08/28/2014    No Known Allergies  Past Surgical History:  Procedure Laterality Date  . COLONOSCOPY N/A 02/26/2015   Procedure: COLONOSCOPY;  Surgeon: Hulen Luster, MD;  Location: [facility];  Service: Gastroenterology;  Laterality: N/A;  . COLONOSCOPY WITH PROPOFOL N/A 11/02/2017   Procedure: COLONOSCOPY WITH PROPOFOL;  Surgeon: Toledo, Benay Pike, MD;  Location: [facility];  Service: Gastroenterology;  Laterality: N/A;  . ESOPHAGOGASTRODUODENOSCOPY N/A 02/26/2015   Procedure: ESOPHAGOGASTRODUODENOSCOPY (EGD);  Surgeon: Hulen Luster, MD;  Location: [facility];  Service: Gastroenterology;  Laterality: N/A;  Diabetic - oral meds  . ESOPHAGOGASTRODUODENOSCOPY (EGD) WITH PROPOFOL N/A 11/02/2017   Procedure: ESOPHAGOGASTRODUODENOSCOPY (EGD) WITH PROPOFOL;  Surgeon: Toledo, Benay Pike, MD;  Location: [facility];  Service: Gastroenterology;  Laterality: N/A;  . HIP ARTHROPLASTY Left 06/28/2019   Procedure: ARTHROPLASTY BIPOLAR HIP (HEMIARTHROPLASTY);  Surgeon: Corky Mull, MD;  Location: [facility];  Service: Orthopedics;  Laterality: Left;  . KIDNEY SURGERY Right    surgery when 85 years old  . POLYPECTOMY  02/26/2015   Procedure: POLYPECTOMY;  Surgeon: Hulen Luster, MD;  Location: [facility];  Service: Gastroenterology;;  . sebaceous cyst removal Right    located right of spine on upper back    Social History   Tobacco Use  . Smoking status: Former Smoker    Quit date: 03/08/1978    Years since quitting: 41.8  . Smokeless tobacco: Never Used  Vaping Use  . Vaping Use: Never used  Substance Use Topics  . Alcohol use: Yes     Alcohol/week: 0.0 standard drinks    Comment: Rarely  . Drug use: No     Medication list has been reviewed and updated.  Current Meds  Medication Sig  . apixaban (ELIQUIS) 2.5 MG TABS tablet Take 1 tablet (2.5 mg total) by mouth 2 (two) times daily.  . carvedilol (COREG) 3.125 MG tablet Take 1 tablet (3.125 mg total) by mouth 2 (two) times daily with a meal.  . donepezil (ARICEPT) 10 MG tablet Take 1 tablet (10 mg total) by mouth daily. [place]  . ferrous sulfate 325 (65 FE) MG tablet Take 325 mg by mouth daily with breakfast.  . furosemide (LASIX) 20 MG tablet TAKE 1 TABLET BY MOUTH EVERY DAY  . ibuprofen (ADVIL) 200 MG tablet Take 200 mg by mouth every 6 (six) hours as needed.  . Lancets (ONETOUCH DELICA PLUS [id number]) MISC USE TO TEST ONCE DAILY  . losartan (COZAAR) 100 MG tablet TAKE 1/2 TABLET BY MOUTH DAILY  . Melatonin 5 MG TABS Take 5 mg by mouth at bedtime.   . metFORMIN (GLUCOPHAGE-XR) 750 MG 24 hr tablet TAKE 1 TABLET BY MOUTH EVERY DAY WITH BREAKFAST  . ONETOUCH ULTRA test strip USE TO TEST ONCE A DAY  . simvastatin (ZOCOR) 80 MG tablet TAKE 1 TABLET BY MOUTH EVERY EVENING    PHQ 2/9 Scores 07/23/2019 05/31/2019 05/09/2018 11/29/2016  PHQ - 2 Score 0 2  0 0  PHQ- 9 Score - 4 0 0    GAD 7 : Generalized Anxiety Score 05/31/2019  Nervous, Anxious, on Edge 0  Control/stop worrying 0  Worry too much - different things 0  Trouble relaxing 0  Restless 1  Easily annoyed or irritable 1  Afraid - awful might happen 0  Total GAD 7 Score 2  Anxiety Difficulty Not difficult at all    BP Readings from Last 3 Encounters:  01/11/20 120/62  10/04/19 124/68  07/05/19 (!) 151/67    Physical Exam Vitals and nursing note reviewed.  HENT:     Head: Normocephalic.     Right Ear: External ear normal. There is impacted cerumen.     Left Ear: Tympanic membrane and external ear normal.     Nose: Nose normal. No congestion or rhinorrhea.  Eyes:     General: No scleral icterus.        Right eye: No discharge.        Left eye: No discharge.     Conjunctiva/sclera: Conjunctivae normal.     Pupils: Pupils are equal, round, and reactive to light.  Neck:     Thyroid: No thyromegaly.     Vascular: No JVD.     Trachea: No tracheal deviation.  Cardiovascular:     Rate and Rhythm: Normal rate and regular rhythm.     Heart sounds: Normal heart sounds. No murmur heard.  No friction rub. No gallop.   Pulmonary:     Effort: No respiratory distress.     Breath sounds: Normal breath sounds. No wheezing, rhonchi or rales.  Abdominal:     General: Bowel sounds are normal.     Palpations: Abdomen is soft. There is no mass.     Tenderness: There is no abdominal tenderness. There is no guarding or rebound.  Musculoskeletal:        General: No tenderness. Normal range of motion.     Cervical back: Normal range of motion and neck supple.  Lymphadenopathy:     Cervical: No cervical adenopathy.  Skin:    General: Skin is warm.     Coloration: Skin is not pale.     Findings: No rash.  Neurological:     Mental Status: He is alert and oriented to person, place, and time.     Cranial Nerves: No cranial nerve deficit.     Deep Tendon Reflexes: Reflexes are normal and symmetric.     Wt Readings from Last 3 Encounters:  01/11/20 185 lb (83.9 kg)  10/04/19 178 lb (80.7 kg)  06/27/19 195 lb (88.5 kg)    BP 120/62   Pulse 80   Ht 5\' 11"  (1.803 m)   Wt 185 lb (83.9 kg)   BMI 25.80 kg/m    Assessment and Plan: 1. Type 2 diabetes mellitus without complication, without long-term current use of insulin (HCC) Chronic.  Controlled.  Stable.  Currently will continue with Metformin XR 750 once a day.  Reviewing the patient's blood sugars they are in the 200 range and her last A1c was 7.9.  We will obtain an A1c today stable with these hyperglycemia readings are trending.  Diet was rediscussed.  Patient with foot exam that was normal.  We will obtain a microalbuminuria to assess  current renal concerns. - Microalbumin, urine - glipiZIDE (GLUCOTROL) 5 MG tablet; Take 1 tablet (5 mg total) by mouth 2 (two) times daily before a meal.  Dispense: 60 tablet; Refill: 1 - Hemoglobin  A1c - Renal Function Panel  2. Stage 3b chronic kidney disease (HCC) Chronic.  Controlled.  Stable.  Patient has GFR in the low 30s.  I do have concern that there is been gradual worsening and we will repeat renal function panel to see we are since the last ones were done when he was dehydrated. - Renal Function Panel  3. Impacted cerumen of right ear Patient has impaction of the right eardrum with cerumen a portion of it was removed with irrigation.  Patient has been suggested to get Debrox and use in the ears to be used prior to showering and we will be glad to recheck in 2 to 3 weeks to see if we can remove the rest of it.  4. Cardiomyopathy, unspecified type ([place]) Chronic.  Controlled.  Stable.  We will continue the furosemide 20 mg once a day. - furosemide (LASIX) 20 MG tablet; Take 1 tablet (20 mg total) by mouth daily.  Dispense: 90 tablet; Refill: 1

## 2020-01-11 NOTE — Telephone Encounter (Signed)
214- am fasting this morning. Patient is concerned that his fasting glucose levels are trending up- he states he is taking his medication and he has not changed anything. Patient advised to bring reading to appointment- so PCP can see ho his levels are changing and make proper adjustments.  Reason for Disposition . Blood glucose 70-240 mg/dL (3.9 -13.3 mmol/L)  Answer Assessment - Initial Assessment Questions 1. BLOOD GLUCOSE: "What is your blood glucose level?"      214- occurring 189-190 creeping for several weeks 2. ONSET: "When did you check the blood glucose?"     Fast am 3. USUAL RANGE: "What is your glucose level usually?" (e.g., usual fasting morning value, usual evening value)     140-150 4. KETONES: "Do you check for ketones (urine or blood test strips)?" If yes, ask: "What does the test show now?"      n/a 5. TYPE 1 or 2:  "Do you know what type of diabetes you have?"  (e.g., Type 1, Type 2, Gestational; doesn't know)      borderline 6. INSULIN: "Do you take insulin?" "What type of insulin(s) do you use? What is the mode of delivery? (syringe, pen; injection or pump)?"      no 7. DIABETES PILLS: "Do you take any pills for your diabetes?" If yes, ask: "Have you missed taking any pills recently?"     Metformin- no missed pills 8. OTHER SYMPTOMS: "Do you have any symptoms?" (e.g., fever, frequent urination, difficulty breathing, dizziness, weakness, vomiting)     weakness- lack of energy 9. PREGNANCY: "Is there any chance you are pregnant?" "When was your last menstrual period?"     n/a  Protocols used: DIABETES - HIGH BLOOD SUGAR-A-AH

## 2020-01-12 LAB — HEMOGLOBIN A1C
Est. average glucose Bld gHb Est-mCnc: 183 mg/dL
Hgb A1c MFr Bld: 8 % — ABNORMAL HIGH (ref 4.8–5.6)

## 2020-01-12 LAB — RENAL FUNCTION PANEL
Albumin: 4.3 g/dL (ref 3.6–4.6)
BUN/Creatinine Ratio: 15 (ref 10–24)
BUN: 35 mg/dL — ABNORMAL HIGH (ref 8–27)
CO2: 25 mmol/L (ref 20–29)
Calcium: 8.9 mg/dL (ref 8.6–10.2)
Chloride: 107 mmol/L — ABNORMAL HIGH (ref 96–106)
Creatinine, Ser: 2.28 mg/dL — ABNORMAL HIGH (ref 0.76–1.27)
GFR calc Af Amer: 30 mL/min/{1.73_m2} — ABNORMAL LOW (ref >59)
GFR calc non Af Amer: 26 mL/min/{1.73_m2} — ABNORMAL LOW (ref >59)
Glucose: 194 mg/dL — ABNORMAL HIGH (ref 65–99)
Phosphorus: 4.2 mg/dL — ABNORMAL HIGH (ref 2.8–4.1)
Potassium: 5.1 mmol/L (ref 3.5–5.2)
Sodium: 144 mmol/L (ref 134–144)

## 2020-01-12 LAB — MICROALBUMIN, URINE: Microalbumin, Urine: 620.8 ug/mL

## 2020-01-14 ENCOUNTER — Other Ambulatory Visit: Payer: Self-pay

## 2020-01-14 DIAGNOSIS — E119 Type 2 diabetes mellitus without complications: Secondary | ICD-10-CM

## 2020-01-14 NOTE — Progress Notes (Signed)
Ref placed for Endo- I called neph and left message for them to call [facility] with appt for pt

## 2020-01-15 ENCOUNTER — Other Ambulatory Visit: Payer: Self-pay | Admitting: Family Medicine

## 2020-01-15 ENCOUNTER — Telehealth: Payer: Self-pay

## 2020-01-15 DIAGNOSIS — I429 Cardiomyopathy, unspecified: Secondary | ICD-10-CM

## 2020-01-15 DIAGNOSIS — N183 Chronic kidney disease, stage 3 unspecified: Secondary | ICD-10-CM

## 2020-01-15 NOTE — Telephone Encounter (Signed)
Called with appt for Dr Holley Raring on Nov 12 @ 10:20 in [place]

## 2020-01-15 NOTE — Telephone Encounter (Signed)
Requested Prescriptions  Pending Prescriptions Disp Refills   losartan (COZAAR) 100 MG tablet [Pharmacy Med Name: LOSARTAN POTASSIUM 100 MG TAB] 45 tablet 1    Sig: TAKE 1/2 TABLET BY MOUTH DAILY     Cardiovascular:  Angiotensin Receptor Blockers Failed - 01/15/2020  1:34 AM      Failed - Cr in normal range and within 180 days    Creatinine, Ser  Date Value Ref Range Status  01/11/2020 2.28 (H) 0.76 - 1.27 mg/dL Final         Passed - K in normal range and within 180 days    Potassium  Date Value Ref Range Status  01/11/2020 5.1 3.5 - 5.2 mmol/L Final         Passed - Patient is not pregnant      Passed - Last BP in normal range    BP Readings from Last 1 Encounters:  01/11/20 120/62         Passed - Valid encounter within last 6 months    Recent Outpatient Visits          4 days ago Type 2 diabetes mellitus without complication, without long-term current use of insulin ([place])   [facility] [name], MD   3 months ago Type 2 diabetes mellitus without complication, without long-term current use of insulin ([place])   [facility] [name], MD   7 months ago Malaise and fatigue   [facility] [name], MD   10 months ago Malaise and fatigue   [facility] [name], MD   10 months ago Malaise and fatigue   [facility] [name], MD      Future Appointments            In 1 week [name], MD [facility], [place]   In 4 weeks [name], MD [facility], [facility]

## 2020-01-17 ENCOUNTER — Other Ambulatory Visit: Payer: Self-pay

## 2020-01-17 ENCOUNTER — Telehealth: Payer: Self-pay

## 2020-01-17 DIAGNOSIS — E119 Type 2 diabetes mellitus without complications: Secondary | ICD-10-CM

## 2020-01-17 MED ORDER — GLIPIZIDE 5 MG PO TABS: 5.0000 mg | ORAL_TABLET | Freq: Two times a day (BID) | ORAL | 1 refills | Status: DC

## 2020-01-17 NOTE — Telephone Encounter (Unsigned)
Copied from [place] [id number]. Topic: General - Other >> Jan 17, 2020 11:28 AM Antonieta Iba C wrote: Reason for CRM: pt/spouse called in for assistance. They are calling in about medications glipiZIDE (GLUCOTROL) 5 MG tablet. Pt was told by pharmacy that they never received Rx. Pt would like to know if provider could resend Rx?   Please assist.

## 2020-01-17 NOTE — Progress Notes (Unsigned)
Sent in glipizide to [facility]

## 2020-01-17 NOTE — Telephone Encounter (Signed)
Spoke to pt- sent in Glipizide to [facility]

## 2020-01-18 DIAGNOSIS — I10 Essential (primary) hypertension: Secondary | ICD-10-CM | POA: Diagnosis not present

## 2020-01-18 DIAGNOSIS — E1122 Type 2 diabetes mellitus with diabetic chronic kidney disease: Secondary | ICD-10-CM | POA: Diagnosis not present

## 2020-01-18 DIAGNOSIS — N184 Chronic kidney disease, stage 4 (severe): Secondary | ICD-10-CM | POA: Diagnosis not present

## 2020-01-18 DIAGNOSIS — D631 Anemia in chronic kidney disease: Secondary | ICD-10-CM | POA: Diagnosis not present

## 2020-01-18 DIAGNOSIS — N2581 Secondary hyperparathyroidism of renal origin: Secondary | ICD-10-CM | POA: Diagnosis not present

## 2020-01-22 ENCOUNTER — Ambulatory Visit: Payer: Medicare HMO | Admitting: Family Medicine

## 2020-01-23 ENCOUNTER — Telehealth: Payer: Self-pay

## 2020-01-23 NOTE — Telephone Encounter (Signed)
Spoke to pt

## 2020-01-23 NOTE — Telephone Encounter (Unsigned)
Copied from [place] [id number]. Topic: General - Other >> Jan 23, 2020  9:52 AM Rainey Pines A wrote: Patients wife is requesting a callback from [facility] to speak with her in regards to a mix up with appointments. Please advise

## 2020-01-28 ENCOUNTER — Ambulatory Visit (INDEPENDENT_AMBULATORY_CARE_PROVIDER_SITE_OTHER): Payer: Medicare HMO

## 2020-01-28 ENCOUNTER — Other Ambulatory Visit: Payer: Self-pay

## 2020-01-28 VITALS — BP 140/70 | HR 56 | Temp 97.6°F | Resp 16 | Ht 71.0 in | Wt 189.0 lb

## 2020-01-28 DIAGNOSIS — Z0000 Encounter for general adult medical examination without abnormal findings: Secondary | ICD-10-CM

## 2020-01-28 NOTE — Patient Instructions (Addendum)
Cody Howard , Thank you for taking time to come for your Medicare Wellness Visit. I appreciate your ongoing commitment to your health goals. Please review the following plan we discussed and let me know if I can assist you in the future.   Screening recommendations/referrals: Colonoscopy: no longer required Recommended yearly ophthalmology/optometry visit for glaucoma screening and checkup Recommended yearly dental visit for hygiene and checkup  Vaccinations: Influenza vaccine: done 01/11/20 Pneumococcal vaccine: done 05/09/18 Tdap vaccine: done 06/16/19 Shingles vaccine: Shingrix discussed. Please contact your pharmacy for coverage information.  Covid-19:  Done 05/02/19 & 05/23/19  Advanced directives: Please bring a copy of your health care power of attorney and living will to the office at your convenience.  Conditions/risks identified: Recommend continuing to prevent falls in the home  Next appointment: Follow up in one year for your annual wellness visit.   Preventive Care 83 Years and Older, Male Preventive care refers to lifestyle choices and visits with your health care provider that can promote health and wellness. What does preventive care include?  A yearly physical exam. This is also called an annual well check.  Dental exams once or twice a year.  Routine eye exams. Ask your health care provider how often you should have your eyes checked.  Personal lifestyle choices, including:  Daily care of your teeth and gums.  Regular physical activity.  Eating a healthy diet.  Avoiding tobacco and drug use.  Limiting alcohol use.  Practicing safe sex.  Taking low doses of aspirin every day.  Taking vitamin and mineral supplements as recommended by your health care provider. What happens during an annual well check? The services and screenings done by your health care provider during your annual well check will depend on your age, overall health, lifestyle risk factors, and  family history of disease. Counseling  Your health care provider may ask you questions about your:  Alcohol use.  Tobacco use.  Drug use.  Emotional well-being.  Home and relationship well-being.  Sexual activity.  Eating habits.  History of falls.  Memory and ability to understand (cognition).  Work and work Statistician. Screening  You may have the following tests or measurements:  Height, weight, and BMI.  Blood pressure.  Lipid and cholesterol levels. These may be checked every 5 years, or more frequently if you are over 67 years old.  Skin check.  Lung cancer screening. You may have this screening every year starting at age 90 if you have a 30-pack-year history of smoking and currently smoke or have quit within the past 15 years.  Fecal occult blood test (FOBT) of the stool. You may have this test every year starting at age 53.  Flexible sigmoidoscopy or colonoscopy. You may have a sigmoidoscopy every 5 years or a colonoscopy every 10 years starting at age 65.  Prostate cancer screening. Recommendations will vary depending on your family history and other risks.  Hepatitis C blood test.  Hepatitis B blood test.  Sexually transmitted disease (STD) testing.  Diabetes screening. This is done by checking your blood sugar (glucose) after you have not eaten for a while (fasting). You may have this done every 1-3 years.  Abdominal aortic aneurysm (AAA) screening. You may need this if you are a current or former smoker.  Osteoporosis. You may be screened starting at age 5 if you are at high risk. Talk with your health care provider about your test results, treatment options, and if necessary, the need for more tests. Vaccines  Your health care provider may recommend certain vaccines, such as:  Influenza vaccine. This is recommended every year.  Tetanus, diphtheria, and acellular pertussis (Tdap, Td) vaccine. You may need a Td booster every 10 years.  Zoster  vaccine. You may need this after age 66.  Pneumococcal 13-valent conjugate (PCV13) vaccine. One dose is recommended after age 21.  Pneumococcal polysaccharide (PPSV23) vaccine. One dose is recommended after age 70. Talk to your health care provider about which screenings and vaccines you need and how often you need them. This information is not intended to replace advice given to you by your health care provider. Make sure you discuss any questions you have with your health care provider. Document Released: 03/21/2015 Document Revised: 11/12/2015 Document Reviewed: 12/24/2014 Elsevier Interactive Patient Education  2017 [place] Prevention in the Home Falls can cause injuries. They can happen to people of all ages. There are many things you can do to make your home safe and to help prevent falls. What can I do on the outside of my home?  Regularly fix the edges of walkways and driveways and fix any cracks.  Remove anything that might make you trip as you walk through a door, such as a raised step or threshold.  Trim any bushes or trees on the path to your home.  Use bright outdoor lighting.  Clear any walking paths of anything that might make someone trip, such as rocks or tools.  Regularly check to see if handrails are loose or broken. Make sure that both sides of any steps have handrails.  Any raised decks and porches should have guardrails on the edges.  Have any leaves, snow, or ice cleared regularly.  Use sand or salt on walking paths during winter.  Clean up any spills in your garage right away. This includes oil or grease spills. What can I do in the bathroom?  Use night lights.  Install grab bars by the toilet and in the tub and shower. Do not use towel bars as grab bars.  Use non-skid mats or decals in the tub or shower.  If you need to sit down in the shower, use a plastic, non-slip stool.  Keep the floor dry. Clean up any water that spills on the  floor as soon as it happens.  Remove soap buildup in the tub or shower regularly.  Attach bath mats securely with double-sided non-slip rug tape.  Do not have throw rugs and other things on the floor that can make you trip. What can I do in the bedroom?  Use night lights.  Make sure that you have a light by your bed that is easy to reach.  Do not use any sheets or blankets that are too big for your bed. They should not hang down onto the floor.  Have a firm chair that has side arms. You can use this for support while you get dressed.  Do not have throw rugs and other things on the floor that can make you trip. What can I do in the kitchen?  Clean up any spills right away.  Avoid walking on wet floors.  Keep items that you use a lot in easy-to-reach places.  If you need to reach something above you, use a strong step stool that has a grab bar.  Keep electrical cords out of the way.  Do not use floor polish or wax that makes floors slippery. If you must use wax, use non-skid floor wax.  Do  not have throw rugs and other things on the floor that can make you trip. What can I do with my stairs?  Do not leave any items on the stairs.  Make sure that there are handrails on both sides of the stairs and use them. Fix handrails that are broken or loose. Make sure that handrails are as long as the stairways.  Check any carpeting to make sure that it is firmly attached to the stairs. Fix any carpet that is loose or worn.  Avoid having throw rugs at the top or bottom of the stairs. If you do have throw rugs, attach them to the floor with carpet tape.  Make sure that you have a light switch at the top of the stairs and the bottom of the stairs. If you do not have them, ask someone to add them for you. What else can I do to help prevent falls?  Wear shoes that:  Do not have high heels.  Have rubber bottoms.  Are comfortable and fit you well.  Are closed at the toe. Do not wear  sandals.  If you use a stepladder:  Make sure that it is fully opened. Do not climb a closed stepladder.  Make sure that both sides of the stepladder are locked into place.  Ask someone to hold it for you, if possible.  Clearly mark and make sure that you can see:  Any grab bars or handrails.  First and last steps.  Where the edge of each step is.  Use tools that help you move around (mobility aids) if they are needed. These include:  Canes.  Walkers.  Scooters.  Crutches.  Turn on the lights when you go into a dark area. Replace any light bulbs as soon as they burn out.  Set up your furniture so you have a clear path. Avoid moving your furniture around.  If any of your floors are uneven, fix them.  If there are any pets around you, be aware of where they are.  Review your medicines with your doctor. Some medicines can make you feel dizzy. This can increase your chance of falling. Ask your doctor what other things that you can do to help prevent falls. This information is not intended to replace advice given to you by your health care provider. Make sure you discuss any questions you have with your health care provider. Document Released: 12/19/2008 Document Revised: 07/31/2015 Document Reviewed: 03/29/2014 Elsevier Interactive Patient Education  2017 [employer].

## 2020-01-28 NOTE — Progress Notes (Signed)
Subjective:   Cody Howard is a 85 y.o. male who presents for Medicare Annual/Subsequent preventive examination.  Review of Systems     Cardiac Risk Factors include: advanced age (>52men, >52 women);diabetes mellitus;dyslipidemia;hypertension;male gender;sedentary lifestyle     Objective:    Today's Vitals   01/28/20 1335  BP: 140/70  Pulse: (!) 56  Resp: 16  Temp: 97.6 F (36.4 C)  TempSrc: Oral  SpO2: 99%  Weight: 189 lb (85.7 kg)  Height: 5\' 11"  (1.803 m)   Body mass index is 26.36 kg/m.  Advanced Directives 01/28/2020 07/23/2019 06/28/2019 06/27/2019 06/27/2019 01/01/2019 05/10/2018  Does Patient Have a Medical Advance Directive? Yes Yes No No No Yes Yes  Type of Paramedic of [place];Living will [place];Living will - - - [place];Living will Living will;Healthcare Power of Attorney  Does patient want to make changes to medical advance directive? - No - Patient declined - - - No - Patient declined -  Copy of [place] in Chart? No - copy requested - - - - No - copy requested No - copy requested  Would patient like information on creating a medical advance directive? - No - Patient declined No - Patient declined No - Patient declined - - -    Current Medications (verified) Outpatient Encounter Medications as of 01/28/2020  Medication Sig  . apixaban (ELIQUIS) 2.5 MG TABS tablet Take 1 tablet (2.5 mg total) by mouth 2 (two) times daily.  . carbamide peroxide (DEBROX) 6.5 % OTIC solution Place 5 drops into the right ear 2 (two) times daily.  . carvedilol (COREG) 3.125 MG tablet Take 1 tablet (3.125 mg total) by mouth 2 (two) times daily with a meal.  . donepezil (ARICEPT) 10 MG tablet Take 1 tablet (10 mg total) by mouth daily. Chipper Herb  . ferrous sulfate 325 (65 FE) MG tablet Take 325 mg by mouth daily with breakfast.  . furosemide (LASIX) 20 MG tablet Take 1 tablet (20 mg total) by  mouth daily.  Marland Kitchen glipiZIDE (GLUCOTROL) 5 MG tablet Take 1 tablet (5 mg total) by mouth 2 (two) times daily before a meal.  . ibuprofen (ADVIL) 200 MG tablet Take 200 mg by mouth every 6 (six) hours as needed.  . Lancets (ONETOUCH DELICA PLUS [id number]) MISC USE TO TEST ONCE DAILY  . losartan (COZAAR) 100 MG tablet TAKE 1/2 TABLET BY MOUTH DAILY  . Melatonin 5 MG TABS Take 5 mg by mouth at bedtime.   . metFORMIN (GLUCOPHAGE-XR) 750 MG 24 hr tablet TAKE 1 TABLET BY MOUTH EVERY DAY WITH BREAKFAST  . ONETOUCH ULTRA test strip USE TO TEST ONCE A DAY  . simvastatin (ZOCOR) 80 MG tablet TAKE 1 TABLET BY MOUTH EVERY EVENING   No facility-administered encounter medications on file as of 01/28/2020.    Allergies (verified) Patient has no known allergies.   History: Past Medical History:  Diagnosis Date  . AF (atrial fibrillation) ([place])   . Arthritis    hands  . Chronic kidney disease   . Diabetes mellitus without complication ([place])   . Hyperlipidemia   . Hypertension   . Prostate enlargement    Past Surgical History:  Procedure Laterality Date  . COLONOSCOPY N/A 02/26/2015   Procedure: COLONOSCOPY;  Surgeon: Hulen Luster, MD;  Location: [facility];  Service: Gastroenterology;  Laterality: N/A;  . COLONOSCOPY WITH PROPOFOL N/A 11/02/2017   Procedure: COLONOSCOPY WITH PROPOFOL;  Surgeon: Marquette, Veguita  K, MD;  Location: [facility];  Service: Gastroenterology;  Laterality: N/A;  . ESOPHAGOGASTRODUODENOSCOPY N/A 02/26/2015   Procedure: ESOPHAGOGASTRODUODENOSCOPY (EGD);  Surgeon: Hulen Luster, MD;  Location: [facility];  Service: Gastroenterology;  Laterality: N/A;  Diabetic - oral meds  . ESOPHAGOGASTRODUODENOSCOPY (EGD) WITH PROPOFOL N/A 11/02/2017   Procedure: ESOPHAGOGASTRODUODENOSCOPY (EGD) WITH PROPOFOL;  Surgeon: Toledo, Benay Pike, MD;  Location: [facility];  Service: Gastroenterology;  Laterality: N/A;  . HIP ARTHROPLASTY Left 06/28/2019   Procedure: ARTHROPLASTY  BIPOLAR HIP (HEMIARTHROPLASTY);  Surgeon: Corky Mull, MD;  Location: [facility];  Service: Orthopedics;  Laterality: Left;  . KIDNEY SURGERY Right    surgery when 85 years old  . POLYPECTOMY  02/26/2015   Procedure: POLYPECTOMY;  Surgeon: Hulen Luster, MD;  Location: [facility];  Service: Gastroenterology;;  . sebaceous cyst removal Right    located right of spine on upper back   Family History  Problem Relation Age of Onset  . Diabetes Father   . Heart disease Father    Social History   Socioeconomic History  . Marital status: Married    Spouse name: Not on file  . Number of children: 2  . Years of education: Not on file  . Highest education level: Associate degree: occupational, Hotel manager, or vocational program  Occupational History  . Occupation: retired  Tobacco Use  . Smoking status: Former Smoker    Quit date: 03/08/1978    Years since quitting: 41.9  . Smokeless tobacco: Never Used  Vaping Use  . Vaping Use: Never used  Substance and Sexual Activity  . Alcohol use: Yes    Alcohol/week: 0.0 standard drinks    Comment: Rarely  . Drug use: No  . Sexual activity: Not Currently  Other Topics Concern  . Not on file  Social History Narrative  . Not on file   Social Determinants of Health   Financial Resource Strain: Low Risk   . Difficulty of Paying Living Expenses: Not hard at all  Food Insecurity: No Food Insecurity  . Worried About Charity fundraiser in the Last Year: Never true  . Ran Out of Food in the Last Year: Never true  Transportation Needs: No Transportation Needs  . Lack of Transportation (Medical): No  . Lack of Transportation (Non-Medical): No  Physical Activity: Inactive  . Days of Exercise per Week: 0 days  . Minutes of Exercise per Session: 0 min  Stress: No Stress Concern Present  . Feeling of Stress : Only a little  Social Connections: Moderately Integrated  . Frequency of Communication with Friends and Family: More than three times a  week  . Frequency of Social Gatherings with Friends and Family: Three times a week  . Attends Religious Services: More than 4 times per year  . Active Member of Clubs or Organizations: No  . Attends Archivist Meetings: Never  . Marital Status: Married    Tobacco Counseling Counseling given: Not Answered   Clinical Intake:  Pre-visit preparation completed: Yes  Pain : No/denies pain     BMI - recorded: 26.36 Nutritional Status: BMI 25 -29 Overweight Nutritional Risks: None Diabetes: Yes CBG done?: No Did pt. bring in CBG monitor from home?: No  How often do you need to have someone help you when you read instructions, pamphlets, or other written materials from your doctor or pharmacy?: 1 - Never  Nutrition Risk Assessment:  Has the patient had any N/V/D within the last 2 months?  No  Does the patient have any non-healing wounds?  No  Has the patient had any unintentional weight loss or weight gain?  No   Diabetes:  Is the patient diabetic?  Yes  If diabetic, was a CBG obtained today?  No  Did the patient bring in their glucometer from home?  No  How often do you monitor your CBG's? daily.   Financial Strains and Diabetes Management:  Are you having any financial strains with the device, your supplies or your medication? No .  Does the patient want to be seen by Chronic Care Management for management of their diabetes?  No  Would the patient like to be referred to a Nutritionist or for Diabetic Management?  No   Diabetic Exams:  Diabetic Eye Exam: Completed 11/20/19 negative retinopathy.   Diabetic Foot Exam: Completed 04/04/18. Pt has been advised about the importance in completing this exam. Pt is scheduled for diabetic foot exam on 02/14/20.    Interpreter Needed?: No  Information entered by :: Clemetine Marker LPN   Activities of Daily Living In your present state of health, do you have any difficulty performing the following activities: 01/28/2020  06/27/2019  Hearing? N N  Comment declines hearing aids -  Vision? N N  Difficulty concentrating or making decisions? Tempie Donning  Walking or climbing stairs? Y N  Dressing or bathing? N N  Doing errands, shopping? N N  Preparing Food and eating ? N -  Using the Toilet? N -  In the past six months, have you accidently leaked urine? Y -  Comment wears pads for protection -  Do you have problems with loss of bowel control? N -  Managing your Medications? Y -  Managing your Finances? N -  Housekeeping or managing your Housekeeping? N -  Some recent data might be hidden    Patient Care Team: [name], MD as PCP - General (Family Medicine) Jannifer Franklin, NP as Nurse Practitioner (Neurology) Idelle Leech, [place] (Optometry) Corey Skains, MD as Consulting Physician (Cardiology)  Indicate any recent Medical Services you may have received from other than [facility] providers in the past year (date may be approximate).     Assessment:   This is a routine wellness examination for [place].  Hearing/Vision screen  Hearing Screening   125Hz  250Hz  500Hz  1000Hz  2000Hz  3000Hz  4000Hz  6000Hz  8000Hz   Right ear:           Left ear:           Comments: Pt denies hearing difficulty  Vision Screening Comments: Annual vision sreenings done by Dr. Jomarie Longs  Dietary issues and exercise activities discussed: Current Exercise Habits: The patient does not participate in regular exercise at present, Exercise limited by: orthopedic condition(s)  Goals    . Patient Stated     Patient states he would like to remain healthy.      Depression Screen PHQ 2/9 Scores 01/28/2020 07/23/2019 05/31/2019 05/09/2018 11/29/2016 08/07/2015 03/14/2015  PHQ - 2 Score 1 0 2 0 0 0 0  PHQ- 9 Score 4 - 4 0 0 - -    Fall Risk Fall Risk  01/28/2020 07/23/2019 05/31/2019 05/09/2018 11/29/2016  Falls in the past year? 1 1 0 0 No  Number falls in past yr: 1 0 0 0 -  Injury with Fall? 1 1 0 0 -  Risk for fall due to : History of  fall(s);Impaired balance/gait History of fall(s);Medication side effect;Impaired mobility;Orthopedic patient No  Fall Risks - -  Follow up Falls prevention discussed Falls prevention discussed Falls evaluation completed - -    Any stairs in or around the home? Yes  If so, are there any without handrails? Yes  2 steps outside Home free of loose throw rugs in walkways, pet beds, electrical cords, etc? Yes  Adequate lighting in your home to reduce risk of falls? Yes   ASSISTIVE DEVICES UTILIZED TO PREVENT FALLS:  Life alert? No  Use of a cane, walker or w/c? No  Grab bars in the bathroom? Yes  Shower chair or bench in shower? Yes  Elevated toilet seat or a handicapped toilet? Yes   TIMED UP AND GO:  Was the test performed? Yes .  Length of time to ambulate 10 feet: 7 sec.   Gait slow and steady without use of assistive device  Cognitive Function: Cognitive screening tool deferred due to patient's current diagnosis of cognitive impairment. Patient is followed by Chipper Herb NP with neurology for ongoing assessment.       6CIT Screen 05/10/2018  What Year? 0 points  What month? 0 points  What time? 0 points  Count back from 20 0 points  Months in reverse 4 points  Repeat phrase 4 points  Total Score 8    Immunizations Immunization History  Administered Date(s) Administered  . Fluad Quad(high Dose 65+) 01/11/2020  . Influenza,inj,quad, With Preservative 12/17/2017  . Influenza-Unspecified 12/07/2018  . PFIZER SARS-COV-2 Vaccination 05/02/2019, 05/23/2019  . Pneumococcal Conjugate-13 05/09/2018  . Pneumococcal Polysaccharide-23 07/31/2010  . Tdap 11/29/2014, 06/16/2019    TDAP status: Up to date   Flu Vaccine status: Up to date   Pneumococcal vaccine status: Up to date   Covid-19 vaccine status: Completed vaccines  Qualifies for Shingles Vaccine? Yes   Zostavax completed No   Shingrix Completed?: No.    Education has been provided regarding the importance of this  vaccine. Patient has been advised to call insurance company to determine out of pocket expense if they have not yet received this vaccine. Advised may also receive vaccine at local pharmacy or Health Dept. Verbalized acceptance and understanding.  Screening Tests Health Maintenance  Topic Date Due  . FOOT EXAM  04/05/2019  . HEMOGLOBIN A1C  07/10/2020  . OPHTHALMOLOGY EXAM  11/19/2020  . TETANUS/TDAP  06/15/2029  . INFLUENZA VACCINE  Completed  . COVID-19 Vaccine  Completed  . PNA vac Low Risk Adult  Completed    Health Maintenance  Health Maintenance Due  Topic Date Due  . FOOT EXAM  04/05/2019    Colorectal cancer screening: No longer required. due to age  Lung Cancer Screening: (Low Dose CT Chest recommended if Age 2-80 years, 30 pack-year currently smoking OR have quit w/in 15years.) does not qualify.   Additional Screening:  Hepatitis C Screening: does not qualify;   Vision Screening: Recommended annual ophthalmology exams for early detection of glaucoma and other disorders of the eye. Is the patient up to date with their annual eye exam?  Yes  Who is the provider or what is the name of the office in which the patient attends annual eye exams? Dr. Matilde Sprang  Dental Screening: Recommended annual dental exams for proper oral hygiene  Community Resource Referral / Chronic Care Management: CRR required this visit?  No   CCM required this visit?  No      Plan:     I have personally reviewed and noted the following in the patient's chart:   .  Medical and social history . Use of alcohol, tobacco or illicit drugs  . Current medications and supplements . Functional ability and status . Nutritional status . Physical activity . Advanced directives . List of other physicians . Hospitalizations, surgeries, and ER visits in previous 12 months . Vitals . Screenings to include cognitive, depression, and falls . Referrals and appointments  In addition, I have reviewed and  discussed with patient certain preventive protocols, quality metrics, and best practice recommendations. A written personalized care plan for preventive services as well as general preventive health recommendations were provided to patient.     Clemetine Marker, LPN   [id number]   Nurse Notes: pt c/o runny nose during the day; pt plans to try Flonase.

## 2020-02-04 ENCOUNTER — Ambulatory Visit: Payer: Medicare HMO | Admitting: Family Medicine

## 2020-02-08 ENCOUNTER — Other Ambulatory Visit: Payer: Self-pay | Admitting: Family Medicine

## 2020-02-08 DIAGNOSIS — E1165 Type 2 diabetes mellitus with hyperglycemia: Secondary | ICD-10-CM | POA: Diagnosis not present

## 2020-02-08 DIAGNOSIS — E782 Mixed hyperlipidemia: Secondary | ICD-10-CM | POA: Diagnosis not present

## 2020-02-08 DIAGNOSIS — I10 Essential (primary) hypertension: Secondary | ICD-10-CM | POA: Diagnosis not present

## 2020-02-08 DIAGNOSIS — N184 Chronic kidney disease, stage 4 (severe): Secondary | ICD-10-CM | POA: Diagnosis not present

## 2020-02-08 DIAGNOSIS — E119 Type 2 diabetes mellitus without complications: Secondary | ICD-10-CM

## 2020-02-10 ENCOUNTER — Other Ambulatory Visit: Payer: Self-pay | Admitting: Family Medicine

## 2020-02-10 DIAGNOSIS — E1169 Type 2 diabetes mellitus with other specified complication: Secondary | ICD-10-CM

## 2020-02-10 DIAGNOSIS — E785 Hyperlipidemia, unspecified: Secondary | ICD-10-CM

## 2020-02-10 NOTE — Telephone Encounter (Signed)
Requested Prescriptions  Pending Prescriptions Disp Refills  . simvastatin (ZOCOR) 80 MG tablet [Pharmacy Med Name: SIMVASTATIN 80 MG TABLET] 90 tablet 0    Sig: TAKE 1 TABLET BY MOUTH EVERY EVENING     Cardiovascular:  Antilipid - Statins Failed - 02/10/2020  9:30 AM      Failed - Total Cholesterol in normal range and within 360 days    Cholesterol, Total  Date Value Ref Range Status  03/12/2019 257 (H) 100 - 199 mg/dL Final         Failed - LDL in normal range and within 360 days    LDL Chol Calc (NIH)  Date Value Ref Range Status  03/12/2019 167 (H) 0 - 99 mg/dL Final         Failed - Triglycerides in normal range and within 360 days    Triglycerides  Date Value Ref Range Status  03/12/2019 172 (H) 0 - 149 mg/dL Final         Passed - HDL in normal range and within 360 days    HDL  Date Value Ref Range Status  03/12/2019 59 >39 mg/dL Final         Passed - Patient is not pregnant      Passed - Valid encounter within last 12 months    Recent Outpatient Visits          1 month ago Type 2 diabetes mellitus without complication, without long-term current use of insulin ([place])   [facility] [name], MD   4 months ago Type 2 diabetes mellitus without complication, without long-term current use of insulin ([place])   [facility] [name], MD   8 months ago Malaise and fatigue   [facility] [name], MD   11 months ago Malaise and fatigue   [facility] [name], MD   11 months ago Malaise and fatigue   [facility] [name], MD      Future Appointments            In 4 days [name], MD [facility], [facility]

## 2020-02-13 ENCOUNTER — Ambulatory Visit: Payer: Medicare HMO | Admitting: Family Medicine

## 2020-02-13 ENCOUNTER — Telehealth: Payer: Self-pay

## 2020-02-13 NOTE — Telephone Encounter (Signed)
Copied from [place] [id number]. Topic: General - Inquiry >> Feb 13, 2020  8:40 AM Cody Howard wrote: Reason for CRM: pt wants to know if he needs to fast before his appt in the morning?

## 2020-02-14 ENCOUNTER — Ambulatory Visit (INDEPENDENT_AMBULATORY_CARE_PROVIDER_SITE_OTHER): Payer: Medicare HMO | Admitting: Family Medicine

## 2020-02-14 ENCOUNTER — Other Ambulatory Visit: Payer: Self-pay

## 2020-02-14 ENCOUNTER — Encounter: Payer: Self-pay | Admitting: Family Medicine

## 2020-02-14 VITALS — BP 120/70 | HR 72 | Ht 71.0 in | Wt 186.0 lb

## 2020-02-14 DIAGNOSIS — J3489 Other specified disorders of nose and nasal sinuses: Secondary | ICD-10-CM | POA: Diagnosis not present

## 2020-02-14 DIAGNOSIS — D508 Other iron deficiency anemias: Secondary | ICD-10-CM | POA: Diagnosis not present

## 2020-02-14 DIAGNOSIS — E119 Type 2 diabetes mellitus without complications: Secondary | ICD-10-CM

## 2020-02-14 MED ORDER — AZELASTINE HCL 0.1 % NA SOLN: 1.0000 | Freq: Two times a day (BID) | NASAL | 12 refills | Status: DC

## 2020-02-14 NOTE — Progress Notes (Signed)
Date:  02/14/2020   Name:  Cody Howard   DOB:  21-Jul-1934   MRN:  [id number]   Chief Complaint: Diabetes (Nephrologist stopped metformin- recheck A1C- needs foot exam)  Diabetes He presents for his follow-up diabetic visit. He has type 2 diabetes mellitus. His disease course has been stable. There are no hypoglycemic associated symptoms. Pertinent negatives for hypoglycemia include no confusion, dizziness, headaches, nervousness/anxiousness or pallor. There are no diabetic associated symptoms. Pertinent negatives for diabetes include no blurred vision, no chest pain, no fatigue, no foot paresthesias, no foot ulcerations, no polydipsia, no polyphagia, no polyuria, no visual change, no weakness and no weight loss. There are no hypoglycemic complications. Symptoms are stable. There are no diabetic complications. There are no known risk factors for coronary artery disease. Current diabetic treatment includes oral agent (dual therapy). He is compliant with treatment most of the time. He is following a generally healthy diet. Meal planning includes avoidance of concentrated sweets and carbohydrate counting. He participates in exercise intermittently. His breakfast blood glucose is taken between 8-9 am. His breakfast blood glucose range is generally 110-130 mg/dl. An ACE inhibitor/angiotensin II receptor blocker is being taken.  Anemia Presents for follow-up visit. There has been no abdominal pain, anorexia, bruising/bleeding easily, confusion, fever, leg swelling, light-headedness, malaise/fatigue, pallor, palpitations, paresthesias, pica or weight loss. Signs of blood loss that are not present include hematemesis, hematochezia and melena. There are no compliance problems.  Side effects of medications include GI discomfort.  URI  This is a chronic problem. The current episode started more than 1 month ago. The problem has been waxing and waning (primarily occurs with eating). Associated symptoms  include rhinorrhea. Pertinent negatives include no abdominal pain, chest pain, congestion, coughing, diarrhea, dysuria, ear pain, headaches, joint pain, joint swelling, nausea, neck pain, plugged ear sensation, rash, sinus pain, sneezing, sore throat, swollen glands, vomiting or wheezing. The treatment provided mild relief.    Lab Results  Component Value Date   CREATININE 2.28 (H) 01/11/2020   BUN 35 (H) 01/11/2020   NA 144 01/11/2020   K 5.1 01/11/2020   CL 107 (H) 01/11/2020   CO2 25 01/11/2020   Lab Results  Component Value Date   CHOL 257 (H) 03/12/2019   HDL 59 03/12/2019   LDLCALC 167 (H) 03/12/2019   TRIG 172 (H) 03/12/2019   CHOLHDL 2.3 11/29/2016   Lab Results  Component Value Date   TSH 2.710 05/31/2019   Lab Results  Component Value Date   HGBA1C 8.0 (H) 01/11/2020   Lab Results  Component Value Date   WBC 11.9 (H) 07/03/2019   HGB 11.3 (L) 07/03/2019   HCT 31.8 (L) 07/03/2019   MCV 94.9 07/03/2019   PLT 175 07/03/2019   Lab Results  Component Value Date   ALT 15 01/01/2019   AST 21 01/01/2019   ALKPHOS 82 01/01/2019   BILITOT 0.7 01/01/2019     Review of Systems  Constitutional: Negative for chills, fatigue, fever, malaise/fatigue and weight loss.  HENT: Positive for rhinorrhea. Negative for congestion, drooling, ear discharge, ear pain, sinus pain, sneezing and sore throat.   Eyes: Negative for blurred vision.  Respiratory: Negative for cough, shortness of breath and wheezing.   Cardiovascular: Negative for chest pain, palpitations and leg swelling.  Gastrointestinal: Negative for abdominal pain, anorexia, blood in stool, constipation, diarrhea, hematemesis, hematochezia, melena, nausea and vomiting.  Endocrine: Negative for polydipsia, polyphagia and polyuria.  Genitourinary: Negative for dysuria, frequency, hematuria and urgency.  Musculoskeletal: Negative for back pain, joint pain, myalgias and neck pain.  Skin: Negative for pallor and rash.   Allergic/Immunologic: Negative for environmental allergies.  Neurological: Negative for dizziness, weakness, light-headedness, headaches and paresthesias.  Hematological: Does not bruise/bleed easily.  Psychiatric/Behavioral: Negative for confusion and suicidal ideas. The patient is not nervous/anxious.     Patient Active Problem List   Diagnosis Date Noted  . Moderate mitral regurgitation 09/12/2019  . Status post hip hemiarthroplasty 06/28/2019  . Atrial fibrillation, chronic ([place]) 06/27/2019  . Depression 06/27/2019  . Fall at home, initial encounter 06/27/2019  . Closed displaced fracture of left femoral neck ([place]) 06/27/2019  . History of anemia 05/31/2019  . Benign prostatic hyperplasia with lower urinary tract symptoms 05/31/2019  . Cerebral atrophy, mild ([place]) 05/31/2019  . Reactive depression 05/31/2019  . Essential hypertension 05/31/2019  . Hiatal hernia 05/31/2019  . Cardiac syncope 02/20/2019  . Loss of memory 05/25/2018  . Dizziness 04/12/2018  . TIA (transient ischemic attack) 01/25/2018  . Chronic systolic CHF (congestive heart failure), NYHA class 3 ([place]) 11/29/2017  . CKD (chronic kidney disease) stage 3, GFR 30-59 ml/min (HCC) 08/18/2017  . Bradycardia 08/18/2017  . Atrial flutter, paroxysmal ([place]) 07/21/2017  . Functional dyspnea 07/06/2017  . Cardiomyopathy ([place]) 07/06/2017  . Hyperlipidemia, mixed 06/20/2017  . Hyperlipidemia associated with type 2 diabetes mellitus ([place]) 04/06/2016  . Type II diabetes mellitus with renal manifestations ([place]) 02/10/2015  . Personal history of other diseases of male genital organs 02/10/2015  . Sebaceous cyst 08/28/2014    No Known Allergies  Past Surgical History:  Procedure Laterality Date  . COLONOSCOPY N/A 02/26/2015   Procedure: COLONOSCOPY;  Surgeon: Hulen Luster, MD;  Location: [facility];  Service: Gastroenterology;  Laterality: N/A;  . COLONOSCOPY WITH PROPOFOL N/A 11/02/2017   Procedure: COLONOSCOPY  WITH PROPOFOL;  Surgeon: Toledo, Benay Pike, MD;  Location: [facility];  Service: Gastroenterology;  Laterality: N/A;  . ESOPHAGOGASTRODUODENOSCOPY N/A 02/26/2015   Procedure: ESOPHAGOGASTRODUODENOSCOPY (EGD);  Surgeon: Hulen Luster, MD;  Location: [facility];  Service: Gastroenterology;  Laterality: N/A;  Diabetic - oral meds  . ESOPHAGOGASTRODUODENOSCOPY (EGD) WITH PROPOFOL N/A 11/02/2017   Procedure: ESOPHAGOGASTRODUODENOSCOPY (EGD) WITH PROPOFOL;  Surgeon: Toledo, Benay Pike, MD;  Location: [facility];  Service: Gastroenterology;  Laterality: N/A;  . HIP ARTHROPLASTY Left 06/28/2019   Procedure: ARTHROPLASTY BIPOLAR HIP (HEMIARTHROPLASTY);  Surgeon: Corky Mull, MD;  Location: [facility];  Service: Orthopedics;  Laterality: Left;  . KIDNEY SURGERY Right    surgery when 85 years old  . POLYPECTOMY  02/26/2015   Procedure: POLYPECTOMY;  Surgeon: Hulen Luster, MD;  Location: [facility];  Service: Gastroenterology;;  . sebaceous cyst removal Right    located right of spine on upper back    Social History   Tobacco Use  . Smoking status: Former Smoker    Quit date: 03/08/1978    Years since quitting: 41.9  . Smokeless tobacco: Never Used  Vaping Use  . Vaping Use: Never used  Substance Use Topics  . Alcohol use: Yes    Alcohol/week: 0.0 standard drinks    Comment: Rarely  . Drug use: No     Medication list has been reviewed and updated.  Current Meds  Medication Sig  . apixaban (ELIQUIS) 2.5 MG TABS tablet Take 1 tablet (2.5 mg total) by mouth 2 (two) times daily.  . carbamide peroxide (DEBROX) 6.5 % OTIC solution Place 5 drops into the right ear 2 (two)  times daily.  . carvedilol (COREG) 3.125 MG tablet Take 1 tablet (3.125 mg total) by mouth 2 (two) times daily with a meal.  . donepezil (ARICEPT) 10 MG tablet Take 1 tablet (10 mg total) by mouth daily. Chi[place] Herb  . ferrous sulfate 325 (65 FE) MG tablet Take 325 mg by mouth daily with breakfast.  . furosemide  (LASIX) 20 MG tablet Take 1 tablet (20 mg total) by mouth daily.  Marland Kitchen glipiZIDE (GLUCOTROL) 5 MG tablet Take 1 tablet (5 mg total) by mouth 2 (two) times daily before a meal.  . ibuprofen (ADVIL) 200 MG tablet Take 200 mg by mouth every 6 (six) hours as needed.  . Lancets (ONETOUCH DELICA PLUS [id number]) MISC USE TO TEST ONCE DAILY  . losartan (COZAAR) 100 MG tablet TAKE 1/2 TABLET BY MOUTH DAILY  . Melatonin 5 MG TABS Take 5 mg by mouth at bedtime.   Glory Rosebush ULTRA test strip USE TO TEST ONCE A DAY  . simvastatin (ZOCOR) 80 MG tablet TAKE 1 TABLET BY MOUTH EVERY EVENING  . [DISCONTINUED] metFORMIN (GLUCOPHAGE-XR) 750 MG 24 hr tablet TAKE 1 TABLET BY MOUTH EVERY DAY WITH BREAKFAST    PHQ 2/9 Scores 01/28/2020 07/23/2019 05/31/2019 05/09/2018  PHQ - 2 Score 1 0 2 0  PHQ- 9 Score 4 - 4 0    GAD 7 : Generalized Anxiety Score 05/31/2019  Nervous, Anxious, on Edge 0  Control/stop worrying 0  Worry too much - different things 0  Trouble relaxing 0  Restless 1  Easily annoyed or irritable 1  Afraid - awful might happen 0  Total GAD 7 Score 2  Anxiety Difficulty Not difficult at all    BP Readings from Last 3 Encounters:  02/14/20 120/70  01/28/20 140/70  01/11/20 120/62    Physical Exam Vitals and nursing note reviewed.  HENT:     Head: Normocephalic.     Right Ear: Tympanic membrane, ear canal and external ear normal. There is no impacted cerumen.     Left Ear: Tympanic membrane, ear canal and external ear normal. There is no impacted cerumen.     Nose: Nose normal. No congestion or rhinorrhea.     Mouth/Throat:     Mouth: Oropharynx is clear and moist. Mucous membranes are moist.  Eyes:     General: No scleral icterus.       Right eye: No discharge.        Left eye: No discharge.     Extraocular Movements: EOM normal.     Conjunctiva/sclera: Conjunctivae normal.     Pupils: Pupils are equal, round, and reactive to light.  Neck:     Thyroid: No thyromegaly.     Vascular: No  carotid bruit or JVD.     Trachea: No tracheal deviation.  Cardiovascular:     Rate and Rhythm: Normal rate and regular rhythm.     Pulses: Intact distal pulses.          Dorsalis pedis pulses are 1+ on the right side and 1+ on the left side.       Posterior tibial pulses are 1+ on the right side and 1+ on the left side.     Heart sounds: Normal heart sounds. No murmur heard. No friction rub. No gallop.   Pulmonary:     Effort: No respiratory distress.     Breath sounds: Normal breath sounds. No wheezing, rhonchi or rales.  Abdominal:     General: Bowel sounds are normal.  Palpations: Abdomen is soft. There is no hepatosplenomegaly or mass.     Tenderness: There is no abdominal tenderness. There is no CVA tenderness, guarding or rebound.  Musculoskeletal:        General: No tenderness or edema. Normal range of motion.     Cervical back: Normal range of motion and neck supple.     Right foot: Normal range of motion. No deformity.     Left foot: Normal range of motion. No deformity.  Feet:     Right foot:     Protective Sensation: 10 sites tested. 10 sites sensed.     Skin integrity: Skin integrity normal. No ulcer, blister, skin breakdown, erythema, warmth, callus, dry skin or fissure.     Toenail Condition: Right toenails are normal.     Left foot:     Protective Sensation: 10 sites tested. 10 sites sensed.     Skin integrity: Skin integrity normal. No ulcer, blister, skin breakdown, erythema, warmth, callus, dry skin or fissure.     Toenail Condition: Left toenails are normal.  Lymphadenopathy:     Cervical: No cervical adenopathy.  Skin:    General: Skin is warm.     Findings: No rash.  Neurological:     Mental Status: He is alert and oriented to person, place, and time.     Cranial Nerves: No cranial nerve deficit.     Deep Tendon Reflexes: Strength normal and reflexes are normal and symmetric.     Wt Readings from Last 3 Encounters:  02/14/20 186 lb (84.4 kg)   01/28/20 189 lb (85.7 kg)  01/11/20 185 lb (83.9 kg)    BP 120/70   Pulse 72   Ht 5\' 11"  (1.803 m)   Wt 186 lb (84.4 kg)   BMI 25.94 kg/m   Assessment and Plan: Patient's chart was reviewed and previous encounters were noted as well as most recent labs, most recent imaging and care everywhere. 1. Type 2 diabetes mellitus without complication, without long-term current use of insulin (HCC)         Chronic.  Controlled.  Stable.  Blood sugars fasting are in the 1 30-1 35 range per patient.  There was a recent discontinuance of the Metformin due to GFR elevation.  We will wait for 6 weeks and recheck patient at that time we will do an A1c but now he will keep tabs with fasting blood sugars daily.  Foot exam was done today and it is 10/10 bilateral.  2. Rhinorrhea Chronic.  Persistent.  Stable.  Patient has had a history of rhinorrhea primarily with eating.  It does not have a sweet taste postnasal drainage.  Patient has had no head injury and has had no headaches to suggest a CSF leak.  We will try Astelin nasal spray 0.1% 1 spray in each nostril twice a day as needed.  We will recheck patient on an as-needed basis. - azelastine (ASTELIN) 0.1 % nasal spray; Place 1 spray into both nostrils 2 (two) times daily. Use in each nostril as directed  Dispense: 30 mL; Refill: 12  3. Anemia  Iron defiency: Patient has history of iron deficiency anemia.  Reviewed CBC and serum ferritin were noted.  Patient will be better and maintaining ferrous sulfate 325 mg once a day.

## 2020-02-26 ENCOUNTER — Telehealth: Payer: Self-pay

## 2020-02-26 ENCOUNTER — Other Ambulatory Visit: Payer: Self-pay | Admitting: Family Medicine

## 2020-02-26 DIAGNOSIS — E119 Type 2 diabetes mellitus without complications: Secondary | ICD-10-CM

## 2020-02-26 DIAGNOSIS — R69 Illness, unspecified: Secondary | ICD-10-CM | POA: Diagnosis not present

## 2020-02-26 NOTE — Telephone Encounter (Unsigned)
Copied from [place], [place] [phone]. Topic: General - Other >> Feb 26, 2020 12:16 PM Hinda Lenis D wrote: Pts wife calling to f/up on a referral / she ask to speak with Baxter Flattery / please advise

## 2020-02-26 NOTE — Telephone Encounter (Signed)
I called pt back and gave wife the appt for Dr Melrose Nakayama on Feb 14th @ 1:45- spoke to [place] at neuro and she gave me the appt as well as said they will send the donepezil in to [facility] to last the pt until being seen in Feb

## 2020-02-26 NOTE — Telephone Encounter (Signed)
Requested Prescriptions  Pending Prescriptions Disp Refills   ONETOUCH ULTRA test strip [Pharmacy Med Name: [place] TEST STRP] 100 strip 0    Sig: USE TO TEST ONCE A DAY     Endocrinology: Diabetes - Testing Supplies Passed - 02/26/2020 12:05 PM      Passed - Valid encounter within last 12 months    Recent Outpatient Visits          1 week ago Type 2 diabetes mellitus without complication, without long-term current use of insulin ([place])   [facility] [name], MD   1 month ago Type 2 diabetes mellitus without complication, without long-term current use of insulin ([place])   [facility] [name], MD   4 months ago Type 2 diabetes mellitus without complication, without long-term current use of insulin ([place])   [facility] [name], MD   9 months ago Malaise and fatigue   [facility] [name], MD   11 months ago Malaise and fatigue   [facility] [name], MD      Future Appointments            In 1 month [name], MD [facility], [facility]            glipiZIDE (GLUCOTROL) 5 MG tablet [Pharmacy Med Name: GLIPIZIDE 5 MG TABLET] 60 tablet 0    Sig: TAKE 1 TABLET (5 MG TOTAL) BY MOUTH 2 (TWO) TIMES DAILY BEFORE A MEAL.     Endocrinology:  Diabetes - Sulfonylureas Failed - 02/26/2020 12:05 PM      Failed - HBA1C is between 0 and 7.9 and within 180 days    Hgb A1c MFr Bld  Date Value Ref Range Status  01/11/2020 8.0 (H) 4.8 - 5.6 % Final    Comment:             Prediabetes: 5.7 - 6.4          Diabetes: >6.4          Glycemic control for adults with diabetes: <7.0          Passed - Valid encounter within last 6 months    Recent Outpatient Visits          1 week ago Type 2 diabetes mellitus without complication, without long-term current use of insulin ([place])   [facility] [name], MD   1 month ago Type 2 diabetes mellitus without complication, without  long-term current use of insulin ([place])   [facility] [name], MD   4 months ago Type 2 diabetes mellitus without complication, without long-term current use of insulin ([place])   [facility] [name], MD   9 months ago Malaise and fatigue   [facility] [name], MD   11 months ago Malaise and fatigue   [facility] [name], MD      Future Appointments            In 1 month [name], MD [facility], [facility]

## 2020-03-17 ENCOUNTER — Telehealth: Payer: Self-pay | Admitting: Family Medicine

## 2020-03-17 NOTE — Telephone Encounter (Signed)
Called pt and told wife to proceed with once a day ferrous sulfate

## 2020-03-17 NOTE — Telephone Encounter (Signed)
Patient's wife is calling to ask does the patient need to continue to take nature made iron supplement & ferrous sulfate 325 (65 FE) MG tablet [[id number]]  Please advise CB- [phone]

## 2020-03-18 DIAGNOSIS — E1122 Type 2 diabetes mellitus with diabetic chronic kidney disease: Secondary | ICD-10-CM | POA: Diagnosis not present

## 2020-03-18 DIAGNOSIS — R809 Proteinuria, unspecified: Secondary | ICD-10-CM | POA: Diagnosis not present

## 2020-03-18 DIAGNOSIS — I10 Essential (primary) hypertension: Secondary | ICD-10-CM | POA: Diagnosis not present

## 2020-03-18 DIAGNOSIS — D631 Anemia in chronic kidney disease: Secondary | ICD-10-CM | POA: Diagnosis not present

## 2020-03-18 DIAGNOSIS — N2581 Secondary hyperparathyroidism of renal origin: Secondary | ICD-10-CM | POA: Diagnosis not present

## 2020-03-18 DIAGNOSIS — N184 Chronic kidney disease, stage 4 (severe): Secondary | ICD-10-CM | POA: Diagnosis not present

## 2020-03-27 ENCOUNTER — Encounter: Payer: Self-pay | Admitting: Family Medicine

## 2020-03-27 ENCOUNTER — Ambulatory Visit: Payer: Medicare HMO | Admitting: Family Medicine

## 2020-03-27 ENCOUNTER — Ambulatory Visit (INDEPENDENT_AMBULATORY_CARE_PROVIDER_SITE_OTHER): Payer: Medicare HMO | Admitting: Family Medicine

## 2020-03-27 ENCOUNTER — Other Ambulatory Visit: Payer: Self-pay

## 2020-03-27 VITALS — BP 162/72 | HR 60 | Ht 71.0 in | Wt 189.0 lb

## 2020-03-27 DIAGNOSIS — R5383 Other fatigue: Secondary | ICD-10-CM

## 2020-03-27 DIAGNOSIS — F5102 Adjustment insomnia: Secondary | ICD-10-CM | POA: Diagnosis not present

## 2020-03-27 DIAGNOSIS — R194 Change in bowel habit: Secondary | ICD-10-CM

## 2020-03-27 DIAGNOSIS — R5381 Other malaise: Secondary | ICD-10-CM | POA: Diagnosis not present

## 2020-03-27 DIAGNOSIS — D508 Other iron deficiency anemias: Secondary | ICD-10-CM

## 2020-03-27 DIAGNOSIS — R69 Illness, unspecified: Secondary | ICD-10-CM | POA: Diagnosis not present

## 2020-03-27 NOTE — Progress Notes (Signed)
Date:  03/27/2020   Name:  Cody Howard   DOB:  February 08, 1935   MRN:  [id number]   Chief Complaint: Anemia  Anemia Presents for follow-up visit. Symptoms include bruises/bleeds easily. There has been no abdominal pain, anorexia, confusion, fever, leg swelling, light-headedness, malaise/fatigue, pallor, palpitations, paresthesias or weight loss. Signs of blood loss that are not present include hematemesis, hematochezia and melena. There is no history of inflammatory bowel disease. There are no compliance problems.  Side effects of medications include fatigue.  GI Problem The primary symptoms include diarrhea. Primary symptoms do not include fever, weight loss, fatigue, abdominal pain, nausea, vomiting, melena, hematemesis, jaundice, hematochezia, dysuria, myalgias, arthralgias or rash. Primary symptoms comment: soft/runny bowel movements.  The illness is also significant for bloating. The illness does not include chills, anorexia, dysphagia, constipation, tenesmus, back pain or itching. Associated medical issues do not include inflammatory bowel disease, gastric bypass or irritable bowel syndrome.  Insomnia Primary symptoms: fragmented sleep, no sleep disturbance, no difficulty falling asleep, no somnolence, no frequent awakening, no premature morning awakening, no malaise/fatigue, napping.   The problem has been waxing and waning since onset.    Lab Results  Component Value Date   CREATININE 2.28 (H) 01/11/2020   BUN 35 (H) 01/11/2020   NA 144 01/11/2020   K 5.1 01/11/2020   CL 107 (H) 01/11/2020   CO2 25 01/11/2020   Lab Results  Component Value Date   CHOL 257 (H) 03/12/2019   HDL 59 03/12/2019   LDLCALC 167 (H) 03/12/2019   TRIG 172 (H) 03/12/2019   CHOLHDL 2.3 11/29/2016   Lab Results  Component Value Date   TSH 2.710 05/31/2019   Lab Results  Component Value Date   HGBA1C 8.0 (H) 01/11/2020   Lab Results  Component Value Date   WBC 11.9 (H) 07/03/2019   HGB 11.3  (L) 07/03/2019   HCT 31.8 (L) 07/03/2019   MCV 94.9 07/03/2019   PLT 175 07/03/2019   Lab Results  Component Value Date   ALT 15 01/01/2019   AST 21 01/01/2019   ALKPHOS 82 01/01/2019   BILITOT 0.7 01/01/2019     Review of Systems  Constitutional: Negative for chills, fatigue, fever, malaise/fatigue and weight loss.  HENT: Negative for drooling, ear discharge, ear pain and sore throat.   Respiratory: Negative for cough, shortness of breath and wheezing.   Cardiovascular: Negative for chest pain, palpitations and leg swelling.  Gastrointestinal: Positive for bloating and diarrhea. Negative for abdominal pain, anorexia, blood in stool, constipation, dysphagia, hematemesis, hematochezia, jaundice, melena, nausea and vomiting.  Endocrine: Negative for polydipsia.  Genitourinary: Negative for dysuria, frequency, hematuria and urgency.  Musculoskeletal: Negative for arthralgias, back pain, myalgias and neck pain.  Skin: Negative for itching, pallor and rash.  Allergic/Immunologic: Negative for environmental allergies.  Neurological: Negative for dizziness, light-headedness, headaches and paresthesias.  Hematological: Bruises/bleeds easily.  Psychiatric/Behavioral: Negative for confusion, sleep disturbance and suicidal ideas. The patient has insomnia. The patient is not nervous/anxious.     Patient Active Problem List   Diagnosis Date Noted  . Moderate mitral regurgitation 09/12/2019  . Status post hip hemiarthroplasty 06/28/2019  . Atrial fibrillation, chronic ([place]) 06/27/2019  . Depression 06/27/2019  . Fall at home, initial encounter 06/27/2019  . Closed displaced fracture of left femoral neck ([place]) 06/27/2019  . History of anemia 05/31/2019  . Benign prostatic hyperplasia with lower urinary tract symptoms 05/31/2019  . Cerebral atrophy, mild ([place]) 05/31/2019  . Reactive depression 05/31/2019  .  Essential hypertension 05/31/2019  . Hiatal hernia 05/31/2019  . Cardiac syncope  02/20/2019  . Loss of memory 05/25/2018  . Dizziness 04/12/2018  . TIA (transient ischemic attack) 01/25/2018  . Chronic systolic CHF (congestive heart failure), NYHA class 3 ([place]) 11/29/2017  . CKD (chronic kidney disease) stage 3, GFR 30-59 ml/min ([facility]) 08/18/2017  . Bradycardia 08/18/2017  . Atrial flutter, paroxysmal ([place]) 07/21/2017  . Functional dyspnea 07/06/2017  . Cardiomyopathy ([place]) 07/06/2017  . Hyperlipidemia, mixed 06/20/2017  . Hyperlipidemia associated with type 2 diabetes mellitus ([place]) 04/06/2016  . Type II diabetes mellitus with renal manifestations ([place]) 02/10/2015  . Personal history of other diseases of male genital organs 02/10/2015  . Sebaceous cyst 08/28/2014    No Known Allergies  Past Surgical History:  Procedure Laterality Date  . COLONOSCOPY N/A 02/26/2015   Procedure: COLONOSCOPY;  Surgeon: Hulen Luster, MD;  Location: [place];  Service: Gastroenterology;  Laterality: N/A;  . COLONOSCOPY WITH PROPOFOL N/A 11/02/2017   Procedure: COLONOSCOPY WITH PROPOFOL;  Surgeon: Toledo, Benay Pike, MD;  Location: [facility];  Service: Gastroenterology;  Laterality: N/A;  . ESOPHAGOGASTRODUODENOSCOPY N/A 02/26/2015   Procedure: ESOPHAGOGASTRODUODENOSCOPY (EGD);  Surgeon: Hulen Luster, MD;  Location: [place];  Service: Gastroenterology;  Laterality: N/A;  Diabetic - oral meds  . ESOPHAGOGASTRODUODENOSCOPY (EGD) WITH PROPOFOL N/A 11/02/2017   Procedure: ESOPHAGOGASTRODUODENOSCOPY (EGD) WITH PROPOFOL;  Surgeon: Toledo, Benay Pike, MD;  Location: [facility];  Service: Gastroenterology;  Laterality: N/A;  . HIP ARTHROPLASTY Left 06/28/2019   Procedure: ARTHROPLASTY BIPOLAR HIP (HEMIARTHROPLASTY);  Surgeon: Corky Mull, MD;  Location: [facility];  Service: Orthopedics;  Laterality: Left;  . KIDNEY SURGERY Right    surgery when 86 years old  . POLYPECTOMY  02/26/2015   Procedure: POLYPECTOMY;  Surgeon: Hulen Luster, MD;  Location: [place];   Service: Gastroenterology;;  . sebaceous cyst removal Right    located right of spine on upper back    Social History   Tobacco Use  . Smoking status: Former Smoker    Quit date: 03/08/1978    Years since quitting: 42.0  . Smokeless tobacco: Never Used  Vaping Use  . Vaping Use: Never used  Substance Use Topics  . Alcohol use: Yes    Alcohol/week: 0.0 standard drinks    Comment: Rarely  . Drug use: No     Medication list has been reviewed and updated.  Current Meds  Medication Sig  . apixaban (ELIQUIS) 2.5 MG TABS tablet Take 1 tablet (2.5 mg total) by mouth 2 (two) times daily.  Marland Kitchen azelastine (ASTELIN) 0.1 % nasal spray Place 1 spray into both nostrils 2 (two) times daily. Use in each nostril as directed  . carbamide peroxide (DEBROX) 6.5 % OTIC solution Place 5 drops into the right ear 2 (two) times daily.  . carvedilol (COREG) 3.125 MG tablet Take 1 tablet (3.125 mg total) by mouth 2 (two) times daily with a meal.  . donepezil (ARICEPT) 10 MG tablet Take 1 tablet (10 mg total) by mouth daily. Chipper Herb  . ferrous sulfate 325 (65 FE) MG tablet Take 325 mg by mouth daily with breakfast.  . furosemide (LASIX) 20 MG tablet Take 1 tablet (20 mg total) by mouth daily.  Marland Kitchen glipiZIDE (GLUCOTROL) 5 MG tablet TAKE 1 TABLET (5 MG TOTAL) BY MOUTH 2 (TWO) TIMES DAILY BEFORE A MEAL.  Marland Kitchen ibuprofen (ADVIL) 200 MG tablet Take 200 mg by mouth every 6 (six) hours as needed.  Marland Kitchen  Lancets (ONETOUCH DELICA PLUS [id number]) MISC USE TO TEST ONCE DAILY  . losartan (COZAAR) 100 MG tablet TAKE 1/2 TABLET BY MOUTH DAILY  . Melatonin 5 MG TABS Take 5 mg by mouth at bedtime.   Glory Rosebush ULTRA test strip USE TO TEST ONCE A DAY  . simvastatin (ZOCOR) 80 MG tablet TAKE 1 TABLET BY MOUTH EVERY EVENING    PHQ 2/9 Scores 01/28/2020 07/23/2019 05/31/2019 05/09/2018  PHQ - 2 Score 1 0 2 0  PHQ- 9 Score 4 - 4 0    GAD 7 : Generalized Anxiety Score 05/31/2019  Nervous, Anxious, on Edge 0  Control/stop worrying 0   Worry too much - different things 0  Trouble relaxing 0  Restless 1  Easily annoyed or irritable 1  Afraid - awful might happen 0  Total GAD 7 Score 2  Anxiety Difficulty Not difficult at all    BP Readings from Last 3 Encounters:  03/27/20 (!) 162/72  02/14/20 120/70  01/28/20 140/70    Physical Exam Vitals and nursing note reviewed.  HENT:     Head: Normocephalic.     Right Ear: External ear normal.     Left Ear: External ear normal.     Nose: Nose normal.     Mouth/Throat:     Mouth: Oropharynx is clear and moist.  Eyes:     General: No scleral icterus.       Right eye: No discharge.        Left eye: No discharge.     Extraocular Movements: EOM normal.     Conjunctiva/sclera: Conjunctivae normal.     Pupils: Pupils are equal, round, and reactive to light.  Neck:     Thyroid: No thyromegaly.     Vascular: No JVD.     Trachea: No tracheal deviation.  Cardiovascular:     Rate and Rhythm: Normal rate and regular rhythm.     Pulses: Intact distal pulses.     Heart sounds: Normal heart sounds. No murmur heard. No friction rub. No gallop.   Pulmonary:     Effort: No respiratory distress.     Breath sounds: Normal breath sounds. No wheezing or rales.  Abdominal:     General: Bowel sounds are normal.     Palpations: Abdomen is soft. There is no hepatosplenomegaly or mass.     Tenderness: There is no abdominal tenderness. There is no CVA tenderness, guarding or rebound.  Musculoskeletal:        General: No tenderness or edema. Normal range of motion.     Cervical back: Normal range of motion and neck supple.  Lymphadenopathy:     Cervical: No cervical adenopathy.  Skin:    General: Skin is warm.     Findings: No rash.  Neurological:     Mental Status: He is alert and oriented to person, place, and time.     Cranial Nerves: No cranial nerve deficit.     Deep Tendon Reflexes: Strength normal and reflexes are normal and symmetric.     Wt Readings from Last 3  Encounters:  03/27/20 189 lb (85.7 kg)  02/14/20 186 lb (84.4 kg)  01/28/20 189 lb (85.7 kg)    BP (!) 162/72   Pulse 60 Comment: irregular  Ht 5\' 11"  (1.803 m)   Wt 189 lb (85.7 kg)   BMI 26.36 kg/m   Assessment and Plan: 1. Iron deficiency anemia secondary to inadequate dietary iron intake Chronic.  Controlled.  Stable.  Patient  is currently on ferrous sulfate supplementation for an iron deficiency anemia which was verified with decreased ferritin.  We will recheck a CBC and ferritin to see if the current dosing and therapy is sufficient. - Ambulatory referral to Gastroenterology - CBC with Differential/Platelet - Ferritin  2. Change in bowel habits Patient also notes that he is having continued change in his bowel habits for which he was seen in September 2020 by gastroenterology.  At that time he was having change in bowel habits along with some fecal incontinence.  He was treated with Colestid but this did not resolve and on reading notes there was some discussion the possibility of doing colonoscopy to rule out colitis which would also perhaps to be a factor in his anemia.  Referral has been made with [facility] gastroenterology for reevaluation and possible colonoscopy. - Ambulatory referral to Gastroenterology  3. Malaise and fatigue And general malaise and fatigue but patient does have some concerns about depression and in general-adjustment to the fact that were getting older.  We have reviewed his medications and there is no factor of for polypharmacy.  As below patient's had some insomnia and we will discussed what he can do to help sleep at night it seems like the last 2 nights patient became frustrated when he went to bed and had some incident dense of fecal incontinence on 1 of those nights.  4. Adjustment insomnia Relatively new onset.  Persistent.  The patient is not having issues with initiating sleep but is having difficulty in maintaining a sleep pattern.   Afterwards he cannot resume sleep patient has been given instructions that he can can do to help go to sleep easier without having to take an additional medications which he is already having concerns about.

## 2020-03-27 NOTE — Patient Instructions (Addendum)
Insomnia Insomnia is a sleep disorder that makes it difficult to fall asleep or stay asleep. Insomnia can cause fatigue, low energy, difficulty concentrating, mood swings, and poor performance at work or school. There are three different ways to classify insomnia:  Difficulty falling asleep.  Difficulty staying asleep.  Waking up too early in the morning. Any type of insomnia can be long-term (chronic) or short-term (acute). Both are common. Short-term insomnia usually lasts for three months or less. Chronic insomnia occurs at least three times a week for longer than three months. What are the causes? Insomnia may be caused by another condition, situation, or substance, such as:  Anxiety.  Certain medicines.  Gastroesophageal reflux disease (GERD) or other gastrointestinal conditions.  Asthma or other breathing conditions.  Restless legs syndrome, sleep apnea, or other sleep disorders.  Chronic pain.  Menopause.  Stroke.  Abuse of alcohol, tobacco, or illegal drugs.  Mental health conditions, such as depression.  Caffeine.  Neurological disorders, such as Alzheimer's disease.  An overactive thyroid (hyperthyroidism). Sometimes, the cause of insomnia may not be known. What increases the risk? Risk factors for insomnia include:  Gender. Women are affected more often than men.  Age. Insomnia is more common as you get older.  Stress.  Lack of exercise.  Irregular work schedule or working night shifts.  Traveling between different time zones.  Certain medical and mental health conditions. What are the signs or symptoms? If you have insomnia, the main symptom is having trouble falling asleep or having trouble staying asleep. This may lead to other symptoms, such as:  Feeling fatigued or having low energy.  Feeling nervous about going to sleep.  Not feeling rested in the morning.  Having trouble concentrating.  Feeling irritable, anxious, or depressed. How  is this diagnosed? This condition may be diagnosed based on:  Your symptoms and medical history. Your health care provider may ask about: ? Your sleep habits. ? Any medical conditions you have. ? Your mental health.  A physical exam. How is this treated? Treatment for insomnia depends on the cause. Treatment may focus on treating an underlying condition that is causing insomnia. Treatment may also include:  Medicines to help you sleep.  Counseling or therapy.  Lifestyle adjustments to help you sleep better. Follow these instructions at home: Eating and drinking  Limit or avoid alcohol, caffeinated beverages, and cigarettes, especially close to bedtime. These can disrupt your sleep.  Do not eat a large meal or eat spicy foods right before bedtime. This can lead to digestive discomfort that can make it hard for you to sleep.   Sleep habits  Keep a sleep diary to help you and your health care provider figure out what could be causing your insomnia. Write down: ? When you sleep. ? When you wake up during the night. ? How well you sleep. ? How rested you feel the next day. ? Any side effects of medicines you are taking. ? What you eat and drink.  Make your bedroom a dark, comfortable place where it is easy to fall asleep. ? Put up shades or blackout curtains to block light from outside. ? Use a white noise machine to block noise. ? Keep the temperature cool.  Limit screen use before bedtime. This includes: ? Watching TV. ? Using your smartphone, tablet, or computer.  Stick to a routine that includes going to bed and waking up at the same times every day and night. This can help you fall asleep faster. Consider   making a quiet activity, such as reading, part of your nighttime routine.  Try to avoid taking naps during the day so that you sleep better at night.  Get out of bed if you are still awake after 15 minutes of trying to sleep. Keep the lights down, but try reading or  doing a quiet activity. When you feel sleepy, go back to bed.   General instructions  Take over-the-counter and prescription medicines only as told by your health care provider.  Exercise regularly, as told by your health care provider. Avoid exercise starting several hours before bedtime.  Use relaxation techniques to manage stress. Ask your health care provider to suggest some techniques that may work well for you. These may include: ? Breathing exercises. ? Routines to release muscle tension. ? Visualizing peaceful scenes.  Make sure that you drive carefully. Avoid driving if you feel very sleepy.  Keep all follow-up visits as told by your health care provider. This is important. Contact a health care provider if:  You are tired throughout the day.  You have trouble in your daily routine due to sleepiness.  You continue to have sleep problems, or your sleep problems get worse. Get help right away if:  You have serious thoughts about hurting yourself or someone else. If you ever feel like you may hurt yourself or others, or have thoughts about taking your own life, get help right away. You can go to your nearest emergency department or call:  Your local emergency services (911 in the [place]  A suicide crisis helpline, such as the [facility] at 443-535-5932. This is open 24 hours a day. Summary  Insomnia is a sleep disorder that makes it difficult to fall asleep or stay asleep.  Insomnia can be long-term (chronic) or short-term (acute).  Treatment for insomnia depends on the cause. Treatment may focus on treating an underlying condition that is causing insomnia.  Keep a sleep diary to help you and your health care provider figure out what could be causing your insomnia. This information is not intended to replace advice given to you by your health care provider. Make sure you discuss any questions you have with your health care provider. Document  Revised: 01/03/2020 Document Reviewed: 01/03/2020 Elsevier Patient Education  [place]. Simethicone oral tablets or capsules What is this medicine? SIMETHICONE (sye METH i kone) is used to decrease the discomfort caused by gas. This medicine may be used for other purposes; ask your health care provider or pharmacist if you have questions. COMMON BRAND NAME(S): Gas Free, Gas Relief, Gas-X, Gas-X Extra Strength, Gas-X Ultra Strength, GasAid, Mylanta Gas, Phazyme What should I tell my health care provider before I take this medicine? They need to know if you have any of these conditions:  an unusual or allergic reaction to simethicone, other medicines, foods, dyes, or preservatives  pregnant or trying to get pregnant  breast-feeding How should I use this medicine? Take this medicine by mouth with a glass of water. Follow the directions on the label or those given to you by your doctor or health care professional. Do not take your medicine more often than directed. Talk to your pediatrician regarding the use of this medicine in children. Special care may be needed. While this medicine may be used in children as young as 12 years for selected conditions, precautions do apply. Overdosage: If you think you have taken too much of this medicine contact a poison control center or emergency room  at once. NOTE: This medicine is only for you. Do not share this medicine with others. What if I miss a dose? This does not apply. You will only use this medicine as needed for gas pain. Do not use double or extra doses. What may interact with this medicine? Interactions are not expected. This list may not describe all possible interactions. Give your health care provider a list of all the medicines, herbs, non-prescription drugs, or dietary supplements you use. Also tell them if you smoke, drink alcohol, or use illegal drugs. Some items may interact with your medicine. What should I watch for while  using this medicine? Tell your doctor or health care professional if your symptoms get worse, or if you have severe pain, diarrhea, constipation, or blood in your stool. These could be signs of a more serious condition. What side effects may I notice from receiving this medicine? There are no reported side effects of this medicine. This list may not describe all possible side effects. Call your doctor for medical advice about side effects. You may report side effects to FDA at 1-800-FDA-1088. Where should I keep my medicine? Keep out of the reach of children. Store at room temperature between 15 and 30 degrees C (59 and 86 degrees F). Keep container tightly closed. Throw away any unused medicine after the expiration date. NOTE: This sheet is a summary. It may not cover all possible information. If you have questions about this medicine, talk to your doctor, pharmacist, or health care provider.  2021 Elsevier/Gold Standard (2007-10-27 13:07:08)

## 2020-03-28 LAB — CBC WITH DIFFERENTIAL/PLATELET
Basophils Absolute: 0 10*3/uL (ref 0.0–0.2)
Basos: 0 %
EOS (ABSOLUTE): 0.1 10*3/uL (ref 0.0–0.4)
Eos: 1 %
Hematocrit: 36.1 % — ABNORMAL LOW (ref 37.5–51.0)
Hemoglobin: 12.4 g/dL — ABNORMAL LOW (ref 13.0–17.7)
Immature Grans (Abs): 0 10*3/uL (ref 0.0–0.1)
Immature Granulocytes: 0 %
Lymphocytes Absolute: 2 10*3/uL (ref 0.7–3.1)
Lymphs: 22 %
MCH: 33.9 pg — ABNORMAL HIGH (ref 26.6–33.0)
MCHC: 34.3 g/dL (ref 31.5–35.7)
MCV: 99 fL — ABNORMAL HIGH (ref 79–97)
Monocytes Absolute: 0.4 10*3/uL (ref 0.1–0.9)
Monocytes: 5 %
Neutrophils Absolute: 6.6 10*3/uL (ref 1.4–7.0)
Neutrophils: 72 %
Platelets: 156 10*3/uL (ref 150–450)
RBC: 3.66 x10E6/uL — ABNORMAL LOW (ref 4.14–5.80)
RDW: 12.7 % (ref 11.6–15.4)
WBC: 9.2 10*3/uL (ref 3.4–10.8)

## 2020-03-28 LAB — FERRITIN: Ferritin: 67 ng/mL (ref 30–400)

## 2020-03-31 ENCOUNTER — Other Ambulatory Visit: Payer: Self-pay

## 2020-03-31 ENCOUNTER — Other Ambulatory Visit (INDEPENDENT_AMBULATORY_CARE_PROVIDER_SITE_OTHER): Payer: Medicare HMO

## 2020-03-31 DIAGNOSIS — R194 Change in bowel habit: Secondary | ICD-10-CM | POA: Diagnosis not present

## 2020-03-31 LAB — HEMOCCULT GUIAC POC 1CARD (OFFICE)
Card #2 Fecal Occult Blod, POC: NEGATIVE
Card #3 Fecal Occult Blood, POC: NEGATIVE
Fecal Occult Blood, POC: NEGATIVE

## 2020-04-01 DIAGNOSIS — E611 Iron deficiency: Secondary | ICD-10-CM | POA: Diagnosis not present

## 2020-04-01 DIAGNOSIS — R195 Other fecal abnormalities: Secondary | ICD-10-CM | POA: Diagnosis not present

## 2020-04-02 DIAGNOSIS — I420 Dilated cardiomyopathy: Secondary | ICD-10-CM | POA: Diagnosis not present

## 2020-04-02 DIAGNOSIS — E782 Mixed hyperlipidemia: Secondary | ICD-10-CM | POA: Diagnosis not present

## 2020-04-02 DIAGNOSIS — I5022 Chronic systolic (congestive) heart failure: Secondary | ICD-10-CM | POA: Diagnosis not present

## 2020-04-02 DIAGNOSIS — I340 Nonrheumatic mitral (valve) insufficiency: Secondary | ICD-10-CM | POA: Diagnosis not present

## 2020-04-02 DIAGNOSIS — I4892 Unspecified atrial flutter: Secondary | ICD-10-CM | POA: Diagnosis not present

## 2020-04-04 ENCOUNTER — Other Ambulatory Visit: Payer: Self-pay | Admitting: Family Medicine

## 2020-04-04 DIAGNOSIS — E119 Type 2 diabetes mellitus without complications: Secondary | ICD-10-CM

## 2020-04-07 DIAGNOSIS — E782 Mixed hyperlipidemia: Secondary | ICD-10-CM | POA: Diagnosis not present

## 2020-04-14 DIAGNOSIS — E119 Type 2 diabetes mellitus without complications: Secondary | ICD-10-CM | POA: Diagnosis not present

## 2020-04-14 DIAGNOSIS — H1131 Conjunctival hemorrhage, right eye: Secondary | ICD-10-CM | POA: Diagnosis not present

## 2020-04-14 DIAGNOSIS — Z961 Presence of intraocular lens: Secondary | ICD-10-CM | POA: Diagnosis not present

## 2020-04-14 DIAGNOSIS — Z7984 Long term (current) use of oral hypoglycemic drugs: Secondary | ICD-10-CM | POA: Diagnosis not present

## 2020-04-18 DIAGNOSIS — E1165 Type 2 diabetes mellitus with hyperglycemia: Secondary | ICD-10-CM | POA: Diagnosis not present

## 2020-04-18 DIAGNOSIS — N184 Chronic kidney disease, stage 4 (severe): Secondary | ICD-10-CM | POA: Diagnosis not present

## 2020-04-18 DIAGNOSIS — I10 Essential (primary) hypertension: Secondary | ICD-10-CM | POA: Diagnosis not present

## 2020-04-18 DIAGNOSIS — E782 Mixed hyperlipidemia: Secondary | ICD-10-CM | POA: Diagnosis not present

## 2020-04-21 DIAGNOSIS — R262 Difficulty in walking, not elsewhere classified: Secondary | ICD-10-CM | POA: Diagnosis not present

## 2020-04-21 DIAGNOSIS — R413 Other amnesia: Secondary | ICD-10-CM | POA: Diagnosis not present

## 2020-04-21 DIAGNOSIS — G479 Sleep disorder, unspecified: Secondary | ICD-10-CM | POA: Diagnosis not present

## 2020-04-24 DIAGNOSIS — R262 Difficulty in walking, not elsewhere classified: Secondary | ICD-10-CM | POA: Insufficient documentation

## 2020-04-24 DIAGNOSIS — G479 Sleep disorder, unspecified: Secondary | ICD-10-CM | POA: Insufficient documentation

## 2020-05-16 ENCOUNTER — Other Ambulatory Visit: Payer: Self-pay | Admitting: Family Medicine

## 2020-05-16 DIAGNOSIS — E1169 Type 2 diabetes mellitus with other specified complication: Secondary | ICD-10-CM

## 2020-05-20 ENCOUNTER — Ambulatory Visit: Payer: Self-pay | Admitting: Family Medicine

## 2020-05-21 ENCOUNTER — Other Ambulatory Visit: Payer: Self-pay | Admitting: Family Medicine

## 2020-05-21 DIAGNOSIS — E119 Type 2 diabetes mellitus without complications: Secondary | ICD-10-CM

## 2020-05-21 NOTE — Telephone Encounter (Signed)
Requested Prescriptions  Pending Prescriptions Disp Refills  . ONETOUCH ULTRA test strip [employer] Med Name: ONE TOUCH ULTRA BLUE TEST STRP] 100 strip 0    Sig: USE TO TEST ONCE A DAY     Endocrinology: Diabetes - Testing Supplies Passed - 05/21/2020  1:25 AM      Passed - Valid encounter within last 12 months    Recent Outpatient Visits          1 month ago Iron deficiency anemia secondary to inadequate dietary iron intake   [facility] [name], MD   3 months ago Type 2 diabetes mellitus without complication, without long-term current use of insulin ([place])   [facility] [name], MD   4 months ago Type 2 diabetes mellitus without complication, without long-term current use of insulin ([place])   [facility] [name], MD   7 months ago Type 2 diabetes mellitus without complication, without long-term current use of insulin ([place])   [facility] [name], MD   11 months ago Malaise and fatigue   [facility] [name], MD      Future Appointments            In 2 weeks [name], MD [facility], [facility]

## 2020-06-04 ENCOUNTER — Ambulatory Visit (INDEPENDENT_AMBULATORY_CARE_PROVIDER_SITE_OTHER): Payer: Medicare HMO | Admitting: Family Medicine

## 2020-06-04 ENCOUNTER — Other Ambulatory Visit: Payer: Self-pay

## 2020-06-04 ENCOUNTER — Encounter: Payer: Self-pay | Admitting: Family Medicine

## 2020-06-04 VITALS — BP 120/64 | HR 60 | Ht 71.0 in | Wt 192.0 lb

## 2020-06-04 DIAGNOSIS — I429 Cardiomyopathy, unspecified: Secondary | ICD-10-CM

## 2020-06-04 DIAGNOSIS — L723 Sebaceous cyst: Secondary | ICD-10-CM | POA: Diagnosis not present

## 2020-06-04 DIAGNOSIS — E1169 Type 2 diabetes mellitus with other specified complication: Secondary | ICD-10-CM | POA: Diagnosis not present

## 2020-06-04 DIAGNOSIS — I10 Essential (primary) hypertension: Secondary | ICD-10-CM

## 2020-06-04 DIAGNOSIS — E785 Hyperlipidemia, unspecified: Secondary | ICD-10-CM

## 2020-06-04 DIAGNOSIS — N183 Chronic kidney disease, stage 3 unspecified: Secondary | ICD-10-CM

## 2020-06-04 DIAGNOSIS — R69 Illness, unspecified: Secondary | ICD-10-CM | POA: Diagnosis not present

## 2020-06-04 DIAGNOSIS — F329 Major depressive disorder, single episode, unspecified: Secondary | ICD-10-CM

## 2020-06-04 MED ORDER — LOSARTAN POTASSIUM 100 MG PO TABS: 50.0000 mg | ORAL_TABLET | Freq: Every day | ORAL | 1 refills | Status: DC

## 2020-06-04 MED ORDER — CARVEDILOL 3.125 MG PO TABS
3.1250 mg | ORAL_TABLET | Freq: Two times a day (BID) | ORAL | 1 refills | Status: DC
Start: 2020-06-04 — End: 2021-01-23

## 2020-06-04 MED ORDER — CEPHALEXIN 500 MG PO CAPS: 500.0000 mg | ORAL_CAPSULE | Freq: Three times a day (TID) | ORAL | 0 refills | Status: DC

## 2020-06-04 MED ORDER — SIMVASTATIN 80 MG PO TABS: 80.0000 mg | ORAL_TABLET | Freq: Every evening | ORAL | 1 refills | Status: DC

## 2020-06-04 MED ORDER — SERTRALINE HCL 25 MG PO TABS: 25.0000 mg | ORAL_TABLET | Freq: Every day | ORAL | 1 refills | Status: DC

## 2020-06-04 NOTE — Progress Notes (Signed)
Date:  06/04/2020   Name:  Cody Howard   DOB:  [birth year]   MRN:  [id number]   Chief Complaint: Hypertension and Hyperlipidemia  Hypertension This is a chronic problem. The current episode started more than 1 year ago. The problem has been gradually improving since onset. The problem is controlled. Pertinent negatives include no anxiety, blurred vision, chest pain, headaches, malaise/fatigue, neck pain, orthopnea, palpitations, peripheral edema, PND, shortness of breath or sweats. There are no associated agents to hypertension. There are no known risk factors for coronary artery disease. Past treatments include diuretics, beta blockers, alpha 1 blockers and angiotensin blockers. The current treatment provides moderate improvement. There are no compliance problems.  There is no history of angina, kidney disease, CAD/MI, CVA, heart failure, left ventricular hypertrophy, PVD or retinopathy. There is no history of chronic renal disease, a hypertension causing med or renovascular disease.  Hyperlipidemia This is a chronic problem. The current episode started more than 1 year ago. The problem is controlled. Recent lipid tests were reviewed and are normal. Exacerbating diseases include diabetes. He has no history of chronic renal disease, hypothyroidism, liver disease, obesity or nephrotic syndrome. Pertinent negatives include no chest pain, focal sensory loss, focal weakness, leg pain, myalgias or shortness of breath. Current antihyperlipidemic treatment includes statins. The current treatment provides moderate improvement of lipids. There are no compliance problems.   Depression        This is a recurrent problem.  The onset quality is gradual.   Associated symptoms include irritable, restlessness, decreased interest and sad.  Associated symptoms include no decreased concentration, no fatigue, no helplessness, no hopelessness, does not have insomnia, no appetite change, no body aches, no myalgias, no  headaches, no indigestion and no suicidal ideas.  Past treatments include SSRIs - Selective serotonin reuptake inhibitors (resume previous sertraline).   Pertinent negatives include no hypothyroidism and no anxiety.   Lab Results  Component Value Date   CREATININE 2.28 (H) 01/11/2020   BUN 35 (H) 01/11/2020   NA 144 01/11/2020   K 5.1 01/11/2020   CL 107 (H) 01/11/2020   CO2 25 01/11/2020   Lab Results  Component Value Date   CHOL 257 (H) 03/12/2019   HDL 59 03/12/2019   LDLCALC 167 (H) 03/12/2019   TRIG 172 (H) 03/12/2019   CHOLHDL 2.3 11/29/2016   Lab Results  Component Value Date   TSH 2.710 05/31/2019   Lab Results  Component Value Date   HGBA1C 8.0 (H) 01/11/2020   Lab Results  Component Value Date   WBC 9.2 03/27/2020   HGB 12.4 (L) 03/27/2020   HCT 36.1 (L) 03/27/2020   MCV 99 (H) 03/27/2020   PLT 156 03/27/2020   Lab Results  Component Value Date   ALT 15 01/01/2019   AST 21 01/01/2019   ALKPHOS 82 01/01/2019   BILITOT 0.7 01/01/2019     Review of Systems  Constitutional: Negative for appetite change, chills, fatigue, fever and malaise/fatigue.  HENT: Negative for drooling, ear discharge, ear pain and sore throat.   Eyes: Negative for blurred vision.  Respiratory: Negative for cough, shortness of breath and wheezing.   Cardiovascular: Negative for chest pain, palpitations, orthopnea, leg swelling and PND.  Gastrointestinal: Negative for abdominal pain, blood in stool, constipation, diarrhea and nausea.  Endocrine: Negative for polydipsia.  Genitourinary: Negative for dysuria, frequency, hematuria and urgency.  Musculoskeletal: Negative for back pain, myalgias and neck pain.  Skin: Negative for rash.  Allergic/Immunologic: Negative for  environmental allergies.  Neurological: Negative for dizziness, focal weakness and headaches.  Hematological: Does not bruise/bleed easily.  Psychiatric/Behavioral: Positive for depression. Negative for decreased  concentration and suicidal ideas. The patient is not nervous/anxious and does not have insomnia.     Patient Active Problem List   Diagnosis Date Noted  . Moderate mitral regurgitation 09/12/2019  . Status post hip hemiarthroplasty 06/28/2019  . Atrial fibrillation, chronic ([place]) 06/27/2019  . Depression 06/27/2019  . Fall at home, initial encounter 06/27/2019  . Closed displaced fracture of left femoral neck ([place]) 06/27/2019  . History of anemia 05/31/2019  . Benign prostatic hyperplasia with lower urinary tract symptoms 05/31/2019  . Cerebral atrophy, mild ([place]) 05/31/2019  . Reactive depression 05/31/2019  . Essential hypertension 05/31/2019  . Hiatal hernia 05/31/2019  . Cardiac syncope 02/20/2019  . Loss of memory 05/25/2018  . Dizziness 04/12/2018  . TIA (transient ischemic attack) 01/25/2018  . Chronic systolic CHF (congestive heart failure), NYHA class 3 ([place]) 11/29/2017  . CKD (chronic kidney disease) stage 3, GFR 30-59 ml/min (HCC) 08/18/2017  . Bradycardia 08/18/2017  . Atrial flutter, paroxysmal ([place]) 07/21/2017  . Functional dyspnea 07/06/2017  . Cardiomyopathy ([place]) 07/06/2017  . Hyperlipidemia, mixed 06/20/2017  . Hyperlipidemia associated with type 2 diabetes mellitus ([place]) 04/06/2016  . Type II diabetes mellitus with renal manifestations ([place]) 02/10/2015  . Personal history of other diseases of male genital organs 02/10/2015  . Sebaceous cyst 08/28/2014    No Known Allergies  Past Surgical History:  Procedure Laterality Date  . COLONOSCOPY N/A 02/26/2015   Procedure: COLONOSCOPY;  Surgeon: Hulen Luster, MD;  Location: [facility];  Service: Gastroenterology;  Laterality: N/A;  . COLONOSCOPY WITH PROPOFOL N/A 11/02/2017   Procedure: COLONOSCOPY WITH PROPOFOL;  Surgeon: Toledo, Benay Pike, MD;  Location: [facility];  Service: Gastroenterology;  Laterality: N/A;  . ESOPHAGOGASTRODUODENOSCOPY N/A 02/26/2015   Procedure: ESOPHAGOGASTRODUODENOSCOPY (EGD);   Surgeon: Hulen Luster, MD;  Location: [facility];  Service: Gastroenterology;  Laterality: N/A;  Diabetic - oral meds  . ESOPHAGOGASTRODUODENOSCOPY (EGD) WITH PROPOFOL N/A 11/02/2017   Procedure: ESOPHAGOGASTRODUODENOSCOPY (EGD) WITH PROPOFOL;  Surgeon: Toledo, Benay Pike, MD;  Location: [facility];  Service: Gastroenterology;  Laterality: N/A;  . HIP ARTHROPLASTY Left 06/28/2019   Procedure: ARTHROPLASTY BIPOLAR HIP (HEMIARTHROPLASTY);  Surgeon: Corky Mull, MD;  Location: [facility];  Service: Orthopedics;  Laterality: Left;  . KIDNEY SURGERY Right    surgery when 86 years old  . POLYPECTOMY  02/26/2015   Procedure: POLYPECTOMY;  Surgeon: Hulen Luster, MD;  Location: [facility];  Service: Gastroenterology;;  . sebaceous cyst removal Right    located right of spine on upper back    Social History   Tobacco Use  . Smoking status: Former Smoker    Quit date: 03/08/1978    Years since quitting: 42.2  . Smokeless tobacco: Never Used  Vaping Use  . Vaping Use: Never used  Substance Use Topics  . Alcohol use: Yes    Alcohol/week: 0.0 standard drinks    Comment: Rarely  . Drug use: No     Medication list has been reviewed and updated.  Current Meds  Medication Sig  . apixaban (ELIQUIS) 2.5 MG TABS tablet Take 1 tablet (2.5 mg total) by mouth 2 (two) times daily.  . carbamide peroxide (DEBROX) 6.5 % OTIC solution Place 5 drops into the right ear 2 (two) times daily.  . carvedilol (COREG) 3.125 MG tablet Take 1 tablet (3.125 mg  total) by mouth 2 (two) times daily with a meal.  . donepezil (ARICEPT) 10 MG tablet Take 1 tablet (10 mg total) by mouth daily. Chipper Herb  . ferrous sulfate 325 (65 FE) MG tablet Take 325 mg by mouth daily with breakfast.  . furosemide (LASIX) 20 MG tablet Take 1 tablet (20 mg total) by mouth daily.  Marland Kitchen glipiZIDE (GLUCOTROL) 5 MG tablet TAKE 1 TABLET (5 MG TOTAL) BY MOUTH 2 (TWO) TIMES DAILY BEFORE A MEAL.  Marland Kitchen ibuprofen (ADVIL) 200 MG tablet Take  200 mg by mouth every 6 (six) hours as needed.  . Lancets (ONETOUCH DELICA PLUS [id number]) MISC USE TO TEST ONCE DAILY  . losartan (COZAAR) 100 MG tablet TAKE 1/2 TABLET BY MOUTH DAILY  . ONETOUCH ULTRA test strip USE TO TEST ONCE A DAY  . simvastatin (ZOCOR) 80 MG tablet TAKE 1 TABLET BY MOUTH EVERY EVENING    PHQ 2/9 Scores 06/04/2020 01/28/2020 07/23/2019 05/31/2019  PHQ - 2 Score 1 1 0 2  PHQ- 9 Score 3 4 - 4    GAD 7 : Generalized Anxiety Score 06/04/2020 05/31/2019  Nervous, Anxious, on Edge 2 0  Control/stop worrying 2 0  Worry too much - different things 0 0  Trouble relaxing 0 0  Restless 0 1  Easily annoyed or irritable 0 1  Afraid - awful might happen 0 0  Total GAD 7 Score 4 2  Anxiety Difficulty Somewhat difficult Not difficult at all    BP Readings from Last 3 Encounters:  06/04/20 120/64  03/27/20 (!) 162/72  02/14/20 120/70    Physical Exam Vitals and nursing note reviewed.  Constitutional:      General: He is irritable.  HENT:     Head: Normocephalic.     Right Ear: Tympanic membrane and external ear normal.     Left Ear: Tympanic membrane and external ear normal.     Nose: Nose normal.     Mouth/Throat:     Mouth: Mucous membranes are moist.     Pharynx: Oropharynx is clear. Uvula midline.  Eyes:     General: No scleral icterus.       Right eye: No discharge.        Left eye: No discharge.     Conjunctiva/sclera: Conjunctivae normal.     Pupils: Pupils are equal, round, and reactive to light.  Neck:     Thyroid: No thyromegaly.     Vascular: No JVD.     Trachea: No tracheal deviation.  Cardiovascular:     Rate and Rhythm: Normal rate and regular rhythm.     Pulses: Normal pulses.     Heart sounds: Normal heart sounds, S1 normal and S2 normal. No murmur heard.  No systolic murmur is present.  No diastolic murmur is present. No friction rub. No gallop. No S3 or S4 sounds.   Pulmonary:     Effort: No respiratory distress.     Breath sounds:  Normal breath sounds. No wheezing or rales.  Abdominal:     General: Bowel sounds are normal.     Palpations: Abdomen is soft. There is no mass.     Tenderness: There is no abdominal tenderness. There is no guarding or rebound.  Musculoskeletal:        General: No tenderness. Normal range of motion.     Cervical back: Normal range of motion and neck supple.  Lymphadenopathy:     Cervical: No cervical adenopathy.  Skin:    General:  Skin is warm.     Findings: No rash.  Neurological:     Mental Status: He is alert and oriented to person, place, and time.     Cranial Nerves: No cranial nerve deficit.     Deep Tendon Reflexes: Reflexes are normal and symmetric.     Wt Readings from Last 3 Encounters:  06/04/20 192 lb (87.1 kg)  03/27/20 189 lb (85.7 kg)  02/14/20 186 lb (84.4 kg)    BP 120/64   Pulse 60   Ht 5\' 11"  (1.803 m)   Wt 192 lb (87.1 kg)   BMI 26.78 kg/m    Assessment and Plan: 1. Essential hypertension Chronic.  Controlled.  Stable.  Blood pressure today is 120/64.  We will continue carvedilol 3.125 daily and losartan 100 mg once a day. - carvedilol (COREG) 3.125 MG tablet; Take 1 tablet (3.125 mg total) by mouth 2 (two) times daily with a meal.  Dispense: 180 tablet; Refill: 1 - Renal Function Panel  2. Cardiomyopathy, unspecified type ([place]) Chronic.  Controlled.  Stable.  Followed by Dr. Nehemiah Massed in cardiology.  Continue carvedilol and losartan as above but dosing. - carvedilol (COREG) 3.125 MG tablet; Take 1 tablet (3.125 mg total) by mouth 2 (two) times daily with a meal.  Dispense: 180 tablet; Refill: 1 - losartan (COZAAR) 100 MG tablet; Take 0.5 tablets (50 mg total) by mouth daily.  Dispense: 45 tablet; Refill: 1  3. CKD (chronic kidney disease) stage 3, GFR 30-59 ml/min (HCC) Chronic.  Controlled.  Stable.  She is followed by Dr. Holley Raring nephrology.  Continue losartan 100 mg once a day. - losartan (COZAAR) 100 MG tablet; Take 0.5 tablets (50 mg total) by  mouth daily.  Dispense: 45 tablet; Refill: 1  4. Hyperlipidemia associated with type 2 diabetes mellitus (HCC) Chronic.  Controlled.  Stable.  Continue simvastatin 80 mg once a day. - simvastatin (ZOCOR) 80 MG tablet; Take 1 tablet (80 mg total) by mouth every evening.  Dispense: 90 tablet; Refill: 1  5. Reactive depression New reonset.  Due to reading stress of his spouse medical circumstance patient has had a recurrence of his depression.  PHQ is 3 with a gad score of 4.  We will resume sertraline but at 25 mg once a day we will recheck in 6 to 8 weeks for control status. - sertraline (ZOLOFT) 25 MG tablet; Take 1 tablet (25 mg total) by mouth daily.  Dispense: 90 tablet; Refill: 1  6. Sebaceous cyst New onset.  Secondarily infected mildly.  There is a sebaceous cyst in the lower back.  The is consistent with a secondary infection.  Will initiate cephalexin 500 mg 3 times a day for a week and referral has been made to general surgery for excision. - cephALEXin (KEFLEX) 500 MG capsule; Take 1 capsule (500 mg total) by mouth 3 (three) times daily.  Dispense: 21 capsule; Refill: 0 - Ambulatory referral to General Surgery

## 2020-06-05 LAB — RENAL FUNCTION PANEL
Albumin: 4.1 g/dL (ref 3.6–4.6)
BUN/Creatinine Ratio: 17 (ref 10–24)
BUN: 39 mg/dL — ABNORMAL HIGH (ref 8–27)
CO2: 17 mmol/L — ABNORMAL LOW (ref 20–29)
Calcium: 8.8 mg/dL (ref 8.6–10.2)
Chloride: 107 mmol/L — ABNORMAL HIGH (ref 96–106)
Creatinine, Ser: 2.3 mg/dL — ABNORMAL HIGH (ref 0.76–1.27)
Glucose: 98 mg/dL (ref 65–99)
Phosphorus: 3.6 mg/dL (ref 2.8–4.1)
Potassium: 5.7 mmol/L — ABNORMAL HIGH (ref 3.5–5.2)
Sodium: 141 mmol/L (ref 134–144)
eGFR: 27 mL/min/{1.73_m2} — ABNORMAL LOW (ref >59)

## 2020-06-11 ENCOUNTER — Telehealth: Payer: Self-pay

## 2020-06-11 NOTE — Telephone Encounter (Unsigned)
Copied from [place] [id number]. Topic: General - Call Back - No Documentation >> Jun 11, 2020 11:48 AM Erick Blinks wrote: Reason for CRM: Pt is requesting a call back from Baxter Flattery, did not disclose why when asked  Best contact: [phone]

## 2020-06-11 NOTE — Telephone Encounter (Signed)
Moved appt up to Thursday 06/12/20 with Dr Christian Mate @ 2:30 in [place] for cyst I and D- spoke to [place] and pt

## 2020-06-11 NOTE — Telephone Encounter (Signed)
Called pt and cyst is draining per CNA staying with them. I called and was able to get appt moved up to tomorrow the 7th @ 2:30 with Dr Christian Mate in [place]. Spoke to [employer] daughter and pt to give information.

## 2020-06-12 ENCOUNTER — Other Ambulatory Visit: Payer: Self-pay

## 2020-06-12 ENCOUNTER — Ambulatory Visit: Payer: Medicare HMO | Admitting: Surgery

## 2020-06-12 VITALS — BP 149/68 | HR 61 | Temp 98.5°F | Ht 71.0 in | Wt 192.0 lb

## 2020-06-12 DIAGNOSIS — Z7189 Other specified counseling: Secondary | ICD-10-CM

## 2020-06-12 DIAGNOSIS — L723 Sebaceous cyst: Secondary | ICD-10-CM | POA: Diagnosis not present

## 2020-06-12 MED ORDER — TRIAMCINOLONE ACETONIDE 40 MG/ML IJ SUSP
40.0000 mg | Freq: Once | INTRAMUSCULAR | Status: AC
  Administered 2020-06-12: 40 mg via INTRAMUSCULAR

## 2020-06-12 NOTE — Patient Instructions (Addendum)
You may shower as normal and allow warm soapy water to run over it, rinse well and pat dry. Your follow up appointment is listed below. Please give [place] a call if you have any questions or concerns.

## 2020-06-12 NOTE — Progress Notes (Signed)
Patient ID: Cody Howard, male   DOB: Dec 05, 1934, 86 y.o.   MRN: [id number]  Chief Complaint: Lower back draining cyst/abscess  History of Present Illness Cody Howard is a 86 y.o. male with a sebaceous cyst which became significantly inflamed and angry, was started on antibiotics about 8 days ago.  The cyst/raised area first noted March 28, 2 days later began draining and with the drainage, slight improvement.  Presents today for wound care.  He takes Eliquis.  He has been tolerating Keflex well.  Past Medical History Past Medical History:  Diagnosis Date  . AF (atrial fibrillation) ([place])   . Arthritis    hands  . Chronic kidney disease   . Diabetes mellitus without complication ([place])   . Hyperlipidemia   . Hypertension   . Prostate enlargement       Past Surgical History:  Procedure Laterality Date  . COLONOSCOPY N/A 02/26/2015   Procedure: COLONOSCOPY;  Surgeon: Hulen Luster, MD;  Location: [facility];  Service: Gastroenterology;  Laterality: N/A;  . COLONOSCOPY WITH PROPOFOL N/A 11/02/2017   Procedure: COLONOSCOPY WITH PROPOFOL;  Surgeon: Toledo, Benay Pike, MD;  Location: [facility];  Service: Gastroenterology;  Laterality: N/A;  . ESOPHAGOGASTRODUODENOSCOPY N/A 02/26/2015   Procedure: ESOPHAGOGASTRODUODENOSCOPY (EGD);  Surgeon: Hulen Luster, MD;  Location: [facility];  Service: Gastroenterology;  Laterality: N/A;  Diabetic - oral meds  . ESOPHAGOGASTRODUODENOSCOPY (EGD) WITH PROPOFOL N/A 11/02/2017   Procedure: ESOPHAGOGASTRODUODENOSCOPY (EGD) WITH PROPOFOL;  Surgeon: Toledo, Benay Pike, MD;  Location: [facility];  Service: Gastroenterology;  Laterality: N/A;  . HIP ARTHROPLASTY Left 06/28/2019   Procedure: ARTHROPLASTY BIPOLAR HIP (HEMIARTHROPLASTY);  Surgeon: Corky Mull, MD;  Location: [facility];  Service: Orthopedics;  Laterality: Left;  . KIDNEY SURGERY Right    surgery when 86 years old  . POLYPECTOMY  02/26/2015   Procedure: POLYPECTOMY;  Surgeon:  Hulen Luster, MD;  Location: [facility];  Service: Gastroenterology;;  . sebaceous cyst removal Right    located right of spine on upper back    No Known Allergies  Current Outpatient Medications  Medication Sig Dispense Refill  . apixaban (ELIQUIS) 2.5 MG TABS tablet Take 1 tablet (2.5 mg total) by mouth 2 (two) times daily. 28 tablet 0  . carbamide peroxide (DEBROX) 6.5 % OTIC solution Place 5 drops into the right ear 2 (two) times daily. 15 mL 0  . carvedilol (COREG) 3.125 MG tablet Take 1 tablet (3.125 mg total) by mouth 2 (two) times daily with a meal. 180 tablet 1  . cephALEXin (KEFLEX) 500 MG capsule Take 1 capsule (500 mg total) by mouth 3 (three) times daily. 21 capsule 0  . donepezil (ARICEPT) 10 MG tablet Take 1 tablet (10 mg total) by mouth daily. Chipper Herb 30 tablet 0  . ferrous sulfate 325 (65 FE) MG tablet Take 325 mg by mouth daily with breakfast.    . furosemide (LASIX) 20 MG tablet Take 1 tablet (20 mg total) by mouth daily. 90 tablet 1  . glipiZIDE (GLUCOTROL) 5 MG tablet TAKE 1 TABLET (5 MG TOTAL) BY MOUTH 2 (TWO) TIMES DAILY BEFORE A MEAL. 180 tablet 0  . ibuprofen (ADVIL) 200 MG tablet Take 200 mg by mouth every 6 (six) hours as needed.    . Lancets (ONETOUCH DELICA PLUS [id number]) MISC USE TO TEST ONCE DAILY 100 each 0  . losartan (COZAAR) 100 MG tablet Take 0.5 tablets (50 mg total) by mouth daily. 45 tablet 1  .  Melatonin 5 MG TABS Take 5 mg by mouth at bedtime.    Glory Rosebush ULTRA test strip USE TO TEST ONCE A DAY 100 strip 0  . sertraline (ZOLOFT) 25 MG tablet Take 1 tablet (25 mg total) by mouth daily. 90 tablet 1  . simvastatin (ZOCOR) 80 MG tablet Take 1 tablet (80 mg total) by mouth every evening. 90 tablet 1   No current facility-administered medications for this visit.    Family History Family History  Problem Relation Age of Onset  . Diabetes Father   . Heart disease Father       Social History Social History   Tobacco Use  . Smoking  status: Former Smoker    Quit date: 03/08/1978    Years since quitting: 42.2  . Smokeless tobacco: Never Used  Vaping Use  . Vaping Use: Never used  Substance Use Topics  . Alcohol use: Yes    Alcohol/week: 0.0 standard drinks    Comment: Rarely  . Drug use: No        Review of Systems  Constitutional: Negative for chills and fever.  HENT: Negative.   Eyes: Negative.   Respiratory: Negative.   Cardiovascular: Negative.   Gastrointestinal: Positive for diarrhea. Negative for nausea.  Genitourinary: Negative.   Skin: Negative.   Neurological: Negative.   Psychiatric/Behavioral: Negative.       Physical Exam Blood pressure (!) 149/68, pulse 61, temperature 98.5 F (36.9 C), temperature source Oral, height 5\' 11"  (1.803 m), weight 192 lb (87.1 kg), SpO2 98 %. Last Weight  Most recent update: 06/12/2020  3:16 PM   Weight  87.1 kg (192 lb)            CONSTITUTIONAL: Well developed, and nourished, appropriately responsive and aware without distress.   EYES: Sclera non-icteric.   EARS, NOSE, MOUTH AND THROAT: Mask worn.    Hearing is intact to voice.  MUSCULOSKELETAL:  Symmetrical muscle tone appreciated in all four extremities.    SKIN: Skin turgor is normal. No pathologic skin lesions appreciated.   Mid back at the level of the lower lumbar/sacral junction there is a draining dermal lesion that is raised from the vertebral groove, with multiple openings there is some semipurulent drainage.  Hyperemia around it probably extends for about a centimeter and 1/2 to 2 cm in diameter.  Upon gentle pressure around the perimeter beyond the redness, encourages some of the sebaceous drainage to come out through the openings. We prepped the area with Betadine and with local anesthesia of 1% lidocaine 5 mL mixed with 1 mL of Kenalog.  I infiltrated the region circumferentially.  I then utilized an 11 blade to extend the largest opening to another adjacent opening and bowel allow adequate  access for a bone curette.  Utilizing one of the larger bone curettes we then scooped out the contents from within the cystic cavity.  This was well-tolerated and we proceeded with excisional debridement until there is no further remnant of cyst appreciable nor sebaceous material.  This was well-tolerated.  It was irrigated with sterile saline, and a sterile dry dressing applied.  NEUROLOGIC:  Motor and sensation appear grossly normal.  Cranial nerves are grossly without defect. PSYCH:  Alert and oriented to person, place and time. Affect is appropriate for situation.  Data Reviewed I have personally reviewed what is currently available of the patient's imaging, recent labs and medical records.   Labs:  CBC Latest Ref Rng & Units 03/27/2020 07/03/2019 06/30/2019  WBC  3.4 - 10.8 x10E3/uL 9.2 11.9(H) 11.8(H)  Hemoglobin 13.0 - 17.7 g/dL 12.4(L) 11.3(L) 9.4(L)  Hematocrit 37.5 - 51.0 % 36.1(L) 31.8(L) 26.7(L)  Platelets 150 - 450 x10E3/uL 156 175 122(L)   CMP Latest Ref Rng & Units 06/04/2020 01/11/2020 07/03/2019  Glucose 65 - 99 mg/dL 98 194(H) 200(H)  BUN 8 - 27 mg/dL 39(H) 35(H) 35(H)  Creatinine 0.76 - 1.27 mg/dL 2.30(H) 2.28(H) 1.55(H)  Sodium 134 - 144 mmol/L 141 144 137  Potassium 3.5 - 5.2 mmol/L 5.7(H) 5.1 4.2  Chloride 96 - 106 mmol/L 107(H) 107(H) 104  CO2 20 - 29 mmol/L 17(L) 25 23  Calcium 8.6 - 10.2 mg/dL 8.8 8.9 8.6(L)  Total Protein 6.5 - 8.1 g/dL - - -  Total Bilirubin 0.3 - 1.2 mg/dL - - -  Alkaline Phos 38 - 126 U/L - - -  AST 15 - 41 U/L - - -  ALT 0 - 44 U/L - - -      Imaging:  Within last 24 hrs: No results found.  Assessment    Infected sebaceous cyst/ruptured sebaceous cyst with inflammation.  Now better drained/enucleated. Patient Active Problem List   Diagnosis Date Noted  . Moderate mitral regurgitation 09/12/2019  . Status post hip hemiarthroplasty 06/28/2019  . Atrial fibrillation, chronic ([place]) 06/27/2019  . Depression 06/27/2019  . Fall at home,  initial encounter 06/27/2019  . Closed displaced fracture of left femoral neck ([place]) 06/27/2019  . History of anemia 05/31/2019  . Benign prostatic hyperplasia with lower urinary tract symptoms 05/31/2019  . Cerebral atrophy, mild ([place]) 05/31/2019  . Reactive depression 05/31/2019  . Essential hypertension 05/31/2019  . Hiatal hernia 05/31/2019  . Cardiac syncope 02/20/2019  . Loss of memory 05/25/2018  . Dizziness 04/12/2018  . TIA (transient ischemic attack) 01/25/2018  . Chronic systolic CHF (congestive heart failure), NYHA class 3 ([place]) 11/29/2017  . CKD (chronic kidney disease) stage 3, GFR 30-59 ml/min (HCC) 08/18/2017  . Bradycardia 08/18/2017  . Atrial flutter, paroxysmal ([place]) 07/21/2017  . Functional dyspnea 07/06/2017  . Cardiomyopathy ([place]) 07/06/2017  . Hyperlipidemia, mixed 06/20/2017  . Hyperlipidemia associated with type 2 diabetes mellitus ([place]) 04/06/2016  . Type II diabetes mellitus with renal manifestations ([place]) 02/10/2015  . Personal history of other diseases of male genital organs 02/10/2015  . Sebaceous cyst 08/28/2014    Plan    Complete the course of antibiotics.  Change dressings as needed, shower every other day and cleanse wound.  Probe wound with Q-tip after showering and reapplication of new dressing. We will follow him up together along with his wife on their next visit.  Face-to-face time spent with the patient and accompanying care providers(if present) was 20 minutes, with more than 50% of the time spent counseling, educating, and coordinating care of the patient.      Ronny Bacon M.D., FACS 06/12/2020, 3:23 PM

## 2020-06-16 ENCOUNTER — Ambulatory Visit: Payer: Self-pay | Admitting: Surgery

## 2020-06-19 ENCOUNTER — Ambulatory Visit (INDEPENDENT_AMBULATORY_CARE_PROVIDER_SITE_OTHER): Payer: Medicare HMO | Admitting: Surgery

## 2020-06-19 ENCOUNTER — Other Ambulatory Visit: Payer: Self-pay

## 2020-06-19 ENCOUNTER — Encounter: Payer: Self-pay | Admitting: Surgery

## 2020-06-19 VITALS — BP 124/68 | HR 51 | Temp 98.1°F | Ht 70.5 in | Wt 188.8 lb

## 2020-06-19 DIAGNOSIS — Z48817 Encounter for surgical aftercare following surgery on the skin and subcutaneous tissue: Secondary | ICD-10-CM

## 2020-06-19 DIAGNOSIS — L723 Sebaceous cyst: Secondary | ICD-10-CM

## 2020-06-19 NOTE — Patient Instructions (Addendum)
Once a day wet a cotton swab applicator (q-tip) and clean the site. Be sure to wet with peroxide first. Please give [place] a call if you have any questions or concerns. See follow up appointment below.

## 2020-06-20 NOTE — Progress Notes (Signed)
Cody Howard returns today for follow-up wound care after his incision and drainage of his abscess/ruptured sebaceous cyst of his lower back. I took off the dressing and found somewhat crusted covering over the opening.  This site has diminished in size and swelling.  The redness is nearly completely resolved.  I picked off the scab, applied some lidocaine cream, and utilizing a bone curette proceeded with excisional debridement of the skin and subcutaneous tissues of this cavity.  I did find some additional sebaceous cystic material, and lining.  Due to his anticoagulation status, we did have expected bleeding from the wound which was well controlled with pressure.  However also because of this we did not get any more aggressive. I gave instructions to the caregiver to probe the wound with a hydrogen peroxide moistened Q-tip on a daily basis.  This should help to keep the wound open, and I would anticipate a weekly excisional debridement until I see further progress of the with this.  There does not appear to be any active infection at this time.

## 2020-06-25 ENCOUNTER — Ambulatory Visit: Payer: Medicare HMO | Admitting: Family Medicine

## 2020-06-26 ENCOUNTER — Encounter: Payer: Self-pay | Admitting: Surgery

## 2020-06-26 ENCOUNTER — Ambulatory Visit (INDEPENDENT_AMBULATORY_CARE_PROVIDER_SITE_OTHER): Payer: Medicare HMO | Admitting: Surgery

## 2020-06-26 ENCOUNTER — Other Ambulatory Visit: Payer: Self-pay

## 2020-06-26 VITALS — BP 142/73 | HR 56 | Temp 98.3°F | Ht 70.5 in | Wt 189.0 lb

## 2020-06-26 DIAGNOSIS — L723 Sebaceous cyst: Secondary | ICD-10-CM | POA: Diagnosis not present

## 2020-06-26 NOTE — Patient Instructions (Signed)
Keep a bandage over the area until it stops draining and closes. Change this daily.   Follow-up with our office as needed.  Please call and ask to speak with a nurse if you develop questions or concerns.

## 2020-06-26 NOTE — Progress Notes (Signed)
Here for follow-up regarding small wound in the lower back after multiple excisional debridements for an draining ruptured sebaceous cyst.  They are continuing to keep the wound open with cleansing Q-tip application.  There is no significant drainage. With topical application of Xylocaine jelly, I utilized a bone curette to explore the wound and excised/debrided any residual cystic remnant.  None was identified. With this completed I suggested they no longer need further follow-up for this lesion anticipating its eminent healing.  We will be glad to see him back as needed.

## 2020-06-29 ENCOUNTER — Encounter: Payer: Self-pay | Admitting: Family Medicine

## 2020-06-30 ENCOUNTER — Other Ambulatory Visit: Payer: Self-pay | Admitting: Family Medicine

## 2020-06-30 DIAGNOSIS — E119 Type 2 diabetes mellitus without complications: Secondary | ICD-10-CM

## 2020-07-22 DIAGNOSIS — D631 Anemia in chronic kidney disease: Secondary | ICD-10-CM | POA: Diagnosis not present

## 2020-07-22 DIAGNOSIS — R32 Unspecified urinary incontinence: Secondary | ICD-10-CM | POA: Diagnosis not present

## 2020-07-22 DIAGNOSIS — N184 Chronic kidney disease, stage 4 (severe): Secondary | ICD-10-CM | POA: Diagnosis not present

## 2020-07-22 DIAGNOSIS — N2581 Secondary hyperparathyroidism of renal origin: Secondary | ICD-10-CM | POA: Diagnosis not present

## 2020-07-22 DIAGNOSIS — E1122 Type 2 diabetes mellitus with diabetic chronic kidney disease: Secondary | ICD-10-CM | POA: Diagnosis not present

## 2020-07-22 DIAGNOSIS — I10 Essential (primary) hypertension: Secondary | ICD-10-CM | POA: Diagnosis not present

## 2020-07-23 ENCOUNTER — Encounter: Payer: Self-pay | Admitting: Family Medicine

## 2020-07-30 ENCOUNTER — Encounter: Payer: Self-pay | Admitting: Urology

## 2020-08-11 ENCOUNTER — Encounter: Payer: Self-pay | Admitting: Family Medicine

## 2020-08-13 ENCOUNTER — Ambulatory Visit: Payer: Medicare HMO | Admitting: Urology

## 2020-08-13 ENCOUNTER — Other Ambulatory Visit: Payer: Self-pay

## 2020-08-13 ENCOUNTER — Encounter: Payer: Self-pay | Admitting: Urology

## 2020-08-13 VITALS — BP 131/57 | HR 60 | Ht 70.5 in | Wt 186.0 lb

## 2020-08-13 DIAGNOSIS — N401 Enlarged prostate with lower urinary tract symptoms: Secondary | ICD-10-CM

## 2020-08-13 LAB — URINALYSIS, COMPLETE
Bilirubin, UA: NEGATIVE
Glucose, UA: NEGATIVE
Ketones, UA: NEGATIVE
Leukocytes,UA: NEGATIVE
Nitrite, UA: NEGATIVE
Specific Gravity, UA: 1.02 (ref 1.005–1.030)
Urobilinogen, Ur: 0.2 mg/dL (ref 0.2–1.0)
pH, UA: 5.5 (ref 5.0–7.5)

## 2020-08-13 LAB — MICROSCOPIC EXAMINATION
Bacteria, UA: NONE SEEN
Epithelial Cells (non renal): NONE SEEN /hpf (ref 0–10)

## 2020-08-13 LAB — BLADDER SCAN AMB NON-IMAGING: Scan Result: 0

## 2020-08-13 NOTE — Progress Notes (Signed)
08/13/2020 1:45 PM   Cody Howard 01/06/1935 [id number]  Referring provider: Anthonette Legato, MD [place],  [place] [address]  Chief Complaint  Patient presents with  . Benign Prostatic Hypertrophy    HPI: 86 y.o. male presents for urinary incontinence.   I have seen him in the past for BPH and he was last seen February 2021 he had been on tamsulosin for lower urinary tract symptoms which was discontinued secondary to fatigue and nausea  At his last visit he did have urinary frequency but symptoms were not bothersome enough that he desired medical management and he was to be seen prn  A referral was received by Dr. Holley Raring for urinary incontinence however the patient denies urinary incontinence and states he has a problem off fecal incontinence and Dr. [name] placed a GI referral to [facility] that he is scheduled for later this summer  He denies bothersome lower urinary tract symptoms and urinary incontinence   PMH: Past Medical History:  Diagnosis Date  . AF (atrial fibrillation) ([place])   . Arthritis    hands  . Chronic kidney disease   . Diabetes mellitus without complication ([place])   . Hyperlipidemia   . Hypertension   . Prostate enlargement     Surgical History: Past Surgical History:  Procedure Laterality Date  . COLONOSCOPY N/A 02/26/2015   Procedure: COLONOSCOPY;  Surgeon: Hulen Luster, MD;  Location: [place] [facility];  Service: Gastroenterology;  Laterality: N/A;  . COLONOSCOPY WITH PROPOFOL N/A 11/02/2017   Procedure: COLONOSCOPY WITH PROPOFOL;  Surgeon: Toledo, Benay Pike, MD;  Location: [facility];  Service: Gastroenterology;  Laterality: N/A;  . ESOPHAGOGASTRODUODENOSCOPY N/A 02/26/2015   Procedure: ESOPHAGOGASTRODUODENOSCOPY (EGD);  Surgeon: Hulen Luster, MD;  Location: [facility];  Service: Gastroenterology;  Laterality: N/A;  Diabetic - oral meds  . ESOPHAGOGASTRODUODENOSCOPY (EGD) WITH PROPOFOL N/A 11/02/2017   Procedure:  ESOPHAGOGASTRODUODENOSCOPY (EGD) WITH PROPOFOL;  Surgeon: Toledo, Benay Pike, MD;  Location: [facility];  Service: Gastroenterology;  Laterality: N/A;  . HIP ARTHROPLASTY Left 06/28/2019   Procedure: ARTHROPLASTY BIPOLAR HIP (HEMIARTHROPLASTY);  Surgeon: Corky Mull, MD;  Location: [facility];  Service: Orthopedics;  Laterality: Left;  . KIDNEY SURGERY Right    surgery when 86 years old  . POLYPECTOMY  02/26/2015   Procedure: POLYPECTOMY;  Surgeon: Hulen Luster, MD;  Location: [facility];  Service: Gastroenterology;;  . sebaceous cyst removal Right    located right of spine on upper back    Home Medications:  Allergies as of 08/13/2020   No Known Allergies     Medication List       Accurate as of August 13, 2020  1:45 PM. If you have any questions, ask your nurse or doctor.        apixaban 2.5 MG Tabs tablet Commonly known as: Eliquis Take 1 tablet (2.5 mg total) by mouth 2 (two) times daily.   carbamide peroxide 6.5 % OTIC solution Commonly known as: DEBROX Place 5 drops into the right ear 2 (two) times daily.   carvedilol 3.125 MG tablet Commonly known as: COREG Take 1 tablet (3.125 mg total) by mouth 2 (two) times daily with a meal.   donepezil 10 MG tablet Commonly known as: ARICEPT Take 1 tablet (10 mg total) by mouth daily. Chipper Herb   ferrous sulfate 325 (65 FE) MG tablet Take 325 mg by mouth daily with breakfast.   furosemide 20 MG tablet Commonly known as:  LASIX Take 1 tablet (20 mg total) by mouth daily.   glipiZIDE 5 MG tablet Commonly known as: GLUCOTROL TAKE 1 TABLET (5 MG TOTAL) BY MOUTH 2 (TWO) TIMES DAILY BEFORE A MEAL.   ibuprofen 200 MG tablet Commonly known as: ADVIL Take 200 mg by mouth every 6 (six) hours as needed.   losartan 100 MG tablet Commonly known as: COZAAR Take 0.5 tablets (50 mg total) by mouth daily.   melatonin 5 MG Tabs Take 5 mg by mouth at bedtime.   OneTouch Delica Plus [id number] Misc USE TO TEST ONCE DAILY    OneTouch Ultra test strip Generic drug: glucose blood USE TO TEST ONCE A DAY   sertraline 25 MG tablet Commonly known as: ZOLOFT Take 1 tablet (25 mg total) by mouth daily.   simvastatin 80 MG tablet Commonly known as: ZOCOR Take 1 tablet (80 mg total) by mouth every evening.       Allergies: No Known Allergies  Family History: Family History  Problem Relation Age of Onset  . Diabetes Father   . Heart disease Father     Social History:  reports that he quit smoking about 42 years ago. He has never used smokeless tobacco. He reports current alcohol use. He reports that he does not use drugs.   Physical Exam: BP (!) 131/57   Pulse 60   Ht 5' 10.5" (1.791 m)   Wt 186 lb (84.4 kg)   BMI 26.31 kg/m   Constitutional:  Alert and oriented, No acute distress. HEENT: [place] AT, moist mucus membranes.  Trachea midline, no masses. Cardiovascular: No clubbing, cyanosis, or edema. Respiratory: Normal respiratory effort, no increased work of breathing.     Assessment & Plan:    1. Benign prostatic hyperplasia with urinary frequency  No bothersome LUTS, does not desire treatment  Bladder scan PVR 0 mL  Follow-up prn   Abbie Sons, MD  [facility] [address], [place], [place] [address] [phone]

## 2020-08-25 ENCOUNTER — Telehealth: Payer: Self-pay | Admitting: Family Medicine

## 2020-08-25 DIAGNOSIS — E119 Type 2 diabetes mellitus without complications: Secondary | ICD-10-CM

## 2020-08-25 NOTE — Telephone Encounter (Signed)
Pt wife is calling to check on the medication refill. She reports that the pt has been out for 2 days.

## 2020-08-25 NOTE — Telephone Encounter (Signed)
Future OV 12/15/20 Approved per protocol. Requested Prescriptions  Pending Prescriptions Disp Refills  . ONETOUCH ULTRA test strip [employer] Med Name: [facility] TEST STRP] 100 strip 0    Sig: USE TO TEST ONCE A DAY     Endocrinology: Diabetes - Testing Supplies Passed - 08/25/2020  4:15 PM      Passed - Valid encounter within last 12 months    Recent Outpatient Visits          2 months ago Essential hypertension   [facility] [name], MD   5 months ago Iron deficiency anemia secondary to inadequate dietary iron intake   [facility] [name], MD   6 months ago Type 2 diabetes mellitus without complication, without long-term current use of insulin ([place])   [facility] [name], MD   7 months ago Type 2 diabetes mellitus without complication, without long-term current use of insulin ([place])   [facility] [name], MD   10 months ago Type 2 diabetes mellitus without complication, without long-term current use of insulin ([place])   [facility] [name], MD      Future Appointments            In 3 months [name], MD [facility], [facility]

## 2020-09-03 DIAGNOSIS — Z86010 Personal history of colonic polyps: Secondary | ICD-10-CM | POA: Diagnosis not present

## 2020-09-03 DIAGNOSIS — R195 Other fecal abnormalities: Secondary | ICD-10-CM | POA: Diagnosis not present

## 2020-09-29 ENCOUNTER — Other Ambulatory Visit: Payer: Self-pay | Admitting: Family Medicine

## 2020-09-29 DIAGNOSIS — E119 Type 2 diabetes mellitus without complications: Secondary | ICD-10-CM

## 2020-10-01 ENCOUNTER — Other Ambulatory Visit: Payer: Self-pay | Admitting: Family Medicine

## 2020-10-01 DIAGNOSIS — I429 Cardiomyopathy, unspecified: Secondary | ICD-10-CM

## 2020-10-01 NOTE — Telephone Encounter (Signed)
Requested Prescriptions  Pending Prescriptions Disp Refills  . furosemide (LASIX) 20 MG tablet [Pharmacy Med Name: FUROSEMIDE 20 MG TABLET] 90 tablet 0    Sig: TAKE 1 TABLET BY MOUTH EVERY DAY     Cardiovascular:  Diuretics - Loop Failed - 10/01/2020  7:50 PM      Failed - K in normal range and within 360 days    Potassium  Date Value Ref Range Status  06/04/2020 5.7 (H) 3.5 - 5.2 mmol/L Final         Failed - Cr in normal range and within 360 days    Creatinine, Ser  Date Value Ref Range Status  06/04/2020 2.30 (H) 0.76 - 1.27 mg/dL Final         Passed - Ca in normal range and within 360 days    Calcium  Date Value Ref Range Status  06/04/2020 8.8 8.6 - 10.2 mg/dL Final         Passed - Na in normal range and within 360 days    Sodium  Date Value Ref Range Status  06/04/2020 141 134 - 144 mmol/L Final         Passed - Last BP in normal range    BP Readings from Last 1 Encounters:  08/13/20 (!) 131/57         Passed - Valid encounter within last 6 months    Recent Outpatient Visits          3 months ago Essential hypertension   Heath, Deanna C, MD   6 months ago Iron deficiency anemia secondary to inadequate dietary iron intake   [facility] [name], MD   7 months ago Type 2 diabetes mellitus without complication, without long-term current use of insulin ([place])   [facility] [name], MD   8 months ago Type 2 diabetes mellitus without complication, without long-term current use of insulin ([place])   [facility] [name], MD   12 months ago Type 2 diabetes mellitus without complication, without long-term current use of insulin ([place])   [facility] [name], MD      Future Appointments            In 2 months [name], MD [facility], [facility]

## 2020-10-17 ENCOUNTER — Telehealth: Payer: Self-pay

## 2020-10-17 NOTE — Telephone Encounter (Signed)
Copied from [place] [id number]. Topic: General - Call Back - No Documentation >> Oct 17, 2020  1:39 PM Erick Blinks wrote: Reason for CRM: Pt's wife called and would like to speak to the nurse, declined to disclose why when asked.  [phone]

## 2020-10-17 NOTE — Telephone Encounter (Signed)
Pt called in stating that they are getting calls from numbers who are requesting information from the patients about diabetes medication. Told pt that Dr. [name] send in the medication for DM and not to give any information out. [facility]old [facility] nobody else should be calling them about supplies. Pts wife verbalized understanding.  KP

## 2020-10-24 ENCOUNTER — Other Ambulatory Visit: Payer: Self-pay | Admitting: Family Medicine

## 2020-10-24 DIAGNOSIS — I429 Cardiomyopathy, unspecified: Secondary | ICD-10-CM

## 2020-10-24 DIAGNOSIS — E119 Type 2 diabetes mellitus without complications: Secondary | ICD-10-CM

## 2020-10-24 NOTE — Telephone Encounter (Signed)
Last refill 09/29/20 , 10/01/20, 08/25/20

## 2020-11-03 DIAGNOSIS — Z7189 Other specified counseling: Secondary | ICD-10-CM | POA: Diagnosis not present

## 2020-11-03 DIAGNOSIS — D485 Neoplasm of uncertain behavior of skin: Secondary | ICD-10-CM | POA: Diagnosis not present

## 2020-11-03 DIAGNOSIS — L570 Actinic keratosis: Secondary | ICD-10-CM | POA: Diagnosis not present

## 2020-11-03 DIAGNOSIS — L814 Other melanin hyperpigmentation: Secondary | ICD-10-CM | POA: Diagnosis not present

## 2020-11-03 DIAGNOSIS — D1801 Hemangioma of skin and subcutaneous tissue: Secondary | ICD-10-CM | POA: Diagnosis not present

## 2020-11-03 DIAGNOSIS — L02222 Furuncle of back [any part, except buttock]: Secondary | ICD-10-CM | POA: Diagnosis not present

## 2020-11-03 DIAGNOSIS — D2372 Other benign neoplasm of skin of left lower limb, including hip: Secondary | ICD-10-CM | POA: Diagnosis not present

## 2020-11-03 DIAGNOSIS — D692 Other nonthrombocytopenic purpura: Secondary | ICD-10-CM | POA: Diagnosis not present

## 2020-11-03 DIAGNOSIS — L821 Other seborrheic keratosis: Secondary | ICD-10-CM | POA: Diagnosis not present

## 2020-11-04 DIAGNOSIS — I5022 Chronic systolic (congestive) heart failure: Secondary | ICD-10-CM | POA: Diagnosis not present

## 2020-11-04 DIAGNOSIS — N1832 Chronic kidney disease, stage 3b: Secondary | ICD-10-CM | POA: Diagnosis not present

## 2020-11-04 DIAGNOSIS — E782 Mixed hyperlipidemia: Secondary | ICD-10-CM | POA: Diagnosis not present

## 2020-11-04 DIAGNOSIS — I4892 Unspecified atrial flutter: Secondary | ICD-10-CM | POA: Diagnosis not present

## 2020-11-14 ENCOUNTER — Other Ambulatory Visit: Payer: Self-pay | Admitting: Family Medicine

## 2020-11-14 DIAGNOSIS — E785 Hyperlipidemia, unspecified: Secondary | ICD-10-CM

## 2020-11-14 DIAGNOSIS — N183 Chronic kidney disease, stage 3 unspecified: Secondary | ICD-10-CM

## 2020-11-14 DIAGNOSIS — E1169 Type 2 diabetes mellitus with other specified complication: Secondary | ICD-10-CM

## 2020-11-14 DIAGNOSIS — I429 Cardiomyopathy, unspecified: Secondary | ICD-10-CM

## 2020-11-14 DIAGNOSIS — E119 Type 2 diabetes mellitus without complications: Secondary | ICD-10-CM

## 2020-11-14 NOTE — Telephone Encounter (Signed)
Mickel Baas calling from [employer]- the medication adhearance team- is calling to request a refill for a 100 day supply for the following medications that the insurance will cover at a 100%  losartan (COZAAR) 100 MG tablet [[id number]]  simvastatin (ZOCOR) 80 MG tablet [[id number]]  glipiZIDE (GLUCOTROL) 5 MG tablet [[id number]]  Tier 1 & Tier 2 drugs can be filled at a 100 day supply. No high risk medication at this supply. And no glass medications.   [id number] [address], [place] [address] Phone: [phone] Fax: [phone] Hours: Not open 24 hours

## 2020-11-15 NOTE — Telephone Encounter (Signed)
Please see last note from [place] agent.  Losartan needs blood work by then end of the month. Last RF 11/28/20    Zocor: overdue lab work last RF 06/04/20 #90 1 RF    Glipizide:last RF 09/29/20 #180

## 2020-11-17 ENCOUNTER — Other Ambulatory Visit: Payer: Self-pay

## 2020-11-17 DIAGNOSIS — I429 Cardiomyopathy, unspecified: Secondary | ICD-10-CM

## 2020-11-17 DIAGNOSIS — N183 Chronic kidney disease, stage 3 unspecified: Secondary | ICD-10-CM

## 2020-11-17 MED ORDER — LOSARTAN POTASSIUM 100 MG PO TABS: 50.0000 mg | ORAL_TABLET | Freq: Every day | ORAL | 0 refills | Status: DC

## 2020-11-17 MED ORDER — SIMVASTATIN 80 MG PO TABS: 80.0000 mg | ORAL_TABLET | Freq: Every evening | ORAL | 0 refills | Status: DC

## 2020-11-17 MED ORDER — GLIPIZIDE 5 MG PO TABS: 5.0000 mg | ORAL_TABLET | Freq: Two times a day (BID) | ORAL | 0 refills | Status: DC

## 2020-11-24 ENCOUNTER — Other Ambulatory Visit: Payer: Self-pay | Admitting: Family Medicine

## 2020-11-24 DIAGNOSIS — E119 Type 2 diabetes mellitus without complications: Secondary | ICD-10-CM

## 2020-11-25 DIAGNOSIS — I10 Essential (primary) hypertension: Secondary | ICD-10-CM | POA: Diagnosis not present

## 2020-11-25 DIAGNOSIS — N2581 Secondary hyperparathyroidism of renal origin: Secondary | ICD-10-CM | POA: Diagnosis not present

## 2020-11-25 DIAGNOSIS — D631 Anemia in chronic kidney disease: Secondary | ICD-10-CM | POA: Diagnosis not present

## 2020-11-25 DIAGNOSIS — E1122 Type 2 diabetes mellitus with diabetic chronic kidney disease: Secondary | ICD-10-CM | POA: Diagnosis not present

## 2020-11-25 DIAGNOSIS — N184 Chronic kidney disease, stage 4 (severe): Secondary | ICD-10-CM | POA: Diagnosis not present

## 2020-11-25 DIAGNOSIS — E875 Hyperkalemia: Secondary | ICD-10-CM | POA: Diagnosis not present

## 2020-11-25 MED ORDER — ONETOUCH ULTRA VI STRP: ORAL_STRIP | 0 refills | Status: DC

## 2020-11-26 ENCOUNTER — Telehealth: Payer: Self-pay

## 2020-11-26 NOTE — Telephone Encounter (Signed)
Pt. Is having a colonoscopy on 10/4 with Dr. Haig Prophet but he wants to switch to Dr. Allen Norris. Daughter Arvil Chaco would like a call back to discuss options. [phone].

## 2020-11-27 ENCOUNTER — Other Ambulatory Visit: Payer: Self-pay | Admitting: Family Medicine

## 2020-11-27 DIAGNOSIS — F329 Major depressive disorder, single episode, unspecified: Secondary | ICD-10-CM

## 2020-11-27 NOTE — Telephone Encounter (Signed)
Spoke with Arvil Chaco the patients daughter and informed her they will need the provider to send over a referral to the clinic for him to be schedule for colonoscopy. She verbalized understanding.

## 2020-11-28 ENCOUNTER — Ambulatory Visit (INDEPENDENT_AMBULATORY_CARE_PROVIDER_SITE_OTHER): Payer: Medicare HMO | Admitting: Family Medicine

## 2020-11-28 ENCOUNTER — Other Ambulatory Visit: Payer: Self-pay

## 2020-11-28 ENCOUNTER — Encounter: Payer: Self-pay | Admitting: Family Medicine

## 2020-11-28 VITALS — BP 130/60 | HR 76 | Ht 70.5 in | Wt 190.0 lb

## 2020-11-28 DIAGNOSIS — H6121 Impacted cerumen, right ear: Secondary | ICD-10-CM

## 2020-11-28 DIAGNOSIS — J301 Allergic rhinitis due to pollen: Secondary | ICD-10-CM

## 2020-11-28 DIAGNOSIS — J310 Chronic rhinitis: Secondary | ICD-10-CM | POA: Diagnosis not present

## 2020-11-28 MED ORDER — MONTELUKAST SODIUM 10 MG PO TABS: 10.0000 mg | ORAL_TABLET | Freq: Every day | ORAL | 3 refills | Status: DC

## 2020-11-28 MED ORDER — MOMETASONE FUROATE 50 MCG/ACT NA SUSP: 2.0000 | Freq: Every day | NASAL | 12 refills | Status: DC

## 2020-11-28 NOTE — Progress Notes (Signed)
Date:  11/28/2020   Name:  Cody Howard   DOB:  November 29, 1934   MRN:  [id number]   Chief Complaint: nose running  Sore Throat  This is a new problem. The current episode started yesterday. The problem has been gradually improving. Neither side of throat is experiencing more pain than the other. There has been no fever. Associated symptoms include congestion. Pertinent negatives include no abdominal pain, coughing, diarrhea, drooling, ear discharge, ear pain, headaches, hoarse voice, neck pain or shortness of breath.   Lab Results  Component Value Date   CREATININE 2.30 (H) 06/04/2020   BUN 39 (H) 06/04/2020   NA 141 06/04/2020   K 5.7 (H) 06/04/2020   CL 107 (H) 06/04/2020   CO2 17 (L) 06/04/2020   Lab Results  Component Value Date   CHOL 257 (H) 03/12/2019   HDL 59 03/12/2019   LDLCALC 167 (H) 03/12/2019   TRIG 172 (H) 03/12/2019   CHOLHDL 2.3 11/29/2016   Lab Results  Component Value Date   TSH 2.710 05/31/2019   Lab Results  Component Value Date   HGBA1C 8.0 (H) 01/11/2020   Lab Results  Component Value Date   WBC 9.2 03/27/2020   HGB 12.4 (L) 03/27/2020   HCT 36.1 (L) 03/27/2020   MCV 99 (H) 03/27/2020   PLT 156 03/27/2020   Lab Results  Component Value Date   ALT 15 01/01/2019   AST 21 01/01/2019   ALKPHOS 82 01/01/2019   BILITOT 0.7 01/01/2019     Review of Systems  Constitutional:  Negative for chills and fever.  HENT:  Positive for congestion. Negative for drooling, ear discharge, ear pain, hoarse voice and sore throat.   Respiratory:  Negative for cough, shortness of breath and wheezing.   Cardiovascular:  Negative for chest pain, palpitations and leg swelling.  Gastrointestinal:  Negative for abdominal pain, blood in stool, constipation, diarrhea and nausea.  Endocrine: Negative for polydipsia.  Genitourinary:  Negative for dysuria, frequency, hematuria and urgency.  Musculoskeletal:  Negative for back pain, myalgias and neck pain.  Skin:   Negative for rash.  Allergic/Immunologic: Negative for environmental allergies.  Neurological:  Negative for dizziness and headaches.  Hematological:  Does not bruise/bleed easily.  Psychiatric/Behavioral:  Negative for suicidal ideas. The patient is not nervous/anxious.    Patient Active Problem List   Diagnosis Date Noted   Moderate mitral regurgitation 09/12/2019   Status post hip hemiarthroplasty 06/28/2019   Atrial fibrillation, chronic ([place]) 06/27/2019   Depression 06/27/2019   Fall at home, initial encounter 06/27/2019   Closed displaced fracture of left femoral neck ([place]) 06/27/2019   History of anemia 05/31/2019   Benign prostatic hyperplasia with lower urinary tract symptoms 05/31/2019   Cerebral atrophy, mild ([place]) 05/31/2019   Reactive depression 05/31/2019   Essential hypertension 05/31/2019   Hiatal hernia 05/31/2019   Cardiac syncope 02/20/2019   Loss of memory 05/25/2018   Dizziness 04/12/2018   TIA (transient ischemic attack) 96/22/2979   Chronic systolic CHF (congestive heart failure), NYHA class 3 ([place]) 11/29/2017   CKD (chronic kidney disease) stage 3, GFR 30-59 ml/min ([facility]) 08/18/2017   Bradycardia 08/18/2017   Atrial flutter, paroxysmal ([place]) 07/21/2017   Functional dyspnea 07/06/2017   Cardiomyopathy ([place]) 07/06/2017   Hyperlipidemia, mixed 06/20/2017   Hyperlipidemia associated with type 2 diabetes mellitus ([place]) 04/06/2016   Type II diabetes mellitus with renal manifestations ([place]) 02/10/2015   Personal history of other diseases of male genital organs 02/10/2015  Sebaceous cyst 08/28/2014    No Known Allergies  Past Surgical History:  Procedure Laterality Date   COLONOSCOPY N/A 02/26/2015   Procedure: COLONOSCOPY;  Surgeon: Hulen Luster, MD;  Location: [facility];  Service: Gastroenterology;  Laterality: N/A;   COLONOSCOPY WITH PROPOFOL N/A 11/02/2017   Procedure: COLONOSCOPY WITH PROPOFOL;  Surgeon: Toledo, Benay Pike, MD;  Location: [facility];  Service: Gastroenterology;  Laterality: N/A;   ESOPHAGOGASTRODUODENOSCOPY N/A 02/26/2015   Procedure: ESOPHAGOGASTRODUODENOSCOPY (EGD);  Surgeon: Hulen Luster, MD;  Location: [facility];  Service: Gastroenterology;  Laterality: N/A;  Diabetic - oral meds   ESOPHAGOGASTRODUODENOSCOPY (EGD) WITH PROPOFOL N/A 11/02/2017   Procedure: ESOPHAGOGASTRODUODENOSCOPY (EGD) WITH PROPOFOL;  Surgeon: Toledo, Benay Pike, MD;  Location: [facility];  Service: Gastroenterology;  Laterality: N/A;   HIP ARTHROPLASTY Left 06/28/2019   Procedure: ARTHROPLASTY BIPOLAR HIP (HEMIARTHROPLASTY);  Surgeon: Corky Mull, MD;  Location: [facility];  Service: Orthopedics;  Laterality: Left;   KIDNEY SURGERY Right    surgery when 86 years old   POLYPECTOMY  02/26/2015   Procedure: POLYPECTOMY;  Surgeon: Hulen Luster, MD;  [facility];  Service: Gastroenterology;;   sebaceous cyst removal Right    located right of spine on upper back    Social History   Tobacco Use   Smoking status: Former    Types: Cigarettes    Quit date: 03/08/1978    Years since quitting: 42.7   Smokeless tobacco: Never  Vaping Use   Vaping Use: Never used  Substance Use Topics   Alcohol use: Yes    Alcohol/week: 0.0 standard drinks    Comment: Rarely   Drug use: No     Medication list has been reviewed and updated.  Current Meds  Medication Sig   apixaban (ELIQUIS) 2.5 MG TABS tablet Take 1 tablet (2.5 mg total) by mouth 2 (two) times daily.   carbamide peroxide (DEBROX) 6.5 % OTIC solution Place 5 drops into the right ear 2 (two) times daily.   carvedilol (COREG) 3.125 MG tablet Take 1 tablet (3.125 mg total) by mouth 2 (two) times daily with a meal.   donepezil (ARICEPT) 10 MG tablet Take 1 tablet (10 mg total) by mouth daily. Chipper Herb   ferrous sulfate 325 (65 FE) MG tablet Take 325 mg by mouth daily with breakfast.   furosemide (LASIX) 20 MG tablet TAKE 1 TABLET BY MOUTH EVERY DAY   glipiZIDE  (GLUCOTROL) 5 MG tablet Take 1 tablet (5 mg total) by mouth 2 (two) times daily before a meal.   glucose blood (ONETOUCH ULTRA) test strip Use as instructed   ibuprofen (ADVIL) 200 MG tablet Take 200 mg by mouth every 6 (six) hours as needed.   Lancets (ONETOUCH DELICA PLUS [id number]) MISC USE TO TEST ONCE DAILY   losartan (COZAAR) 100 MG tablet Take 0.5 tablets (50 mg total) by mouth daily.   Melatonin 5 MG TABS Take 5 mg by mouth at bedtime.   ONETOUCH ULTRA test strip USE TO TEST ONCE A DAY   sertraline (ZOLOFT) 25 MG tablet TAKE 1 TABLET (25 MG TOTAL) BY MOUTH DAILY.   simvastatin (ZOCOR) 80 MG tablet Take 1 tablet (80 mg total) by mouth every evening.    PHQ 2/9 Scores 11/28/2020 06/04/2020 01/28/2020 07/23/2019  PHQ - 2 Score 0 1 1 0  PHQ- 9 Score 0 3 4 -    GAD 7 : Generalized Anxiety Score 11/28/2020 06/04/2020 05/31/2019  Nervous, Anxious, on Edge  0 2 0  Control/stop worrying 0 2 0  Worry too much - different things 0 0 0  Trouble relaxing 0 0 0  Restless 0 0 1  Easily annoyed or irritable 0 0 1  Afraid - awful might happen 0 0 0  Total GAD 7 Score 0 4 2  Anxiety Difficulty - Somewhat difficult Not difficult at all    BP Readings from Last 3 Encounters:  11/28/20 130/60  08/13/20 (!) 131/57  06/26/20 (!) 142/73    Physical Exam Vitals and nursing note reviewed.  HENT:     Head: Normocephalic.     Right Ear: External ear normal.     Left Ear: External ear normal.     Nose: Nose normal.  Eyes:     General: No scleral icterus.       Right eye: No discharge.        Left eye: No discharge.     Conjunctiva/sclera: Conjunctivae normal.     Pupils: Pupils are equal, round, and reactive to light.  Neck:     Thyroid: No thyromegaly.     Vascular: No JVD.     Trachea: No tracheal deviation.  Cardiovascular:     Rate and Rhythm: Normal rate and regular rhythm.     Heart sounds: Normal heart sounds. No murmur heard.   No friction rub. No gallop.  Pulmonary:     Effort:  No respiratory distress.     Breath sounds: Normal breath sounds. No wheezing or rales.  Abdominal:     General: Bowel sounds are normal.     Palpations: Abdomen is soft. There is no mass.     Tenderness: There is no abdominal tenderness. There is no guarding or rebound.  Musculoskeletal:        General: No tenderness. Normal range of motion.     Cervical back: Normal range of motion and neck supple.  Lymphadenopathy:     Cervical: No cervical adenopathy.  Skin:    General: Skin is warm.     Findings: No rash.  Neurological:     Mental Status: He is alert.     Cranial Nerves: No cranial nerve deficit.    Wt Readings from Last 3 Encounters:  11/28/20 190 lb (86.2 kg)  08/13/20 186 lb (84.4 kg)  06/26/20 189 lb (85.7 kg)    BP 130/60   Pulse 76   Ht 5' 10.5" (1.791 m)   Wt 190 lb (86.2 kg)   BMI 26.88 kg/m   Assessment and Plan:  1. Chronic rhinitis Chronic.  Uncontrolled.  Patient is having episodic excessive rhinorrhea which may be due to allergies in the past but seems to be more so more frequent.  I have some concerns about the possibility of polyps that is causing some obstruction.  We will refer to ENT for evaluation of his nasal passages but in the meantime in addition to Sudafed over-the-counter and saline nasal lavage over-the-counter I have suggested picking up Nasonex nasal steroid and Singulair 10 mg once a day. - mometasone (NASONEX) 50 MCG/ACT nasal spray; Place 2 sprays into the nose daily.  Dispense: 1 each; Refill: 12 - montelukast (SINGULAIR) 10 MG tablet; Take 1 tablet (10 mg total) by mouth at bedtime.  Dispense: 30 tablet; Refill: 3 - Ambulatory referral to ENT  2. Seasonal allergic rhinitis due to pollen Chronic.  Episodic.  Stable.  Patient refuses to wear a mask when he mows the yard on unable to convince him.  In fact he is stubborn and downright hardheaded. - mometasone (NASONEX) 50 MCG/ACT nasal spray; Place 2 sprays into the nose daily.  Dispense: 1  each; Refill: 12 - montelukast (SINGULAIR) 10 MG tablet; Take 1 tablet (10 mg total) by mouth at bedtime.  Dispense: 30 tablet; Refill: 3 - Ambulatory referral to ENT  3. Impacted cerumen of right ear Right auditory canal is impacted with cerumen and we would also like to have the ear nose and throat evaluate for removal. - Ambulatory referral to ENT

## 2020-12-01 ENCOUNTER — Encounter: Payer: Self-pay | Admitting: Family Medicine

## 2020-12-02 ENCOUNTER — Encounter: Payer: Self-pay | Admitting: Family Medicine

## 2020-12-02 ENCOUNTER — Telehealth: Payer: Self-pay

## 2020-12-02 ENCOUNTER — Other Ambulatory Visit: Payer: Self-pay

## 2020-12-02 DIAGNOSIS — R059 Cough, unspecified: Secondary | ICD-10-CM

## 2020-12-02 MED ORDER — PROMETHAZINE-DM 6.25-15 MG/5ML PO SYRP
5.0000 mL | ORAL_SOLUTION | Freq: Four times a day (QID) | ORAL | 0 refills | Status: DC | PRN
Start: 2020-12-02 — End: 2020-12-08

## 2020-12-02 NOTE — Telephone Encounter (Signed)
Copied from [place] [id number]. Topic: General - Other >> Dec 02, 2020  9:15 AM Tessa Lerner A wrote: Reason for CRM: Patient's wife would like to be contacted by Baxter Flattery when possible  The patient's wife would like the patient to take stronger cough medicine, they feel the current medication is ineffective  Please contact further when available

## 2020-12-02 NOTE — Progress Notes (Signed)
Sent in cough syrup

## 2020-12-04 ENCOUNTER — Telehealth: Payer: Self-pay | Admitting: Family Medicine

## 2020-12-04 NOTE — Telephone Encounter (Incomplete Revision)
Pt's wife saw Dr Allen Norris for colonoscopy. He would like to switch his GI doctor to Dr Allen Norris.  But he needs a new referral to go to this dr.  Abbott Howard states nothing wrong with the other GI dr he sees, but Dr Allen Norris found cancer n the wife and he has put trust in this dr.  Would like referral for colonoscopy.  Dr Lucilla Lame [address], [place], [place] [address] Phone: [phone]

## 2020-12-04 NOTE — Telephone Encounter (Addendum)
Pt's wife saw Dr Allen Norris for colonoscopy. He would like to switch his GI doctor to Dr Allen Norris.  But he needs a new referral to go to this dr.  Abbott Howard states nothing wrong with the other GI dr he sees, but Dr Allen Norris found cancer n the wife and he has put trust in this dr.  Would like referral for colonoscopy.  Dr Lucilla Lame [address], [place], [place] [address] Phone: [phone]

## 2020-12-08 ENCOUNTER — Encounter: Payer: Self-pay | Admitting: Family Medicine

## 2020-12-08 ENCOUNTER — Ambulatory Visit (INDEPENDENT_AMBULATORY_CARE_PROVIDER_SITE_OTHER): Payer: Medicare HMO | Admitting: Family Medicine

## 2020-12-08 ENCOUNTER — Other Ambulatory Visit: Payer: Self-pay

## 2020-12-08 ENCOUNTER — Telehealth: Payer: Self-pay

## 2020-12-08 VITALS — BP 118/74 | HR 66 | Temp 98.6°F | Ht 70.5 in | Wt 190.0 lb

## 2020-12-08 DIAGNOSIS — J019 Acute sinusitis, unspecified: Secondary | ICD-10-CM | POA: Diagnosis not present

## 2020-12-08 MED ORDER — AMOXICILLIN-POT CLAVULANATE 875-125 MG PO TABS: 1.0000 | ORAL_TABLET | Freq: Two times a day (BID) | ORAL | 0 refills | Status: AC

## 2020-12-08 MED ORDER — PROMETHAZINE-DM 6.25-15 MG/5ML PO SYRP: 2.5000 mL | ORAL_SOLUTION | Freq: Four times a day (QID) | ORAL | 0 refills | Status: DC | PRN

## 2020-12-08 NOTE — Telephone Encounter (Signed)
Copied from [place] [id number]. Topic: General - Call Back - No Documentation >> Dec 08, 2020  9:25 AM Virl Axe D wrote: Reason for CRM: Pt's wife requesting callback from Baxter Flattery at some point today. Stated she had questions regarding pt's cough and medications and prefers to speak with Baxter Flattery.

## 2020-12-08 NOTE — Assessment & Plan Note (Signed)
86 year old patient with 7-day history of progressive discharge, productive cough of yellowish sputum, and fatigue. His vitals are reassuring, his lung fields are clear, and cardiac sounds are reassuring. His right TM was obstructed with dark cerumen and after irrigation, both TMs appear dull. He has significant erythema and swelling at his nasal turbinates. His oropharynx appears benign and no significant lymphadenopathy.  I have advised a 10 day course of Augmentin, intranasal steroids, and PRN promethazine-dextromethorphan. He was advised to contact our office next week if symptoms fail to abate.

## 2020-12-08 NOTE — Progress Notes (Signed)
Primary Care / Sports Medicine Office Visit  Patient Information:  Patient ID: Cody Howard, male DOB: 1934/06/21 Age: 86 y.o. MRN: [id number]   Cody Howard is a pleasant 86 y.o. male presenting with the following:  Chief Complaint  Patient presents with   Cough    X1 week; productive yellow phlegm; no chest pain or shortness of breath    Review of Systems pertinent details above   Patient Active Problem List   Diagnosis Date Noted   Acute rhinosinusitis 12/08/2020   Difficulty walking 04/24/2020   Sleeping difficulty 04/24/2020   Moderate mitral regurgitation 09/12/2019   Status post hip hemiarthroplasty 06/28/2019   Atrial fibrillation, chronic ([place]) 06/27/2019   Depression 06/27/2019   Fall at home, initial encounter 06/27/2019   Closed displaced fracture of left femoral neck ([place]) 06/27/2019   History of anemia 05/31/2019   Benign prostatic hyperplasia with lower urinary tract symptoms 05/31/2019   Cerebral atrophy, mild ([place]) 05/31/2019   Reactive depression 05/31/2019   Essential hypertension 05/31/2019   Hiatal hernia 05/31/2019   Cardiac syncope 02/20/2019   Loss of memory 05/25/2018   Dizziness 04/12/2018   TIA (transient ischemic attack) 40/98/1191   Chronic systolic CHF (congestive heart failure), NYHA class 3 ([place]) 11/29/2017   CKD (chronic kidney disease) stage 3, GFR 30-59 ml/min (HCC) 08/18/2017   Bradycardia 08/18/2017   Atrial flutter, paroxysmal ([place]) 07/21/2017   Functional dyspnea 07/06/2017   Cardiomyopathy ([place]) 07/06/2017   Hyperlipidemia, mixed 06/20/2017   Hyperlipidemia associated with type 2 diabetes mellitus ([place]) 04/06/2016   Type II diabetes mellitus with renal manifestations ([place]) 02/10/2015   Personal history of other diseases of male genital organs 02/10/2015   Sebaceous cyst 08/28/2014   Past Medical History:  Diagnosis Date   AF (atrial fibrillation) (HCC)    Arthritis    hands   Chronic kidney disease    Diabetes  mellitus without complication ([place])    Hyperlipidemia    Hypertension    Prostate enlargement    Outpatient Encounter Medications as of 12/08/2020  Medication Sig   amoxicillin-clavulanate (AUGMENTIN) 875-125 MG tablet Take 1 tablet by mouth 2 (two) times daily for 10 days.   apixaban (ELIQUIS) 2.5 MG TABS tablet Take 1 tablet (2.5 mg total) by mouth 2 (two) times daily.   Cholecalciferol 125 MCG (5000 UT) TABS Take 5,000 Units by mouth daily.   donepezil (ARICEPT) 10 MG tablet Take 1 tablet (10 mg total) by mouth daily. Chipper Herb   ferrous sulfate 325 (65 FE) MG tablet Take 325 mg by mouth daily with breakfast.   furosemide (LASIX) 20 MG tablet TAKE 1 TABLET BY MOUTH EVERY DAY   glipiZIDE (GLUCOTROL) 5 MG tablet Take 1 tablet (5 mg total) by mouth 2 (two) times daily before a meal.   glucose blood (ONETOUCH ULTRA) test strip Use as instructed   ibuprofen (ADVIL) 200 MG tablet Take 200 mg by mouth every 6 (six) hours as needed.   Lancets (ONETOUCH DELICA PLUS [id number]) MISC USE TO TEST ONCE DAILY   losartan (COZAAR) 100 MG tablet Take 0.5 tablets (50 mg total) by mouth daily.   mometasone (NASONEX) 50 MCG/ACT nasal spray Place 2 sprays into the nose daily.   montelukast (SINGULAIR) 10 MG tablet Take 1 tablet (10 mg total) by mouth at bedtime.   promethazine-dextromethorphan (PROMETHAZINE-DM) 6.25-15 MG/5ML syrup Take 2.5 mLs by mouth 4 (four) times daily as needed for cough.   sertraline (ZOLOFT) 25 MG tablet TAKE  1 TABLET (25 MG TOTAL) BY MOUTH DAILY.   simvastatin (ZOCOR) 80 MG tablet Take 1 tablet (80 mg total) by mouth every evening.   carvedilol (COREG) 3.125 MG tablet Take 1 tablet (3.125 mg total) by mouth 2 (two) times daily with a meal. (Patient not taking: Reported on 12/08/2020)   ONETOUCH ULTRA test strip USE TO TEST ONCE A DAY (Patient not taking: Reported on 12/08/2020)   [DISCONTINUED] carbamide peroxide (DEBROX) 6.5 % OTIC solution Place 5 drops into the right ear 2 (two)  times daily.   [DISCONTINUED] Melatonin 5 MG TABS Take 5 mg by mouth at bedtime.   [DISCONTINUED] promethazine-dextromethorphan (PROMETHAZINE-DM) 6.25-15 MG/5ML syrup Take 5 mLs by mouth 4 (four) times daily as needed for cough. (Patient not taking: Reported on 12/08/2020)   No facility-administered encounter medications on file as of 12/08/2020.   Past Surgical History:  Procedure Laterality Date   COLONOSCOPY N/A 02/26/2015   Procedure: COLONOSCOPY;  Surgeon: Hulen Luster, MD;  Location: [place];  Service: Gastroenterology;  Laterality: N/A;   COLONOSCOPY WITH PROPOFOL N/A 11/02/2017   Procedure: COLONOSCOPY WITH PROPOFOL;  Surgeon: Toledo, Benay Pike, MD;  Location: [facility];  Service: Gastroenterology;  Laterality: N/A;   ESOPHAGOGASTRODUODENOSCOPY N/A 02/26/2015   Procedure: ESOPHAGOGASTRODUODENOSCOPY (EGD);  Surgeon: Hulen Luster, MD;  Location: [place];  Service: Gastroenterology;  Laterality: N/A;  Diabetic - oral meds   ESOPHAGOGASTRODUODENOSCOPY (EGD) WITH PROPOFOL N/A 11/02/2017   Procedure: ESOPHAGOGASTRODUODENOSCOPY (EGD) WITH PROPOFOL;  Surgeon: Toledo, Benay Pike, MD;  Location: [facility];  Service: Gastroenterology;  Laterality: N/A;   HIP ARTHROPLASTY Left 06/28/2019   Procedure: ARTHROPLASTY BIPOLAR HIP (HEMIARTHROPLASTY);  Surgeon: Corky Mull, MD;  Location: [facility];  Service: Orthopedics;  Laterality: Left;   KIDNEY SURGERY Right    surgery when 86 years old   POLYPECTOMY  02/26/2015   Procedure: POLYPECTOMY;  Surgeon: Hulen Luster, MD;  Location: [place];  Service: Gastroenterology;;   sebaceous cyst removal Right    located right of spine on upper back    Vitals:   12/08/20 1633  BP: 118/74  Pulse: 66  Temp: 98.6 F (37 C)  SpO2: 97%   Vitals:   12/08/20 1633  Weight: 190 lb (86.2 kg)  Height: 5' 10.5" (1.791 m)   Body mass index is 26.88 kg/m.  No results found.   Independent interpretation of notes and tests  performed by another provider:   None  Procedures performed:   None  Pertinent History, Exam, Impression, and Recommendations:   Acute rhinosinusitis 86 year old patient with 7-day history of progressive discharge, productive cough of yellowish sputum, and fatigue. His vitals are reassuring, his lung fields are clear, and cardiac sounds are reassuring. His right TM was obstructed with dark cerumen and after irrigation, both TMs appear dull. He has significant erythema and swelling at his nasal turbinates. His oropharynx appears benign and no significant lymphadenopathy.  I have advised a 10 day course of Augmentin, intranasal steroids, and PRN promethazine-dextromethorphan. He was advised to contact our office next week if symptoms fail to abate.   Orders & Medications Meds ordered this encounter  Medications   amoxicillin-clavulanate (AUGMENTIN) 875-125 MG tablet    Sig: Take 1 tablet by mouth 2 (two) times daily for 10 days.    Dispense:  20 tablet    Refill:  0   promethazine-dextromethorphan (PROMETHAZINE-DM) 6.25-15 MG/5ML syrup    Sig: Take 2.5 mLs by mouth 4 (four) times daily  as needed for cough.    Dispense:  118 mL    Refill:  0   No orders of the defined types were placed in this encounter.    Return if symptoms worsen or fail to improve.     Montel Culver, MD   Primary Care Sports Medicine [place]

## 2020-12-08 NOTE — Patient Instructions (Signed)
-   Take antibiotics for full 10-day course - Use intranasal steroid (Flonase) x10 days - Can use cough medicine on an as-needed basis - Contact our office early next week if symptoms persist without improvement or for any questions - Otherwise, follow-up as needed

## 2020-12-09 ENCOUNTER — Encounter: Admission: RE | Payer: Self-pay | Source: Ambulatory Visit

## 2020-12-09 ENCOUNTER — Ambulatory Visit: Admission: RE | Admit: 2020-12-09 | Payer: Medicare HMO | Source: Ambulatory Visit

## 2020-12-09 SURGERY — COLONOSCOPY
Anesthesia: General

## 2020-12-15 ENCOUNTER — Ambulatory Visit (INDEPENDENT_AMBULATORY_CARE_PROVIDER_SITE_OTHER): Payer: Medicare HMO | Admitting: Family Medicine

## 2020-12-15 ENCOUNTER — Encounter: Payer: Self-pay | Admitting: Family Medicine

## 2020-12-15 ENCOUNTER — Other Ambulatory Visit: Payer: Self-pay

## 2020-12-15 ENCOUNTER — Ambulatory Visit: Payer: Medicare HMO | Admitting: Family Medicine

## 2020-12-15 DIAGNOSIS — J301 Allergic rhinitis due to pollen: Secondary | ICD-10-CM

## 2020-12-15 DIAGNOSIS — J310 Chronic rhinitis: Secondary | ICD-10-CM | POA: Diagnosis not present

## 2020-12-15 DIAGNOSIS — Z7901 Long term (current) use of anticoagulants: Secondary | ICD-10-CM | POA: Diagnosis not present

## 2020-12-15 DIAGNOSIS — E1169 Type 2 diabetes mellitus with other specified complication: Secondary | ICD-10-CM | POA: Diagnosis not present

## 2020-12-15 DIAGNOSIS — E119 Type 2 diabetes mellitus without complications: Secondary | ICD-10-CM

## 2020-12-15 DIAGNOSIS — E785 Hyperlipidemia, unspecified: Secondary | ICD-10-CM

## 2020-12-15 DIAGNOSIS — F329 Major depressive disorder, single episode, unspecified: Secondary | ICD-10-CM | POA: Diagnosis not present

## 2020-12-15 DIAGNOSIS — R69 Illness, unspecified: Secondary | ICD-10-CM | POA: Diagnosis not present

## 2020-12-15 LAB — POCT UA - MICROALBUMIN: Microalbumin Ur, POC: 50 mg/L

## 2020-12-15 MED ORDER — SERTRALINE HCL 25 MG PO TABS: 25.0000 mg | ORAL_TABLET | Freq: Every day | ORAL | 1 refills | Status: DC

## 2020-12-15 MED ORDER — SIMVASTATIN 80 MG PO TABS: 80.0000 mg | ORAL_TABLET | Freq: Every evening | ORAL | 1 refills | Status: DC

## 2020-12-15 MED ORDER — MONTELUKAST SODIUM 10 MG PO TABS: 10.0000 mg | ORAL_TABLET | Freq: Every day | ORAL | 1 refills | Status: DC

## 2020-12-15 MED ORDER — GLIPIZIDE 5 MG PO TABS: 5.0000 mg | ORAL_TABLET | Freq: Two times a day (BID) | ORAL | 1 refills | Status: DC

## 2020-12-15 NOTE — Progress Notes (Signed)
Date:  12/15/2020   Name:  Cody Howard   DOB:  1934/06/17   MRN:  [id number]   Chief Complaint: Hypertension, Hyperlipidemia, and Depression  Hypertension This is a chronic problem. The current episode started more than 1 year ago. The problem has been gradually improving since onset. The problem is controlled. Pertinent negatives include no chest pain, headaches, neck pain, palpitations or shortness of breath. Past treatments include diuretics, beta blockers, alpha 1 blockers and angiotensin blockers. There is no history of chronic renal disease.  Hyperlipidemia This is a chronic problem. The current episode started more than 1 year ago. The problem is controlled. Recent lipid tests were reviewed and are normal. Exacerbating diseases include diabetes. He has no history of chronic renal disease, hypothyroidism, liver disease, obesity or nephrotic syndrome. Pertinent negatives include no chest pain, myalgias or shortness of breath. Current antihyperlipidemic treatment includes statins. The current treatment provides moderate improvement of lipids. There are no compliance problems.  Risk factors for coronary artery disease include hypertension.   Lab Results  Component Value Date   CREATININE 2.30 (H) 06/04/2020   BUN 39 (H) 06/04/2020   NA 141 06/04/2020   K 5.7 (H) 06/04/2020   CL 107 (H) 06/04/2020   CO2 17 (L) 06/04/2020   Lab Results  Component Value Date   CHOL 257 (H) 03/12/2019   HDL 59 03/12/2019   LDLCALC 167 (H) 03/12/2019   TRIG 172 (H) 03/12/2019   CHOLHDL 2.3 11/29/2016   Lab Results  Component Value Date   TSH 2.710 05/31/2019   Lab Results  Component Value Date   HGBA1C 8.0 (H) 01/11/2020   Lab Results  Component Value Date   WBC 9.2 03/27/2020   HGB 12.4 (L) 03/27/2020   HCT 36.1 (L) 03/27/2020   MCV 99 (H) 03/27/2020   PLT 156 03/27/2020   Lab Results  Component Value Date   ALT 15 01/01/2019   AST 21 01/01/2019   ALKPHOS 82 01/01/2019    BILITOT 0.7 01/01/2019     Review of Systems  Constitutional:  Negative for chills and fever.  HENT:  Negative for drooling, ear discharge, ear pain and sore throat.   Respiratory:  Negative for cough, shortness of breath and wheezing.   Cardiovascular:  Negative for chest pain, palpitations and leg swelling.  Gastrointestinal:  Negative for abdominal pain, blood in stool, constipation, diarrhea and nausea.  Endocrine: Negative for polydipsia.  Genitourinary:  Negative for dysuria, frequency, hematuria and urgency.  Musculoskeletal:  Negative for back pain, myalgias and neck pain.  Skin:  Negative for color change, pallor and rash.  Allergic/Immunologic: Negative for environmental allergies.  Neurological:  Negative for dizziness and headaches.  Hematological:  Does not bruise/bleed easily.  Psychiatric/Behavioral:  Negative for suicidal ideas. The patient is not nervous/anxious.    Patient Active Problem List   Diagnosis Date Noted   Acute rhinosinusitis 12/08/2020   Difficulty walking 04/24/2020   Sleeping difficulty 04/24/2020   Moderate mitral regurgitation 09/12/2019   Status post hip hemiarthroplasty 06/28/2019   Atrial fibrillation, chronic ([place]) 06/27/2019   Depression 06/27/2019   Fall at home, initial encounter 06/27/2019   Closed displaced fracture of left femoral neck ([place]) 06/27/2019   History of anemia 05/31/2019   Benign prostatic hyperplasia with lower urinary tract symptoms 05/31/2019   Cerebral atrophy, mild ([place]) 05/31/2019   Reactive depression 05/31/2019   Essential hypertension 05/31/2019   Hiatal hernia 05/31/2019   Cardiac syncope 02/20/2019   Loss  of memory 05/25/2018   Dizziness 04/12/2018   TIA (transient ischemic attack) 60/12/9321   Chronic systolic CHF (congestive heart failure), NYHA class 3 ([place]) 11/29/2017   CKD (chronic kidney disease) stage 3, GFR 30-59 ml/min (HCC) 08/18/2017   Bradycardia 08/18/2017   Atrial flutter, paroxysmal ([place])  07/21/2017   Functional dyspnea 07/06/2017   Cardiomyopathy ([place]) 07/06/2017   Hyperlipidemia, mixed 06/20/2017   Hyperlipidemia associated with type 2 diabetes mellitus ([place]) 04/06/2016   Type II diabetes mellitus with renal manifestations ([place]) 02/10/2015   Personal history of other diseases of male genital organs 02/10/2015   Sebaceous cyst 08/28/2014    No Known Allergies  Past Surgical History:  Procedure Laterality Date   COLONOSCOPY N/A 02/26/2015   Procedure: COLONOSCOPY;  Surgeon: Hulen Luster, MD;  Location: [place];  Service: Gastroenterology;  Laterality: N/A;   COLONOSCOPY WITH PROPOFOL N/A 11/02/2017   Procedure: COLONOSCOPY WITH PROPOFOL;  Surgeon: Toledo, Benay Pike, MD;  Location: [facility];  Service: Gastroenterology;  Laterality: N/A;   ESOPHAGOGASTRODUODENOSCOPY N/A 02/26/2015   Procedure: ESOPHAGOGASTRODUODENOSCOPY (EGD);  Surgeon: Hulen Luster, MD;  Location: [place];  Service: Gastroenterology;  Laterality: N/A;  Diabetic - oral meds   ESOPHAGOGASTRODUODENOSCOPY (EGD) WITH PROPOFOL N/A 11/02/2017   Procedure: ESOPHAGOGASTRODUODENOSCOPY (EGD) WITH PROPOFOL;  Surgeon: Toledo, Benay Pike, MD;  Location: [facility];  Service: Gastroenterology;  Laterality: N/A;   HIP ARTHROPLASTY Left 06/28/2019   Procedure: ARTHROPLASTY BIPOLAR HIP (HEMIARTHROPLASTY);  Surgeon: Corky Mull, MD;  Location: [facility];  Service: Orthopedics;  Laterality: Left;   KIDNEY SURGERY Right    surgery when 86 years old   POLYPECTOMY  02/26/2015   Procedure: POLYPECTOMY;  Surgeon: Hulen Luster, MD;  [facility];  Service: Gastroenterology;;   sebaceous cyst removal Right    located right of spine on upper back    Social History   Tobacco Use   Smoking status: Former    Types: Cigarettes    Quit date: 03/08/1978    Years since quitting: 42.8   Smokeless tobacco: Never  Vaping Use   Vaping Use: Never used  Substance Use Topics   Alcohol use: Yes    Drug use: Never     Medication list has been reviewed and updated.  No outpatient medications have been marked as taking for the 12/15/20 encounter (Office Visit) with [name], MD.    [facility] 2/9 Scores 12/08/2020 11/28/2020 06/04/2020 01/28/2020  PHQ - 2 Score 0 0 1 1  PHQ- 9 Score 0 0 3 4    GAD 7 : Generalized Anxiety Score 12/08/2020 11/28/2020 06/04/2020 05/31/2019  Nervous, Anxious, on Edge 0 0 2 0  Control/stop worrying 0 0 2 0  Worry too much - different things 0 0 0 0  Trouble relaxing 0 0 0 0  Restless 0 0 0 1  Easily annoyed or irritable 0 0 0 1  Afraid - awful might happen 0 0 0 0  Total GAD 7 Score 0 0 4 2  Anxiety Difficulty Not difficult at all - Somewhat difficult Not difficult at all    BP Readings from Last 3 Encounters:  12/08/20 118/74  11/28/20 130/60  08/13/20 (!) 131/57    Physical Exam Vitals and nursing note reviewed.  HENT:     Head: Normocephalic.     Right Ear: Tympanic membrane, ear canal and external ear normal. There is no impacted cerumen.     Left Ear: Tympanic membrane, ear canal and  external ear normal. There is no impacted cerumen.     Nose: Nose normal. No congestion or rhinorrhea.  Eyes:     General: No scleral icterus.       Right eye: No discharge.        Left eye: No discharge.     Conjunctiva/sclera: Conjunctivae normal.     Pupils: Pupils are equal, round, and reactive to light.  Neck:     Thyroid: No thyromegaly.     Vascular: No JVD.     Trachea: No tracheal deviation.  Cardiovascular:     Rate and Rhythm: Normal rate and regular rhythm.     Heart sounds: Normal heart sounds. No murmur heard.   No friction rub. No gallop.  Pulmonary:     Effort: No respiratory distress.     Breath sounds: Normal breath sounds. No wheezing, rhonchi or rales.  Abdominal:     General: Bowel sounds are normal.     Palpations: Abdomen is soft. There is no mass.     Tenderness: There is no abdominal tenderness. There is no guarding or  rebound.  Musculoskeletal:        General: No tenderness. Normal range of motion.     Cervical back: Normal range of motion and neck supple.  Lymphadenopathy:     Cervical: No cervical adenopathy.  Skin:    General: Skin is warm.     Findings: No rash.  Neurological:     Mental Status: He is alert and oriented to person, place, and time.     Cranial Nerves: No cranial nerve deficit.     Deep Tendon Reflexes: Reflexes are normal and symmetric.    Wt Readings from Last 3 Encounters:  12/08/20 190 lb (86.2 kg)  11/28/20 190 lb (86.2 kg)  08/13/20 186 lb (84.4 kg)    There were no vitals taken for this visit.  Assessment and Plan:  1. Type 2 diabetes mellitus without complication, without long-term current use of insulin (HCC) Chronic.  Controlled.  Stable.  Continue glipizide 5 mg one twice a day with meals.  Will check A1c at blood drawl at [facility] and microalbumin urea at point-of-care for control status. - glipiZIDE (GLUCOTROL) 5 MG tablet; Take 1 tablet (5 mg total) by mouth 2 (two) times daily before a meal.  Dispense: 180 tablet; Refill: 1 - HgB A1c - POCT UA - Microalbumin  2. Chronic rhinitis Chronic.  Controlled.  Stable.  Continue Singulair 10 mg once a day. - montelukast (SINGULAIR) 10 MG tablet; Take 1 tablet (10 mg total) by mouth at bedtime.  Dispense: 90 tablet; Refill: 1  3. Seasonal allergic rhinitis due to pollen As above. - montelukast (SINGULAIR) 10 MG tablet; Take 1 tablet (10 mg total) by mouth at bedtime.  Dispense: 90 tablet; Refill: 1  4. Reactive depression Chronic.  Controlled.  Stable.  PHQ is 0 Gad score is 0 continue sertraline 25 mg once a day. - sertraline (ZOLOFT) 25 MG tablet; Take 1 tablet (25 mg total) by mouth daily.  Dispense: 90 tablet; Refill: 1  5. Hyperlipidemia associated with type 2 diabetes mellitus (HCC) Chronic.  Controlled.  Stable.  Continue simvastatin 80 mg once a day.  We will access lipid panel per cardiology. -  simvastatin (ZOCOR) 80 MG tablet; Take 1 tablet (80 mg total) by mouth every evening.  Dispense: 90 tablet; Refill: 1

## 2020-12-16 LAB — HEMOGLOBIN A1C
Est. average glucose Bld gHb Est-mCnc: 146 mg/dL
Hgb A1c MFr Bld: 6.7 % — ABNORMAL HIGH (ref 4.8–5.6)

## 2020-12-18 DIAGNOSIS — E1122 Type 2 diabetes mellitus with diabetic chronic kidney disease: Secondary | ICD-10-CM | POA: Diagnosis not present

## 2020-12-18 DIAGNOSIS — N184 Chronic kidney disease, stage 4 (severe): Secondary | ICD-10-CM | POA: Diagnosis not present

## 2021-01-08 DIAGNOSIS — H903 Sensorineural hearing loss, bilateral: Secondary | ICD-10-CM | POA: Diagnosis not present

## 2021-01-08 DIAGNOSIS — H6123 Impacted cerumen, bilateral: Secondary | ICD-10-CM | POA: Diagnosis not present

## 2021-01-08 DIAGNOSIS — J300 Vasomotor rhinitis: Secondary | ICD-10-CM | POA: Diagnosis not present

## 2021-01-22 ENCOUNTER — Other Ambulatory Visit: Payer: Self-pay | Admitting: Family Medicine

## 2021-01-22 DIAGNOSIS — I10 Essential (primary) hypertension: Secondary | ICD-10-CM

## 2021-01-22 DIAGNOSIS — I429 Cardiomyopathy, unspecified: Secondary | ICD-10-CM

## 2021-01-23 NOTE — Telephone Encounter (Signed)
Requested Prescriptions  Pending Prescriptions Disp Refills  . furosemide (LASIX) 20 MG tablet [Pharmacy Med Name: FUROSEMIDE 20 MG TABLET] 90 tablet 2    Sig: TAKE 1 TABLET BY MOUTH EVERY DAY     Cardiovascular:  Diuretics - Loop Failed - 01/22/2021  5:15 PM      Failed - K in normal range and within 360 days    Potassium  Date Value Ref Range Status  06/04/2020 5.7 (H) 3.5 - 5.2 mmol/L Final         Failed - Cr in normal range and within 360 days    Creatinine, Ser  Date Value Ref Range Status  06/04/2020 2.30 (H) 0.76 - 1.27 mg/dL Final         Passed - Ca in normal range and within 360 days    Calcium  Date Value Ref Range Status  06/04/2020 8.8 8.6 - 10.2 mg/dL Final         Passed - Na in normal range and within 360 days    Sodium  Date Value Ref Range Status  06/04/2020 141 134 - 144 mmol/L Final         Passed - Last BP in normal range    BP Readings from Last 1 Encounters:  12/15/20 122/64         Passed - Valid encounter within last 6 months    Recent Outpatient Visits          1 month ago Type 2 diabetes mellitus without complication, without long-term current use of insulin ([place])   [facility] [name], MD   1 month ago Acute rhinosinusitis   [facility] Montel Culver, MD   1 month ago Chronic rhinitis   [facility] [name], MD   7 months ago Essential hypertension   [facility] [name], MD   10 months ago Iron deficiency anemia secondary to inadequate dietary iron intake   [facility] [name], MD             . carvedilol (COREG) 3.125 MG tablet [Pharmacy Med Name: CARVEDILOL 3.125 MG TABLET] 180 tablet 1    Sig: TAKE 1 TABLET (3.125 MG TOTAL) BY MOUTH 2 (TWO) TIMES DAILY WITH A MEAL.     Cardiovascular:  Beta Blockers Passed - 01/22/2021  5:15 PM      Passed - Last BP in normal range    BP Readings from Last 1 Encounters:  12/15/20 122/64         Passed -  Last Heart Rate in normal range    Pulse Readings from Last 1 Encounters:  12/15/20 64         Passed - Valid encounter within last 6 months    Recent Outpatient Visits          1 month ago Type 2 diabetes mellitus without complication, without long-term current use of insulin ([place])   [facility] [name], MD   1 month ago Acute rhinosinusitis   [facility] Montel Culver, MD   1 month ago Chronic rhinitis   [facility] [name], MD   7 months ago Essential hypertension   [facility] [name], MD   10 months ago Iron deficiency anemia secondary to inadequate dietary iron intake   [facility] [name], MD

## 2021-01-26 DIAGNOSIS — H5203 Hypermetropia, bilateral: Secondary | ICD-10-CM | POA: Diagnosis not present

## 2021-01-26 DIAGNOSIS — H524 Presbyopia: Secondary | ICD-10-CM | POA: Diagnosis not present

## 2021-01-26 DIAGNOSIS — H52223 Regular astigmatism, bilateral: Secondary | ICD-10-CM | POA: Diagnosis not present

## 2021-01-26 DIAGNOSIS — Z0100 Encounter for examination of eyes and vision without abnormal findings: Secondary | ICD-10-CM | POA: Diagnosis not present

## 2021-01-26 DIAGNOSIS — Z7984 Long term (current) use of oral hypoglycemic drugs: Secondary | ICD-10-CM | POA: Diagnosis not present

## 2021-01-26 DIAGNOSIS — E119 Type 2 diabetes mellitus without complications: Secondary | ICD-10-CM | POA: Diagnosis not present

## 2021-01-26 DIAGNOSIS — Z961 Presence of intraocular lens: Secondary | ICD-10-CM | POA: Diagnosis not present

## 2021-01-26 LAB — HM DIABETES EYE EXAM

## 2021-02-02 ENCOUNTER — Ambulatory Visit (INDEPENDENT_AMBULATORY_CARE_PROVIDER_SITE_OTHER): Payer: Medicare HMO

## 2021-02-02 DIAGNOSIS — Z0000 Encounter for general adult medical examination without abnormal findings: Secondary | ICD-10-CM | POA: Diagnosis not present

## 2021-02-02 NOTE — Patient Instructions (Signed)
Cody Howard , Thank you for taking time to come for your Medicare Wellness Visit. I appreciate your ongoing commitment to your health goals. Please review the following plan we discussed and let me know if I can assist you in the future.   Screening recommendations/referrals: Colonoscopy: no longer required Recommended yearly ophthalmology/optometry visit for glaucoma screening and checkup Recommended yearly dental visit for hygiene and checkup  Vaccinations: Influenza vaccine: due Pneumococcal vaccine: done 05/09/18 Tdap vaccine: done 06/16/19 Shingles vaccine: Shingrix discussed. Please contact your pharmacy for coverage information.  Covid-19:  done 05/02/19, 05/23/19 & 02/16/20  Advanced directives: Please bring a copy of your health care power of attorney and living will to the office at your convenience.   Conditions/risks identified: Recommend continuing fall prevention in the home  Next appointment: Follow up in one year for your annual wellness visit.   Preventive Care 72 Years and Older, Male Preventive care refers to lifestyle choices and visits with your health care provider that can promote health and wellness. What does preventive care include? A yearly physical exam. This is also called an annual well check. Dental exams once or twice a year. Routine eye exams. Ask your health care provider how often you should have your eyes checked. Personal lifestyle choices, including: Daily care of your teeth and gums. Regular physical activity. Eating a healthy diet. Avoiding tobacco and drug use. Limiting alcohol use. Practicing safe sex. Taking low doses of aspirin every day. Taking vitamin and mineral supplements as recommended by your health care provider. What happens during an annual well check? The services and screenings done by your health care provider during your annual well check will depend on your age, overall health, lifestyle risk factors, and family history of  disease. Counseling  Your health care provider may ask you questions about your: Alcohol use. Tobacco use. Drug use. Emotional well-being. Home and relationship well-being. Sexual activity. Eating habits. History of falls. Memory and ability to understand (cognition). Work and work Statistician. Screening  You may have the following tests or measurements: Height, weight, and BMI. Blood pressure. Lipid and cholesterol levels. These may be checked every 5 years, or more frequently if you are over 25 years old. Skin check. Lung cancer screening. You may have this screening every year starting at age 63 if you have a 30-pack-year history of smoking and currently smoke or have quit within the past 15 years. Fecal occult blood test (FOBT) of the stool. You may have this test every year starting at age 25. Flexible sigmoidoscopy or colonoscopy. You may have a sigmoidoscopy every 5 years or a colonoscopy every 10 years starting at age 87. Prostate cancer screening. Recommendations will vary depending on your family history and other risks. Hepatitis C blood test. Hepatitis B blood test. Sexually transmitted disease (STD) testing. Diabetes screening. This is done by checking your blood sugar (glucose) after you have not eaten for a while (fasting). You may have this done every 1-3 years. Abdominal aortic aneurysm (AAA) screening. You may need this if you are a current or former smoker. Osteoporosis. You may be screened starting at age 70 if you are at high risk. Talk with your health care provider about your test results, treatment options, and if necessary, the need for more tests. Vaccines  Your health care provider may recommend certain vaccines, such as: Influenza vaccine. This is recommended every year. Tetanus, diphtheria, and acellular pertussis (Tdap, Td) vaccine. You may need a Td booster every 10 years. Zoster vaccine.  You may need this after age 21. Pneumococcal 13-valent  conjugate (PCV13) vaccine. One dose is recommended after age 8. Pneumococcal polysaccharide (PPSV23) vaccine. One dose is recommended after age 45. Talk to your health care provider about which screenings and vaccines you need and how often you need them. This information is not intended to replace advice given to you by your health care provider. Make sure you discuss any questions you have with your health care provider. Document Released: 03/21/2015 Document Revised: 11/12/2015 Document Reviewed: 12/24/2014 Elsevier Interactive Patient Education  2017 [employer] Prevention in the Home Falls can cause injuries. They can happen to people of all ages. There are many things you can do to make your home safe and to help prevent falls. What can I do on the outside of my home? Regularly fix the edges of walkways and driveways and fix any cracks. Remove anything that might make you trip as you walk through a door, such as a raised step or threshold. Trim any bushes or trees on the path to your home. Use bright outdoor lighting. Clear any walking paths of anything that might make someone trip, such as rocks or tools. Regularly check to see if handrails are loose or broken. Make sure that both sides of any steps have handrails. Any raised decks and porches should have guardrails on the edges. Have any leaves, snow, or ice cleared regularly. Use sand or salt on walking paths during winter. Clean up any spills in your garage right away. This includes oil or grease spills. What can I do in the bathroom? Use night lights. Install grab bars by the toilet and in the tub and shower. Do not use towel bars as grab bars. Use non-skid mats or decals in the tub or shower. If you need to sit down in the shower, use a plastic, non-slip stool. Keep the floor dry. Clean up any water that spills on the floor as soon as it happens. Remove soap buildup in the tub or shower regularly. Attach bath mats  securely with double-sided non-slip rug tape. Do not have throw rugs and other things on the floor that can make you trip. What can I do in the bedroom? Use night lights. Make sure that you have a light by your bed that is easy to reach. Do not use any sheets or blankets that are too big for your bed. They should not hang down onto the floor. Have a firm chair that has side arms. You can use this for support while you get dressed. Do not have throw rugs and other things on the floor that can make you trip. What can I do in the kitchen? Clean up any spills right away. Avoid walking on wet floors. Keep items that you use a lot in easy-to-reach places. If you need to reach something above you, use a strong step stool that has a grab bar. Keep electrical cords out of the way. Do not use floor polish or wax that makes floors slippery. If you must use wax, use non-skid floor wax. Do not have throw rugs and other things on the floor that can make you trip. What can I do with my stairs? Do not leave any items on the stairs. Make sure that there are handrails on both sides of the stairs and use them. Fix handrails that are broken or loose. Make sure that handrails are as long as the stairways. Check any carpeting to make sure that it is  firmly attached to the stairs. Fix any carpet that is loose or worn. Avoid having throw rugs at the top or bottom of the stairs. If you do have throw rugs, attach them to the floor with carpet tape. Make sure that you have a light switch at the top of the stairs and the bottom of the stairs. If you do not have them, ask someone to add them for you. What else can I do to help prevent falls? Wear shoes that: Do not have high heels. Have rubber bottoms. Are comfortable and fit you well. Are closed at the toe. Do not wear sandals. If you use a stepladder: Make sure that it is fully opened. Do not climb a closed stepladder. Make sure that both sides of the stepladder  are locked into place. Ask someone to hold it for you, if possible. Clearly mark and make sure that you can see: Any grab bars or handrails. First and last steps. Where the edge of each step is. Use tools that help you move around (mobility aids) if they are needed. These include: Canes. Walkers. Scooters. Crutches. Turn on the lights when you go into a dark area. Replace any light bulbs as soon as they burn out. Set up your furniture so you have a clear path. Avoid moving your furniture around. If any of your floors are uneven, fix them. If there are any pets around you, be aware of where they are. Review your medicines with your doctor. Some medicines can make you feel dizzy. This can increase your chance of falling. Ask your doctor what other things that you can do to help prevent falls. This information is not intended to replace advice given to you by your health care provider. Make sure you discuss any questions you have with your health care provider. Document Released: 12/19/2008 Document Revised: 07/31/2015 Document Reviewed: 03/29/2014 Elsevier Interactive Patient Education  2017 [employer].

## 2021-02-02 NOTE — Progress Notes (Signed)
Subjective:   Cody Howard is a 86 y.o. male who presents for Medicare Annual/Subsequent preventive examination.  Virtual Visit via Telephone Note  I connected with  [name] on 02/02/21 at  1:20 PM EST by telephone and verified that I am speaking with the correct person using two identifiers.  Location: Patient: home Provider: [facility] Persons participating in the virtual visit: [place]   I discussed the limitations, risks, security and privacy concerns of performing an evaluation and management service by telephone and the availability of in person appointments. The patient expressed understanding and agreed to proceed.  Interactive audio and video telecommunications were attempted between this nurse and patient, however failed, due to patient having technical difficulties OR patient did not have access to video capability.  We continued and completed visit with audio only.  Some vital signs may be absent or patient reported.   Clemetine Marker, LPN   Review of Systems     Cardiac Risk Factors include: advanced age (>1men, >65 women);diabetes mellitus;dyslipidemia;male gender;hypertension;sedentary lifestyle     Objective:    Today's Vitals   02/02/21 1326  PainSc: 0-No pain   There is no height or weight on file to calculate BMI.  Advanced Directives 02/02/2021 01/28/2020 07/23/2019 06/28/2019 06/27/2019 06/27/2019 01/01/2019  Does Patient Have a Medical Advance Directive? Yes Yes Yes No No No Yes  Type of Paramedic of [place];Living will [place];Living will [place];Living will - - - [place];Living will  Does patient want to make changes to medical advance directive? - - No - Patient declined - - - No - Patient declined  Copy of [place] in Chart? No - copy requested No - copy requested - - - - No - copy requested  Would patient like information  on creating a medical advance directive? - - No - Patient declined No - Patient declined No - Patient declined - -    Current Medications (verified) Outpatient Encounter Medications as of 02/02/2021  Medication Sig   carvedilol (COREG) 3.125 MG tablet TAKE 1 TABLET (3.125 MG TOTAL) BY MOUTH 2 (TWO) TIMES DAILY WITH A MEAL.   Cholecalciferol 125 MCG (5000 UT) TABS Take 5,000 Units by mouth daily.   donepezil (ARICEPT) 10 MG tablet Take 1 tablet (10 mg total) by mouth daily. Chipper Herb   ferrous sulfate 325 (65 FE) MG tablet Take 325 mg by mouth daily with breakfast.   furosemide (LASIX) 20 MG tablet TAKE 1 TABLET BY MOUTH EVERY DAY   glipiZIDE (GLUCOTROL) 5 MG tablet Take 1 tablet (5 mg total) by mouth 2 (two) times daily before a meal.   glucose blood (ONETOUCH ULTRA) test strip Use as instructed   ibuprofen (ADVIL) 200 MG tablet Take 200 mg by mouth every 6 (six) hours as needed.   Lancets (ONETOUCH DELICA PLUS [id number]) MISC USE TO TEST ONCE DAILY   losartan (COZAAR) 100 MG tablet Take 0.5 tablets (50 mg total) by mouth daily.   montelukast (SINGULAIR) 10 MG tablet Take 1 tablet (10 mg total) by mouth at bedtime.   sertraline (ZOLOFT) 25 MG tablet Take 1 tablet (25 mg total) by mouth daily.   simvastatin (ZOCOR) 80 MG tablet Take 1 tablet (80 mg total) by mouth every evening.   warfarin (COUMADIN) 4 MG tablet Take 4 mg by mouth daily.   [DISCONTINUED] promethazine-dextromethorphan (PROMETHAZINE-DM) 6.25-15 MG/5ML syrup Take 2.5 mLs by mouth 4 (four) times  daily as needed for cough.   ipratropium (ATROVENT) 0.06 % nasal spray SMARTSIG:2 Puff(s) Both Nares Every 8 Hours PRN (Patient not taking: Reported on 02/02/2021)   mometasone (NASONEX) 50 MCG/ACT nasal spray Place 2 sprays into the nose daily. (Patient not taking: Reported on 02/02/2021)   [DISCONTINUED] apixaban (ELIQUIS) 2.5 MG TABS tablet Take 1 tablet (2.5 mg total) by mouth 2 (two) times daily.   [DISCONTINUED] ONETOUCH ULTRA  test strip USE TO TEST ONCE A DAY   No facility-administered encounter medications on file as of 02/02/2021.    Allergies (verified) Patient has no known allergies.   History: Past Medical History:  Diagnosis Date   AF (atrial fibrillation) (HCC)    Arthritis    hands   Chronic kidney disease    Diabetes mellitus without complication ([place])    Hyperlipidemia    Hypertension    Prostate enlargement    Past Surgical History:  Procedure Laterality Date   COLONOSCOPY N/A 02/26/2015   Procedure: COLONOSCOPY;  Surgeon: Hulen Luster, MD;  Location: [place];  Service: Gastroenterology;  Laterality: N/A;   COLONOSCOPY WITH PROPOFOL N/A 11/02/2017   Procedure: COLONOSCOPY WITH PROPOFOL;  Surgeon: Toledo, Benay Pike, MD;  Location: [facility];  Service: Gastroenterology;  Laterality: N/A;   ESOPHAGOGASTRODUODENOSCOPY N/A 02/26/2015   Procedure: ESOPHAGOGASTRODUODENOSCOPY (EGD);  Surgeon: Hulen Luster, MD;  Location: [place] [facility];  Service: Gastroenterology;  Laterality: N/A;  Diabetic - oral meds   ESOPHAGOGASTRODUODENOSCOPY (EGD) WITH PROPOFOL N/A 11/02/2017   Procedure: ESOPHAGOGASTRODUODENOSCOPY (EGD) WITH PROPOFOL;  Surgeon: Toledo, Benay Pike, MD;  Location: [facility];  Service: Gastroenterology;  Laterality: N/A;   HIP ARTHROPLASTY Left 06/28/2019   Procedure: ARTHROPLASTY BIPOLAR HIP (HEMIARTHROPLASTY);  Surgeon: Corky Mull, MD;  Location: [facility];  Service: Orthopedics;  Laterality: Left;   KIDNEY SURGERY Right    surgery when 86 years old   POLYPECTOMY  02/26/2015   Procedure: POLYPECTOMY;  Surgeon: Hulen Luster, MD;  Location: [place];  Service: Gastroenterology;;   sebaceous cyst removal Right    located right of spine on upper back   Family History  Problem Relation Age of Onset   Diabetes Father    Heart disease Father    Social History   Socioeconomic History   Marital status: Married    Spouse name: Cody Howard   Number of children:  2   Years of education: 80   Highest education level: Associate degree: occupational, Hotel manager, or vocational program  Occupational History   Occupation: retired  Tobacco Use   Smoking status: Former    Types: Cigarettes    Quit date: 03/08/1978    Years since quitting: 42.9   Smokeless tobacco: Never  Vaping Use   Vaping Use: Never used  Substance and Sexual Activity   Alcohol use: Not Currently   Drug use: Never   Sexual activity: Not Currently    Partners: Female  Other Topics Concern   Not on file  Social History Narrative   Not on file   Social Determinants of Health   Financial Resource Strain: Low Risk    Difficulty of Paying Living Expenses: Not hard at all  Food Insecurity: No Food Insecurity   Worried About Charity fundraiser in the Last Year: Never true   [place] in the Last Year: Never true  Transportation Needs: No Transportation Needs   Lack of Transportation (Medical): No   Lack of Transportation (Non-Medical): No  Physical Activity:  Inactive   Days of Exercise per Week: 0 days   Minutes of Exercise per Session: 0 min  Stress: No Stress Concern Present   Feeling of Stress : Not at all  Social Connections: Moderately Integrated   Frequency of Communication with Friends and Family: More than three times a week   Frequency of Social Gatherings with Friends and Family: Three times a week   Attends Religious Services: More than 4 times per year   Active Member of Clubs or Organizations: No   Attends Archivist Meetings: Never   Marital Status: Married    Tobacco Counseling Counseling given: Not Answered   Clinical Intake:  Pre-visit preparation completed: Yes  Pain : No/denies pain Pain Score: 0-No pain     Nutritional Risks: None Diabetes: Yes CBG done?: No Did pt. bring in CBG monitor from home?: No  How often do you need to have someone help you when you read instructions, pamphlets, or other written materials from your  doctor or pharmacy?: 1 - Never  Nutrition Risk Assessment:  Has the patient had any N/V/D within the last 2 months?  No  Does the patient have any non-healing wounds?  No  Has the patient had any unintentional weight loss or weight gain?  No   Diabetes:  Is the patient diabetic?  Yes  If diabetic, was a CBG obtained today?  No  Did the patient bring in their glucometer from home?  No  How often do you monitor your CBG's? daily.   Financial Strains and Diabetes Management:  Are you having any financial strains with the device, your supplies or your medication? No .  Does the patient want to be seen by Chronic Care Management for management of their diabetes?  No  Would the patient like to be referred to a Nutritionist or for Diabetic Management?  No   Diabetic Exams:  Diabetic Eye Exam: Completed 01/26/21 negative retinopathy.   Diabetic Foot Exam: Completed 02/14/20.   Interpreter Needed?: No  Information entered by :: Clemetine Marker LPN   Activities of Daily Living In your present state of health, do you have any difficulty performing the following activities: 02/02/2021 12/08/2020  Hearing? Tempie Donning  Vision? N N  Difficulty concentrating or making decisions? Tempie Donning  Walking or climbing stairs? Y Y  Dressing or bathing? N N  Doing errands, shopping? N N  Preparing Food and eating ? N -  Using the Toilet? N -  In the past six months, have you accidently leaked urine? N -  Do you have problems with loss of bowel control? N -  Managing your Medications? N -  Managing your Finances? N -  Housekeeping or managing your Housekeeping? N -  Some recent data might be hidden    Patient Care Team: [name], MD as PCP - General (Family Medicine) Jannifer Franklin, NP as Nurse Practitioner (Neurology) Idelle Leech, [place] (Optometry) Corey Skains, MD as Consulting Physician (Cardiology)  Indicate any recent Medical Services you may have received from other than [facility] providers in  the past year (date may be approximate).     Assessment:   This is a routine wellness examination for [place].  Hearing/Vision screen Hearing Screening - Comments:: Pt denies hearing difficulty Vision Screening - Comments:: Annual vision sreenings done by Dr. Jomarie Longs  Dietary issues and exercise activities discussed: Current Exercise Habits: The patient does not participate in regular exercise at present, Exercise limited by: orthopedic  condition(s)   Goals Addressed             This Visit's Progress    Patient Stated   On track    Patient states he would like to remain healthy.       Depression Screen PHQ 2/9 Scores 02/02/2021 12/08/2020 11/28/2020 06/04/2020 01/28/2020 07/23/2019 05/31/2019  PHQ - 2 Score 0 0 0 1 1 0 2  PHQ- 9 Score 0 0 0 3 4 - 4    Fall Risk Fall Risk  02/02/2021 12/08/2020 11/28/2020 06/19/2020 01/28/2020  Falls in the past year? 0 0 0 0 1  Number falls in past yr: 0 0 0 - 1  Injury with Fall? 0 0 0 - 1  Risk for fall due to : No Fall Risks No Fall Risks No Fall Risks - History of fall(s);Impaired balance/gait  Follow up Falls prevention discussed Falls evaluation completed Falls evaluation completed - Falls prevention discussed    FALL RISK PREVENTION PERTAINING TO THE HOME:  Any stairs in or around the home? Yes  If so, are there any without handrails? Yes - 2 steps to outside Home free of loose throw rugs in walkways, pet beds, electrical cords, etc? Yes  Adequate lighting in your home to reduce risk of falls? Yes   ASSISTIVE DEVICES UTILIZED TO PREVENT FALLS:  Life alert? No  Use of a cane, walker or w/c? No  Grab bars in the bathroom? Yes  Shower chair or bench in shower? Yes  Elevated toilet seat or a handicapped toilet? No   TIMED UP AND GO:  Was the test performed? No . Telephonic visit.   Cognitive Function: Cognitive status assessed by direct observation. Patient has current diagnosis of cognitive impairment. Patient is followed by  neurology for ongoing assessment.       6CIT Screen 05/10/2018  What Year? 0 points  What month? 0 points  What time? 0 points  Count back from 20 0 points  Months in reverse 4 points  Repeat phrase 4 points  Total Score 8    Immunizations Immunization History  Administered Date(s) Administered   Fluad Quad(high Dose 65+) 01/11/2020   Influenza,inj,quad, With Preservative 12/17/2017   Influenza-Unspecified 12/07/2018   PFIZER(Purple Top)SARS-COV-2 Vaccination 05/02/2019, 05/23/2019, 02/16/2020   Pneumococcal Conjugate-13 05/09/2018   Pneumococcal Polysaccharide-23 07/31/2010   Tdap 11/29/2014, 06/16/2019    TDAP status: Up to date  Flu Vaccine status: Due, Education has been provided regarding the importance of this vaccine. Advised may receive this vaccine at local pharmacy or Health Dept. Aware to provide a copy of the vaccination record if obtained from local pharmacy or Health Dept. Verbalized acceptance and understanding.  Pneumococcal vaccine status: Up to date  Covid-19 vaccine status: Completed vaccines  Qualifies for Shingles Vaccine? Yes   Zostavax completed No   Shingrix Completed?: No.    Education has been provided regarding the importance of this vaccine. Patient has been advised to call insurance company to determine out of pocket expense if they have not yet received this vaccine. Advised may also receive vaccine at local pharmacy or Health Dept. Verbalized acceptance and understanding.  Screening Tests Health Maintenance  Topic Date Due   COVID-19 Vaccine (4 - Booster for Pfizer series) 04/12/2020   INFLUENZA VACCINE  10/06/2020   Zoster Vaccines- Shingrix (1 of 2) 02/27/2021 (Originally 01/23/1954)   FOOT EXAM  02/13/2021   HEMOGLOBIN A1C  06/15/2021   OPHTHALMOLOGY EXAM  01/26/2022   TETANUS/TDAP  06/15/2029   Pneumonia  Vaccine 74+ Years old  Completed   HPV VACCINES  Aged Out    Health Maintenance  Health Maintenance Due  Topic Date Due    COVID-19 Vaccine (4 - Booster for Pfizer series) 04/12/2020   INFLUENZA VACCINE  10/06/2020    Colorectal cancer screening: No longer required.   Lung Cancer Screening: (Low Dose CT Chest recommended if Age 22-80 years, 30 pack-year currently smoking OR have quit w/in 15years.) does not qualify.    Additional Screening:  Hepatitis C Screening: does not qualify  Vision Screening: Recommended annual ophthalmology exams for early detection of glaucoma and other disorders of the eye. Is the patient up to date with their annual eye exam?  Yes  Who is the provider or what is the name of the office in which the patient attends annual eye exams? Dr. Matilde Sprang.   Dental Screening: Recommended annual dental exams for proper oral hygiene  Community Resource Referral / Chronic Care Management: CRR required this visit?  No   CCM required this visit?  No      Plan:     I have personally reviewed and noted the following in the patient's chart:   Medical and social history Use of alcohol, tobacco or illicit drugs  Current medications and supplements including opioid prescriptions. Patient is not currently taking opioid prescriptions. Functional ability and status Nutritional status Physical activity Advanced directives List of other physicians Hospitalizations, surgeries, and ER visits in previous 12 months Vitals Screenings to include cognitive, depression, and falls Referrals and appointments  In addition, I have reviewed and discussed with patient certain preventive protocols, quality metrics, and best practice recommendations. A written personalized care plan for preventive services as well as general preventive health recommendations were provided to patient.     Clemetine Marker, LPN   16/12/9602   Nurse Notes: none

## 2021-02-04 ENCOUNTER — Encounter: Payer: Self-pay | Admitting: Family Medicine

## 2021-02-20 ENCOUNTER — Other Ambulatory Visit: Payer: Self-pay | Admitting: Family Medicine

## 2021-02-20 DIAGNOSIS — E119 Type 2 diabetes mellitus without complications: Secondary | ICD-10-CM

## 2021-03-02 DIAGNOSIS — N2581 Secondary hyperparathyroidism of renal origin: Secondary | ICD-10-CM | POA: Diagnosis not present

## 2021-03-02 DIAGNOSIS — D631 Anemia in chronic kidney disease: Secondary | ICD-10-CM | POA: Diagnosis not present

## 2021-03-02 DIAGNOSIS — N184 Chronic kidney disease, stage 4 (severe): Secondary | ICD-10-CM | POA: Diagnosis not present

## 2021-03-02 DIAGNOSIS — I10 Essential (primary) hypertension: Secondary | ICD-10-CM | POA: Diagnosis not present

## 2021-03-02 DIAGNOSIS — E1122 Type 2 diabetes mellitus with diabetic chronic kidney disease: Secondary | ICD-10-CM | POA: Diagnosis not present

## 2021-03-18 ENCOUNTER — Telehealth: Payer: Self-pay

## 2021-03-18 NOTE — Telephone Encounter (Signed)
I spoke to both pt and spouse on the phone concerning a message that Jones received from pt. He was wanting to switch over to Dr Allen Norris. I explained that he had been with Laurine Blazer since 2019 and was scheduled for a colonoscopy in October. He cancelled the appt. Rather than switching doctors, in the middle of care, he needs to proceed with colonoscopy. I gave the number to GI to them and told them to call and get rescheduled. They both voiced understanding

## 2021-03-30 DIAGNOSIS — E1165 Type 2 diabetes mellitus with hyperglycemia: Secondary | ICD-10-CM | POA: Diagnosis not present

## 2021-04-14 ENCOUNTER — Other Ambulatory Visit: Payer: Self-pay | Admitting: Family Medicine

## 2021-04-14 DIAGNOSIS — I429 Cardiomyopathy, unspecified: Secondary | ICD-10-CM

## 2021-04-14 DIAGNOSIS — N183 Chronic kidney disease, stage 3 unspecified: Secondary | ICD-10-CM

## 2021-04-15 NOTE — Telephone Encounter (Signed)
Requested Prescriptions  Pending Prescriptions Disp Refills   losartan (COZAAR) 100 MG tablet [Pharmacy Med Name: LOSARTAN POTASSIUM 100 MG TAB] 45 tablet 0    Sig: TAKE 1/2 TABLET BY MOUTH DAILY     Cardiovascular:  Angiotensin Receptor Blockers Failed - 04/14/2021  4:03 PM      Failed - Cr in normal range and within 180 days    Creatinine, Ser  Date Value Ref Range Status  06/04/2020 2.30 (H) 0.76 - 1.27 mg/dL Final         Failed - K in normal range and within 180 days    Potassium  Date Value Ref Range Status  06/04/2020 5.7 (H) 3.5 - 5.2 mmol/L Final         Passed - Patient is not pregnant      Passed - Last BP in normal range    BP Readings from Last 1 Encounters:  12/15/20 122/64         Passed - Valid encounter within last 6 months    Recent Outpatient Visits          4 months ago Type 2 diabetes mellitus without complication, without long-term current use of insulin ([place])   [facility] [name], MD   4 months ago Acute rhinosinusitis   [facility] Montel Culver, MD   4 months ago Chronic rhinitis   [facility] [name], MD   10 months ago Essential hypertension   [facility] [name], MD   1 year ago Iron deficiency anemia secondary to inadequate dietary iron intake   [facility] [name], MD

## 2021-05-25 ENCOUNTER — Telehealth: Payer: Self-pay

## 2021-05-25 NOTE — Telephone Encounter (Signed)
Noted, thank you

## 2021-05-25 NOTE — Telephone Encounter (Signed)
Insurance is requiring pt to come in for a office visit with Dr [name]. Please call and schedule an appt for him to be seen with Dr [name] for a BP/Medication follow up. ? ?When pt is scheduled, please send me a message back with date and time of his appt so I can send to his insurance. ? ?Thank you. ?

## 2021-06-01 ENCOUNTER — Ambulatory Visit (INDEPENDENT_AMBULATORY_CARE_PROVIDER_SITE_OTHER): Payer: Medicare HMO | Admitting: Family Medicine

## 2021-06-01 ENCOUNTER — Other Ambulatory Visit: Payer: Self-pay

## 2021-06-01 ENCOUNTER — Encounter: Payer: Self-pay | Admitting: Family Medicine

## 2021-06-01 VITALS — BP 130/60 | HR 64 | Ht 70.5 in | Wt 190.0 lb

## 2021-06-01 DIAGNOSIS — R69 Illness, unspecified: Secondary | ICD-10-CM | POA: Diagnosis not present

## 2021-06-01 DIAGNOSIS — J301 Allergic rhinitis due to pollen: Secondary | ICD-10-CM | POA: Diagnosis not present

## 2021-06-01 DIAGNOSIS — N183 Chronic kidney disease, stage 3 unspecified: Secondary | ICD-10-CM

## 2021-06-01 DIAGNOSIS — E785 Hyperlipidemia, unspecified: Secondary | ICD-10-CM | POA: Diagnosis not present

## 2021-06-01 DIAGNOSIS — E119 Type 2 diabetes mellitus without complications: Secondary | ICD-10-CM

## 2021-06-01 DIAGNOSIS — J310 Chronic rhinitis: Secondary | ICD-10-CM

## 2021-06-01 DIAGNOSIS — I10 Essential (primary) hypertension: Secondary | ICD-10-CM

## 2021-06-01 DIAGNOSIS — I429 Cardiomyopathy, unspecified: Secondary | ICD-10-CM

## 2021-06-01 DIAGNOSIS — E1169 Type 2 diabetes mellitus with other specified complication: Secondary | ICD-10-CM | POA: Diagnosis not present

## 2021-06-01 DIAGNOSIS — F329 Major depressive disorder, single episode, unspecified: Secondary | ICD-10-CM

## 2021-06-01 MED ORDER — FUROSEMIDE 20 MG PO TABS: 20.0000 mg | ORAL_TABLET | Freq: Every day | ORAL | 1 refills | Status: DC

## 2021-06-01 MED ORDER — MONTELUKAST SODIUM 10 MG PO TABS: 10.0000 mg | ORAL_TABLET | Freq: Every day | ORAL | 1 refills | Status: DC

## 2021-06-01 MED ORDER — LOSARTAN POTASSIUM 100 MG PO TABS: 50.0000 mg | ORAL_TABLET | Freq: Every day | ORAL | 1 refills | Status: DC

## 2021-06-01 MED ORDER — SIMVASTATIN 80 MG PO TABS: 80.0000 mg | ORAL_TABLET | Freq: Every evening | ORAL | 1 refills | Status: DC

## 2021-06-01 MED ORDER — SERTRALINE HCL 25 MG PO TABS: 25.0000 mg | ORAL_TABLET | Freq: Every day | ORAL | 1 refills | Status: DC

## 2021-06-01 MED ORDER — CARVEDILOL 3.125 MG PO TABS: 3.1250 mg | ORAL_TABLET | Freq: Two times a day (BID) | ORAL | 1 refills | Status: DC

## 2021-06-01 MED ORDER — GLIPIZIDE 5 MG PO TABS: 5.0000 mg | ORAL_TABLET | Freq: Two times a day (BID) | ORAL | 1 refills | Status: DC

## 2021-06-01 NOTE — Progress Notes (Signed)
? ? ?Date:  06/01/2021  ? ?Name:  Cody Howard   DOB:  03-27-1934   MRN:  [id number] ? ? ?Chief Complaint: Hyperlipidemia, Depression, Allergic Rhinitis , Hypertension, and Diabetes ? ?Hyperlipidemia ?This is a chronic problem. The current episode started more than 1 year ago. The problem is controlled. Recent lipid tests were reviewed and are normal. He has no history of chronic renal disease, diabetes, hypothyroidism, liver disease, obesity or nephrotic syndrome. Pertinent negatives include no chest pain, myalgias or shortness of breath. Current antihyperlipidemic treatment includes statins. The current treatment provides moderate improvement of lipids. There are no compliance problems.  Risk factors for coronary artery disease include dyslipidemia and hypertension.  ?Depression ?       This is a chronic problem.  The current episode started more than 1 year ago.   The problem occurs intermittently.  The problem has been gradually improving since onset.  Associated symptoms include no decreased concentration, no fatigue, no helplessness, no hopelessness, does not have insomnia, not irritable, no restlessness, no decreased interest, no appetite change, no body aches, no myalgias, no headaches, no indigestion, not sad and no suicidal ideas.  Past treatments include SSRIs - Selective serotonin reuptake inhibitors.  Compliance with treatment is good.   Pertinent negatives include no hypothyroidism and no anxiety. ?Hypertension ?This is a chronic problem. The current episode started more than 1 year ago. The problem has been gradually improving since onset. The problem is controlled. Pertinent negatives include no anxiety, blurred vision, chest pain, headaches, malaise/fatigue, neck pain, orthopnea, palpitations, peripheral edema, PND, shortness of breath or sweats. There are no associated agents to hypertension. Past treatments include angiotensin blockers, diuretics, beta blockers and alpha 1 blockers. The current  treatment provides moderate improvement. There are no compliance problems.  There is no history of angina, kidney disease, CAD/MI, CVA, heart failure, left ventricular hypertrophy, PVD or retinopathy. There is no history of chronic renal disease, a hypertension causing med or renovascular disease.  ?Diabetes ?He presents for his follow-up diabetic visit. He has type 2 diabetes mellitus. His disease course has been stable. Pertinent negatives for hypoglycemia include no dizziness, headaches, nervousness/anxiousness or sweats. Pertinent negatives for diabetes include no blurred vision, no chest pain, no fatigue and no polydipsia. Pertinent negatives for diabetic complications include no CVA, PVD or retinopathy. Current diabetic treatment includes oral agent (monotherapy). His weight is fluctuating minimally. He is following a generally healthy diet. His breakfast blood glucose range is generally 90-110 mg/dl. An ACE inhibitor/angiotensin II receptor blocker is being taken.  ? ?Lab Results  ?Component Value Date  ? NA 141 06/04/2020  ? K 5.7 (H) 06/04/2020  ? CO2 17 (L) 06/04/2020  ? GLUCOSE 98 06/04/2020  ? BUN 39 (H) 06/04/2020  ? CREATININE 2.30 (H) 06/04/2020  ? CALCIUM 8.8 06/04/2020  ? EGFR 27 (L) 06/04/2020  ? GFRNONAA 26 (L) 01/11/2020  ? ?Lab Results  ?Component Value Date  ? CHOL 257 (H) 03/12/2019  ? HDL 59 03/12/2019  ? LDLCALC 167 (H) 03/12/2019  ? TRIG 172 (H) 03/12/2019  ? CHOLHDL 2.3 11/29/2016  ? ?Lab Results  ?Component Value Date  ? TSH 2.710 05/31/2019  ? ?Lab Results  ?Component Value Date  ? HGBA1C 6.7 (H) 12/15/2020  ? ?Lab Results  ?Component Value Date  ? WBC 9.2 03/27/2020  ? HGB 12.4 (L) 03/27/2020  ? HCT 36.1 (L) 03/27/2020  ? MCV 99 (H) 03/27/2020  ? PLT 156 03/27/2020  ? ?Lab Results  ?Component  Value Date  ? ALT 15 01/01/2019  ? AST 21 01/01/2019  ? ALKPHOS 82 01/01/2019  ? BILITOT 0.7 01/01/2019  ? ?No results found for: 25OHVITD2, [place], VD25OH  ? ?Review of Systems   ?Constitutional:  Negative for appetite change, chills, fatigue, fever and malaise/fatigue.  ?HENT:  Negative for drooling, ear discharge, ear pain and sore throat.   ?Eyes:  Negative for blurred vision.  ?Respiratory:  Negative for cough, shortness of breath and wheezing.   ?Cardiovascular:  Negative for chest pain, palpitations, orthopnea, leg swelling and PND.  ?Gastrointestinal:  Negative for abdominal pain, blood in stool, constipation, diarrhea and nausea.  ?Endocrine: Negative for polydipsia.  ?Genitourinary:  Negative for dysuria, frequency, hematuria and urgency.  ?Musculoskeletal:  Negative for back pain, myalgias and neck pain.  ?Skin:  Negative for rash.  ?Allergic/Immunologic: Negative for environmental allergies.  ?Neurological:  Negative for dizziness and headaches.  ?Hematological:  Does not bruise/bleed easily.  ?Psychiatric/Behavioral:  Positive for depression. Negative for decreased concentration and suicidal ideas. The patient is not nervous/anxious and does not have insomnia.   ? ?Patient Active Problem List  ? Diagnosis Date Noted  ? Acute rhinosinusitis 12/08/2020  ? Difficulty walking 04/24/2020  ? Sleeping difficulty 04/24/2020  ? Moderate mitral regurgitation 09/12/2019  ? Status post hip hemiarthroplasty 06/28/2019  ? Atrial fibrillation, chronic ([place]) 06/27/2019  ? Depression 06/27/2019  ? Fall at home, initial encounter 06/27/2019  ? Closed displaced fracture of left femoral neck ([place]) 06/27/2019  ? History of anemia 05/31/2019  ? Benign prostatic hyperplasia with lower urinary tract symptoms 05/31/2019  ? Cerebral atrophy, mild ([place]) 05/31/2019  ? Reactive depression 05/31/2019  ? Essential hypertension 05/31/2019  ? Hiatal hernia 05/31/2019  ? Cardiac syncope 02/20/2019  ? Loss of memory 05/25/2018  ? Dizziness 04/12/2018  ? TIA (transient ischemic attack) 01/25/2018  ? Chronic systolic CHF (congestive heart failure), NYHA class 3 ([place]) 11/29/2017  ? CKD (chronic kidney disease) stage  3, GFR 30-59 ml/min (HCC) 08/18/2017  ? Bradycardia 08/18/2017  ? Atrial flutter, paroxysmal ([place]) 07/21/2017  ? Functional dyspnea 07/06/2017  ? Cardiomyopathy ([place]) 07/06/2017  ? Hyperlipidemia, mixed 06/20/2017  ? Hyperlipidemia associated with type 2 diabetes mellitus ([place]) 04/06/2016  ? Type II diabetes mellitus with renal manifestations ([place]) 02/10/2015  ? Personal history of other diseases of male genital organs 02/10/2015  ? Sebaceous cyst 08/28/2014  ? ? ?No Known Allergies ? ?Past Surgical History:  ?Procedure Laterality Date  ? COLONOSCOPY N/A 02/26/2015  ? Procedure: COLONOSCOPY;  Surgeon: Hulen Luster, MD;  Location: [facility];  Service: Gastroenterology;  Laterality: N/A;  ? COLONOSCOPY WITH PROPOFOL N/A 11/02/2017  ? Procedure: COLONOSCOPY WITH PROPOFOL;  Surgeon: Toledo, Benay Pike, MD;  Location: [facility];  Service: Gastroenterology;  Laterality: N/A;  ? ESOPHAGOGASTRODUODENOSCOPY N/A 02/26/2015  ? Procedure: ESOPHAGOGASTRODUODENOSCOPY (EGD);  Surgeon: Hulen Luster, MD;  Location: [facility];  Service: Gastroenterology;  Laterality: N/A;  Diabetic - oral meds  ? ESOPHAGOGASTRODUODENOSCOPY (EGD) WITH PROPOFOL N/A 11/02/2017  ? Procedure: ESOPHAGOGASTRODUODENOSCOPY (EGD) WITH PROPOFOL;  Surgeon: Toledo, Benay Pike, MD;  Location: [facility];  Service: Gastroenterology;  Laterality: N/A;  ? HIP ARTHROPLASTY Left 06/28/2019  ? Procedure: ARTHROPLASTY BIPOLAR HIP (HEMIARTHROPLASTY);  Surgeon: Corky Mull, MD;  Location: [facility];  Service: Orthopedics;  Laterality: Left;  ? KIDNEY SURGERY Right   ? surgery when 87 years old  ? POLYPECTOMY  02/26/2015  ? Procedure: POLYPECTOMY;  Surgeon: Hulen Luster, MD;  Location: [facility];  Service: Gastroenterology;;  ? sebaceous cyst removal Right   ? located right of spine on upper back  ? ? ?Social History  ? ?Tobacco Use  ? Smoking status: Former  ?  Types: Cigarettes  ?  Quit date: 03/08/1978  ?  Years since quitting: 43.2  ? Smokeless  tobacco: Never  ?Vaping Use  ? Vaping Use: Never used  ?Substance Use Topics  ? Alcohol use: Not Currently  ? Drug use: Never  ? ? ? ?Medication list has been reviewed and updated. ? ?Current Meds  ?Medication Sig  ?

## 2021-06-02 DIAGNOSIS — N2581 Secondary hyperparathyroidism of renal origin: Secondary | ICD-10-CM | POA: Diagnosis not present

## 2021-06-02 DIAGNOSIS — E875 Hyperkalemia: Secondary | ICD-10-CM | POA: Diagnosis not present

## 2021-06-02 DIAGNOSIS — E1122 Type 2 diabetes mellitus with diabetic chronic kidney disease: Secondary | ICD-10-CM | POA: Diagnosis not present

## 2021-06-02 DIAGNOSIS — R809 Proteinuria, unspecified: Secondary | ICD-10-CM | POA: Diagnosis not present

## 2021-06-02 DIAGNOSIS — D631 Anemia in chronic kidney disease: Secondary | ICD-10-CM | POA: Diagnosis not present

## 2021-06-02 DIAGNOSIS — I10 Essential (primary) hypertension: Secondary | ICD-10-CM | POA: Diagnosis not present

## 2021-06-02 DIAGNOSIS — N184 Chronic kidney disease, stage 4 (severe): Secondary | ICD-10-CM | POA: Diagnosis not present

## 2021-06-02 LAB — LIPID PANEL WITH LDL/HDL RATIO
Cholesterol, Total: 146 mg/dL (ref 100–199)
HDL: 55 mg/dL (ref >39)
LDL Chol Calc (NIH): 73 mg/dL (ref 0–99)
LDL/HDL Ratio: 1.3 ratio (ref 0.0–3.6)
Triglycerides: 98 mg/dL (ref 0–149)
VLDL Cholesterol Cal: 18 mg/dL (ref 5–40)

## 2021-06-02 LAB — COMPREHENSIVE METABOLIC PANEL
ALT: 31 IU/L (ref 0–44)
AST: 32 IU/L (ref 0–40)
Albumin/Globulin Ratio: 1.9 (ref 1.2–2.2)
Albumin: 4.1 g/dL (ref 3.6–4.6)
Alkaline Phosphatase: 91 IU/L (ref 44–121)
BUN/Creatinine Ratio: 15 (ref 10–24)
BUN: 35 mg/dL — ABNORMAL HIGH (ref 8–27)
Bilirubin Total: 0.3 mg/dL (ref 0.0–1.2)
CO2: 19 mmol/L — ABNORMAL LOW (ref 20–29)
Calcium: 8.5 mg/dL — ABNORMAL LOW (ref 8.6–10.2)
Chloride: 109 mmol/L — ABNORMAL HIGH (ref 96–106)
Creatinine, Ser: 2.3 mg/dL — ABNORMAL HIGH (ref 0.76–1.27)
Globulin, Total: 2.2 g/dL (ref 1.5–4.5)
Glucose: 120 mg/dL — ABNORMAL HIGH (ref 70–99)
Potassium: 5.1 mmol/L (ref 3.5–5.2)
Sodium: 141 mmol/L (ref 134–144)
Total Protein: 6.3 g/dL (ref 6.0–8.5)
eGFR: 27 mL/min/{1.73_m2} — ABNORMAL LOW (ref >59)

## 2021-06-02 LAB — HEMOGLOBIN A1C
Est. average glucose Bld gHb Est-mCnc: 143 mg/dL
Hgb A1c MFr Bld: 6.6 % — ABNORMAL HIGH (ref 4.8–5.6)

## 2021-06-16 DIAGNOSIS — Z7901 Long term (current) use of anticoagulants: Secondary | ICD-10-CM | POA: Diagnosis not present

## 2021-06-16 DIAGNOSIS — E782 Mixed hyperlipidemia: Secondary | ICD-10-CM | POA: Diagnosis not present

## 2021-06-16 DIAGNOSIS — I4892 Unspecified atrial flutter: Secondary | ICD-10-CM | POA: Diagnosis not present

## 2021-06-16 DIAGNOSIS — I5022 Chronic systolic (congestive) heart failure: Secondary | ICD-10-CM | POA: Diagnosis not present

## 2021-06-16 DIAGNOSIS — I420 Dilated cardiomyopathy: Secondary | ICD-10-CM | POA: Diagnosis not present

## 2021-06-16 DIAGNOSIS — R55 Syncope and collapse: Secondary | ICD-10-CM | POA: Diagnosis not present

## 2021-06-24 DIAGNOSIS — R791 Abnormal coagulation profile: Secondary | ICD-10-CM | POA: Diagnosis not present

## 2021-06-29 DIAGNOSIS — R55 Syncope and collapse: Secondary | ICD-10-CM | POA: Diagnosis not present

## 2021-06-29 DIAGNOSIS — I4892 Unspecified atrial flutter: Secondary | ICD-10-CM | POA: Diagnosis not present

## 2021-07-01 DIAGNOSIS — R159 Full incontinence of feces: Secondary | ICD-10-CM | POA: Diagnosis not present

## 2021-07-01 DIAGNOSIS — R791 Abnormal coagulation profile: Secondary | ICD-10-CM | POA: Diagnosis not present

## 2021-07-01 DIAGNOSIS — R195 Other fecal abnormalities: Secondary | ICD-10-CM | POA: Diagnosis not present

## 2021-07-07 DIAGNOSIS — I5022 Chronic systolic (congestive) heart failure: Secondary | ICD-10-CM | POA: Diagnosis not present

## 2021-07-07 DIAGNOSIS — I340 Nonrheumatic mitral (valve) insufficiency: Secondary | ICD-10-CM | POA: Diagnosis not present

## 2021-07-07 DIAGNOSIS — I420 Dilated cardiomyopathy: Secondary | ICD-10-CM | POA: Diagnosis not present

## 2021-07-07 DIAGNOSIS — N1832 Chronic kidney disease, stage 3b: Secondary | ICD-10-CM | POA: Diagnosis not present

## 2021-07-07 DIAGNOSIS — E782 Mixed hyperlipidemia: Secondary | ICD-10-CM | POA: Diagnosis not present

## 2021-07-07 DIAGNOSIS — R791 Abnormal coagulation profile: Secondary | ICD-10-CM | POA: Diagnosis not present

## 2021-07-21 DIAGNOSIS — R791 Abnormal coagulation profile: Secondary | ICD-10-CM | POA: Diagnosis not present

## 2021-08-18 DIAGNOSIS — Z7901 Long term (current) use of anticoagulants: Secondary | ICD-10-CM | POA: Diagnosis not present

## 2021-08-20 DIAGNOSIS — N184 Chronic kidney disease, stage 4 (severe): Secondary | ICD-10-CM | POA: Diagnosis not present

## 2021-08-26 DIAGNOSIS — N184 Chronic kidney disease, stage 4 (severe): Secondary | ICD-10-CM | POA: Diagnosis not present

## 2021-09-17 DIAGNOSIS — Z7901 Long term (current) use of anticoagulants: Secondary | ICD-10-CM | POA: Diagnosis not present

## 2021-09-21 DIAGNOSIS — E1159 Type 2 diabetes mellitus with other circulatory complications: Secondary | ICD-10-CM | POA: Diagnosis not present

## 2021-09-21 DIAGNOSIS — E785 Hyperlipidemia, unspecified: Secondary | ICD-10-CM | POA: Diagnosis not present

## 2021-09-21 DIAGNOSIS — I152 Hypertension secondary to endocrine disorders: Secondary | ICD-10-CM | POA: Diagnosis not present

## 2021-09-21 DIAGNOSIS — E1165 Type 2 diabetes mellitus with hyperglycemia: Secondary | ICD-10-CM | POA: Diagnosis not present

## 2021-09-21 DIAGNOSIS — E1169 Type 2 diabetes mellitus with other specified complication: Secondary | ICD-10-CM | POA: Diagnosis not present

## 2021-09-21 LAB — HEMOGLOBIN A1C: Hemoglobin A1C: 6.7

## 2021-09-25 ENCOUNTER — Encounter: Payer: Self-pay | Admitting: Family Medicine

## 2021-10-01 ENCOUNTER — Ambulatory Visit (INDEPENDENT_AMBULATORY_CARE_PROVIDER_SITE_OTHER): Payer: Medicare HMO | Admitting: Family Medicine

## 2021-10-01 ENCOUNTER — Encounter: Payer: Self-pay | Admitting: Family Medicine

## 2021-10-01 DIAGNOSIS — N183 Chronic kidney disease, stage 3 unspecified: Secondary | ICD-10-CM

## 2021-10-01 DIAGNOSIS — E119 Type 2 diabetes mellitus without complications: Secondary | ICD-10-CM

## 2021-10-01 DIAGNOSIS — I10 Essential (primary) hypertension: Secondary | ICD-10-CM

## 2021-10-01 DIAGNOSIS — I429 Cardiomyopathy, unspecified: Secondary | ICD-10-CM

## 2021-10-01 MED ORDER — GLIPIZIDE 5 MG PO TABS: 5.0000 mg | ORAL_TABLET | Freq: Two times a day (BID) | ORAL | 1 refills | Status: DC

## 2021-10-01 MED ORDER — LOSARTAN POTASSIUM 25 MG PO TABS: 25.0000 mg | ORAL_TABLET | Freq: Every day | ORAL | 1 refills | Status: DC

## 2021-10-01 MED ORDER — CARVEDILOL 3.125 MG PO TABS: 3.1250 mg | ORAL_TABLET | Freq: Two times a day (BID) | ORAL | 1 refills | Status: DC

## 2021-10-01 NOTE — Progress Notes (Signed)
Date:  10/01/2021   Name:  Cody Howard   DOB:  15-Jan-1935   MRN:  [id number]   Chief Complaint: Hypertension, Diabetes, and Depression  Hypertension This is a chronic problem. The current episode started more than 1 year ago. The problem has been waxing and waning since onset. The problem is controlled. Pertinent negatives include no anxiety, blurred vision, chest pain, headaches, orthopnea, palpitations, PND, shortness of breath or sweats. There are no associated agents to hypertension. Risk factors for coronary artery disease include dyslipidemia. Past treatments include alpha 1 blockers, beta blockers and angiotensin blockers. There are no compliance problems.  There is no history of angina, kidney disease, CAD/MI, CVA, heart failure, left ventricular hypertrophy, PVD or retinopathy. There is no history of chronic renal disease, a hypertension causing med or renovascular disease.  Diabetes He presents for his follow-up diabetic visit. He has type 2 diabetes mellitus. His disease course has been stable. Pertinent negatives for hypoglycemia include no confusion, dizziness, headaches, hunger, mood changes, nervousness/anxiousness, pallor, seizures, sleepiness, speech difficulty, sweats or tremors. Pertinent negatives for diabetes include no blurred vision, no chest pain, no fatigue, no polydipsia and no polyuria. There are no hypoglycemic complications. There are no diabetic complications. Pertinent negatives for diabetic complications include no CVA, PVD or retinopathy. Risk factors for coronary artery disease include dyslipidemia and hypertension. He is following a generally healthy diet. Meal planning includes avoidance of concentrated sweets and carbohydrate counting. He participates in exercise daily. An ACE inhibitor/angiotensin II receptor blocker is being taken.  Depression        This is a chronic problem.  The current episode started more than 1 year ago.   The onset quality is sudden.    The problem occurs intermittently.  The problem has been gradually improving since onset.  Associated symptoms include no fatigue, no helplessness, no hopelessness, no restlessness, no decreased interest, no appetite change, no headaches and not sad.   Pertinent negatives include no anxiety.   Lab Results  Component Value Date   NA 141 06/01/2021   K 5.1 06/01/2021   CO2 19 (L) 06/01/2021   GLUCOSE 120 (H) 06/01/2021   BUN 35 (H) 06/01/2021   CREATININE 2.30 (H) 06/01/2021   CALCIUM 8.5 (L) 06/01/2021   EGFR 27 (L) 06/01/2021   GFRNONAA 26 (L) 01/11/2020   Lab Results  Component Value Date   CHOL 146 06/01/2021   HDL 55 06/01/2021   LDLCALC 73 06/01/2021   TRIG 98 06/01/2021   CHOLHDL 2.3 11/29/2016   Lab Results  Component Value Date   TSH 2.710 05/31/2019   Lab Results  Component Value Date   HGBA1C 6.7 09/21/2021   Lab Results  Component Value Date   WBC 9.2 03/27/2020   HGB 12.4 (L) 03/27/2020   HCT 36.1 (L) 03/27/2020   MCV 99 (H) 03/27/2020   PLT 156 03/27/2020   Lab Results  Component Value Date   ALT 31 06/01/2021   AST 32 06/01/2021   ALKPHOS 91 06/01/2021   BILITOT 0.3 06/01/2021   No results found for: "25OHVITD2", "25OHVITD3", "VD25OH"   Review of Systems  Constitutional:  Negative for appetite change and fatigue.  Eyes:  Negative for blurred vision.  Respiratory:  Negative for shortness of breath.   Cardiovascular:  Negative for chest pain, palpitations, orthopnea and PND.  Endocrine: Negative for polydipsia and polyuria.  Skin:  Negative for pallor.  Neurological:  Negative for dizziness, tremors, seizures, speech difficulty and headaches.  Psychiatric/Behavioral:  Positive for depression. Negative for confusion. The patient is not nervous/anxious.     Patient Active Problem List   Diagnosis Date Noted   Acute rhinosinusitis 12/08/2020   Difficulty walking 04/24/2020   Sleeping difficulty 04/24/2020   Moderate mitral regurgitation  09/12/2019   Status post hip hemiarthroplasty 06/28/2019   Atrial fibrillation, chronic ([place]) 06/27/2019   Depression 06/27/2019   Fall at home, initial encounter 06/27/2019   Closed displaced fracture of left femoral neck ([place]) 06/27/2019   History of anemia 05/31/2019   Benign prostatic hyperplasia with lower urinary tract symptoms 05/31/2019   Cerebral atrophy, mild ([place]) 05/31/2019   Reactive depression 05/31/2019   Essential hypertension 05/31/2019   Hiatal hernia 05/31/2019   Cardiac syncope 02/20/2019   Loss of memory 05/25/2018   Dizziness 04/12/2018   TIA (transient ischemic attack) 96/06/5407   Chronic systolic CHF (congestive heart failure), NYHA class 3 ([place]) 11/29/2017   CKD (chronic kidney disease) stage 3, GFR 30-59 ml/min (HCC) 08/18/2017   Bradycardia 08/18/2017   Atrial flutter, paroxysmal ([place]) 07/21/2017   Functional dyspnea 07/06/2017   Cardiomyopathy ([employer]) 07/06/2017   Hyperlipidemia, mixed 06/20/2017   Hyperlipidemia associated with type 2 diabetes mellitus ([place]) 04/06/2016   Type II diabetes mellitus with renal manifestations ([place]) 02/10/2015   Personal history of other diseases of male genital organs 02/10/2015   Sebaceous cyst 08/28/2014    No Known Allergies  Past Surgical History:  Procedure Laterality Date   COLONOSCOPY N/A 02/26/2015   Procedure: COLONOSCOPY;  Surgeon: Hulen Luster, MD;  Location: [facility];  Service: Gastroenterology;  Laterality: N/A;   COLONOSCOPY WITH PROPOFOL N/A 11/02/2017   Procedure: COLONOSCOPY WITH PROPOFOL;  Surgeon: Toledo, Benay Pike, MD;  Location: [facility];  Service: Gastroenterology;  Laterality: N/A;   ESOPHAGOGASTRODUODENOSCOPY N/A 02/26/2015   Procedure: ESOPHAGOGASTRODUODENOSCOPY (EGD);  Surgeon: Hulen Luster, MD;  Location: [facility];  Service: Gastroenterology;  Laterality: N/A;  Diabetic - oral meds   ESOPHAGOGASTRODUODENOSCOPY (EGD) WITH PROPOFOL N/A 11/02/2017   Procedure:  ESOPHAGOGASTRODUODENOSCOPY (EGD) WITH PROPOFOL;  Surgeon: Toledo, Benay Pike, MD;  Location: [facility];  Service: Gastroenterology;  Laterality: N/A;   HIP ARTHROPLASTY Left 06/28/2019   Procedure: ARTHROPLASTY BIPOLAR HIP (HEMIARTHROPLASTY);  Surgeon: Corky Mull, MD;  Location: [facility];  Service: Orthopedics;  Laterality: Left;   KIDNEY SURGERY Right    surgery when 87 years old   POLYPECTOMY  02/26/2015   Procedure: POLYPECTOMY;  Surgeon: Hulen Luster, MD;  [facility];  Service: Gastroenterology;;   sebaceous cyst removal Right    located right of spine on upper back    Social History   Tobacco Use   Smoking status: Former    Types: Cigarettes    Quit date: 03/08/1978    Years since quitting: 43.5   Smokeless tobacco: Never  Vaping Use   Vaping Use: Never used  Substance Use Topics   Alcohol use: Not Currently   Drug use: Never     Medication list has been reviewed and updated.  Current Meds  Medication Sig   carvedilol (COREG) 3.125 MG tablet Take 1 tablet (3.125 mg total) by mouth 2 (two) times daily with a meal.   Cholecalciferol 125 MCG (5000 UT) TABS Take 5,000 Units by mouth daily.   donepezil (ARICEPT) 10 MG tablet Take 1 tablet (10 mg total) by mouth daily. [place] Herb   ferrous sulfate 325 (65 FE) MG tablet Take 325 mg by mouth daily with breakfast.  furosemide (LASIX) 20 MG tablet Take 1 tablet (20 mg total) by mouth daily.   glipiZIDE (GLUCOTROL) 5 MG tablet Take 1 tablet (5 mg total) by mouth 2 (two) times daily before a meal.   glucose blood (ONETOUCH ULTRA) test strip Use as instructed   ibuprofen (ADVIL) 200 MG tablet Take 200 mg by mouth every 6 (six) hours as needed.   Lancets (ONETOUCH DELICA PLUS [id number]) MISC USE TO TEST ONCE DAILY   losartan (COZAAR) 100 MG tablet Take 0.5 tablets (50 mg total) by mouth daily.   montelukast (SINGULAIR) 10 MG tablet Take 1 tablet (10 mg total) by mouth at bedtime.   sertraline (ZOLOFT) 25 MG  tablet Take 1 tablet (25 mg total) by mouth daily.   simvastatin (ZOCOR) 80 MG tablet Take 1 tablet (80 mg total) by mouth every evening.   warfarin (COUMADIN) 4 MG tablet Take 4 mg by mouth daily.       10/01/2021    1:55 PM 06/01/2021    1:46 PM 12/08/2020    5:04 PM 11/28/2020    2:45 PM  GAD 7 : Generalized Anxiety Score  Nervous, Anxious, on Edge 0 1 0 0  Control/stop worrying 0 0 0 0  Worry too much - different things 0 0 0 0  Trouble relaxing 0 0 0 0  Restless 0 0 0 0  Easily annoyed or irritable 0 1 0 0  Afraid - awful might happen 0 0 0 0  Total GAD 7 Score 0 2 0 0  Anxiety Difficulty Not difficult at all Not difficult at all Not difficult at all        10/01/2021    1:55 PM 06/01/2021    1:45 PM 02/02/2021    1:30 PM  Depression screen PHQ 2/9  Decreased Interest 0 0 0  Down, Depressed, Hopeless 0 0 0  PHQ - 2 Score 0 0 0  Altered sleeping 0 0 0  Tired, decreased energy 0 0 0  Change in appetite 0 0 0  Feeling bad or failure about yourself  0 0 0  Trouble concentrating 0 0 0  Moving slowly or fidgety/restless 0 0 0  Suicidal thoughts 0 0 0  PHQ-9 Score 0 0 0  Difficult doing work/chores Not difficult at all Not difficult at all Not difficult at all    BP Readings from Last 3 Encounters:  10/01/21 (!) 110/50  06/01/21 130/60  12/15/20 122/64    Physical Exam  Wt Readings from Last 3 Encounters:  10/01/21 192 lb (87.1 kg)  06/01/21 190 lb (86.2 kg)  12/15/20 191 lb (86.6 kg)    BP (!) 110/50   Pulse 88   Ht 5' 10.5" (1.791 m)   Wt 192 lb (87.1 kg)   BMI 27.16 kg/m   Assessment and Plan:  1. Cardiomyopathy, unspecified type ([place]) New onset.  Stable.  Continue carvedilol 3.125 twice a day. - carvedilol (COREG) 3.125 MG tablet; Take 1 tablet (3.125 mg total) by mouth 2 (two) times daily with a meal.  Dispense: 180 tablet; Refill: 1  2. Essential hypertension Chronic.  Controlled.  Stable.  Blood pressure today is 110/50.  Patient is asymptomatic.   Tolerating medication well but we will decrease current dosing of losartan one half of 100 mg for a 50 mg total to 25 mg once a day and will cruise at that lower dosage for both his diabetes and blood pressure and congestive heart failure. - carvedilol (COREG) 3.125 MG  tablet; Take 1 tablet (3.125 mg total) by mouth 2 (two) times daily with a meal.  Dispense: 180 tablet; Refill: 1 - losartan (COZAAR) 25 MG tablet; Take 1 tablet (25 mg total) by mouth daily.  Dispense: 90 tablet; Refill: 1  3. Type 2 diabetes mellitus without complication, without long-term current use of insulin (HCC) Chronic.  Controlled.  Stable.  Continue glipizide 5 mg 1 twice a day.  We will check microalbuminuria - glipiZIDE (GLUCOTROL) 5 MG tablet; Take 1 tablet (5 mg total) by mouth 2 (two) times daily before a meal.  Dispense: 180 tablet; Refill: 1 - Microalbumin, urine  4. CKD (chronic kidney disease) stage 3, GFR 30-59 ml/min (HCC) Chronic.  Controlled.  Stable.  Continue Cozaar 25 mg once a day for protection from diabetic nephropathy. - losartan (COZAAR) 25 MG tablet; Take 1 tablet (25 mg total) by mouth daily.  Dispense: 90 tablet; Refill: 1   Spouse is sent a message with her concern for sleep apnea and this is brought up with patient and there will be some consideration for arranging one-to-one for evaluation for sleep apnea and CPAP.

## 2021-10-02 LAB — MICROALBUMIN, URINE: Microalbumin, Urine: 837.1 ug/mL

## 2021-10-05 DIAGNOSIS — R809 Proteinuria, unspecified: Secondary | ICD-10-CM | POA: Diagnosis not present

## 2021-10-05 DIAGNOSIS — N2581 Secondary hyperparathyroidism of renal origin: Secondary | ICD-10-CM | POA: Diagnosis not present

## 2021-10-05 DIAGNOSIS — I10 Essential (primary) hypertension: Secondary | ICD-10-CM | POA: Diagnosis not present

## 2021-10-05 DIAGNOSIS — R791 Abnormal coagulation profile: Secondary | ICD-10-CM | POA: Diagnosis not present

## 2021-10-05 DIAGNOSIS — D631 Anemia in chronic kidney disease: Secondary | ICD-10-CM | POA: Diagnosis not present

## 2021-10-05 DIAGNOSIS — N184 Chronic kidney disease, stage 4 (severe): Secondary | ICD-10-CM | POA: Diagnosis not present

## 2021-10-05 DIAGNOSIS — E1122 Type 2 diabetes mellitus with diabetic chronic kidney disease: Secondary | ICD-10-CM | POA: Diagnosis not present

## 2021-10-06 ENCOUNTER — Encounter: Payer: Self-pay | Admitting: Family Medicine

## 2021-10-06 ENCOUNTER — Other Ambulatory Visit: Payer: Self-pay

## 2021-10-06 DIAGNOSIS — I10 Essential (primary) hypertension: Secondary | ICD-10-CM

## 2021-10-06 DIAGNOSIS — N183 Chronic kidney disease, stage 3 unspecified: Secondary | ICD-10-CM

## 2021-10-06 MED ORDER — LOSARTAN POTASSIUM 100 MG PO TABS: 50.0000 mg | ORAL_TABLET | Freq: Every day | ORAL | 0 refills | Status: DC

## 2021-10-08 ENCOUNTER — Telehealth: Payer: Self-pay

## 2021-10-08 NOTE — Telephone Encounter (Signed)
I returned Cody Howard's call concerning warfarin vs. Eliquis. It looks like Dr Nehemiah Massed switched pt to Warfarin last October after cost of med was 300.00 for pt cost. I caaled 2 pharmacies to ask about possibility of GOOD RX with Eliquis or Xarelto. They both are Brand name only, so Good RX does not help. I also contacted Dr. Nehemiah Massed who stated "no other options in this case." I notified Kieth Brightly that warfarin is the best/ inexpensive way to go at this point in time. She voiced understanding.

## 2021-10-09 DIAGNOSIS — L578 Other skin changes due to chronic exposure to nonionizing radiation: Secondary | ICD-10-CM | POA: Diagnosis not present

## 2021-10-09 DIAGNOSIS — R208 Other disturbances of skin sensation: Secondary | ICD-10-CM | POA: Diagnosis not present

## 2021-10-09 DIAGNOSIS — D485 Neoplasm of uncertain behavior of skin: Secondary | ICD-10-CM | POA: Diagnosis not present

## 2021-10-21 DIAGNOSIS — R791 Abnormal coagulation profile: Secondary | ICD-10-CM | POA: Diagnosis not present

## 2021-11-09 ENCOUNTER — Other Ambulatory Visit: Payer: Self-pay | Admitting: Family Medicine

## 2021-11-09 DIAGNOSIS — I429 Cardiomyopathy, unspecified: Secondary | ICD-10-CM

## 2021-11-11 NOTE — Telephone Encounter (Signed)
Requested Prescriptions  Pending Prescriptions Disp Refills  . furosemide (LASIX) 20 MG tablet [Pharmacy Med Name: FUROSEMIDE 20 MG TABLET] 90 tablet 0    Sig: TAKE 1 TABLET BY MOUTH EVERY DAY     Cardiovascular:  Diuretics - Loop Failed - 11/09/2021 12:19 PM      Failed - Ca in normal range and within 180 days    Calcium  Date Value Ref Range Status  06/01/2021 8.5 (L) 8.6 - 10.2 mg/dL Final         Failed - Cr in normal range and within 180 days    Creatinine, Ser  Date Value Ref Range Status  06/01/2021 2.30 (H) 0.76 - 1.27 mg/dL Final         Failed - Cl in normal range and within 180 days    Chloride  Date Value Ref Range Status  06/01/2021 109 (H) 96 - 106 mmol/L Final         Failed - Mg Level in normal range and within 180 days    No results found for: "MG"       Passed - K in normal range and within 180 days    Potassium  Date Value Ref Range Status  06/01/2021 5.1 3.5 - 5.2 mmol/L Final         Passed - Na in normal range and within 180 days    Sodium  Date Value Ref Range Status  06/01/2021 141 134 - 144 mmol/L Final         Passed - Last BP in normal range    BP Readings from Last 1 Encounters:  10/01/21 (!) 110/50         Passed - Valid encounter within last 6 months    Recent Outpatient Visits          1 month ago Cardiomyopathy, unspecified type ([place])   [facility] [place], Deanna C, MD   5 months ago Essential hypertension   [facility], MD   11 months ago Type 2 diabetes mellitus without complication, without long-term current use of insulin ([place])   [facility] [place], Deanna C, MD   11 months ago Acute rhinosinusitis   [facility], Earley Abide, MD   11 months ago Chronic rhinitis   [facility] [place], Deanna C, MD

## 2021-11-23 ENCOUNTER — Other Ambulatory Visit: Payer: Self-pay | Admitting: Family Medicine

## 2021-11-23 DIAGNOSIS — J310 Chronic rhinitis: Secondary | ICD-10-CM

## 2021-11-23 DIAGNOSIS — J301 Allergic rhinitis due to pollen: Secondary | ICD-10-CM

## 2021-11-23 DIAGNOSIS — E119 Type 2 diabetes mellitus without complications: Secondary | ICD-10-CM

## 2021-11-23 DIAGNOSIS — F329 Major depressive disorder, single episode, unspecified: Secondary | ICD-10-CM

## 2021-11-24 NOTE — Telephone Encounter (Signed)
Requested Prescriptions  Pending Prescriptions Disp Refills  . glucose blood (ONETOUCH ULTRA) test strip [Pharmacy Med Name: ONE TOUCH ULTRA BLUE TEST STRP] 100 strip 0    Sig: USE TO TEST ONCE A DAY     Endocrinology: Diabetes - Testing Supplies Passed - 11/23/2021  4:05 PM      Passed - Valid encounter within last 12 months    Recent Outpatient Visits          1 month ago Cardiomyopathy, unspecified type ([place])   [place] [facility] [place], Deanna C, MD   5 months ago Essential hypertension   [place] [facility], MD   11 months ago Type 2 diabetes mellitus without complication, without long-term current use of insulin ([place])   [place] [facility] [place], Deanna C, MD   11 months ago Acute rhinosinusitis   [place] [facility] at [facility], Earley Abide, MD   12 months ago Chronic rhinitis   [place] [facility] at [facility], MD             . sertraline (ZOLOFT) 25 MG tablet [Pharmacy Med Name: SERTRALINE HCL 25 MG TABLET] 90 tablet 1    Sig: TAKE 1 TABLET (25 MG TOTAL) BY MOUTH DAILY.     Psychiatry:  Antidepressants - SSRI - sertraline Passed - 11/23/2021  4:05 PM      Passed - AST in normal range and within 360 days    AST  Date Value Ref Range Status  06/01/2021 32 0 - 40 IU/L Final         Passed - ALT in normal range and within 360 days    ALT  Date Value Ref Range Status  06/01/2021 31 0 - 44 IU/L Final         Passed - Completed PHQ-2 or PHQ-9 in the last 360 days      Passed - Valid encounter within last 6 months    Recent Outpatient Visits          1 month ago Cardiomyopathy, unspecified type [facility])   [place] [facility] [place], Deanna C, MD   5 months ago Essential hypertension   [facility], MD   11 months ago Type 2 diabetes mellitus without complication, without long-term current use of insulin ([place])   [place] [facility] [place], Deanna C, MD   11 months ago Acute rhinosinusitis   [place] [facility] [place], Earley Abide, MD   12 months ago Chronic rhinitis   [place] [facility] at [facility], Iven Finn, MD             . montelukast (SINGULAIR) 10 MG tablet [Pharmacy Med Name: MONTELUKAST SOD 10 MG TABLET] 90 tablet 1    Sig: TAKE 1 TABLET BY MOUTH EVERYDAY AT BEDTIME     Pulmonology:  Leukotriene Inhibitors Passed - 11/23/2021  4:05 PM      Passed - Valid encounter within last 12 months    Recent Outpatient Visits          1 month ago Cardiomyopathy, unspecified type ([place])  [place] [facility] [place], Deanna C, MD   5 months ago Essential hypertension   [place] [facility] at [facility], MD   11 months ago Type 2 diabetes mellitus without complication, without long-term current use of insulin ([place])   [place] [facility] [place], Deanna C, MD   11 months ago Acute rhinosinusitis   [place] [facility], Earley Abide, MD   12 months ago Chronic rhinitis    [facility] at [facility], MD

## 2021-12-01 ENCOUNTER — Other Ambulatory Visit: Payer: Self-pay

## 2021-12-01 ENCOUNTER — Telehealth: Payer: Self-pay | Admitting: Family Medicine

## 2021-12-01 DIAGNOSIS — I429 Cardiomyopathy, unspecified: Secondary | ICD-10-CM

## 2021-12-01 DIAGNOSIS — F329 Major depressive disorder, single episode, unspecified: Secondary | ICD-10-CM

## 2021-12-01 MED ORDER — LOSARTAN POTASSIUM 100 MG PO TABS: 50.0000 mg | ORAL_TABLET | Freq: Every day | ORAL | 0 refills | Status: DC

## 2021-12-01 MED ORDER — CHOLECALCIFEROL 125 MCG (5000 UT) PO TABS: 5000.0000 [IU] | ORAL_TABLET | Freq: Every day | ORAL | 0 refills | Status: DC

## 2021-12-01 MED ORDER — SERTRALINE HCL 25 MG PO TABS: 25.0000 mg | ORAL_TABLET | Freq: Every day | ORAL | 0 refills | Status: DC

## 2021-12-01 MED ORDER — ALIGN 4 MG PO CAPS: 1.0000 | ORAL_CAPSULE | Freq: Every day | ORAL | 0 refills | Status: AC

## 2021-12-01 MED ORDER — FERROUS SULFATE 325 (65 FE) MG PO TABS: 325.0000 mg | ORAL_TABLET | Freq: Every day | ORAL | 0 refills | Status: DC

## 2021-12-01 MED ORDER — FUROSEMIDE 20 MG PO TABS: 20.0000 mg | ORAL_TABLET | Freq: Every day | ORAL | 0 refills | Status: DC

## 2021-12-01 NOTE — Telephone Encounter (Signed)
Copied from [place] [id number]. Topic: General - Other >> Dec 01, 2021 11:49 AM Ludger Nutting wrote: Cody Howard with [facility] is requesting a medication list for patient. Fax [phone]

## 2021-12-29 DIAGNOSIS — R809 Proteinuria, unspecified: Secondary | ICD-10-CM | POA: Diagnosis not present

## 2021-12-29 DIAGNOSIS — N184 Chronic kidney disease, stage 4 (severe): Secondary | ICD-10-CM | POA: Diagnosis not present

## 2021-12-29 DIAGNOSIS — I10 Essential (primary) hypertension: Secondary | ICD-10-CM | POA: Diagnosis not present

## 2021-12-29 DIAGNOSIS — D631 Anemia in chronic kidney disease: Secondary | ICD-10-CM | POA: Diagnosis not present

## 2021-12-29 DIAGNOSIS — N2581 Secondary hyperparathyroidism of renal origin: Secondary | ICD-10-CM | POA: Diagnosis not present

## 2021-12-29 DIAGNOSIS — E1122 Type 2 diabetes mellitus with diabetic chronic kidney disease: Secondary | ICD-10-CM | POA: Diagnosis not present

## 2021-12-30 DIAGNOSIS — R197 Diarrhea, unspecified: Secondary | ICD-10-CM | POA: Diagnosis not present

## 2021-12-30 DIAGNOSIS — R195 Other fecal abnormalities: Secondary | ICD-10-CM | POA: Diagnosis not present

## 2022-01-01 ENCOUNTER — Encounter: Payer: Self-pay | Admitting: Emergency Medicine

## 2022-01-01 ENCOUNTER — Ambulatory Visit (INDEPENDENT_AMBULATORY_CARE_PROVIDER_SITE_OTHER): Payer: Medicare HMO

## 2022-01-01 ENCOUNTER — Ambulatory Visit
Admission: EM | Admit: 2022-01-01 | Discharge: 2022-01-01 | Disposition: A | Discharge status: Home / Self Care | Payer: Medicare HMO | Attending: Physician Assistant | Admitting: Physician Assistant

## 2022-01-01 DIAGNOSIS — Z7901 Long term (current) use of anticoagulants: Secondary | ICD-10-CM

## 2022-01-01 DIAGNOSIS — W19XXXA Unspecified fall, initial encounter: Secondary | ICD-10-CM

## 2022-01-01 DIAGNOSIS — S0990XA Unspecified injury of head, initial encounter: Secondary | ICD-10-CM

## 2022-01-01 DIAGNOSIS — M25521 Pain in right elbow: Secondary | ICD-10-CM | POA: Diagnosis not present

## 2022-01-01 DIAGNOSIS — T07XXXA Unspecified multiple injuries, initial encounter: Secondary | ICD-10-CM

## 2022-01-01 DIAGNOSIS — M25522 Pain in left elbow: Secondary | ICD-10-CM

## 2022-01-01 DIAGNOSIS — Z043 Encounter for examination and observation following other accident: Secondary | ICD-10-CM | POA: Diagnosis not present

## 2022-01-01 DIAGNOSIS — G9389 Other specified disorders of brain: Secondary | ICD-10-CM | POA: Diagnosis not present

## 2022-01-01 NOTE — ED Provider Notes (Signed)
[facility]    CSN: [id number] Arrival date & time: 01/01/22  1817      History   Chief Complaint Chief Complaint  Patient presents with   Fall   Elbow Pain   Head Injury    HPI Cody Howard is a 87 y.o. male with history of atrial fibrillation-anticoagulated with Eliquis, chronic kidney disease, diabetes, hypertension, hyperlipidemia, arthritis.  Patient presents today with his son for evaluation of injuries that he sustained about 3 hours ago.  Patient reports that he was in a wheelchair and fell back out of the wheelchair, hitting his head on asphalt.  He does not have any swelling or bruising or lacerations/abrasions of his head.  He denies loss of consciousness.  He has not had any headaches.  He says his head is not bothering him and he has not had any dizziness, weakness, nausea/vomiting, vision changes, slurred speech, balance issues and son denies any confusion.  He states that he is most bothered by his elbow pain.  He reports bilateral elbow pain and skin tears on elbows.  He reports that he cannot bend the elbow is due to the pain that it causes.  He has most pain in the left elbow.  He is not reporting any numbness, weakness or tingling.  No other injuries or complaints.  HPI  Past Medical History:  Diagnosis Date   AF (atrial fibrillation) (HCC)    Arthritis    hands   Chronic kidney disease    Diabetes mellitus without complication ([place])    Hyperlipidemia    Hypertension    Prostate enlargement     Patient Active Problem List   Diagnosis Date Noted   Acute rhinosinusitis 12/08/2020   Difficulty walking 04/24/2020   Sleeping difficulty 04/24/2020   Moderate mitral regurgitation 09/12/2019   Status post hip hemiarthroplasty 06/28/2019   Atrial fibrillation, chronic ([place]) 06/27/2019   Depression 06/27/2019   Fall at home, initial encounter 06/27/2019   Closed displaced fracture of left femoral neck ([place]) 06/27/2019   History of anemia  05/31/2019   Benign prostatic hyperplasia with lower urinary tract symptoms 05/31/2019   Cerebral atrophy, mild ([place]) 05/31/2019   Reactive depression 05/31/2019   Essential hypertension 05/31/2019   Hiatal hernia 05/31/2019   Cardiac syncope 02/20/2019   Loss of memory 05/25/2018   Dizziness 04/12/2018   TIA (transient ischemic attack) 40/98/1191   Chronic systolic CHF (congestive heart failure), NYHA class 3 ([place]) 11/29/2017   CKD (chronic kidney disease) stage 3, GFR 30-59 ml/min (HCC) 08/18/2017   Bradycardia 08/18/2017   Atrial flutter, paroxysmal ([place]) 07/21/2017   Functional dyspnea 07/06/2017   Cardiomyopathy ([place]) 07/06/2017   Hyperlipidemia, mixed 06/20/2017   Hyperlipidemia associated with type 2 diabetes mellitus ([place]) 04/06/2016   Type II diabetes mellitus with renal manifestations ([place]) 02/10/2015   Personal history of other diseases of male genital organs 02/10/2015   Sebaceous cyst 08/28/2014    Past Surgical History:  Procedure Laterality Date   COLONOSCOPY N/A 02/26/2015   Procedure: COLONOSCOPY;  Surgeon: Hulen Luster, MD;  Location: [facility];  Service: Gastroenterology;  Laterality: N/A;   COLONOSCOPY WITH PROPOFOL N/A 11/02/2017   Procedure: COLONOSCOPY WITH PROPOFOL;  Surgeon: Toledo, Benay Pike, MD;  Location: [facility];  Service: Gastroenterology;  Laterality: N/A;   ESOPHAGOGASTRODUODENOSCOPY N/A 02/26/2015   Procedure: ESOPHAGOGASTRODUODENOSCOPY (EGD);  Surgeon: Hulen Luster, MD;  Location: [facility];  Service: Gastroenterology;  Laterality: N/A;  Diabetic - oral meds  ESOPHAGOGASTRODUODENOSCOPY (EGD) WITH PROPOFOL N/A 11/02/2017   Procedure: ESOPHAGOGASTRODUODENOSCOPY (EGD) WITH PROPOFOL;  Surgeon: Toledo, Benay Pike, MD;  Location: [facility];  Service: Gastroenterology;  Laterality: N/A;   HIP ARTHROPLASTY Left 06/28/2019   Procedure: ARTHROPLASTY BIPOLAR HIP (HEMIARTHROPLASTY);  Surgeon: Corky Mull, MD;  Location: [facility];   Service: Orthopedics;  Laterality: Left;   KIDNEY SURGERY Right    surgery when 87 years old   POLYPECTOMY  02/26/2015   Procedure: POLYPECTOMY;  Surgeon: Hulen Luster, MD;  Location: [facility];  Service: Gastroenterology;;   sebaceous cyst removal Right    located right of spine on upper back       Home Medications    Prior to Admission medications   Medication Sig Start Date End Date Taking? Authorizing Provider  apixaban (ELIQUIS) 2.5 MG TABS tablet Take by mouth. 10/22/21  Yes [provider]  donepezil (ARICEPT) 10 MG tablet Take 1 tablet (10 mg total) by mouth daily. Chipper Herb 06/12/19  Yes [name], MD  ferrous sulfate 325 (65 FE) MG tablet Take 1 tablet (325 mg total) by mouth daily with breakfast. 12/01/21  Yes [name], MD  furosemide (LASIX) 20 MG tablet Take 1 tablet (20 mg total) by mouth daily. 12/01/21  Yes [name], MD  glipiZIDE (GLUCOTROL) 5 MG tablet Take 1 tablet (5 mg total) by mouth 2 (two) times daily before a meal. 10/01/21  Yes [name], MD  losartan (COZAAR) 100 MG tablet Take 0.5 tablets (50 mg total) by mouth daily. 12/01/21  Yes [name], MD  montelukast (SINGULAIR) 10 MG tablet TAKE 1 TABLET BY MOUTH EVERYDAY AT BEDTIME 11/24/21  Yes [name], MD  sertraline (ZOLOFT) 25 MG tablet Take 1 tablet (25 mg total) by mouth daily. 12/01/21  Yes [name], MD  simvastatin (ZOCOR) 80 MG tablet Take 1 tablet (80 mg total) by mouth every evening. 06/01/21  Yes [name], MD  carvedilol (COREG) 3.125 MG tablet Take 1 tablet (3.125 mg total) by mouth 2 (two) times daily with a meal. 10/01/21   [name], MD  Cholecalciferol 125 MCG (5000 UT) TABS Take 1 tablet (5,000 Units total) by mouth daily. 12/01/21   [name], MD  glucose blood (ONETOUCH ULTRA) test strip USE TO TEST ONCE A DAY 11/24/21   [name], MD  ibuprofen (ADVIL) 200 MG tablet Take 200 mg by mouth every 6 (six) hours as needed.     [provider]  Lancets (ONETOUCH DELICA PLUS TMHDQQ22L) MISC USE TO TEST ONCE DAILY 12/14/19   [name], MD  Probiotic Product (ALIGN) 4 MG CAPS Take 1 capsule (4 mg total) by mouth at bedtime. 12/01/21   [name], MD  warfarin (COUMADIN) 4 MG tablet Take 4 mg by mouth daily. 12/08/20   [provider]    Family History Family History  Problem Relation Age of Onset   Diabetes Father    Heart disease Father     Social History Social History   Tobacco Use   Smoking status: Former    Types: Cigarettes    Quit date: 03/08/1978    Years since quitting: 43.8   Smokeless tobacco: Never  Vaping Use   Vaping Use: Never used  Substance Use Topics   Alcohol use: Not Currently   Drug use: Never     Allergies   Patient has no known allergies.   Review of  Systems Review of Systems  Constitutional:  Negative for fatigue.  Eyes:  Negative for photophobia and visual disturbance.  Respiratory:  Negative for shortness of breath.   Cardiovascular:  Negative for chest pain.  Gastrointestinal:  Negative for abdominal pain, nausea and vomiting.  Musculoskeletal:  Positive for arthralgias and joint swelling. Negative for back pain, gait problem and neck pain.  Skin:  Positive for wound.  Neurological:  Negative for dizziness, tremors, seizures, syncope, facial asymmetry, speech difficulty, weakness, light-headedness, numbness and headaches.  Hematological:  Bruises/bleeds easily (on anticoagulants).  Psychiatric/Behavioral:  Negative for agitation, behavioral problems and confusion.      Physical Exam Triage Vital Signs ED Triage Vitals  Enc Vitals Group     BP 01/01/22 1859 (!) 159/73     Pulse Rate 01/01/22 1859 63     Resp 01/01/22 1859 16     Temp 01/01/22 1859 98.2 F (36.8 C)     Temp Source 01/01/22 1859 Oral     SpO2 01/01/22 1859 100 %     Weight 01/01/22 1856 198 lb (89.8 kg)     Height 01/01/22 1856 '5\' 10"'$  (1.778 m)     Head Circumference  --      Peak Flow --      Pain Score 01/01/22 1855 5     Pain Loc --      Pain Edu? --      Excl. in [place]? --    No data found.  Updated Vital Signs BP (!) 159/73 (BP Location: Left Arm)   Pulse 63   Temp 98.2 F (36.8 C) (Oral)   Resp 16   Ht '5\' 10"'$  (1.778 m)   Wt 198 lb (89.8 kg)   SpO2 100%   BMI 28.41 kg/m   Physical Exam Vitals and nursing note reviewed.  Constitutional:      General: He is not in acute distress.    Appearance: Normal appearance. He is well-developed. He is not ill-appearing.  HENT:     Head: Normocephalic and atraumatic.     Right Ear: Tympanic membrane, ear canal and external ear normal.     Left Ear: Tympanic membrane, ear canal and external ear normal.     Nose: Nose normal.     Mouth/Throat:     Mouth: Mucous membranes are moist.     Pharynx: Oropharynx is clear.  Eyes:     General: No scleral icterus.    Extraocular Movements: Extraocular movements intact.     Conjunctiva/sclera: Conjunctivae normal.     Pupils: Pupils are equal, round, and reactive to light.  Cardiovascular:     Rate and Rhythm: Normal rate and regular rhythm.     Heart sounds: Normal heart sounds.  Pulmonary:     Effort: Pulmonary effort is normal. No respiratory distress.     Breath sounds: Normal breath sounds.  Abdominal:     Palpations: Abdomen is soft.     Tenderness: There is no abdominal tenderness.  Musculoskeletal:     Right elbow: Swelling and laceration present. No deformity or effusion. Decreased range of motion. Tenderness present in radial head and medial epicondyle.     Left elbow: Swelling and laceration present. No deformity or effusion. Decreased range of motion. Tenderness present in medial epicondyle.     Cervical back: Neck supple.     Comments: Multiple tiny abrasions and skin tears of posterior bilateral elbows with minimal bleeding and band-aids covering wounds  Skin:    General: Skin is  warm and dry.     Capillary Refill: Capillary refill  takes less than 2 seconds.  Neurological:     General: No focal deficit present.     Mental Status: He is alert and oriented to person, place, and time. Mental status is at baseline.     Cranial Nerves: No cranial nerve deficit.     Motor: No weakness.     Coordination: Coordination normal.     Gait: Gait normal.  Psychiatric:        Mood and Affect: Mood normal.        Behavior: Behavior normal.      UC Treatments / Results  Labs (all labs ordered are listed, but only abnormal results are displayed) Labs Reviewed - No data to display  EKG   Radiology DG Elbow Complete Left  Result Date: 01/01/2022 CLINICAL DATA:  Status post fall. EXAM: LEFT ELBOW - COMPLETE 3+ VIEW COMPARISON:  None Available. FINDINGS: There is no evidence of acute fracture, dislocation, or joint effusion. Mild chronic changes are seen along the medial epicondyle of the distal left humerus and right coronoid process. Soft tissues are unremarkable. IMPRESSION: Mild chronic changes without evidence of an acute osseous abnormality. Electronically Signed   By: Virgina Norfolk M.D.   On: 01/01/2022 19:56   DG Elbow Complete Right  Result Date: 01/01/2022 CLINICAL DATA:  Fall. EXAM: RIGHT ELBOW - COMPLETE 3+ VIEW COMPARISON:  None Available. FINDINGS: There is no evidence of fracture, dislocation, or joint effusion. Well corticated densities adjacent to the medial and lateral epicondyles are likely related to old injury. Soft tissues are unremarkable. IMPRESSION: No acute fracture or dislocation. Electronically Signed   By: Ronney Asters M.D.   On: 01/01/2022 19:51   CT Head Wo Contrast  Result Date: 01/01/2022 CLINICAL DATA:  Status post fall. EXAM: CT HEAD WITHOUT CONTRAST TECHNIQUE: Contiguous axial images were obtained from the base of the skull through the vertex without intravenous contrast. RADIATION DOSE REDUCTION: This exam was performed according to the departmental dose-optimization program which  includes automated exposure control, adjustment of the mA and/or kV according to patient size and/or use of iterative reconstruction technique. COMPARISON:  None Available. FINDINGS: Brain: There is mild to moderate severity cerebral atrophy with widening of the extra-axial spaces and ventricular dilatation. There are areas of decreased attenuation within the white matter tracts of the supratentorial brain, consistent with microvascular disease changes. Vascular: No hyperdense vessel or unexpected calcification. Skull: Normal. Negative for fracture or focal lesion. Sinuses/Orbits: No acute finding. Other: None. IMPRESSION: 1. No acute intracranial abnormality. 2. Generalized cerebral atrophy and microvascular disease changes of the supratentorial brain. Electronically Signed   By: Virgina Norfolk M.D.   On: 01/01/2022 19:39    Procedures Procedures (including critical care time)  Medications Ordered in UC Medications - No data to display  Initial Impression / Assessment and Plan / UC Course  I have reviewed the triage vital signs and the nursing notes.  Pertinent labs & imaging results that were available during my care of the patient were reviewed by me and considered in my medical decision making (see chart for details).   87 year old male presents with son for evaluation of bilateral elbow pain, lacerations of elbows and head injury after falling back out of his wheelchair on 2 asphalt.  He is reporting most pain and discomfort in his elbows especially the left elbow.  Decreased range of motion of both elbows.  Small superficial lacerations/skin tears of bilateral elbows  which are covered with Band-Aids.  Up-to-date with immunizations as far patient is aware.  He does have history of atrial fibrillation and is on Eliquis.  He is denying any loss of consciousness or headaches.  He does not have any head swelling or lacerations of scalp.  No bruising.  He denies any dizziness, weakness,  nausea/vomiting, confusion and he has not had any speech or balance problems.  BP is elevated at 159/73.  Other vitals normal and stable.  On exam the lacerations and skin tears are small on both elbows.  They are cleaned by nursing staff and bacitracin is applied as well as nonadherent pads and loosely wrapped Coban.  There is no swelling of his head or lacerations or abrasions of the scalp.  Normal cranial nerve exam.  Slow, otherwise normal gait.  No change from baseline.  Alert and oriented x3.  GCS 15.  Ordered x-rays of bilateral elbows as well as a CT of head since he did hit his head and is on an anticoagulant.  X-rays do not show any acute fractures or dislocations.  CT scan does not show any acute abnormality.  Reviewed all imaging results with patient and his son.  Patient and son are relieved with the results.  Reviewed RICE guidelines with patient and advised him to take Tylenol for pain and we also discussed wound care and following up for any signs of infection or worsening elbow pain.  We also discussed care of head injury and advised him if he begins to have a severe headache, dizziness, weakness, numbness/tingling, nausea/vomiting or difficulty with speech or walking, facial drooping, etc. needs to call 911 immediately and go to emergency department, but his head injury appears to be minor.   Final Clinical Impressions(s) / UC Diagnoses   Final diagnoses:  Bilateral elbow joint pain  Injury of head, initial encounter  Fall, initial encounter  Multiple lacerations  Long term current use of anticoagulant therapy     Discharge Instructions      -No fractures of the elbows.  The head CT was without any acute abnormality. - Ice the elbow is and take Tylenol for pain relief. - Make sure to change the bandages every day.  Clean the wounds with soap and water and may apply Neosporin.  Return if increased swelling, redness of the skin, increased pain, fever, pustular drainage or  signs of infection. - Although the CT is normal, if you do start to have any headaches, dizziness, nausea/vomiting, confusion or start to have an abnormal gait or speech, numbness/weakness or tingling, please call 911 or have someone take immediately to the ER for further evaluation.  Appears to be a minor head injury.     ED Prescriptions   None    PDMP not reviewed this encounter.   Danton Clap, PA-C 01/02/22 [phone]

## 2022-01-01 NOTE — Discharge Instructions (Addendum)
-  No fractures of the elbows.  The head CT was without any acute abnormality. - Ice the elbow is and take Tylenol for pain relief. - Make sure to change the bandages every day.  Clean the wounds with soap and water and may apply Neosporin.  Return if increased swelling, redness of the skin, increased pain, fever, pustular drainage or signs of infection. - Although the CT is normal, if you do start to have any headaches, dizziness, nausea/vomiting, confusion or start to have an abnormal gait or speech, numbness/weakness or tingling, please call 911 or have someone take immediately to the ER for further evaluation.  Appears to be a minor head injury.

## 2022-01-01 NOTE — ED Triage Notes (Signed)
Patient states that his wheelchair hit a rock or bump and he fell back out of the wheelchair today around 4 pm.  Patient states that he hit the back of his head and both elbows on the asphalt.  Patient denies LOC.  Patient denies any pain in his head.  Patient does reports pain and abrasion to both elbows.

## 2022-02-03 ENCOUNTER — Ambulatory Visit (INDEPENDENT_AMBULATORY_CARE_PROVIDER_SITE_OTHER): Payer: Medicare HMO

## 2022-02-03 DIAGNOSIS — Z0000 Encounter for general adult medical examination without abnormal findings: Secondary | ICD-10-CM | POA: Diagnosis not present

## 2022-02-03 NOTE — Progress Notes (Signed)
Subjective:   Cody Howard is a 87 y.o. male who presents for Medicare Annual/Subsequent preventive examination.  I connected with  Cody Howard on 02/03/22 by a audio enabled telemedicine application and verified that I am speaking with the correct person using two identifiers.  Patient Location: Home  Provider Location: Office/Clinic  I discussed the limitations of evaluation and management by telemedicine. The patient expressed understanding and agreed to proceed.   Review of Systems    Defer to PCP Cardiac Risk Factors include: advanced age (>38mn, >>36women);diabetes mellitus     Objective:    There were no vitals filed for this visit. There is no height or weight on file to calculate BMI.     02/03/2022    1:18 PM 01/01/2022    6:58 PM 02/02/2021    1:31 PM 01/28/2020    1:43 PM 07/23/2019    4:00 PM 06/28/2019    9:44 AM 06/27/2019    4:59 PM  Advanced Directives  Does Patient Have a Medical Advance Directive? Yes Yes Yes Yes Yes No No  Type of Advance Directive Living will [place] will    Does patient want to make changes to medical advance directive?     No - Patient declined    Copy of [place] Chart?   No - copy requested No - copy requested     Would patient like information on creating a medical advance directive?     No - Patient declined No - Patient declined No - Patient declined    Current Medications (verified) Outpatient Encounter Medications as of 02/03/2022  Medication Sig   apixaban (ELIQUIS) 2.5 MG TABS tablet Take by mouth.   carvedilol (COREG) 3.125 MG tablet Take 1 tablet (3.125 mg total) by mouth 2 (two) times daily with a meal.   Cholecalciferol 125 MCG (5000 UT) TABS Take 1 tablet (5,000 Units total) by mouth daily.   donepezil (ARICEPT) 10 MG tablet Take 1 tablet  (10 mg total) by mouth daily. AChipper Herb  ferrous sulfate 325 (65 FE) MG tablet Take 1 tablet (325 mg total) by mouth daily with breakfast.   furosemide (LASIX) 20 MG tablet Take 1 tablet (20 mg total) by mouth daily.   glipiZIDE (GLUCOTROL) 5 MG tablet Take 1 tablet (5 mg total) by mouth 2 (two) times daily before a meal.   glucose blood (ONETOUCH ULTRA) test strip USE TO TEST ONCE A DAY   ibuprofen (ADVIL) 200 MG tablet Take 200 mg by mouth every 6 (six) hours as needed.   Lancets (ONETOUCH DELICA PLUS [id number] MISC USE TO TEST ONCE DAILY   losartan (COZAAR) 100 MG tablet Take 0.5 tablets (50 mg total) by mouth daily.   montelukast (SINGULAIR) 10 MG tablet TAKE 1 TABLET BY MOUTH EVERYDAY AT BEDTIME   Probiotic Product (ALIGN) 4 MG CAPS Take 1 capsule (4 mg total) by mouth at bedtime.   sertraline (ZOLOFT) 25 MG tablet Take 1 tablet (25 mg total) by mouth daily.   simvastatin (ZOCOR) 80 MG tablet Take 1 tablet (80 mg total) by mouth every evening.   warfarin (COUMADIN) 4 MG tablet Take 4 mg by mouth daily.   No facility-administered encounter medications on file as of 02/03/2022.    Allergies (verified) Patient has no known allergies.   History: Past Medical History:  Diagnosis Date  AF (atrial fibrillation) (HCC)    Arthritis    hands   Chronic kidney disease    Diabetes mellitus without complication ([place])    Hyperlipidemia    Hypertension    Prostate enlargement    Past Surgical History:  Procedure Laterality Date   COLONOSCOPY N/A 02/26/2015   Procedure: COLONOSCOPY;  Surgeon: Hulen Luster, MD;  Location: [place];  Service: Gastroenterology;  Laterality: N/A;   COLONOSCOPY WITH PROPOFOL N/A 11/02/2017   Procedure: COLONOSCOPY WITH PROPOFOL;  Surgeon: Toledo, Benay Pike, MD;  Location: [facility];  Service: Gastroenterology;  Laterality: N/A;   ESOPHAGOGASTRODUODENOSCOPY N/A 02/26/2015   Procedure: ESOPHAGOGASTRODUODENOSCOPY (EGD);  Surgeon: Hulen Luster, MD;   Location: [place];  Service: Gastroenterology;  Laterality: N/A;  Diabetic - oral meds   ESOPHAGOGASTRODUODENOSCOPY (EGD) WITH PROPOFOL N/A 11/02/2017   Procedure: ESOPHAGOGASTRODUODENOSCOPY (EGD) WITH PROPOFOL;  Surgeon: Toledo, Benay Pike, MD;  Location: [facility];  Service: Gastroenterology;  Laterality: N/A;   HIP ARTHROPLASTY Left 06/28/2019   Procedure: ARTHROPLASTY BIPOLAR HIP (HEMIARTHROPLASTY);  Surgeon: Corky Mull, MD;  Location: [facility];  Service: Orthopedics;  Laterality: Left;   KIDNEY SURGERY Right    surgery when 87 years old   POLYPECTOMY  02/26/2015   Procedure: POLYPECTOMY;  Surgeon: Hulen Luster, MD;  Location: [place];  Service: Gastroenterology;;   sebaceous cyst removal Right    located right of spine on upper back   Family History  Problem Relation Age of Onset   Diabetes Father    Heart disease Father    Social History   Socioeconomic History   Marital status: Married    Spouse name: Nikolaj Geraghty   Number of children: 2   Years of education: 14   Highest education level: Associate degree: occupational, Hotel manager, or vocational program  Occupational History   Occupation: retired  Tobacco Use   Smoking status: Former    Types: Cigarettes    Quit date: 03/08/1978    Years since quitting: 43.9   Smokeless tobacco: Never  Vaping Use   Vaping Use: Never used  Substance and Sexual Activity   Alcohol use: Not Currently   Drug use: Never   Sexual activity: Not Currently    Partners: Female  Other Topics Concern   Not on file  Social History Narrative   Not on file   Social Determinants of Health   Financial Resource Strain: [place]  (02/02/2021)   Overall Financial Resource Strain (CARDIA)    Difficulty of Paying Living Expenses: Not hard at all  Food Insecurity: No Food Insecurity (02/03/2022)   Hunger Vital Sign    Worried About Running Out of Food in the Last Year: Never true    [place] in the Last Year: Never  true  Transportation Needs: No Transportation Needs (02/02/2021)   PRAPARE - Hydrologist (Medical): No    Lack of Transportation (Non-Medical): No  Physical Activity: Inactive (02/03/2022)   Exercise Vital Sign    Days of Exercise per Week: 0 days    Minutes of Exercise per Session: 0 min  Stress: No Stress Concern Present (02/03/2022)   [place]    Feeling of Stress : Not at all  Social Connections: Moderately Integrated (02/03/2022)   Social Connection and Isolation Panel [NHANES]    Frequency of Communication with Friends and Family: More than three times a week    Frequency  of Social Gatherings with Friends and Family: More than three times a week    Attends Religious Services: More than 4 times per year    Active Member of [employer] or Organizations: No    Attends Archivist Meetings: Never    Marital Status: Married    Tobacco Counseling Counseling given: Not Answered   Clinical Intake:  Pre-visit preparation completed: Yes  Pain : No/denies pain     Diabetes: Yes     Diabetic? Yes.     Information entered by :: Wyatt Haste, [place] of Daily Living    02/03/2022    1:19 PM  In your present state of health, do you have any difficulty performing the following activities:  Hearing? 0  Vision? 0  Difficulty concentrating or making decisions? 1  Walking or climbing stairs? 0  Dressing or bathing? 0  Doing errands, shopping? 0  Preparing Food and eating ? N  Using the Toilet? N  In the past six months, have you accidently leaked urine? Y  Do you have problems with loss of bowel control? Y  Managing your Medications? N  Managing your Finances? N  Housekeeping or managing your Housekeeping? N    Patient Care Team: [name], MD as PCP - General (Family Medicine) Jannifer Franklin, NP as Nurse Practitioner (Neurology) Idelle Leech,  [place] (Optometry) Corey Skains, MD as Consulting Physician (Cardiology)  Indicate any recent Medical Services you may have received from other than [facility] providers in the past year (date may be approximate).     Assessment:   This is a routine wellness examination for [place].  Hearing/Vision screen Hearing Screening - Comments:: No concerns. Vision Screening - Comments:: Wears glasses to read.  Dietary issues and exercise activities discussed: Current Exercise Habits: The patient does not participate in regular exercise at present   Goals Addressed   None   Depression Screen    02/03/2022    1:17 PM 10/01/2021    1:55 PM 06/01/2021    1:45 PM 02/02/2021    1:30 PM 12/08/2020    5:03 PM 11/28/2020    2:45 PM 06/04/2020    9:07 AM  PHQ 2/9 Scores  PHQ - 2 Score 0 0 0 0 0 0 1  PHQ- 9 Score 0 0 0 0 0 0 3    Fall Risk    02/03/2022    1:18 PM 10/01/2021    1:46 PM 06/01/2021    1:46 PM 02/02/2021    1:32 PM 12/08/2020    5:03 PM  [place] in the past year? 0 1 1 0 0  Number falls in past yr: 0  1 0 0  Injury with Fall? 0  0 0 0  Risk for fall due to : No Fall Risks  Impaired balance/gait No Fall Risks No Fall Risks  Follow up Falls evaluation completed  Falls evaluation completed Falls prevention discussed Falls evaluation completed    [place]:  Any stairs in or around the home? No  If so, are there any without handrails?  N/A Home free of loose throw rugs in walkways, pet beds, electrical cords, etc? Yes  Adequate lighting in your home to reduce risk of falls? Yes   ASSISTIVE DEVICES UTILIZED TO PREVENT FALLS:  Life alert? No  Use of a cane, walker or w/c? Yes  Grab bars in the bathroom? Yes  Shower chair or bench  in shower? Yes  Elevated toilet seat or a handicapped toilet? No   Cognitive Function:        02/03/2022    1:19 PM 05/10/2018   11:55 AM  6CIT Screen  What Year? 0 points 0 points  What month? 0 points  0 points  What time? 0 points 0 points  Count back from 20 0 points 0 points  Months in reverse 0 points 4 points  Repeat phrase 10 points 4 points  Total Score 10 points 8 points    Immunizations Immunization History  Administered Date(s) Administered   Fluad Quad(high Dose 65+) 01/11/2020   Influenza,inj,quad, With Preservative 12/17/2017   Influenza-Unspecified 12/07/2018   PFIZER(Purple Top)SARS-COV-2 Vaccination 05/02/2019, 05/23/2019, 02/16/2020   Pneumococcal Conjugate-13 05/09/2018   Pneumococcal Polysaccharide-23 07/31/2010   Tdap 11/29/2014, 06/16/2019    TDAP status: Up to date  Flu Vaccine status: Up to date  Pneumococcal vaccine status: Up to date  Covid-19 vaccine status: Completed vaccines  Qualifies for Shingles Vaccine? Yes   Zostavax completed No   Shingrix Completed?: No.    Education has been provided regarding the importance of this vaccine. Patient has been advised to call insurance company to determine out of pocket expense if they have not yet received this vaccine. Advised may also receive vaccine at local pharmacy or Health Dept. Verbalized acceptance and understanding.  Screening Tests Health Maintenance  Topic Date Due   Zoster Vaccines- Shingrix (1 of 2) Never done   INFLUENZA VACCINE  10/06/2021   COVID-19 Vaccine (4 - 2023-24 season) 11/06/2021   OPHTHALMOLOGY EXAM  01/26/2022   HEMOGLOBIN A1C  03/24/2022   Diabetic kidney evaluation - GFR measurement  06/02/2022   FOOT EXAM  06/02/2022   Diabetic kidney evaluation - Urine ACR  12/30/2022   Medicare Annual Wellness (AWV)  02/04/2023   Pneumonia Vaccine 42+ Years old  Completed   HPV VACCINES  Aged Out    Health Maintenance  Health Maintenance Due  Topic Date Due   Zoster Vaccines- Shingrix (1 of 2) Never done   INFLUENZA VACCINE  10/06/2021   COVID-19 Vaccine (4 - 2023-24 season) 11/06/2021   OPHTHALMOLOGY EXAM  01/26/2022    Colorectal cancer screening: No longer required.    Lung Cancer Screening: (Low Dose CT Chest recommended if Age 80-80 years, 30 pack-year currently smoking OR have quit w/in 15years.) does not qualify.   Additional Screening:  Hepatitis C Screening: does not qualify.  Vision Screening: Recommended annual ophthalmology exams for early detection of glaucoma and other disorders of the eye. Is the patient up to date with their annual eye exam?  Yes  Who is the provider or what is the name of the office in which the patient attends annual eye exams? [facility]  If pt is not established with a provider, would they like to be referred to a provider to establish care?  N/A .   Dental Screening: Recommended annual dental exams for proper oral hygiene  Community Resource Referral / Chronic Care Management: CRR required this visit?  No   CCM required this visit?  No      Plan:     I have personally reviewed and noted the following in the patient's chart:   Medical and social history Use of alcohol, tobacco or illicit drugs  Current medications and supplements including opioid prescriptions. Patient is not currently taking opioid prescriptions. Functional ability and status Nutritional status Physical activity Advanced directives List of other physicians  Hospitalizations, surgeries, and ER visits in previous 12 months Vitals Screenings to include cognitive, depression, and falls Referrals and appointments  In addition, I have reviewed and discussed with patient certain preventive protocols, quality metrics, and best practice recommendations. A written personalized care plan for preventive services as well as general preventive health recommendations were provided to patient.     Clista Bernhardt, [place]   02/03/2022   Nurse Notes: None.

## 2022-02-09 ENCOUNTER — Other Ambulatory Visit: Payer: Self-pay | Admitting: Family Medicine

## 2022-02-09 NOTE — Telephone Encounter (Signed)
Requested Prescriptions  Pending Prescriptions Disp Refills   losartan (COZAAR) 100 MG tablet [Pharmacy Med Name: LOSARTAN POTASSIUM 100 MG TAB] 45 tablet 0    Sig: TAKE 1/2 TABLET BY MOUTH DAILY.     Cardiovascular:  Angiotensin Receptor Blockers Failed - 02/09/2022 12:58 PM      Failed - Cr in normal range and within 180 days    Creatinine, Ser  Date Value Ref Range Status  06/01/2021 2.30 (H) 0.76 - 1.27 mg/dL Final         Failed - K in normal range and within 180 days    Potassium  Date Value Ref Range Status  06/01/2021 5.1 3.5 - 5.2 mmol/L Final         Failed - Last BP in normal range    BP Readings from Last 1 Encounters:  01/01/22 (!) 159/73         Passed - Patient is not pregnant      Passed - Valid encounter within last 6 months    Recent Outpatient Visits           4 months ago Cardiomyopathy, unspecified type ([place])   [facility] [place], Deanna C, MD   8 months ago Essential hypertension   [place] [facility] [place], Deanna C, MD   1 year ago Type 2 diabetes mellitus without complication, without long-term current use of insulin ([place])   [place] [facility] [place], Deanna C, MD   1 year ago Acute rhinosinusitis   [facility] [place], Earley Abide, MD   1 year ago Chronic rhinitis    [facility] at [place], Deanna C, MD

## 2022-02-25 IMAGING — CR DG CHEST 1V
1 series · 1 of 1 positions shown · non-contrast
Comparison: 06/28/2016

CLINICAL DATA: Fall with left hip fracture.

EXAM:
CHEST  1 VIEW

[dg chest 1 view]
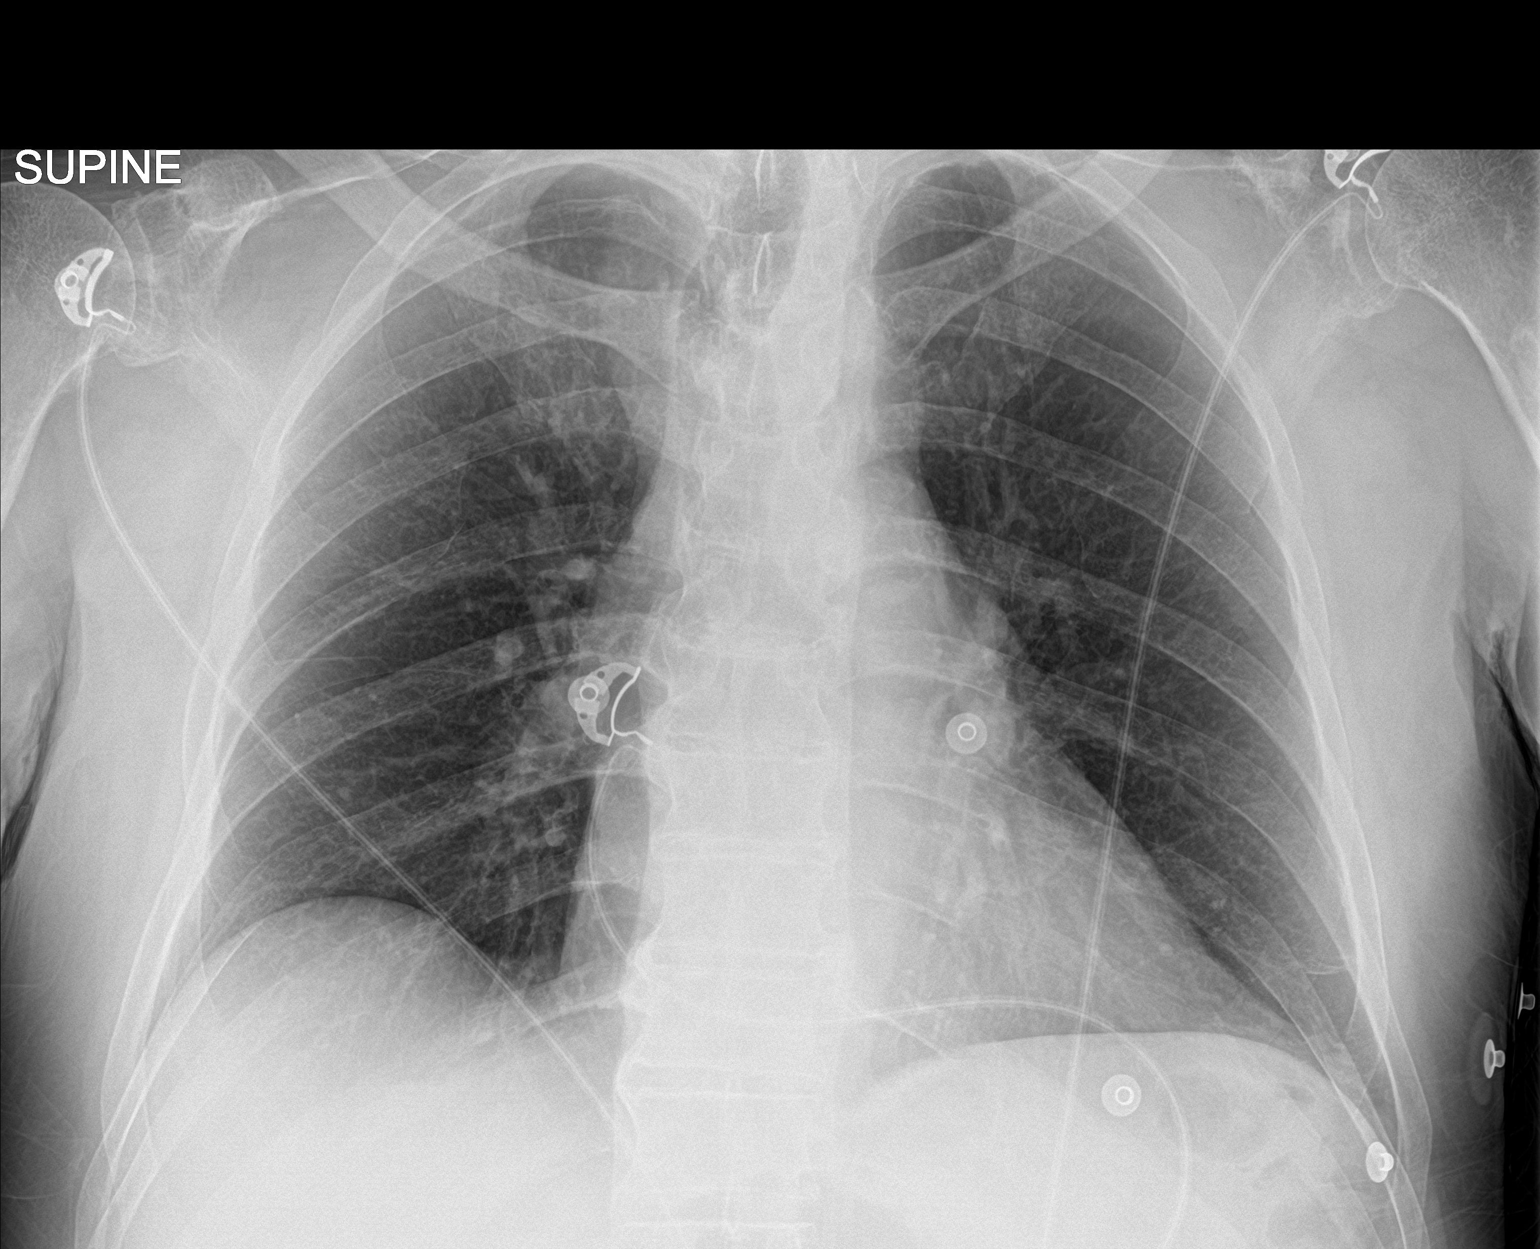

[1 of 1 positions shown; findings below may reference images not displayed]

FINDINGS: Cardiac silhouette is normal in size. No mediastinal or hilar
masses. No evidence of adenopathy.

Clear lungs.  No pleural effusion or pneumothorax.

Skeletal structures are grossly intact.
IMPRESSION: No active disease.

## 2022-02-25 IMAGING — CR DG HIP (WITH OR WITHOUT PELVIS) 2-3V*L*
1 series · 3 of 3 positions shown · non-contrast
Comparison: None.

CLINICAL DATA: Fall.  Left hip pain.

EXAM:
DG HIP (WITH OR WITHOUT PELVIS) 2-3V LEFT

[Series 1: dg hip unilat w or w/o pelvis 2-3 views  · non-contrast · 0.14mm/px · 3 of 3 slices shown]
[im 1/3]
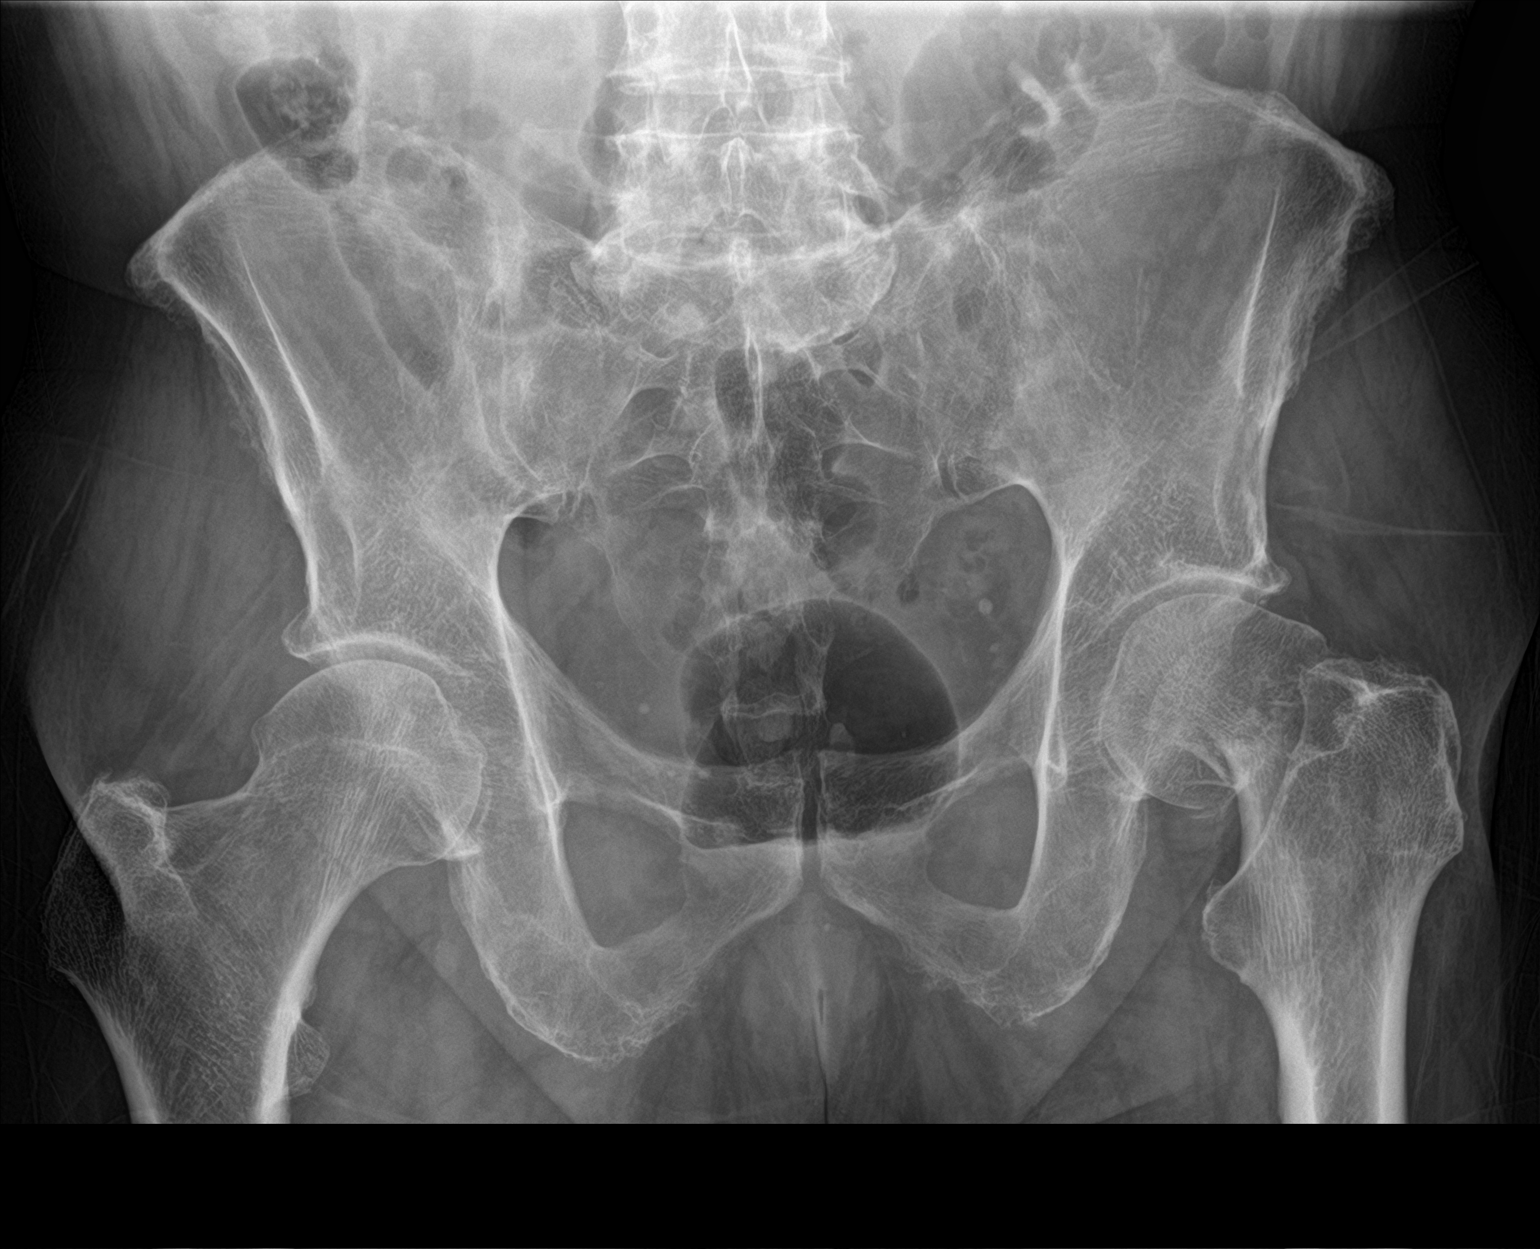
[im 2/3]
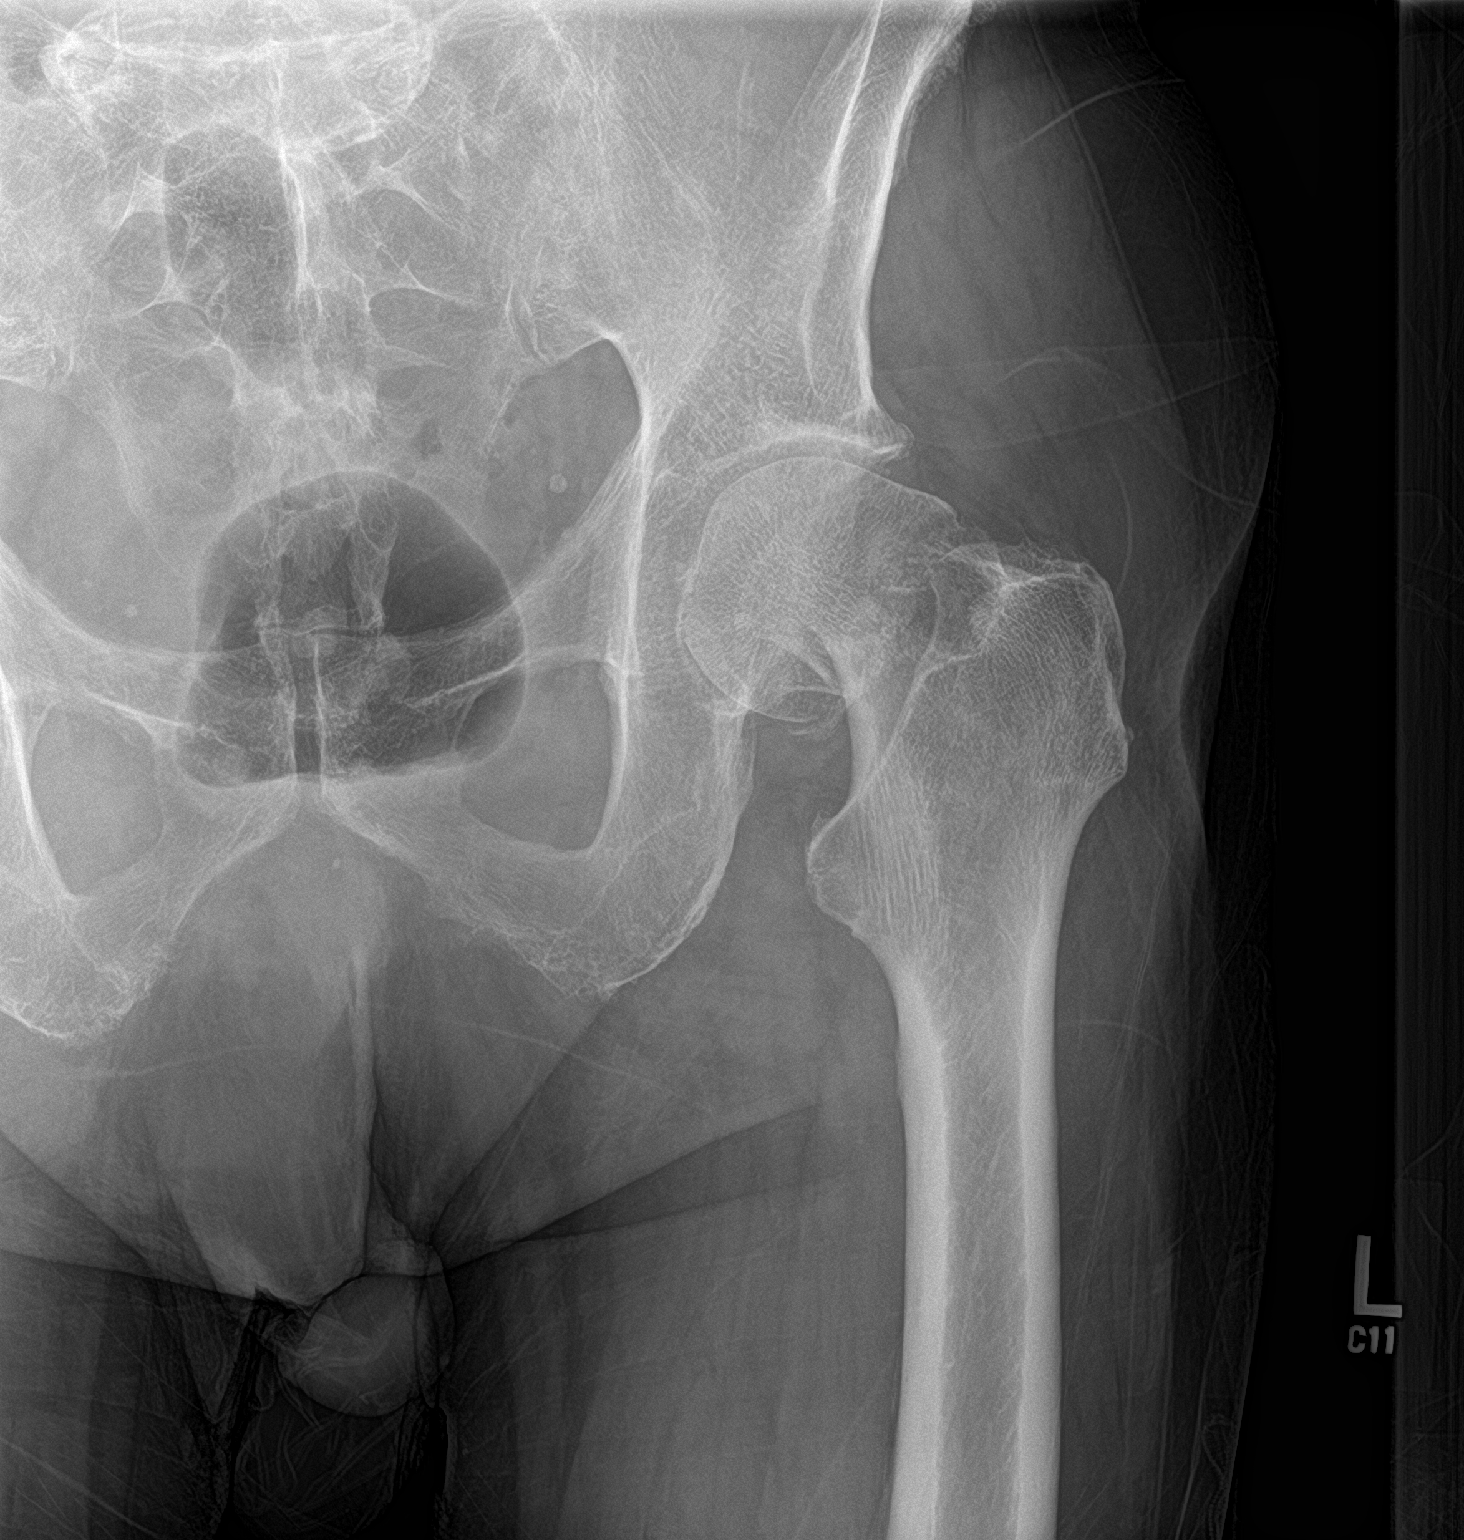
[im 3/3]
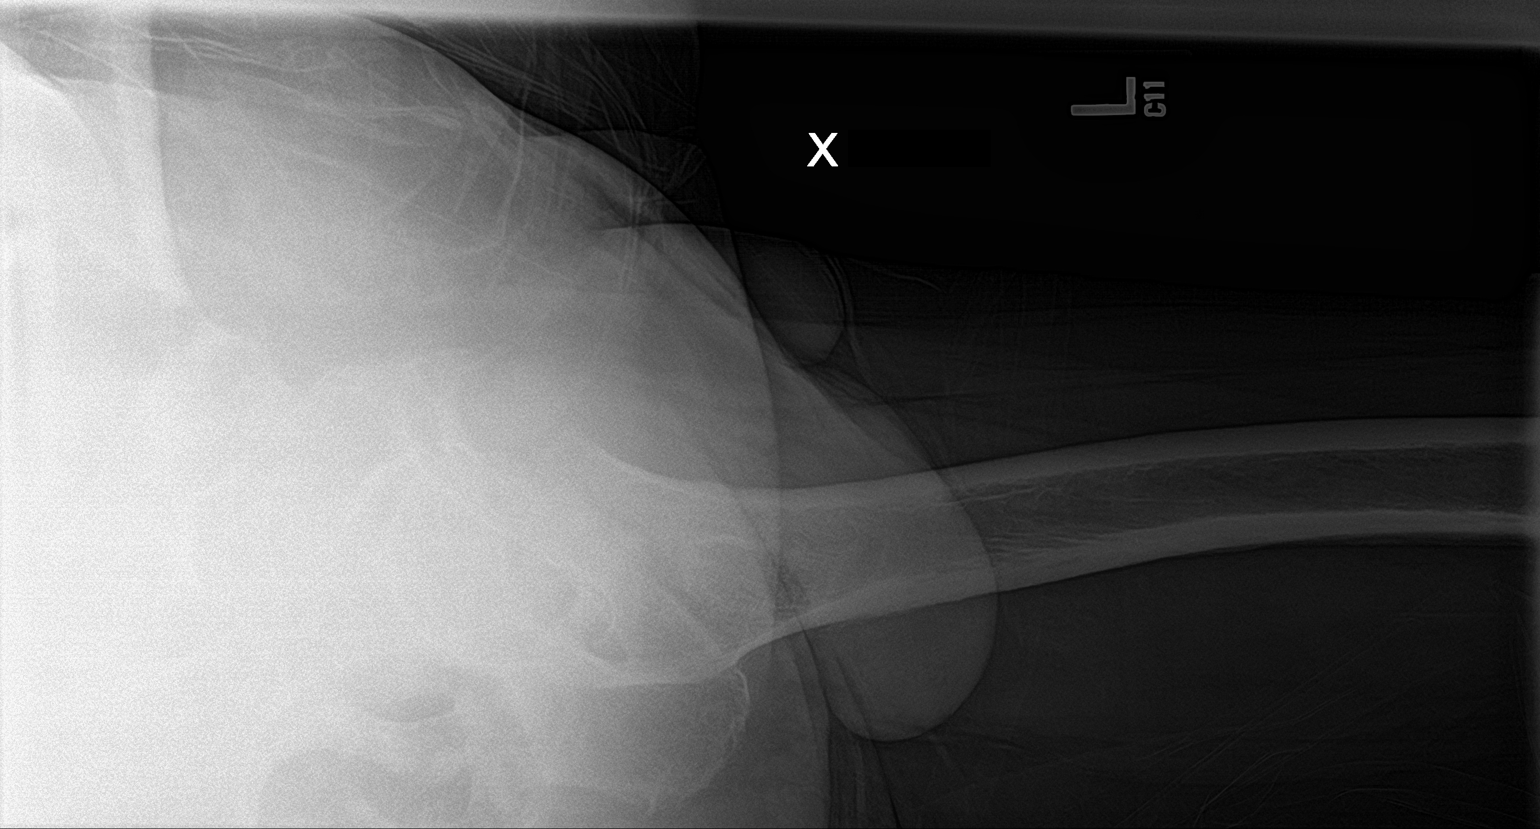

[3 of 3 positions shown; findings below may reference images not displayed]

FINDINGS: Non comminuted mid left femoral neck fracture. Distal fracture
component has mildly displaced superiorly by approximately 1.5 cm.
There is mild varus angulation as well as apex anterior angulation.

No other fractures.  No bone lesions.

Hip joints, SI joints and symphysis pubis are normally aligned.

Soft tissues are unremarkable.
IMPRESSION: 1. Non comminuted, mildly displaced and angulated left femoral neck
fracture. No dislocation.

## 2022-03-25 DIAGNOSIS — R2689 Other abnormalities of gait and mobility: Secondary | ICD-10-CM | POA: Diagnosis not present

## 2022-03-25 DIAGNOSIS — H9190 Unspecified hearing loss, unspecified ear: Secondary | ICD-10-CM | POA: Diagnosis not present

## 2022-03-25 DIAGNOSIS — R413 Other amnesia: Secondary | ICD-10-CM | POA: Diagnosis not present

## 2022-03-26 DIAGNOSIS — I152 Hypertension secondary to endocrine disorders: Secondary | ICD-10-CM | POA: Diagnosis not present

## 2022-03-26 DIAGNOSIS — E1159 Type 2 diabetes mellitus with other circulatory complications: Secondary | ICD-10-CM | POA: Diagnosis not present

## 2022-03-26 DIAGNOSIS — E1165 Type 2 diabetes mellitus with hyperglycemia: Secondary | ICD-10-CM | POA: Diagnosis not present

## 2022-03-26 DIAGNOSIS — E1169 Type 2 diabetes mellitus with other specified complication: Secondary | ICD-10-CM | POA: Diagnosis not present

## 2022-03-26 DIAGNOSIS — E785 Hyperlipidemia, unspecified: Secondary | ICD-10-CM | POA: Diagnosis not present

## 2022-03-30 ENCOUNTER — Telehealth: Payer: Self-pay | Admitting: Family Medicine

## 2022-03-30 NOTE — Telephone Encounter (Signed)
Copied from [place] [id number]. Topic: General - Inquiry >> Mar 30, 2022 11:36 AM Penni Bombard wrote: Reason for CRM: pt's wife called asking Baxter Flattery to call her back regarding her husband being constipated.  CB#  [phone]

## 2022-03-31 DIAGNOSIS — Z9181 History of falling: Secondary | ICD-10-CM | POA: Diagnosis not present

## 2022-03-31 DIAGNOSIS — S81011D Laceration without foreign body, right knee, subsequent encounter: Secondary | ICD-10-CM | POA: Diagnosis not present

## 2022-03-31 DIAGNOSIS — S51011D Laceration without foreign body of right elbow, subsequent encounter: Secondary | ICD-10-CM | POA: Diagnosis not present

## 2022-03-31 DIAGNOSIS — E875 Hyperkalemia: Secondary | ICD-10-CM | POA: Diagnosis not present

## 2022-03-31 DIAGNOSIS — E1169 Type 2 diabetes mellitus with other specified complication: Secondary | ICD-10-CM | POA: Diagnosis not present

## 2022-03-31 DIAGNOSIS — Z8781 Personal history of (healed) traumatic fracture: Secondary | ICD-10-CM | POA: Diagnosis not present

## 2022-03-31 DIAGNOSIS — F32A Depression, unspecified: Secondary | ICD-10-CM | POA: Diagnosis not present

## 2022-03-31 DIAGNOSIS — I5022 Chronic systolic (congestive) heart failure: Secondary | ICD-10-CM | POA: Diagnosis not present

## 2022-03-31 DIAGNOSIS — Z7984 Long term (current) use of oral hypoglycemic drugs: Secondary | ICD-10-CM | POA: Diagnosis not present

## 2022-03-31 DIAGNOSIS — I480 Paroxysmal atrial fibrillation: Secondary | ICD-10-CM | POA: Diagnosis not present

## 2022-03-31 DIAGNOSIS — N400 Enlarged prostate without lower urinary tract symptoms: Secondary | ICD-10-CM | POA: Diagnosis not present

## 2022-03-31 DIAGNOSIS — E7800 Pure hypercholesterolemia, unspecified: Secondary | ICD-10-CM | POA: Diagnosis not present

## 2022-03-31 DIAGNOSIS — Z7901 Long term (current) use of anticoagulants: Secondary | ICD-10-CM | POA: Diagnosis not present

## 2022-03-31 DIAGNOSIS — E7849 Other hyperlipidemia: Secondary | ICD-10-CM | POA: Diagnosis not present

## 2022-03-31 DIAGNOSIS — Z86018 Personal history of other benign neoplasm: Secondary | ICD-10-CM | POA: Diagnosis not present

## 2022-03-31 DIAGNOSIS — D8685 Sarcoid myocarditis: Secondary | ICD-10-CM | POA: Diagnosis not present

## 2022-03-31 DIAGNOSIS — Z86010 Personal history of colonic polyps: Secondary | ICD-10-CM | POA: Diagnosis not present

## 2022-03-31 DIAGNOSIS — N184 Chronic kidney disease, stage 4 (severe): Secondary | ICD-10-CM | POA: Diagnosis not present

## 2022-03-31 DIAGNOSIS — Z8673 Personal history of transient ischemic attack (TIA), and cerebral infarction without residual deficits: Secondary | ICD-10-CM | POA: Diagnosis not present

## 2022-03-31 DIAGNOSIS — I429 Cardiomyopathy, unspecified: Secondary | ICD-10-CM | POA: Diagnosis not present

## 2022-03-31 DIAGNOSIS — I130 Hypertensive heart and chronic kidney disease with heart failure and stage 1 through stage 4 chronic kidney disease, or unspecified chronic kidney disease: Secondary | ICD-10-CM | POA: Diagnosis not present

## 2022-03-31 DIAGNOSIS — D631 Anemia in chronic kidney disease: Secondary | ICD-10-CM | POA: Diagnosis not present

## 2022-03-31 DIAGNOSIS — R413 Other amnesia: Secondary | ICD-10-CM | POA: Diagnosis not present

## 2022-03-31 DIAGNOSIS — H9193 Unspecified hearing loss, bilateral: Secondary | ICD-10-CM | POA: Diagnosis not present

## 2022-03-31 DIAGNOSIS — E1122 Type 2 diabetes mellitus with diabetic chronic kidney disease: Secondary | ICD-10-CM | POA: Diagnosis not present

## 2022-03-31 DIAGNOSIS — E109 Type 1 diabetes mellitus without complications: Secondary | ICD-10-CM | POA: Diagnosis not present

## 2022-03-31 DIAGNOSIS — Z87891 Personal history of nicotine dependence: Secondary | ICD-10-CM | POA: Diagnosis not present

## 2022-03-31 DIAGNOSIS — I4892 Unspecified atrial flutter: Secondary | ICD-10-CM | POA: Diagnosis not present

## 2022-04-05 DIAGNOSIS — Z7901 Long term (current) use of anticoagulants: Secondary | ICD-10-CM | POA: Diagnosis not present

## 2022-04-05 DIAGNOSIS — R413 Other amnesia: Secondary | ICD-10-CM | POA: Diagnosis not present

## 2022-04-05 DIAGNOSIS — E109 Type 1 diabetes mellitus without complications: Secondary | ICD-10-CM | POA: Diagnosis not present

## 2022-04-05 DIAGNOSIS — H9193 Unspecified hearing loss, bilateral: Secondary | ICD-10-CM | POA: Diagnosis not present

## 2022-04-05 DIAGNOSIS — I429 Cardiomyopathy, unspecified: Secondary | ICD-10-CM | POA: Diagnosis not present

## 2022-04-05 DIAGNOSIS — I4892 Unspecified atrial flutter: Secondary | ICD-10-CM | POA: Diagnosis not present

## 2022-04-05 DIAGNOSIS — Z9181 History of falling: Secondary | ICD-10-CM | POA: Diagnosis not present

## 2022-04-05 DIAGNOSIS — E7800 Pure hypercholesterolemia, unspecified: Secondary | ICD-10-CM | POA: Diagnosis not present

## 2022-04-05 DIAGNOSIS — D8685 Sarcoid myocarditis: Secondary | ICD-10-CM | POA: Diagnosis not present

## 2022-04-05 DIAGNOSIS — Z87891 Personal history of nicotine dependence: Secondary | ICD-10-CM | POA: Diagnosis not present

## 2022-04-06 ENCOUNTER — Telehealth: Payer: Self-pay | Admitting: *Deleted

## 2022-04-06 DIAGNOSIS — Z9181 History of falling: Secondary | ICD-10-CM | POA: Diagnosis not present

## 2022-04-06 DIAGNOSIS — E109 Type 1 diabetes mellitus without complications: Secondary | ICD-10-CM | POA: Diagnosis not present

## 2022-04-06 DIAGNOSIS — H9193 Unspecified hearing loss, bilateral: Secondary | ICD-10-CM | POA: Diagnosis not present

## 2022-04-06 DIAGNOSIS — Z7901 Long term (current) use of anticoagulants: Secondary | ICD-10-CM | POA: Diagnosis not present

## 2022-04-06 DIAGNOSIS — Z87891 Personal history of nicotine dependence: Secondary | ICD-10-CM | POA: Diagnosis not present

## 2022-04-06 DIAGNOSIS — I429 Cardiomyopathy, unspecified: Secondary | ICD-10-CM | POA: Diagnosis not present

## 2022-04-06 DIAGNOSIS — I4892 Unspecified atrial flutter: Secondary | ICD-10-CM | POA: Diagnosis not present

## 2022-04-06 DIAGNOSIS — D8685 Sarcoid myocarditis: Secondary | ICD-10-CM | POA: Diagnosis not present

## 2022-04-06 DIAGNOSIS — E7800 Pure hypercholesterolemia, unspecified: Secondary | ICD-10-CM | POA: Diagnosis not present

## 2022-04-06 DIAGNOSIS — R413 Other amnesia: Secondary | ICD-10-CM | POA: Diagnosis not present

## 2022-04-06 NOTE — Patient Outreach (Signed)
  Care Coordination   04/06/2022 Name: Cody Howard MRN: [id number] DOB: [birth year]   Care Coordination Outreach Attempts:  An unsuccessful telephone outreach was attempted today to offer the patient information about available care coordination services as a benefit of their health plan.   Follow Up Plan:  Additional outreach attempts will be made to offer the patient care coordination information and services.   Encounter Outcome:  No Answer   Care Coordination Interventions:  No, not indicated    Valente David, RN, MSN, [facility] Care Management Care Management Coordinator [phone]

## 2022-04-07 ENCOUNTER — Telehealth: Payer: Self-pay | Admitting: *Deleted

## 2022-04-07 ENCOUNTER — Encounter: Payer: Self-pay | Admitting: *Deleted

## 2022-04-07 NOTE — Patient Outreach (Signed)
  Care Coordination   Initial Visit Note   04/07/2022 Name: Cody Howard MRN: [id number] DOB: [birth year]  Cody Howard is a 88 y.o. year old male who sees [name], MD for primary care. I spoke with [name], daughter of Cody Howard by phone today.  What matters to the patients health and wellness today?  Per [name], patient had fall recently, no loss of consciousness, not broken bones.  She is interested in resources for caring for patients with dementia.     Goals Addressed             This Visit's Progress    No recurrent falls       Care Coordination Interventions: Provided written and verbal education re: potential causes of falls and Fall prevention strategies Advised patient of importance of notifying provider of falls Assessed for falls since last encounter Advised patient to discuss Caregiver for patients with dementia with provider Screening for signs and symptoms of depression related to chronic disease state  Assessed social determinant of health barriers Collaborate with CSW and care guide to provide resources for caring for patients with dementia Discussed participation with home health PT/OT for balance and strength Reminded to call PCP to schedule follow up visit.  Last office visit was 7/27, AWV was 02/03/22.          SDOH assessments and interventions completed:  Yes  SDOH Interventions Today    Flowsheet Row Most Recent Value  SDOH Interventions   Food Insecurity Interventions Intervention Not Indicated  Housing Interventions Intervention Not Indicated  Transportation Interventions Intervention Not Indicated        Care Coordination Interventions:  Yes, provided   Follow up plan: Follow up call scheduled for 4/11    Encounter Outcome:  Pt. Visit Completed   Valente David, RN, MSN, [place] Care Management Care Management Coordinator [phone]

## 2022-04-07 NOTE — Patient Instructions (Signed)
Visit Information  Thank you for taking time to visit with me today. Please don't hesitate to contact me if I can be of assistance to you before our next scheduled telephone appointment.  Following are the goals we discussed today:  Continue involvement with PT/OT for strength building.  Our next appointment is by telephone on 4/11  Please call the care guide team at [phone] if you need to cancel or reschedule your appointment.   Please call the Suicide and Crisis Lifeline: 988 call the [place] National Suicide Prevention Lifeline: (445) 474-8368 or TTY: 229-262-1914 TTY (303)694-5811) to talk to a trained counselor call 1-800-273-TALK (toll free, 24 hour hotline) call 911 if you are experiencing a Mental Health or [place] or need someone to talk to.  Patient verbalizes understanding of instructions and care plan provided today and agrees to view in [place] [facility]. Active MyChart status and patient understanding of how to access instructions and care plan via MyChart confirmed with patient.     The patient has been provided with contact information for the care management team and has been advised to call with any health related questions or concerns.   Valente David, RN, MSN, [place] Care Management Care Management Coordinator [phone]

## 2022-04-08 ENCOUNTER — Other Ambulatory Visit: Payer: Self-pay | Admitting: Family Medicine

## 2022-04-08 DIAGNOSIS — J301 Allergic rhinitis due to pollen: Secondary | ICD-10-CM

## 2022-04-08 DIAGNOSIS — I4892 Unspecified atrial flutter: Secondary | ICD-10-CM | POA: Diagnosis not present

## 2022-04-08 DIAGNOSIS — E7800 Pure hypercholesterolemia, unspecified: Secondary | ICD-10-CM | POA: Diagnosis not present

## 2022-04-08 DIAGNOSIS — R413 Other amnesia: Secondary | ICD-10-CM | POA: Diagnosis not present

## 2022-04-08 DIAGNOSIS — Z9181 History of falling: Secondary | ICD-10-CM | POA: Diagnosis not present

## 2022-04-08 DIAGNOSIS — E109 Type 1 diabetes mellitus without complications: Secondary | ICD-10-CM | POA: Diagnosis not present

## 2022-04-08 DIAGNOSIS — Z87891 Personal history of nicotine dependence: Secondary | ICD-10-CM | POA: Diagnosis not present

## 2022-04-08 DIAGNOSIS — Z7901 Long term (current) use of anticoagulants: Secondary | ICD-10-CM | POA: Diagnosis not present

## 2022-04-08 DIAGNOSIS — H9193 Unspecified hearing loss, bilateral: Secondary | ICD-10-CM | POA: Diagnosis not present

## 2022-04-08 DIAGNOSIS — J310 Chronic rhinitis: Secondary | ICD-10-CM

## 2022-04-08 DIAGNOSIS — D8685 Sarcoid myocarditis: Secondary | ICD-10-CM | POA: Diagnosis not present

## 2022-04-08 DIAGNOSIS — I429 Cardiomyopathy, unspecified: Secondary | ICD-10-CM | POA: Diagnosis not present

## 2022-04-08 NOTE — Telephone Encounter (Signed)
Requested Prescriptions  Pending Prescriptions Disp Refills   losartan (COZAAR) 100 MG tablet [Pharmacy Med Name: LOSARTAN POTASSIUM 100 MG TAB] 45 tablet 0    Sig: TAKE 1/2 TABLET BY MOUTH DAILY.     Cardiovascular:  Angiotensin Receptor Blockers Failed - 04/08/2022 10:28 AM      Failed - Cr in normal range and within 180 days    Creatinine, Ser  Date Value Ref Range Status  06/01/2021 2.30 (H) 0.76 - 1.27 mg/dL Final         Failed - K in normal range and within 180 days    Potassium  Date Value Ref Range Status  06/01/2021 5.1 3.5 - 5.2 mmol/L Final         Failed - Last BP in normal range    BP Readings from Last 1 Encounters:  01/01/22 (!) 159/73         Failed - Valid encounter within last 6 months    Recent Outpatient Visits           6 months ago Cardiomyopathy, unspecified type ([place])   [facility] [place], Deanna C, MD   10 months ago Essential hypertension   [facility] [place], Deanna C, MD   1 year ago Type 2 diabetes mellitus without complication, without long-term current use of insulin ([place])   [facility] [place], Deanna C, MD   1 year ago Acute rhinosinusitis   [place] [facility] [place], Earley Abide, MD   1 year ago Chronic rhinitis   [facility], MD              Passed - Patient is not pregnant       montelukast (SINGULAIR) 10 MG tablet [Pharmacy Med Name: MONTELUKAST SODIUM 10 MG TAB] 90 tablet 0    Sig: TAKE 1 TABLET BY MOUTH EVERY DAY AT BEDTIME.     Pulmonology:  Leukotriene Inhibitors Passed - 04/08/2022 10:28 AM      Passed - Valid encounter within last 12 months    Recent Outpatient Visits           6 months ago Cardiomyopathy, unspecified type [facility])   [facility] [place], Deanna C, MD   10 months ago Essential hypertension   [facility], MD   1 year ago Type 2 diabetes mellitus without complication, without long-term current use of insulin ([place])   [facility] [place], Deanna C, MD   1 year ago Acute rhinosinusitis   [place] [facility] [place], Earley Abide, MD   1 year ago Chronic rhinitis   [place] [facility], MD               furosemide (LASIX) 20 MG tablet [Pharmacy Med Name: FUROSEMIDE 20 MG TAB] 90 tablet 0    Sig: TAKE (1) TABLET BY MOUTH EVERY DAY     Cardiovascular:  Diuretics - Loop Failed - 04/08/2022 10:28 AM      Failed - K in normal range and within 180 days  Potassium  Date Value Ref Range Status  06/01/2021 5.1 3.5 - 5.2 mmol/L Final         Failed - Ca in normal range and within 180 days    Calcium  Date Value Ref Range Status  06/01/2021 8.5 (L) 8.6 - 10.2 mg/dL Final         Failed - Na in normal range and within 180 days    Sodium  Date Value Ref Range Status  06/01/2021 141 134 - 144 mmol/L Final         Failed - Cr in normal range and within 180 days    Creatinine, Ser  Date Value Ref Range Status  06/01/2021 2.30 (H) 0.76 - 1.27 mg/dL Final         Failed - Cl in normal range and within 180 days    Chloride  Date Value Ref Range Status  06/01/2021 109 (H) 96 - 106 mmol/L Final         Failed - Mg Level in normal range and within 180 days    No results found for: "MG"       Failed - Last BP in normal range    BP Readings from Last 1 Encounters:  01/01/22 (!) 159/73         Failed - Valid encounter within last 6 months    Recent Outpatient Visits           6 months ago Cardiomyopathy, unspecified type ([place])   [facility]  [place], Deanna C, MD   10 months ago Essential hypertension   [facility] [place], Deanna C, MD   1 year ago Type 2 diabetes mellitus without complication, without long-term current use of insulin ([place])   [facility] [place], Deanna C, MD   1 year ago Acute rhinosinusitis   [place] [facility] [place], Earley Abide, MD   1 year ago Chronic rhinitis   [place] [facility], MD

## 2022-04-09 DIAGNOSIS — C44519 Basal cell carcinoma of skin of other part of trunk: Secondary | ICD-10-CM | POA: Diagnosis not present

## 2022-04-09 DIAGNOSIS — R413 Other amnesia: Secondary | ICD-10-CM | POA: Diagnosis not present

## 2022-04-09 DIAGNOSIS — H9193 Unspecified hearing loss, bilateral: Secondary | ICD-10-CM | POA: Diagnosis not present

## 2022-04-09 DIAGNOSIS — Z7901 Long term (current) use of anticoagulants: Secondary | ICD-10-CM | POA: Diagnosis not present

## 2022-04-09 DIAGNOSIS — Z7189 Other specified counseling: Secondary | ICD-10-CM | POA: Diagnosis not present

## 2022-04-09 DIAGNOSIS — C44329 Squamous cell carcinoma of skin of other parts of face: Secondary | ICD-10-CM | POA: Diagnosis not present

## 2022-04-09 DIAGNOSIS — L814 Other melanin hyperpigmentation: Secondary | ICD-10-CM | POA: Diagnosis not present

## 2022-04-09 DIAGNOSIS — D1801 Hemangioma of skin and subcutaneous tissue: Secondary | ICD-10-CM | POA: Diagnosis not present

## 2022-04-09 DIAGNOSIS — Z9181 History of falling: Secondary | ICD-10-CM | POA: Diagnosis not present

## 2022-04-09 DIAGNOSIS — I429 Cardiomyopathy, unspecified: Secondary | ICD-10-CM | POA: Diagnosis not present

## 2022-04-09 DIAGNOSIS — I4892 Unspecified atrial flutter: Secondary | ICD-10-CM | POA: Diagnosis not present

## 2022-04-09 DIAGNOSIS — L821 Other seborrheic keratosis: Secondary | ICD-10-CM | POA: Diagnosis not present

## 2022-04-09 DIAGNOSIS — L570 Actinic keratosis: Secondary | ICD-10-CM | POA: Diagnosis not present

## 2022-04-09 DIAGNOSIS — Z87891 Personal history of nicotine dependence: Secondary | ICD-10-CM | POA: Diagnosis not present

## 2022-04-09 DIAGNOSIS — E109 Type 1 diabetes mellitus without complications: Secondary | ICD-10-CM | POA: Diagnosis not present

## 2022-04-09 DIAGNOSIS — E7800 Pure hypercholesterolemia, unspecified: Secondary | ICD-10-CM | POA: Diagnosis not present

## 2022-04-09 DIAGNOSIS — D8685 Sarcoid myocarditis: Secondary | ICD-10-CM | POA: Diagnosis not present

## 2022-04-09 DIAGNOSIS — X32XXXA Exposure to sunlight, initial encounter: Secondary | ICD-10-CM | POA: Diagnosis not present

## 2022-04-09 DIAGNOSIS — D485 Neoplasm of uncertain behavior of skin: Secondary | ICD-10-CM | POA: Diagnosis not present

## 2022-04-09 DIAGNOSIS — Z08 Encounter for follow-up examination after completed treatment for malignant neoplasm: Secondary | ICD-10-CM | POA: Diagnosis not present

## 2022-04-09 DIAGNOSIS — Z85828 Personal history of other malignant neoplasm of skin: Secondary | ICD-10-CM | POA: Diagnosis not present

## 2022-04-09 DIAGNOSIS — D692 Other nonthrombocytopenic purpura: Secondary | ICD-10-CM | POA: Diagnosis not present

## 2022-04-21 DIAGNOSIS — Z87891 Personal history of nicotine dependence: Secondary | ICD-10-CM | POA: Diagnosis not present

## 2022-04-21 DIAGNOSIS — Z7901 Long term (current) use of anticoagulants: Secondary | ICD-10-CM | POA: Diagnosis not present

## 2022-04-21 DIAGNOSIS — D8685 Sarcoid myocarditis: Secondary | ICD-10-CM | POA: Diagnosis not present

## 2022-04-21 DIAGNOSIS — R413 Other amnesia: Secondary | ICD-10-CM | POA: Diagnosis not present

## 2022-04-21 DIAGNOSIS — E109 Type 1 diabetes mellitus without complications: Secondary | ICD-10-CM | POA: Diagnosis not present

## 2022-04-21 DIAGNOSIS — I429 Cardiomyopathy, unspecified: Secondary | ICD-10-CM | POA: Diagnosis not present

## 2022-04-21 DIAGNOSIS — Z9181 History of falling: Secondary | ICD-10-CM | POA: Diagnosis not present

## 2022-04-21 DIAGNOSIS — H9193 Unspecified hearing loss, bilateral: Secondary | ICD-10-CM | POA: Diagnosis not present

## 2022-04-21 DIAGNOSIS — E7800 Pure hypercholesterolemia, unspecified: Secondary | ICD-10-CM | POA: Diagnosis not present

## 2022-04-21 DIAGNOSIS — I4892 Unspecified atrial flutter: Secondary | ICD-10-CM | POA: Diagnosis not present

## 2022-04-22 DIAGNOSIS — Z9181 History of falling: Secondary | ICD-10-CM | POA: Diagnosis not present

## 2022-04-22 DIAGNOSIS — Z87891 Personal history of nicotine dependence: Secondary | ICD-10-CM | POA: Diagnosis not present

## 2022-04-22 DIAGNOSIS — E7800 Pure hypercholesterolemia, unspecified: Secondary | ICD-10-CM | POA: Diagnosis not present

## 2022-04-22 DIAGNOSIS — D8685 Sarcoid myocarditis: Secondary | ICD-10-CM | POA: Diagnosis not present

## 2022-04-22 DIAGNOSIS — E109 Type 1 diabetes mellitus without complications: Secondary | ICD-10-CM | POA: Diagnosis not present

## 2022-04-22 DIAGNOSIS — R413 Other amnesia: Secondary | ICD-10-CM | POA: Diagnosis not present

## 2022-04-22 DIAGNOSIS — I4892 Unspecified atrial flutter: Secondary | ICD-10-CM | POA: Diagnosis not present

## 2022-04-22 DIAGNOSIS — H9193 Unspecified hearing loss, bilateral: Secondary | ICD-10-CM | POA: Diagnosis not present

## 2022-04-22 DIAGNOSIS — I429 Cardiomyopathy, unspecified: Secondary | ICD-10-CM | POA: Diagnosis not present

## 2022-04-22 DIAGNOSIS — Z7901 Long term (current) use of anticoagulants: Secondary | ICD-10-CM | POA: Diagnosis not present

## 2022-04-23 DIAGNOSIS — Z87891 Personal history of nicotine dependence: Secondary | ICD-10-CM | POA: Diagnosis not present

## 2022-04-23 DIAGNOSIS — H9193 Unspecified hearing loss, bilateral: Secondary | ICD-10-CM | POA: Diagnosis not present

## 2022-04-23 DIAGNOSIS — E7800 Pure hypercholesterolemia, unspecified: Secondary | ICD-10-CM | POA: Diagnosis not present

## 2022-04-23 DIAGNOSIS — D8685 Sarcoid myocarditis: Secondary | ICD-10-CM | POA: Diagnosis not present

## 2022-04-23 DIAGNOSIS — I429 Cardiomyopathy, unspecified: Secondary | ICD-10-CM | POA: Diagnosis not present

## 2022-04-23 DIAGNOSIS — Z9181 History of falling: Secondary | ICD-10-CM | POA: Diagnosis not present

## 2022-04-23 DIAGNOSIS — E109 Type 1 diabetes mellitus without complications: Secondary | ICD-10-CM | POA: Diagnosis not present

## 2022-04-23 DIAGNOSIS — I4892 Unspecified atrial flutter: Secondary | ICD-10-CM | POA: Diagnosis not present

## 2022-04-23 DIAGNOSIS — Z7901 Long term (current) use of anticoagulants: Secondary | ICD-10-CM | POA: Diagnosis not present

## 2022-04-23 DIAGNOSIS — R413 Other amnesia: Secondary | ICD-10-CM | POA: Diagnosis not present

## 2022-04-27 DIAGNOSIS — Z9181 History of falling: Secondary | ICD-10-CM | POA: Diagnosis not present

## 2022-04-27 DIAGNOSIS — D8685 Sarcoid myocarditis: Secondary | ICD-10-CM | POA: Diagnosis not present

## 2022-04-27 DIAGNOSIS — E109 Type 1 diabetes mellitus without complications: Secondary | ICD-10-CM | POA: Diagnosis not present

## 2022-04-27 DIAGNOSIS — H9193 Unspecified hearing loss, bilateral: Secondary | ICD-10-CM | POA: Diagnosis not present

## 2022-04-27 DIAGNOSIS — Z7901 Long term (current) use of anticoagulants: Secondary | ICD-10-CM | POA: Diagnosis not present

## 2022-04-27 DIAGNOSIS — R413 Other amnesia: Secondary | ICD-10-CM | POA: Diagnosis not present

## 2022-04-27 DIAGNOSIS — E7800 Pure hypercholesterolemia, unspecified: Secondary | ICD-10-CM | POA: Diagnosis not present

## 2022-04-27 DIAGNOSIS — I429 Cardiomyopathy, unspecified: Secondary | ICD-10-CM | POA: Diagnosis not present

## 2022-04-27 DIAGNOSIS — I4892 Unspecified atrial flutter: Secondary | ICD-10-CM | POA: Diagnosis not present

## 2022-04-27 DIAGNOSIS — Z87891 Personal history of nicotine dependence: Secondary | ICD-10-CM | POA: Diagnosis not present

## 2022-04-28 DIAGNOSIS — H9193 Unspecified hearing loss, bilateral: Secondary | ICD-10-CM | POA: Diagnosis not present

## 2022-04-28 DIAGNOSIS — E7800 Pure hypercholesterolemia, unspecified: Secondary | ICD-10-CM | POA: Diagnosis not present

## 2022-04-28 DIAGNOSIS — Z7901 Long term (current) use of anticoagulants: Secondary | ICD-10-CM | POA: Diagnosis not present

## 2022-04-28 DIAGNOSIS — R413 Other amnesia: Secondary | ICD-10-CM | POA: Diagnosis not present

## 2022-04-28 DIAGNOSIS — I429 Cardiomyopathy, unspecified: Secondary | ICD-10-CM | POA: Diagnosis not present

## 2022-04-28 DIAGNOSIS — I4892 Unspecified atrial flutter: Secondary | ICD-10-CM | POA: Diagnosis not present

## 2022-04-28 DIAGNOSIS — D8685 Sarcoid myocarditis: Secondary | ICD-10-CM | POA: Diagnosis not present

## 2022-04-28 DIAGNOSIS — Z9181 History of falling: Secondary | ICD-10-CM | POA: Diagnosis not present

## 2022-04-28 DIAGNOSIS — E109 Type 1 diabetes mellitus without complications: Secondary | ICD-10-CM | POA: Diagnosis not present

## 2022-04-28 DIAGNOSIS — Z87891 Personal history of nicotine dependence: Secondary | ICD-10-CM | POA: Diagnosis not present

## 2022-04-30 DIAGNOSIS — E7800 Pure hypercholesterolemia, unspecified: Secondary | ICD-10-CM | POA: Diagnosis not present

## 2022-04-30 DIAGNOSIS — Z9181 History of falling: Secondary | ICD-10-CM | POA: Diagnosis not present

## 2022-04-30 DIAGNOSIS — Z7901 Long term (current) use of anticoagulants: Secondary | ICD-10-CM | POA: Diagnosis not present

## 2022-04-30 DIAGNOSIS — D8685 Sarcoid myocarditis: Secondary | ICD-10-CM | POA: Diagnosis not present

## 2022-04-30 DIAGNOSIS — H9193 Unspecified hearing loss, bilateral: Secondary | ICD-10-CM | POA: Diagnosis not present

## 2022-04-30 DIAGNOSIS — R413 Other amnesia: Secondary | ICD-10-CM | POA: Diagnosis not present

## 2022-04-30 DIAGNOSIS — Z87891 Personal history of nicotine dependence: Secondary | ICD-10-CM | POA: Diagnosis not present

## 2022-04-30 DIAGNOSIS — I4892 Unspecified atrial flutter: Secondary | ICD-10-CM | POA: Diagnosis not present

## 2022-04-30 DIAGNOSIS — E109 Type 1 diabetes mellitus without complications: Secondary | ICD-10-CM | POA: Diagnosis not present

## 2022-04-30 DIAGNOSIS — I429 Cardiomyopathy, unspecified: Secondary | ICD-10-CM | POA: Diagnosis not present

## 2022-05-03 DIAGNOSIS — I429 Cardiomyopathy, unspecified: Secondary | ICD-10-CM | POA: Diagnosis not present

## 2022-05-03 DIAGNOSIS — R413 Other amnesia: Secondary | ICD-10-CM | POA: Diagnosis not present

## 2022-05-03 DIAGNOSIS — I4892 Unspecified atrial flutter: Secondary | ICD-10-CM | POA: Diagnosis not present

## 2022-05-03 DIAGNOSIS — E109 Type 1 diabetes mellitus without complications: Secondary | ICD-10-CM | POA: Diagnosis not present

## 2022-05-03 DIAGNOSIS — H9193 Unspecified hearing loss, bilateral: Secondary | ICD-10-CM | POA: Diagnosis not present

## 2022-05-03 DIAGNOSIS — D8685 Sarcoid myocarditis: Secondary | ICD-10-CM | POA: Diagnosis not present

## 2022-05-03 DIAGNOSIS — E7800 Pure hypercholesterolemia, unspecified: Secondary | ICD-10-CM | POA: Diagnosis not present

## 2022-05-04 DIAGNOSIS — N184 Chronic kidney disease, stage 4 (severe): Secondary | ICD-10-CM | POA: Diagnosis not present

## 2022-05-04 DIAGNOSIS — R55 Syncope and collapse: Secondary | ICD-10-CM | POA: Diagnosis not present

## 2022-05-04 DIAGNOSIS — R809 Proteinuria, unspecified: Secondary | ICD-10-CM | POA: Diagnosis not present

## 2022-05-04 DIAGNOSIS — E1122 Type 2 diabetes mellitus with diabetic chronic kidney disease: Secondary | ICD-10-CM | POA: Diagnosis not present

## 2022-05-04 DIAGNOSIS — D631 Anemia in chronic kidney disease: Secondary | ICD-10-CM | POA: Diagnosis not present

## 2022-05-04 DIAGNOSIS — I10 Essential (primary) hypertension: Secondary | ICD-10-CM | POA: Diagnosis not present

## 2022-05-04 DIAGNOSIS — N2581 Secondary hyperparathyroidism of renal origin: Secondary | ICD-10-CM | POA: Diagnosis not present

## 2022-05-05 DIAGNOSIS — Z9181 History of falling: Secondary | ICD-10-CM | POA: Diagnosis not present

## 2022-05-05 DIAGNOSIS — I429 Cardiomyopathy, unspecified: Secondary | ICD-10-CM | POA: Diagnosis not present

## 2022-05-05 DIAGNOSIS — Z87891 Personal history of nicotine dependence: Secondary | ICD-10-CM | POA: Diagnosis not present

## 2022-05-05 DIAGNOSIS — I4892 Unspecified atrial flutter: Secondary | ICD-10-CM | POA: Diagnosis not present

## 2022-05-05 DIAGNOSIS — D8685 Sarcoid myocarditis: Secondary | ICD-10-CM | POA: Diagnosis not present

## 2022-05-05 DIAGNOSIS — H9193 Unspecified hearing loss, bilateral: Secondary | ICD-10-CM | POA: Diagnosis not present

## 2022-05-05 DIAGNOSIS — R413 Other amnesia: Secondary | ICD-10-CM | POA: Diagnosis not present

## 2022-05-05 DIAGNOSIS — Z7901 Long term (current) use of anticoagulants: Secondary | ICD-10-CM | POA: Diagnosis not present

## 2022-05-05 DIAGNOSIS — E109 Type 1 diabetes mellitus without complications: Secondary | ICD-10-CM | POA: Diagnosis not present

## 2022-05-05 DIAGNOSIS — E7800 Pure hypercholesterolemia, unspecified: Secondary | ICD-10-CM | POA: Diagnosis not present

## 2022-05-06 ENCOUNTER — Other Ambulatory Visit: Payer: Self-pay | Admitting: Family Medicine

## 2022-05-06 DIAGNOSIS — R413 Other amnesia: Secondary | ICD-10-CM | POA: Diagnosis not present

## 2022-05-06 DIAGNOSIS — I4892 Unspecified atrial flutter: Secondary | ICD-10-CM | POA: Diagnosis not present

## 2022-05-06 DIAGNOSIS — Z9181 History of falling: Secondary | ICD-10-CM | POA: Diagnosis not present

## 2022-05-06 DIAGNOSIS — H9193 Unspecified hearing loss, bilateral: Secondary | ICD-10-CM | POA: Diagnosis not present

## 2022-05-06 DIAGNOSIS — D8685 Sarcoid myocarditis: Secondary | ICD-10-CM | POA: Diagnosis not present

## 2022-05-06 DIAGNOSIS — Z7901 Long term (current) use of anticoagulants: Secondary | ICD-10-CM | POA: Diagnosis not present

## 2022-05-06 DIAGNOSIS — Z87891 Personal history of nicotine dependence: Secondary | ICD-10-CM | POA: Diagnosis not present

## 2022-05-06 DIAGNOSIS — F329 Major depressive disorder, single episode, unspecified: Secondary | ICD-10-CM

## 2022-05-06 DIAGNOSIS — E109 Type 1 diabetes mellitus without complications: Secondary | ICD-10-CM | POA: Diagnosis not present

## 2022-05-06 DIAGNOSIS — E7800 Pure hypercholesterolemia, unspecified: Secondary | ICD-10-CM | POA: Diagnosis not present

## 2022-05-06 DIAGNOSIS — I429 Cardiomyopathy, unspecified: Secondary | ICD-10-CM | POA: Diagnosis not present

## 2022-05-07 NOTE — Telephone Encounter (Signed)
Courtesy refill  

## 2022-05-10 DIAGNOSIS — I429 Cardiomyopathy, unspecified: Secondary | ICD-10-CM | POA: Diagnosis not present

## 2022-05-10 DIAGNOSIS — Z7901 Long term (current) use of anticoagulants: Secondary | ICD-10-CM | POA: Diagnosis not present

## 2022-05-10 DIAGNOSIS — Z87891 Personal history of nicotine dependence: Secondary | ICD-10-CM | POA: Diagnosis not present

## 2022-05-10 DIAGNOSIS — R413 Other amnesia: Secondary | ICD-10-CM | POA: Diagnosis not present

## 2022-05-10 DIAGNOSIS — E7800 Pure hypercholesterolemia, unspecified: Secondary | ICD-10-CM | POA: Diagnosis not present

## 2022-05-10 DIAGNOSIS — I4892 Unspecified atrial flutter: Secondary | ICD-10-CM | POA: Diagnosis not present

## 2022-05-10 DIAGNOSIS — Z9181 History of falling: Secondary | ICD-10-CM | POA: Diagnosis not present

## 2022-05-10 DIAGNOSIS — E109 Type 1 diabetes mellitus without complications: Secondary | ICD-10-CM | POA: Diagnosis not present

## 2022-05-10 DIAGNOSIS — H9193 Unspecified hearing loss, bilateral: Secondary | ICD-10-CM | POA: Diagnosis not present

## 2022-05-10 DIAGNOSIS — D8685 Sarcoid myocarditis: Secondary | ICD-10-CM | POA: Diagnosis not present

## 2022-05-13 DIAGNOSIS — E7800 Pure hypercholesterolemia, unspecified: Secondary | ICD-10-CM | POA: Diagnosis not present

## 2022-05-13 DIAGNOSIS — H9193 Unspecified hearing loss, bilateral: Secondary | ICD-10-CM | POA: Diagnosis not present

## 2022-05-13 DIAGNOSIS — D8685 Sarcoid myocarditis: Secondary | ICD-10-CM | POA: Diagnosis not present

## 2022-05-13 DIAGNOSIS — Z7901 Long term (current) use of anticoagulants: Secondary | ICD-10-CM | POA: Diagnosis not present

## 2022-05-13 DIAGNOSIS — I4892 Unspecified atrial flutter: Secondary | ICD-10-CM | POA: Diagnosis not present

## 2022-05-13 DIAGNOSIS — E109 Type 1 diabetes mellitus without complications: Secondary | ICD-10-CM | POA: Diagnosis not present

## 2022-05-13 DIAGNOSIS — R413 Other amnesia: Secondary | ICD-10-CM | POA: Diagnosis not present

## 2022-05-13 DIAGNOSIS — I429 Cardiomyopathy, unspecified: Secondary | ICD-10-CM | POA: Diagnosis not present

## 2022-05-13 DIAGNOSIS — Z87891 Personal history of nicotine dependence: Secondary | ICD-10-CM | POA: Diagnosis not present

## 2022-05-13 DIAGNOSIS — Z9181 History of falling: Secondary | ICD-10-CM | POA: Diagnosis not present

## 2022-05-17 DIAGNOSIS — I429 Cardiomyopathy, unspecified: Secondary | ICD-10-CM | POA: Diagnosis not present

## 2022-05-17 DIAGNOSIS — H9193 Unspecified hearing loss, bilateral: Secondary | ICD-10-CM | POA: Diagnosis not present

## 2022-05-17 DIAGNOSIS — D8685 Sarcoid myocarditis: Secondary | ICD-10-CM | POA: Diagnosis not present

## 2022-05-17 DIAGNOSIS — E7800 Pure hypercholesterolemia, unspecified: Secondary | ICD-10-CM | POA: Diagnosis not present

## 2022-05-17 DIAGNOSIS — Z87891 Personal history of nicotine dependence: Secondary | ICD-10-CM | POA: Diagnosis not present

## 2022-05-17 DIAGNOSIS — R413 Other amnesia: Secondary | ICD-10-CM | POA: Diagnosis not present

## 2022-05-17 DIAGNOSIS — E109 Type 1 diabetes mellitus without complications: Secondary | ICD-10-CM | POA: Diagnosis not present

## 2022-05-17 DIAGNOSIS — Z9181 History of falling: Secondary | ICD-10-CM | POA: Diagnosis not present

## 2022-05-17 DIAGNOSIS — I4892 Unspecified atrial flutter: Secondary | ICD-10-CM | POA: Diagnosis not present

## 2022-05-17 DIAGNOSIS — Z7901 Long term (current) use of anticoagulants: Secondary | ICD-10-CM | POA: Diagnosis not present

## 2022-05-18 DIAGNOSIS — I420 Dilated cardiomyopathy: Secondary | ICD-10-CM | POA: Diagnosis not present

## 2022-05-18 DIAGNOSIS — I5022 Chronic systolic (congestive) heart failure: Secondary | ICD-10-CM | POA: Diagnosis not present

## 2022-05-18 DIAGNOSIS — I340 Nonrheumatic mitral (valve) insufficiency: Secondary | ICD-10-CM | POA: Diagnosis not present

## 2022-05-18 DIAGNOSIS — E782 Mixed hyperlipidemia: Secondary | ICD-10-CM | POA: Diagnosis not present

## 2022-05-18 DIAGNOSIS — I4892 Unspecified atrial flutter: Secondary | ICD-10-CM | POA: Diagnosis not present

## 2022-05-18 DIAGNOSIS — N1832 Chronic kidney disease, stage 3b: Secondary | ICD-10-CM | POA: Diagnosis not present

## 2022-05-21 DIAGNOSIS — H9193 Unspecified hearing loss, bilateral: Secondary | ICD-10-CM | POA: Diagnosis not present

## 2022-05-21 DIAGNOSIS — D8685 Sarcoid myocarditis: Secondary | ICD-10-CM | POA: Diagnosis not present

## 2022-05-21 DIAGNOSIS — I429 Cardiomyopathy, unspecified: Secondary | ICD-10-CM | POA: Diagnosis not present

## 2022-05-21 DIAGNOSIS — Z7901 Long term (current) use of anticoagulants: Secondary | ICD-10-CM | POA: Diagnosis not present

## 2022-05-21 DIAGNOSIS — R413 Other amnesia: Secondary | ICD-10-CM | POA: Diagnosis not present

## 2022-05-21 DIAGNOSIS — Z87891 Personal history of nicotine dependence: Secondary | ICD-10-CM | POA: Diagnosis not present

## 2022-05-21 DIAGNOSIS — E109 Type 1 diabetes mellitus without complications: Secondary | ICD-10-CM | POA: Diagnosis not present

## 2022-05-21 DIAGNOSIS — I4892 Unspecified atrial flutter: Secondary | ICD-10-CM | POA: Diagnosis not present

## 2022-05-21 DIAGNOSIS — E7800 Pure hypercholesterolemia, unspecified: Secondary | ICD-10-CM | POA: Diagnosis not present

## 2022-05-21 DIAGNOSIS — Z9181 History of falling: Secondary | ICD-10-CM | POA: Diagnosis not present

## 2022-05-22 DIAGNOSIS — S81011A Laceration without foreign body, right knee, initial encounter: Secondary | ICD-10-CM | POA: Diagnosis not present

## 2022-05-22 DIAGNOSIS — Z79899 Other long term (current) drug therapy: Secondary | ICD-10-CM | POA: Diagnosis not present

## 2022-05-22 DIAGNOSIS — Z7901 Long term (current) use of anticoagulants: Secondary | ICD-10-CM | POA: Diagnosis not present

## 2022-05-22 DIAGNOSIS — I130 Hypertensive heart and chronic kidney disease with heart failure and stage 1 through stage 4 chronic kidney disease, or unspecified chronic kidney disease: Secondary | ICD-10-CM | POA: Diagnosis not present

## 2022-05-22 DIAGNOSIS — E875 Hyperkalemia: Secondary | ICD-10-CM | POA: Diagnosis not present

## 2022-05-22 DIAGNOSIS — S0101XA Laceration without foreign body of scalp, initial encounter: Secondary | ICD-10-CM | POA: Diagnosis not present

## 2022-05-22 DIAGNOSIS — I5022 Chronic systolic (congestive) heart failure: Secondary | ICD-10-CM | POA: Diagnosis not present

## 2022-05-22 DIAGNOSIS — S51011A Laceration without foreign body of right elbow, initial encounter: Secondary | ICD-10-CM | POA: Diagnosis not present

## 2022-05-22 DIAGNOSIS — N184 Chronic kidney disease, stage 4 (severe): Secondary | ICD-10-CM | POA: Diagnosis not present

## 2022-05-22 DIAGNOSIS — R55 Syncope and collapse: Secondary | ICD-10-CM | POA: Diagnosis not present

## 2022-05-22 DIAGNOSIS — I429 Cardiomyopathy, unspecified: Secondary | ICD-10-CM | POA: Diagnosis not present

## 2022-05-22 DIAGNOSIS — S199XXA Unspecified injury of neck, initial encounter: Secondary | ICD-10-CM | POA: Diagnosis not present

## 2022-05-22 DIAGNOSIS — D631 Anemia in chronic kidney disease: Secondary | ICD-10-CM | POA: Diagnosis not present

## 2022-05-22 DIAGNOSIS — I5020 Unspecified systolic (congestive) heart failure: Secondary | ICD-10-CM | POA: Diagnosis not present

## 2022-05-22 DIAGNOSIS — Z7984 Long term (current) use of oral hypoglycemic drugs: Secondary | ICD-10-CM | POA: Diagnosis not present

## 2022-05-22 DIAGNOSIS — R001 Bradycardia, unspecified: Secondary | ICD-10-CM | POA: Diagnosis not present

## 2022-05-22 DIAGNOSIS — I10 Essential (primary) hypertension: Secondary | ICD-10-CM | POA: Diagnosis not present

## 2022-05-22 DIAGNOSIS — Z9181 History of falling: Secondary | ICD-10-CM | POA: Diagnosis not present

## 2022-05-22 DIAGNOSIS — Z7409 Other reduced mobility: Secondary | ICD-10-CM | POA: Diagnosis not present

## 2022-05-22 DIAGNOSIS — R269 Unspecified abnormalities of gait and mobility: Secondary | ICD-10-CM | POA: Diagnosis not present

## 2022-05-22 DIAGNOSIS — S0990XA Unspecified injury of head, initial encounter: Secondary | ICD-10-CM | POA: Diagnosis not present

## 2022-05-22 DIAGNOSIS — G20A1 Parkinson's disease without dyskinesia, without mention of fluctuations: Secondary | ICD-10-CM | POA: Diagnosis not present

## 2022-05-22 DIAGNOSIS — E785 Hyperlipidemia, unspecified: Secondary | ICD-10-CM | POA: Diagnosis not present

## 2022-05-22 DIAGNOSIS — W19XXXA Unspecified fall, initial encounter: Secondary | ICD-10-CM | POA: Diagnosis not present

## 2022-05-22 DIAGNOSIS — E1122 Type 2 diabetes mellitus with diabetic chronic kidney disease: Secondary | ICD-10-CM | POA: Diagnosis not present

## 2022-05-23 DIAGNOSIS — E1122 Type 2 diabetes mellitus with diabetic chronic kidney disease: Secondary | ICD-10-CM | POA: Diagnosis not present

## 2022-05-23 DIAGNOSIS — Z794 Long term (current) use of insulin: Secondary | ICD-10-CM | POA: Diagnosis not present

## 2022-05-23 DIAGNOSIS — N184 Chronic kidney disease, stage 4 (severe): Secondary | ICD-10-CM | POA: Diagnosis not present

## 2022-05-23 DIAGNOSIS — R55 Syncope and collapse: Secondary | ICD-10-CM | POA: Diagnosis not present

## 2022-05-23 DIAGNOSIS — I129 Hypertensive chronic kidney disease with stage 1 through stage 4 chronic kidney disease, or unspecified chronic kidney disease: Secondary | ICD-10-CM | POA: Diagnosis not present

## 2022-05-23 DIAGNOSIS — I4892 Unspecified atrial flutter: Secondary | ICD-10-CM | POA: Diagnosis not present

## 2022-05-23 DIAGNOSIS — E785 Hyperlipidemia, unspecified: Secondary | ICD-10-CM | POA: Diagnosis not present

## 2022-05-23 DIAGNOSIS — Z79899 Other long term (current) drug therapy: Secondary | ICD-10-CM | POA: Diagnosis not present

## 2022-05-23 DIAGNOSIS — D631 Anemia in chronic kidney disease: Secondary | ICD-10-CM | POA: Diagnosis not present

## 2022-05-23 DIAGNOSIS — Z7901 Long term (current) use of anticoagulants: Secondary | ICD-10-CM | POA: Diagnosis not present

## 2022-05-23 DIAGNOSIS — N400 Enlarged prostate without lower urinary tract symptoms: Secondary | ICD-10-CM | POA: Diagnosis not present

## 2022-05-24 DIAGNOSIS — E1169 Type 2 diabetes mellitus with other specified complication: Secondary | ICD-10-CM | POA: Diagnosis not present

## 2022-05-24 DIAGNOSIS — I480 Paroxysmal atrial fibrillation: Secondary | ICD-10-CM | POA: Diagnosis not present

## 2022-05-24 DIAGNOSIS — E1122 Type 2 diabetes mellitus with diabetic chronic kidney disease: Secondary | ICD-10-CM | POA: Diagnosis not present

## 2022-05-24 DIAGNOSIS — D631 Anemia in chronic kidney disease: Secondary | ICD-10-CM | POA: Diagnosis not present

## 2022-05-24 DIAGNOSIS — N183 Chronic kidney disease, stage 3 unspecified: Secondary | ICD-10-CM | POA: Diagnosis not present

## 2022-05-24 DIAGNOSIS — R001 Bradycardia, unspecified: Secondary | ICD-10-CM | POA: Diagnosis not present

## 2022-05-24 DIAGNOSIS — I5022 Chronic systolic (congestive) heart failure: Secondary | ICD-10-CM | POA: Diagnosis not present

## 2022-05-24 DIAGNOSIS — I429 Cardiomyopathy, unspecified: Secondary | ICD-10-CM | POA: Diagnosis not present

## 2022-05-24 DIAGNOSIS — N401 Enlarged prostate with lower urinary tract symptoms: Secondary | ICD-10-CM | POA: Diagnosis not present

## 2022-05-24 DIAGNOSIS — I130 Hypertensive heart and chronic kidney disease with heart failure and stage 1 through stage 4 chronic kidney disease, or unspecified chronic kidney disease: Secondary | ICD-10-CM | POA: Diagnosis not present

## 2022-05-24 DIAGNOSIS — E785 Hyperlipidemia, unspecified: Secondary | ICD-10-CM | POA: Diagnosis not present

## 2022-05-24 DIAGNOSIS — S72042D Displaced fracture of base of neck of left femur, subsequent encounter for closed fracture with routine healing: Secondary | ICD-10-CM | POA: Diagnosis not present

## 2022-05-25 DIAGNOSIS — Z87891 Personal history of nicotine dependence: Secondary | ICD-10-CM | POA: Diagnosis not present

## 2022-05-25 DIAGNOSIS — E7800 Pure hypercholesterolemia, unspecified: Secondary | ICD-10-CM | POA: Diagnosis not present

## 2022-05-25 DIAGNOSIS — H9193 Unspecified hearing loss, bilateral: Secondary | ICD-10-CM | POA: Diagnosis not present

## 2022-05-25 DIAGNOSIS — I4892 Unspecified atrial flutter: Secondary | ICD-10-CM | POA: Diagnosis not present

## 2022-05-25 DIAGNOSIS — I429 Cardiomyopathy, unspecified: Secondary | ICD-10-CM | POA: Diagnosis not present

## 2022-05-25 DIAGNOSIS — E109 Type 1 diabetes mellitus without complications: Secondary | ICD-10-CM | POA: Diagnosis not present

## 2022-05-25 DIAGNOSIS — D8685 Sarcoid myocarditis: Secondary | ICD-10-CM | POA: Diagnosis not present

## 2022-05-25 DIAGNOSIS — Z9181 History of falling: Secondary | ICD-10-CM | POA: Diagnosis not present

## 2022-05-25 DIAGNOSIS — Z7901 Long term (current) use of anticoagulants: Secondary | ICD-10-CM | POA: Diagnosis not present

## 2022-05-25 DIAGNOSIS — R413 Other amnesia: Secondary | ICD-10-CM | POA: Diagnosis not present

## 2022-05-26 ENCOUNTER — Telehealth: Payer: Self-pay | Admitting: *Deleted

## 2022-05-26 DIAGNOSIS — D8685 Sarcoid myocarditis: Secondary | ICD-10-CM | POA: Diagnosis not present

## 2022-05-26 DIAGNOSIS — I429 Cardiomyopathy, unspecified: Secondary | ICD-10-CM | POA: Diagnosis not present

## 2022-05-26 DIAGNOSIS — E7800 Pure hypercholesterolemia, unspecified: Secondary | ICD-10-CM | POA: Diagnosis not present

## 2022-05-26 DIAGNOSIS — Z7901 Long term (current) use of anticoagulants: Secondary | ICD-10-CM | POA: Diagnosis not present

## 2022-05-26 DIAGNOSIS — Z9181 History of falling: Secondary | ICD-10-CM | POA: Diagnosis not present

## 2022-05-26 DIAGNOSIS — E109 Type 1 diabetes mellitus without complications: Secondary | ICD-10-CM | POA: Diagnosis not present

## 2022-05-26 DIAGNOSIS — Z87891 Personal history of nicotine dependence: Secondary | ICD-10-CM | POA: Diagnosis not present

## 2022-05-26 DIAGNOSIS — I4892 Unspecified atrial flutter: Secondary | ICD-10-CM | POA: Diagnosis not present

## 2022-05-26 DIAGNOSIS — R413 Other amnesia: Secondary | ICD-10-CM | POA: Diagnosis not present

## 2022-05-26 DIAGNOSIS — H9193 Unspecified hearing loss, bilateral: Secondary | ICD-10-CM | POA: Diagnosis not present

## 2022-05-26 NOTE — Patient Outreach (Signed)
  Care Coordination   Follow Up Visit Note   05/26/2022 Name: Cody Howard MRN: [id number] DOB: [birth year]  Cody Howard is a 88 y.o. year old male who sees [name], MD for primary care. I spoke with Wife of Cody Howard by phone today.  What matters to the patients health and wellness today?  Wife report patient had fall again on Saturday, resulting in ED visit.  Doing better now, still has assistance of adult children.     Goals Addressed             This Visit's Progress    No recurrent falls   Not on track    Care Coordination Interventions: Provided written and verbal education re: potential causes of falls and Fall prevention strategies Advised patient of importance of notifying provider of falls Assessed for falls since last encounter Advised patient to discuss Caregiver resources for patients with dementia with provider Collaborate with CSW and care guide to provide resources for caring for patients with dementia Discussed participation with home health PT/OT for balance and strength, has [facility], also has nurse coming in the [facility] follow up with PCP on 3/25         SDOH assessments and interventions completed:  No     Care Coordination Interventions:  Yes, provided   Interventions Today    Flowsheet Row Most Recent Value  Chronic Disease   Chronic disease during today's visit Other  [fall]  General Interventions   General Interventions Discussed/Reviewed General Interventions Reviewed, Doctor Visits  Doctor Visits Discussed/Reviewed Doctor Visits Reviewed, PCP  PCP/Specialist Visits Compliance with follow-up visit  Education Interventions   Education Provided Provided Education  Provided Verbal Education On Other, When to see the doctor  [fall]  Safety Interventions   Safety Discussed/Reviewed Home Safety, Fall Risk  Home Safety Assistive Devices        Follow up plan: Follow up call scheduled for 4/11    Encounter  Outcome:  Pt. Visit Completed   Valente David, RN, MSN, [place] Care Management Care Management Coordinator [phone]

## 2022-05-27 DIAGNOSIS — Z9181 History of falling: Secondary | ICD-10-CM | POA: Diagnosis not present

## 2022-05-27 DIAGNOSIS — D8685 Sarcoid myocarditis: Secondary | ICD-10-CM | POA: Diagnosis not present

## 2022-05-27 DIAGNOSIS — E109 Type 1 diabetes mellitus without complications: Secondary | ICD-10-CM | POA: Diagnosis not present

## 2022-05-27 DIAGNOSIS — Z87891 Personal history of nicotine dependence: Secondary | ICD-10-CM | POA: Diagnosis not present

## 2022-05-27 DIAGNOSIS — H9193 Unspecified hearing loss, bilateral: Secondary | ICD-10-CM | POA: Diagnosis not present

## 2022-05-27 DIAGNOSIS — I4892 Unspecified atrial flutter: Secondary | ICD-10-CM | POA: Diagnosis not present

## 2022-05-27 DIAGNOSIS — Z7901 Long term (current) use of anticoagulants: Secondary | ICD-10-CM | POA: Diagnosis not present

## 2022-05-27 DIAGNOSIS — I429 Cardiomyopathy, unspecified: Secondary | ICD-10-CM | POA: Diagnosis not present

## 2022-05-27 DIAGNOSIS — E7800 Pure hypercholesterolemia, unspecified: Secondary | ICD-10-CM | POA: Diagnosis not present

## 2022-05-27 DIAGNOSIS — R413 Other amnesia: Secondary | ICD-10-CM | POA: Diagnosis not present

## 2022-05-31 ENCOUNTER — Encounter: Payer: Self-pay | Admitting: Family Medicine

## 2022-05-31 ENCOUNTER — Ambulatory Visit (INDEPENDENT_AMBULATORY_CARE_PROVIDER_SITE_OTHER): Payer: Medicare HMO | Admitting: Family Medicine

## 2022-05-31 VITALS — BP 118/60 | HR 67 | Ht 70.0 in | Wt 196.0 lb

## 2022-05-31 DIAGNOSIS — T148XXA Other injury of unspecified body region, initial encounter: Secondary | ICD-10-CM

## 2022-05-31 DIAGNOSIS — E875 Hyperkalemia: Secondary | ICD-10-CM

## 2022-05-31 DIAGNOSIS — R55 Syncope and collapse: Secondary | ICD-10-CM

## 2022-05-31 MED ORDER — MUPIROCIN 2 % EX OINT: 1.0000 | TOPICAL_OINTMENT | Freq: Two times a day (BID) | CUTANEOUS | 0 refills | Status: DC

## 2022-05-31 NOTE — Progress Notes (Signed)
Date:  05/31/2022   Name:  Cody Howard   DOB:  Dec 26, 1934   MRN:  [id number]   Chief Complaint: hospital admit (Syncope and collapse/ fall on 05/22/22. D/c on 05/24/22 and TOC placed on 05/26/2022. Has place on R) arm near elbow that will not heal well.)  Follow up Hospitalization  Patient was admitted to [facility] on 05/22/22 and discharged on 05/24/22. He was treated for syncope/hyperkalemia. Treatment for this included debridement skin avulsion/right arm. Telephone follow up was done on 05/26/22 He reports good compliance with treatment. He reports this condition is improved.Marland Kitchen  ----------------------------------------------------------------------------------------- -         Lab Results  Component Value Date   NA 141 06/01/2021   K 5.1 06/01/2021   CO2 19 (L) 06/01/2021   GLUCOSE 120 (H) 06/01/2021   BUN 35 (H) 06/01/2021   CREATININE 2.30 (H) 06/01/2021   CALCIUM 8.5 (L) 06/01/2021   EGFR 27 (L) 06/01/2021   GFRNONAA 26 (L) 01/11/2020   Lab Results  Component Value Date   CHOL 146 06/01/2021   HDL 55 06/01/2021   LDLCALC 73 06/01/2021   TRIG 98 06/01/2021   CHOLHDL 2.3 11/29/2016   Lab Results  Component Value Date   TSH 2.710 05/31/2019   Lab Results  Component Value Date   HGBA1C 6.7 09/21/2021   Lab Results  Component Value Date   WBC 9.2 03/27/2020   HGB 12.4 (L) 03/27/2020   HCT 36.1 (L) 03/27/2020   MCV 99 (H) 03/27/2020   PLT 156 03/27/2020   Lab Results  Component Value Date   ALT 31 06/01/2021   AST 32 06/01/2021   ALKPHOS 91 06/01/2021   BILITOT 0.3 06/01/2021   No results found for: "25OHVITD2", "25OHVITD3", "VD25OH"   Review of Systems  Constitutional:  Negative for unexpected weight change.  HENT:  Negative for congestion, postnasal drip and sore throat.   Respiratory:  Negative for chest tightness and wheezing.   Cardiovascular:  Negative for chest pain, palpitations and leg swelling.  Gastrointestinal:  Negative  for abdominal distention, constipation and diarrhea.  Endocrine: Negative for polydipsia and polyuria.  Genitourinary:  Negative for difficulty urinating.    Patient Active Problem List   Diagnosis Date Noted   Acute rhinosinusitis 12/08/2020   Difficulty walking 04/24/2020   Sleeping difficulty 04/24/2020   Moderate mitral regurgitation 09/12/2019   Status post hip hemiarthroplasty 06/28/2019   Atrial fibrillation, chronic ([place]) 06/27/2019   Depression 06/27/2019   Fall at home, initial encounter 06/27/2019   Closed displaced fracture of left femoral neck ([place]) 06/27/2019   History of anemia 05/31/2019   Benign prostatic hyperplasia with lower urinary tract symptoms 05/31/2019   Cerebral atrophy, mild ([place]) 05/31/2019   Reactive depression 05/31/2019   Essential hypertension 05/31/2019   Hiatal hernia 05/31/2019   Cardiac syncope 02/20/2019   Loss of memory 05/25/2018   Dizziness 04/12/2018   TIA (transient ischemic attack) [id number]   Chronic systolic CHF (congestive heart failure), NYHA class 3 ([place]) 11/29/2017   CKD (chronic kidney disease) stage 3, GFR 30-59 ml/min (HCC) 08/18/2017   Bradycardia 08/18/2017   Atrial flutter, paroxysmal ([place]) 07/21/2017   Functional dyspnea 07/06/2017   Cardiomyopathy ([place]) 07/06/2017   Hyperlipidemia, mixed 06/20/2017   Hyperlipidemia associated with type 2 diabetes mellitus ([place]) 04/06/2016   Type II diabetes mellitus with renal manifestations ([place]) 02/10/2015   Personal history of other diseases of male genital organs 02/10/2015   Sebaceous cyst 08/28/2014    No  Known Allergies  Past Surgical History:  Procedure Laterality Date   COLONOSCOPY N/A 02/26/2015   Procedure: COLONOSCOPY;  Surgeon: Hulen Luster, MD;  Location: [place];  Service: Gastroenterology;  Laterality: N/A;   COLONOSCOPY WITH PROPOFOL N/A 11/02/2017   Procedure: COLONOSCOPY WITH PROPOFOL;  Surgeon: Toledo, Benay Pike, MD;  Location: [facility];   Service: Gastroenterology;  Laterality: N/A;   ESOPHAGOGASTRODUODENOSCOPY N/A 02/26/2015   Procedure: ESOPHAGOGASTRODUODENOSCOPY (EGD);  Surgeon: Hulen Luster, MD;  Location: [place];  Service: Gastroenterology;  Laterality: N/A;  Diabetic - oral meds   ESOPHAGOGASTRODUODENOSCOPY (EGD) WITH PROPOFOL N/A 11/02/2017   Procedure: ESOPHAGOGASTRODUODENOSCOPY (EGD) WITH PROPOFOL;  Surgeon: Toledo, Benay Pike, MD;  Location: [facility];  Service: Gastroenterology;  Laterality: N/A;   HIP ARTHROPLASTY Left 06/28/2019   Procedure: ARTHROPLASTY BIPOLAR HIP (HEMIARTHROPLASTY);  Surgeon: Corky Mull, MD;  Location: [facility];  Service: Orthopedics;  Laterality: Left;   KIDNEY SURGERY Right    surgery when 88 years old   POLYPECTOMY  02/26/2015   Procedure: POLYPECTOMY;  Surgeon: Hulen Luster, MD;  Location: [facility];  Service: Gastroenterology;;   sebaceous cyst removal Right    located right of spine on upper back    Social History   Tobacco Use   Smoking status: Former    Types: Cigarettes    Quit date: 03/08/1978    Years since quitting: 44.2   Smokeless tobacco: Never  Vaping Use   Vaping Use: Never used  Substance Use Topics   Alcohol use: Not Currently   Drug use: Never     Medication list has been reviewed and updated.  Current Meds  Medication Sig   apixaban (ELIQUIS) 2.5 MG TABS tablet Take by mouth.   carvedilol (COREG) 3.125 MG tablet Take 1 tablet (3.125 mg total) by mouth 2 (two) times daily with a meal.   Cholecalciferol 125 MCG (5000 UT) TABS Take 1 tablet (5,000 Units total) by mouth daily.   ferrous sulfate 325 (65 FE) MG tablet Take 1 tablet (325 mg total) by mouth daily with breakfast.   finasteride (PROSCAR) 5 MG tablet Take 1 tablet by mouth daily.   glipiZIDE (GLUCOTROL) 5 MG tablet Take 1 tablet (5 mg total) by mouth 2 (two) times daily before a meal.   glucose blood (ONETOUCH ULTRA) test strip USE TO TEST ONCE A DAY   ibuprofen (ADVIL) 200 MG  tablet Take 200 mg by mouth every 6 (six) hours as needed.   Lancets (ONETOUCH DELICA PLUS 123XX123) MISC USE TO TEST ONCE DAILY   losartan (COZAAR) 100 MG tablet TAKE 1/2 TABLET BY MOUTH DAILY.   montelukast (SINGULAIR) 10 MG tablet TAKE 1 TABLET BY MOUTH EVERY DAY AT BEDTIME.   Probiotic Product (ALIGN) 4 MG CAPS Take 1 capsule (4 mg total) by mouth at bedtime.   sertraline (ZOLOFT) 25 MG tablet TAKE (1) TABLET BY MOUTH EVERY DAY   simvastatin (ZOCOR) 80 MG tablet Take 1 tablet (80 mg total) by mouth every evening.   [DISCONTINUED] warfarin (COUMADIN) 4 MG tablet Take 4 mg by mouth daily.       05/31/2022    1:34 PM 10/01/2021    1:55 PM 06/01/2021    1:46 PM 12/08/2020    5:04 PM  GAD 7 : Generalized Anxiety Score  Nervous, Anxious, on Edge 0 0 1 0  Control/stop worrying 0 0 0 0  Worry too much - different things 0 0 0 0  Trouble relaxing 0 0  0 0  Restless 0 0 0 0  Easily annoyed or irritable 0 0 1 0  Afraid - awful might happen 0 0 0 0  Total GAD 7 Score 0 0 2 0  Anxiety Difficulty Not difficult at all Not difficult at all Not difficult at all Not difficult at all       05/31/2022    1:33 PM 02/03/2022    1:17 PM 10/01/2021    1:55 PM  Depression screen PHQ 2/9  Decreased Interest 0 0 0  Down, Depressed, Hopeless 0 0 0  PHQ - 2 Score 0 0 0  Altered sleeping 0 0 0  Tired, decreased energy 0 0 0  Change in appetite 0 0 0  Feeling bad or failure about yourself  0 0 0  Trouble concentrating 0 0 0  Moving slowly or fidgety/restless 0 0 0  Suicidal thoughts 0 0 0  PHQ-9 Score 0 0 0  Difficult doing work/chores Not difficult at all Not difficult at all Not difficult at all    BP Readings from Last 3 Encounters:  05/31/22 118/60  01/01/22 (!) 159/73  10/01/21 (!) 110/50    Physical Exam Vitals and nursing note reviewed.  HENT:     Head: Normocephalic.     Right Ear: Tympanic membrane and external ear normal.     Left Ear: Tympanic membrane, ear canal and external ear  normal.  Cardiovascular:     Rate and Rhythm: Normal rate.     Pulses: Normal pulses.     Heart sounds: No murmur heard.    No friction rub. No gallop.  Pulmonary:     Breath sounds: No wheezing, rhonchi or rales.  Abdominal:     Palpations: There is no hepatomegaly or splenomegaly.     Tenderness: There is no abdominal tenderness.  Musculoskeletal:     Cervical back: Neck supple.  Skin:    Capillary Refill: Capillary refill takes less than 2 seconds.     Findings: Wound present.  Neurological:     Mental Status: He is alert.     Wt Readings from Last 3 Encounters:  05/31/22 196 lb (88.9 kg)  01/01/22 198 lb (89.8 kg)  10/01/21 192 lb (87.1 kg)    BP 118/60   Pulse 67   Ht 5\' 10"  (1.778 m)   Wt 196 lb (88.9 kg)   SpO2 95%   BMI 28.12 kg/m  [facility] [place] [facility] Discharge Acute Issues Care Follow Up                                                                        Patient Demographics  Cody Howard, is a 88 y.o. male  DOB 13-Dec-1934  MRN [id number].  Primary MD  [name], MD   Reason for TCC follow Up - recheck hyperkalemia/followup syncope   Past Medical History:  Diagnosis Date   AF (atrial  fibrillation) ([place])    Arthritis    hands   Chronic kidney disease    Diabetes mellitus without complication ([place])    Hyperlipidemia    Hypertension    Prostate enlargement     Past Surgical History:  Procedure Laterality Date   COLONOSCOPY N/A 02/26/2015   Procedure: COLONOSCOPY;  Surgeon: Hulen Luster, MD;  Location: [place];  Service: Gastroenterology;  Laterality: N/A;   COLONOSCOPY WITH PROPOFOL N/A 11/02/2017   Procedure: COLONOSCOPY WITH PROPOFOL;  Surgeon: Toledo, Benay Pike, MD;  Location: [facility];  Service: Gastroenterology;  Laterality: N/A;   ESOPHAGOGASTRODUODENOSCOPY N/A  02/26/2015   Procedure: ESOPHAGOGASTRODUODENOSCOPY (EGD);  Surgeon: Hulen Luster, MD;  Location: [place];  Service: Gastroenterology;  Laterality: N/A;  Diabetic - oral meds   ESOPHAGOGASTRODUODENOSCOPY (EGD) WITH PROPOFOL N/A 11/02/2017   Procedure: ESOPHAGOGASTRODUODENOSCOPY (EGD) WITH PROPOFOL;  Surgeon: Toledo, Benay Pike, MD;  Location: [facility];  Service: Gastroenterology;  Laterality: N/A;   HIP ARTHROPLASTY Left 06/28/2019   Procedure: ARTHROPLASTY BIPOLAR HIP (HEMIARTHROPLASTY);  Surgeon: Corky Mull, MD;  Location: [facility];  Service: Orthopedics;  Laterality: Left;   KIDNEY SURGERY Right    surgery when 88 years old   POLYPECTOMY  02/26/2015   Procedure: POLYPECTOMY;  Surgeon: Hulen Luster, MD;  Location: [place];  Service: Gastroenterology;;   sebaceous cyst removal Right    located right of spine on upper back  Recent HPI and Hospital course: Patient has follow-up from a syncopal episode presumably from orthostatic hypotension exacerbated by dehydration on Lasix and Aricept.  Patient has been cautioned to ambulate without walker at this time and is gradually improving status post hospitalization. [facility] acute care issue to be followed in the clinic: Recheck of blood pressure was noted to be in normal range and stable and is tolerating being off Lasix and Aricept.  Follow-up for hyperkalemia and will obtain renal function panel for electrolytes and GFR.  Avulsion wound is healing well and patient has been given Bactroban to be used on a as needed basis.         Subjective:   Cody Howard today has, No headache, No chest pain, No abdominal pain - No Nausea, No new weakness tingling or numbness, No Cough - SOB.  No dizziness lightheadedness-no exacerbation of wound or area of bruising or head injury of fall.  Assessment & Plan  1. Vasovagal syncope Follow-up vasovagal/orthostatic hypotension syncopal episode patient's blood pressure is stable at  118/60 and understands that he needs to raise from bed from a sitting and to stay there for moment and recalibrate and then able to stand and has been suggested to use walker and will lift regimen for ambulation in the future.  2. Hyperkalemia Lasix has been discontinued and we will recheck renal function panel to reevaluate for hyperkalemia. - Renal Function Panel  3. Abrasion Avulsion wound is healing well and we will use Bactroban to be used on an as-needed basis. - mupirocin ointment (BACTROBAN) 2 %; Apply 1 Application topically 2 (two) times daily.  Dispense: 22 g; Refill: 0   .   Reason for frequent admissions/ER visits if we controlled measures as above we will unlikely him the falls as previously encountered.      Objective:   Vitals:   05/31/22 1325  BP: 118/60  Pulse: 67  SpO2: 95%  Weight: 196 lb (88.9 kg)  Height: 5\' 10"  (1.778 m)    Wt Readings from Last 3 Encounters:  05/31/22 196 lb (88.9 kg)  01/01/22 198 lb (89.8 kg)  10/01/21 192 lb (87.1 kg)    Allergies as of 05/31/2022   No Known Allergies      Medication List        Accurate as of May 31, 2022  1:56 PM. If you have any questions, ask your nurse or doctor.          STOP taking these medications    warfarin 4 MG tablet Commonly known as: COUMADIN Stopped by: Otilio Miu, MD       TAKE these medications    Align 4 MG Caps Take 1 capsule (4 mg total) by mouth at bedtime.   apixaban 2.5 MG Tabs tablet Commonly known as: ELIQUIS Take by mouth.   carvedilol 3.125 MG tablet Commonly known as: COREG Take 1 tablet (3.125 mg total) by mouth 2 (two) times daily with a meal.   Cholecalciferol 125 MCG (5000 UT) Tabs Take 1 tablet (5,000 Units total) by mouth daily.   donepezil 10 MG tablet Commonly known as: ARICEPT Take 1 tablet (10 mg total) by mouth daily. Chipper Herb   ferrous sulfate 325 (65 FE) MG tablet Take 1 tablet (325 mg total) by mouth daily with breakfast.    finasteride 5 MG tablet Commonly known as: PROSCAR Take 1 tablet by mouth daily.   furosemide 20 MG tablet Commonly known as: LASIX TAKE (1) TABLET BY MOUTH EVERY DAY   glipiZIDE 5 MG tablet Commonly known as: GLUCOTROL Take 1 tablet (5 mg total) by mouth 2 (two) times daily before a meal.   ibuprofen 200 MG tablet Commonly known as: ADVIL Take 200 mg by mouth every 6 (six) hours as needed.   losartan 100 MG tablet Commonly known as: COZAAR TAKE 1/2 TABLET BY MOUTH DAILY.   montelukast 10 MG tablet Commonly known as: SINGULAIR TAKE 1 TABLET BY MOUTH EVERY DAY AT BEDTIME.   OneTouch Delica Plus 0000000 Misc USE TO TEST ONCE DAILY   OneTouch Ultra test strip Generic drug: glucose blood USE TO TEST ONCE A DAY   sertraline 25 MG tablet Commonly known as: ZOLOFT TAKE (1) TABLET BY MOUTH EVERY DAY   simvastatin 80 MG tablet Commonly known as: ZOCOR Take 1 tablet (80 mg total) by mouth every evening.         Physical Exam: Constitutional: Patient appears well-developed and well-nourished. Not in obvious distress. HENT: Normocephalic, atraumatic, External right and left ear normal. Oropharynx is clear and moist.  Eyes: Conjunctivae and EOM are normal. PERRLA, no scleral icterus. Neck: Normal ROM. Neck supple. No JVD. No tracheal deviation. No thyromegaly. CVS: RRR, S1/S2 +, no murmurs, no gallops, no carotid bruit.  Pulmonary: Effort and breath sounds normal, no stridor, rhonchi, wheezes, rales.  Abdominal: Soft. BS +, no distension, tenderness, rebound or guarding.  Musculoskeletal: Normal range of motion. No edema and no tenderness.  Lymphadenopathy: No lymphadenopathy noted, cervical, inguinal or axillary Neuro: Alert. Normal reflexes, muscle tone coordination. No cranial nerve deficit. Skin: Skin is warm and dry. No rash noted. Not diaphoretic. No erythema. No pallor. Psychiatric: Normal mood and affect. Behavior, judgment, thought content normal.   Data  Review   Micro Results No results found for this or any previous visit (from the past 240 hour(s)).   CBC No results for input(s): "WBC", "HGB", "HCT", "PLT", "MCV", "MCH", "MCHC", "RDW", "LYMPHSABS", "MONOABS", "EOSABS", "BASOSABS", "BANDABS" in the last 168 hours.  Invalid input(s): "NEUTRABS", "BANDSABD"  Chemistries  No results for  input(s): "NA", "K", "CL", "CO2", "GLUCOSE", "BUN", "CREATININE", "CALCIUM", "MG", "AST", "ALT", "ALKPHOS", "BILITOT" in the last 168 hours.  Invalid input(s): "GFRCGP" ------------------------------------------------------------------------------------------------------------------ CrCl cannot be calculated (Patient's most recent lab result is older than the maximum 21 days allowed.). ------------------------------------------------------------------------------------------------------------------ No results for input(s): "HGBA1C" in the last 72 hours. ------------------------------------------------------------------------------------------------------------------ No results for input(s): "CHOL", "HDL", "LDLCALC", "TRIG", "CHOLHDL", "LDLDIRECT" in the last 72 hours. ------------------------------------------------------------------------------------------------------------------ No results for input(s): "TSH", "T4TOTAL", "T3FREE", "THYROIDAB" in the last 72 hours.  Invalid input(s): "FREET3" ------------------------------------------------------------------------------------------------------------------ No results for input(s): "VITAMINB12", "FOLATE", "FERRITIN", "TIBC", "IRON", "RETICCTPCT" in the last 72 hours.  Coagulation profile No results for input(s): "INR", "PROTIME" in the last 168 hours.  No results for input(s): "DDIMER" in the last 72 hours.  Cardiac Enzymes No results for input(s): "CKMB", "TROPONINI", "MYOGLOBIN" in the last 168 hours.  Invalid input(s):  "CK" ------------------------------------------------------------------------------------------------------------------ Invalid input(s): "POCBNP" Time spent in minutes [place] M.D on 05/31/2022 at 1:56 PM   Disclaimer: This note may have been dictated with voice recognition software. Similar sounding words can inadvertently be transcribed and this note may contain transcription errors which may not have been corrected upon publication of note.   Assessment and Plan:  1. Vasovagal syncope As noted above  2. Hyperkalemia As noted above - Renal Function Panel  3. Abrasion As noted above - mupirocin ointment (BACTROBAN) 2 %; Apply 1 Application topically 2 (two) times daily.  Dispense: 22 g; Refill: 0    Otilio Miu, MD

## 2022-06-01 DIAGNOSIS — E1169 Type 2 diabetes mellitus with other specified complication: Secondary | ICD-10-CM | POA: Diagnosis not present

## 2022-06-01 DIAGNOSIS — S51011D Laceration without foreign body of right elbow, subsequent encounter: Secondary | ICD-10-CM | POA: Diagnosis not present

## 2022-06-01 DIAGNOSIS — I480 Paroxysmal atrial fibrillation: Secondary | ICD-10-CM | POA: Diagnosis not present

## 2022-06-01 DIAGNOSIS — S81011D Laceration without foreign body, right knee, subsequent encounter: Secondary | ICD-10-CM | POA: Diagnosis not present

## 2022-06-01 DIAGNOSIS — Z8781 Personal history of (healed) traumatic fracture: Secondary | ICD-10-CM | POA: Diagnosis not present

## 2022-06-01 DIAGNOSIS — I429 Cardiomyopathy, unspecified: Secondary | ICD-10-CM | POA: Diagnosis not present

## 2022-06-01 DIAGNOSIS — D8685 Sarcoid myocarditis: Secondary | ICD-10-CM | POA: Diagnosis not present

## 2022-06-01 DIAGNOSIS — Z8673 Personal history of transient ischemic attack (TIA), and cerebral infarction without residual deficits: Secondary | ICD-10-CM | POA: Diagnosis not present

## 2022-06-01 DIAGNOSIS — D631 Anemia in chronic kidney disease: Secondary | ICD-10-CM | POA: Diagnosis not present

## 2022-06-01 DIAGNOSIS — N400 Enlarged prostate without lower urinary tract symptoms: Secondary | ICD-10-CM | POA: Diagnosis not present

## 2022-06-01 DIAGNOSIS — Z87891 Personal history of nicotine dependence: Secondary | ICD-10-CM | POA: Diagnosis not present

## 2022-06-01 DIAGNOSIS — Z86010 Personal history of colonic polyps: Secondary | ICD-10-CM | POA: Diagnosis not present

## 2022-06-01 DIAGNOSIS — Z7901 Long term (current) use of anticoagulants: Secondary | ICD-10-CM | POA: Diagnosis not present

## 2022-06-01 DIAGNOSIS — Z7984 Long term (current) use of oral hypoglycemic drugs: Secondary | ICD-10-CM | POA: Diagnosis not present

## 2022-06-01 DIAGNOSIS — E7849 Other hyperlipidemia: Secondary | ICD-10-CM | POA: Diagnosis not present

## 2022-06-01 DIAGNOSIS — H9193 Unspecified hearing loss, bilateral: Secondary | ICD-10-CM | POA: Diagnosis not present

## 2022-06-01 DIAGNOSIS — N184 Chronic kidney disease, stage 4 (severe): Secondary | ICD-10-CM | POA: Diagnosis not present

## 2022-06-01 DIAGNOSIS — I5022 Chronic systolic (congestive) heart failure: Secondary | ICD-10-CM | POA: Diagnosis not present

## 2022-06-01 DIAGNOSIS — E875 Hyperkalemia: Secondary | ICD-10-CM | POA: Diagnosis not present

## 2022-06-01 DIAGNOSIS — Z86018 Personal history of other benign neoplasm: Secondary | ICD-10-CM | POA: Diagnosis not present

## 2022-06-01 DIAGNOSIS — F32A Depression, unspecified: Secondary | ICD-10-CM | POA: Diagnosis not present

## 2022-06-01 DIAGNOSIS — I130 Hypertensive heart and chronic kidney disease with heart failure and stage 1 through stage 4 chronic kidney disease, or unspecified chronic kidney disease: Secondary | ICD-10-CM | POA: Diagnosis not present

## 2022-06-01 DIAGNOSIS — E1122 Type 2 diabetes mellitus with diabetic chronic kidney disease: Secondary | ICD-10-CM | POA: Diagnosis not present

## 2022-06-01 LAB — RENAL FUNCTION PANEL
Albumin: 3.7 g/dL (ref 3.7–4.7)
BUN/Creatinine Ratio: 14 (ref 10–24)
BUN: 35 mg/dL — ABNORMAL HIGH (ref 8–27)
CO2: 17 mmol/L — ABNORMAL LOW (ref 20–29)
Calcium: 8.3 mg/dL — ABNORMAL LOW (ref 8.6–10.2)
Chloride: 111 mmol/L — ABNORMAL HIGH (ref 96–106)
Creatinine, Ser: 2.58 mg/dL — ABNORMAL HIGH (ref 0.76–1.27)
Glucose: 156 mg/dL — ABNORMAL HIGH (ref 70–99)
Phosphorus: 3.1 mg/dL (ref 2.8–4.1)
Potassium: 5.2 mmol/L (ref 3.5–5.2)
Sodium: 143 mmol/L (ref 134–144)
eGFR: 24 mL/min/{1.73_m2} — ABNORMAL LOW (ref >59)

## 2022-06-02 DIAGNOSIS — S51011D Laceration without foreign body of right elbow, subsequent encounter: Secondary | ICD-10-CM | POA: Diagnosis not present

## 2022-06-02 DIAGNOSIS — E1122 Type 2 diabetes mellitus with diabetic chronic kidney disease: Secondary | ICD-10-CM | POA: Diagnosis not present

## 2022-06-02 DIAGNOSIS — D8685 Sarcoid myocarditis: Secondary | ICD-10-CM | POA: Diagnosis not present

## 2022-06-02 DIAGNOSIS — Z8673 Personal history of transient ischemic attack (TIA), and cerebral infarction without residual deficits: Secondary | ICD-10-CM | POA: Diagnosis not present

## 2022-06-02 DIAGNOSIS — I480 Paroxysmal atrial fibrillation: Secondary | ICD-10-CM | POA: Diagnosis not present

## 2022-06-02 DIAGNOSIS — S81011D Laceration without foreign body, right knee, subsequent encounter: Secondary | ICD-10-CM | POA: Diagnosis not present

## 2022-06-02 DIAGNOSIS — Z8781 Personal history of (healed) traumatic fracture: Secondary | ICD-10-CM | POA: Diagnosis not present

## 2022-06-02 DIAGNOSIS — H9193 Unspecified hearing loss, bilateral: Secondary | ICD-10-CM | POA: Diagnosis not present

## 2022-06-02 DIAGNOSIS — Z86018 Personal history of other benign neoplasm: Secondary | ICD-10-CM | POA: Diagnosis not present

## 2022-06-02 DIAGNOSIS — N400 Enlarged prostate without lower urinary tract symptoms: Secondary | ICD-10-CM | POA: Diagnosis not present

## 2022-06-02 DIAGNOSIS — N184 Chronic kidney disease, stage 4 (severe): Secondary | ICD-10-CM | POA: Diagnosis not present

## 2022-06-02 DIAGNOSIS — Z87891 Personal history of nicotine dependence: Secondary | ICD-10-CM | POA: Diagnosis not present

## 2022-06-02 DIAGNOSIS — F32A Depression, unspecified: Secondary | ICD-10-CM | POA: Diagnosis not present

## 2022-06-02 DIAGNOSIS — I130 Hypertensive heart and chronic kidney disease with heart failure and stage 1 through stage 4 chronic kidney disease, or unspecified chronic kidney disease: Secondary | ICD-10-CM | POA: Diagnosis not present

## 2022-06-02 DIAGNOSIS — E1169 Type 2 diabetes mellitus with other specified complication: Secondary | ICD-10-CM | POA: Diagnosis not present

## 2022-06-02 DIAGNOSIS — Z86010 Personal history of colonic polyps: Secondary | ICD-10-CM | POA: Diagnosis not present

## 2022-06-02 DIAGNOSIS — Z7901 Long term (current) use of anticoagulants: Secondary | ICD-10-CM | POA: Diagnosis not present

## 2022-06-02 DIAGNOSIS — I5022 Chronic systolic (congestive) heart failure: Secondary | ICD-10-CM | POA: Diagnosis not present

## 2022-06-02 DIAGNOSIS — I429 Cardiomyopathy, unspecified: Secondary | ICD-10-CM | POA: Diagnosis not present

## 2022-06-02 DIAGNOSIS — D631 Anemia in chronic kidney disease: Secondary | ICD-10-CM | POA: Diagnosis not present

## 2022-06-02 DIAGNOSIS — E7849 Other hyperlipidemia: Secondary | ICD-10-CM | POA: Diagnosis not present

## 2022-06-02 DIAGNOSIS — Z7984 Long term (current) use of oral hypoglycemic drugs: Secondary | ICD-10-CM | POA: Diagnosis not present

## 2022-06-02 DIAGNOSIS — E875 Hyperkalemia: Secondary | ICD-10-CM | POA: Diagnosis not present

## 2022-06-03 ENCOUNTER — Other Ambulatory Visit: Payer: Self-pay | Admitting: Family Medicine

## 2022-06-03 DIAGNOSIS — N184 Chronic kidney disease, stage 4 (severe): Secondary | ICD-10-CM | POA: Diagnosis not present

## 2022-06-03 DIAGNOSIS — D8685 Sarcoid myocarditis: Secondary | ICD-10-CM | POA: Diagnosis not present

## 2022-06-03 DIAGNOSIS — Z7901 Long term (current) use of anticoagulants: Secondary | ICD-10-CM | POA: Diagnosis not present

## 2022-06-03 DIAGNOSIS — H9193 Unspecified hearing loss, bilateral: Secondary | ICD-10-CM | POA: Diagnosis not present

## 2022-06-03 DIAGNOSIS — S81011D Laceration without foreign body, right knee, subsequent encounter: Secondary | ICD-10-CM | POA: Diagnosis not present

## 2022-06-03 DIAGNOSIS — I429 Cardiomyopathy, unspecified: Secondary | ICD-10-CM | POA: Diagnosis not present

## 2022-06-03 DIAGNOSIS — E1169 Type 2 diabetes mellitus with other specified complication: Secondary | ICD-10-CM

## 2022-06-03 DIAGNOSIS — Z8673 Personal history of transient ischemic attack (TIA), and cerebral infarction without residual deficits: Secondary | ICD-10-CM | POA: Diagnosis not present

## 2022-06-03 DIAGNOSIS — E7849 Other hyperlipidemia: Secondary | ICD-10-CM | POA: Diagnosis not present

## 2022-06-03 DIAGNOSIS — E875 Hyperkalemia: Secondary | ICD-10-CM | POA: Diagnosis not present

## 2022-06-03 DIAGNOSIS — Z86018 Personal history of other benign neoplasm: Secondary | ICD-10-CM | POA: Diagnosis not present

## 2022-06-03 DIAGNOSIS — I480 Paroxysmal atrial fibrillation: Secondary | ICD-10-CM | POA: Diagnosis not present

## 2022-06-03 DIAGNOSIS — N400 Enlarged prostate without lower urinary tract symptoms: Secondary | ICD-10-CM | POA: Diagnosis not present

## 2022-06-03 DIAGNOSIS — D631 Anemia in chronic kidney disease: Secondary | ICD-10-CM | POA: Diagnosis not present

## 2022-06-03 DIAGNOSIS — F32A Depression, unspecified: Secondary | ICD-10-CM | POA: Diagnosis not present

## 2022-06-03 DIAGNOSIS — S51011D Laceration without foreign body of right elbow, subsequent encounter: Secondary | ICD-10-CM | POA: Diagnosis not present

## 2022-06-03 DIAGNOSIS — I130 Hypertensive heart and chronic kidney disease with heart failure and stage 1 through stage 4 chronic kidney disease, or unspecified chronic kidney disease: Secondary | ICD-10-CM | POA: Diagnosis not present

## 2022-06-03 DIAGNOSIS — Z7984 Long term (current) use of oral hypoglycemic drugs: Secondary | ICD-10-CM | POA: Diagnosis not present

## 2022-06-03 DIAGNOSIS — Z87891 Personal history of nicotine dependence: Secondary | ICD-10-CM | POA: Diagnosis not present

## 2022-06-03 DIAGNOSIS — Z8781 Personal history of (healed) traumatic fracture: Secondary | ICD-10-CM | POA: Diagnosis not present

## 2022-06-03 DIAGNOSIS — Z86010 Personal history of colonic polyps: Secondary | ICD-10-CM | POA: Diagnosis not present

## 2022-06-03 DIAGNOSIS — E1122 Type 2 diabetes mellitus with diabetic chronic kidney disease: Secondary | ICD-10-CM | POA: Diagnosis not present

## 2022-06-03 DIAGNOSIS — I5022 Chronic systolic (congestive) heart failure: Secondary | ICD-10-CM | POA: Diagnosis not present

## 2022-06-03 NOTE — Telephone Encounter (Signed)
Requested medication (s) are due for refill today: Yes  Requested medication (s) are on the active medication list: Yes  Last refill:  06/01/21  Future visit scheduled:No  Notes to clinic:  Unable to refill per protocol due to failed labs, no updated results.      Requested Prescriptions  Pending Prescriptions Disp Refills   simvastatin (ZOCOR) 80 MG tablet [Pharmacy Med Name: SIMVASTATIN TAB 80MG ] 90 tablet 1    Sig: TAKE 1 TABLET BY MOUTH EVERY EVENING     Cardiovascular:  Antilipid - Statins Failed - 06/03/2022 12:17 PM      Failed - Lipid Panel in normal range within the last 12 months    Cholesterol, Total  Date Value Ref Range Status  06/01/2021 146 100 - 199 mg/dL Final   LDL Chol Calc (NIH)  Date Value Ref Range Status  06/01/2021 73 0 - 99 mg/dL Final   HDL  Date Value Ref Range Status  06/01/2021 55 >39 mg/dL Final   Triglycerides  Date Value Ref Range Status  06/01/2021 98 0 - 149 mg/dL Final         Passed - Patient is not pregnant      Passed - Valid encounter within last 12 months    Recent Outpatient Visits           3 days ago Vasovagal syncope   [place] [facility] [place], Deanna C, MD   8 months ago Cardiomyopathy, unspecified type [facility])   [place] [facility] [place], Deanna C, MD   1 year ago Essential hypertension   [place] [facility] [place], Deanna C, MD   1 year ago Type 2 diabetes mellitus without complication, without long-term current use of insulin ([place])   [place] [facility] at [place], Deanna C, MD   1 year ago Acute rhinosinusitis   [place] [facility], Earley Abide, MD

## 2022-06-04 DIAGNOSIS — S51011D Laceration without foreign body of right elbow, subsequent encounter: Secondary | ICD-10-CM | POA: Diagnosis not present

## 2022-06-04 DIAGNOSIS — N400 Enlarged prostate without lower urinary tract symptoms: Secondary | ICD-10-CM | POA: Diagnosis not present

## 2022-06-04 DIAGNOSIS — D631 Anemia in chronic kidney disease: Secondary | ICD-10-CM | POA: Diagnosis not present

## 2022-06-04 DIAGNOSIS — I429 Cardiomyopathy, unspecified: Secondary | ICD-10-CM | POA: Diagnosis not present

## 2022-06-04 DIAGNOSIS — N184 Chronic kidney disease, stage 4 (severe): Secondary | ICD-10-CM | POA: Diagnosis not present

## 2022-06-04 DIAGNOSIS — E1122 Type 2 diabetes mellitus with diabetic chronic kidney disease: Secondary | ICD-10-CM | POA: Diagnosis not present

## 2022-06-04 DIAGNOSIS — F32A Depression, unspecified: Secondary | ICD-10-CM | POA: Diagnosis not present

## 2022-06-04 DIAGNOSIS — Z8781 Personal history of (healed) traumatic fracture: Secondary | ICD-10-CM | POA: Diagnosis not present

## 2022-06-04 DIAGNOSIS — E7849 Other hyperlipidemia: Secondary | ICD-10-CM | POA: Diagnosis not present

## 2022-06-04 DIAGNOSIS — I130 Hypertensive heart and chronic kidney disease with heart failure and stage 1 through stage 4 chronic kidney disease, or unspecified chronic kidney disease: Secondary | ICD-10-CM | POA: Diagnosis not present

## 2022-06-04 DIAGNOSIS — E875 Hyperkalemia: Secondary | ICD-10-CM | POA: Diagnosis not present

## 2022-06-04 DIAGNOSIS — H9193 Unspecified hearing loss, bilateral: Secondary | ICD-10-CM | POA: Diagnosis not present

## 2022-06-04 DIAGNOSIS — Z87891 Personal history of nicotine dependence: Secondary | ICD-10-CM | POA: Diagnosis not present

## 2022-06-04 DIAGNOSIS — Z86010 Personal history of colonic polyps: Secondary | ICD-10-CM | POA: Diagnosis not present

## 2022-06-04 DIAGNOSIS — D8685 Sarcoid myocarditis: Secondary | ICD-10-CM | POA: Diagnosis not present

## 2022-06-04 DIAGNOSIS — S81011D Laceration without foreign body, right knee, subsequent encounter: Secondary | ICD-10-CM | POA: Diagnosis not present

## 2022-06-04 DIAGNOSIS — Z8673 Personal history of transient ischemic attack (TIA), and cerebral infarction without residual deficits: Secondary | ICD-10-CM | POA: Diagnosis not present

## 2022-06-04 DIAGNOSIS — I480 Paroxysmal atrial fibrillation: Secondary | ICD-10-CM | POA: Diagnosis not present

## 2022-06-04 DIAGNOSIS — Z86018 Personal history of other benign neoplasm: Secondary | ICD-10-CM | POA: Diagnosis not present

## 2022-06-04 DIAGNOSIS — Z7901 Long term (current) use of anticoagulants: Secondary | ICD-10-CM | POA: Diagnosis not present

## 2022-06-04 DIAGNOSIS — E1169 Type 2 diabetes mellitus with other specified complication: Secondary | ICD-10-CM | POA: Diagnosis not present

## 2022-06-04 DIAGNOSIS — I5022 Chronic systolic (congestive) heart failure: Secondary | ICD-10-CM | POA: Diagnosis not present

## 2022-06-04 DIAGNOSIS — Z7984 Long term (current) use of oral hypoglycemic drugs: Secondary | ICD-10-CM | POA: Diagnosis not present

## 2022-06-08 DIAGNOSIS — N184 Chronic kidney disease, stage 4 (severe): Secondary | ICD-10-CM | POA: Diagnosis not present

## 2022-06-08 DIAGNOSIS — D8685 Sarcoid myocarditis: Secondary | ICD-10-CM | POA: Diagnosis not present

## 2022-06-08 DIAGNOSIS — Z86018 Personal history of other benign neoplasm: Secondary | ICD-10-CM | POA: Diagnosis not present

## 2022-06-08 DIAGNOSIS — Z87891 Personal history of nicotine dependence: Secondary | ICD-10-CM | POA: Diagnosis not present

## 2022-06-08 DIAGNOSIS — Z8781 Personal history of (healed) traumatic fracture: Secondary | ICD-10-CM | POA: Diagnosis not present

## 2022-06-08 DIAGNOSIS — E1122 Type 2 diabetes mellitus with diabetic chronic kidney disease: Secondary | ICD-10-CM | POA: Diagnosis not present

## 2022-06-08 DIAGNOSIS — S81011D Laceration without foreign body, right knee, subsequent encounter: Secondary | ICD-10-CM | POA: Diagnosis not present

## 2022-06-08 DIAGNOSIS — I5022 Chronic systolic (congestive) heart failure: Secondary | ICD-10-CM | POA: Diagnosis not present

## 2022-06-08 DIAGNOSIS — I130 Hypertensive heart and chronic kidney disease with heart failure and stage 1 through stage 4 chronic kidney disease, or unspecified chronic kidney disease: Secondary | ICD-10-CM | POA: Diagnosis not present

## 2022-06-08 DIAGNOSIS — E875 Hyperkalemia: Secondary | ICD-10-CM | POA: Diagnosis not present

## 2022-06-08 DIAGNOSIS — E1169 Type 2 diabetes mellitus with other specified complication: Secondary | ICD-10-CM | POA: Diagnosis not present

## 2022-06-08 DIAGNOSIS — N400 Enlarged prostate without lower urinary tract symptoms: Secondary | ICD-10-CM | POA: Diagnosis not present

## 2022-06-08 DIAGNOSIS — D631 Anemia in chronic kidney disease: Secondary | ICD-10-CM | POA: Diagnosis not present

## 2022-06-08 DIAGNOSIS — F32A Depression, unspecified: Secondary | ICD-10-CM | POA: Diagnosis not present

## 2022-06-08 DIAGNOSIS — Z86010 Personal history of colonic polyps: Secondary | ICD-10-CM | POA: Diagnosis not present

## 2022-06-08 DIAGNOSIS — I480 Paroxysmal atrial fibrillation: Secondary | ICD-10-CM | POA: Diagnosis not present

## 2022-06-08 DIAGNOSIS — H9193 Unspecified hearing loss, bilateral: Secondary | ICD-10-CM | POA: Diagnosis not present

## 2022-06-08 DIAGNOSIS — S51011D Laceration without foreign body of right elbow, subsequent encounter: Secondary | ICD-10-CM | POA: Diagnosis not present

## 2022-06-08 DIAGNOSIS — Z7984 Long term (current) use of oral hypoglycemic drugs: Secondary | ICD-10-CM | POA: Diagnosis not present

## 2022-06-08 DIAGNOSIS — E7849 Other hyperlipidemia: Secondary | ICD-10-CM | POA: Diagnosis not present

## 2022-06-08 DIAGNOSIS — Z7901 Long term (current) use of anticoagulants: Secondary | ICD-10-CM | POA: Diagnosis not present

## 2022-06-08 DIAGNOSIS — Z8673 Personal history of transient ischemic attack (TIA), and cerebral infarction without residual deficits: Secondary | ICD-10-CM | POA: Diagnosis not present

## 2022-06-08 DIAGNOSIS — I429 Cardiomyopathy, unspecified: Secondary | ICD-10-CM | POA: Diagnosis not present

## 2022-06-09 DIAGNOSIS — I5022 Chronic systolic (congestive) heart failure: Secondary | ICD-10-CM | POA: Diagnosis not present

## 2022-06-09 DIAGNOSIS — Z7901 Long term (current) use of anticoagulants: Secondary | ICD-10-CM | POA: Diagnosis not present

## 2022-06-09 DIAGNOSIS — I429 Cardiomyopathy, unspecified: Secondary | ICD-10-CM | POA: Diagnosis not present

## 2022-06-09 DIAGNOSIS — Z8673 Personal history of transient ischemic attack (TIA), and cerebral infarction without residual deficits: Secondary | ICD-10-CM | POA: Diagnosis not present

## 2022-06-09 DIAGNOSIS — D8685 Sarcoid myocarditis: Secondary | ICD-10-CM | POA: Diagnosis not present

## 2022-06-09 DIAGNOSIS — Z7984 Long term (current) use of oral hypoglycemic drugs: Secondary | ICD-10-CM | POA: Diagnosis not present

## 2022-06-09 DIAGNOSIS — Z86018 Personal history of other benign neoplasm: Secondary | ICD-10-CM | POA: Diagnosis not present

## 2022-06-09 DIAGNOSIS — S51011D Laceration without foreign body of right elbow, subsequent encounter: Secondary | ICD-10-CM | POA: Diagnosis not present

## 2022-06-09 DIAGNOSIS — S81011D Laceration without foreign body, right knee, subsequent encounter: Secondary | ICD-10-CM | POA: Diagnosis not present

## 2022-06-09 DIAGNOSIS — E875 Hyperkalemia: Secondary | ICD-10-CM | POA: Diagnosis not present

## 2022-06-09 DIAGNOSIS — F32A Depression, unspecified: Secondary | ICD-10-CM | POA: Diagnosis not present

## 2022-06-09 DIAGNOSIS — H9193 Unspecified hearing loss, bilateral: Secondary | ICD-10-CM | POA: Diagnosis not present

## 2022-06-09 DIAGNOSIS — I480 Paroxysmal atrial fibrillation: Secondary | ICD-10-CM | POA: Diagnosis not present

## 2022-06-09 DIAGNOSIS — D631 Anemia in chronic kidney disease: Secondary | ICD-10-CM | POA: Diagnosis not present

## 2022-06-09 DIAGNOSIS — N184 Chronic kidney disease, stage 4 (severe): Secondary | ICD-10-CM | POA: Diagnosis not present

## 2022-06-09 DIAGNOSIS — E1122 Type 2 diabetes mellitus with diabetic chronic kidney disease: Secondary | ICD-10-CM | POA: Diagnosis not present

## 2022-06-09 DIAGNOSIS — I130 Hypertensive heart and chronic kidney disease with heart failure and stage 1 through stage 4 chronic kidney disease, or unspecified chronic kidney disease: Secondary | ICD-10-CM | POA: Diagnosis not present

## 2022-06-09 DIAGNOSIS — E1169 Type 2 diabetes mellitus with other specified complication: Secondary | ICD-10-CM | POA: Diagnosis not present

## 2022-06-09 DIAGNOSIS — N400 Enlarged prostate without lower urinary tract symptoms: Secondary | ICD-10-CM | POA: Diagnosis not present

## 2022-06-09 DIAGNOSIS — Z86010 Personal history of colonic polyps: Secondary | ICD-10-CM | POA: Diagnosis not present

## 2022-06-09 DIAGNOSIS — E7849 Other hyperlipidemia: Secondary | ICD-10-CM | POA: Diagnosis not present

## 2022-06-09 DIAGNOSIS — Z87891 Personal history of nicotine dependence: Secondary | ICD-10-CM | POA: Diagnosis not present

## 2022-06-09 DIAGNOSIS — Z8781 Personal history of (healed) traumatic fracture: Secondary | ICD-10-CM | POA: Diagnosis not present

## 2022-06-10 DIAGNOSIS — I480 Paroxysmal atrial fibrillation: Secondary | ICD-10-CM | POA: Diagnosis not present

## 2022-06-10 DIAGNOSIS — N400 Enlarged prostate without lower urinary tract symptoms: Secondary | ICD-10-CM | POA: Diagnosis not present

## 2022-06-10 DIAGNOSIS — E875 Hyperkalemia: Secondary | ICD-10-CM | POA: Diagnosis not present

## 2022-06-10 DIAGNOSIS — I5022 Chronic systolic (congestive) heart failure: Secondary | ICD-10-CM | POA: Diagnosis not present

## 2022-06-10 DIAGNOSIS — Z87891 Personal history of nicotine dependence: Secondary | ICD-10-CM | POA: Diagnosis not present

## 2022-06-10 DIAGNOSIS — N184 Chronic kidney disease, stage 4 (severe): Secondary | ICD-10-CM | POA: Diagnosis not present

## 2022-06-10 DIAGNOSIS — D631 Anemia in chronic kidney disease: Secondary | ICD-10-CM | POA: Diagnosis not present

## 2022-06-10 DIAGNOSIS — I429 Cardiomyopathy, unspecified: Secondary | ICD-10-CM | POA: Diagnosis not present

## 2022-06-10 DIAGNOSIS — Z86010 Personal history of colonic polyps: Secondary | ICD-10-CM | POA: Diagnosis not present

## 2022-06-10 DIAGNOSIS — D8685 Sarcoid myocarditis: Secondary | ICD-10-CM | POA: Diagnosis not present

## 2022-06-10 DIAGNOSIS — E1169 Type 2 diabetes mellitus with other specified complication: Secondary | ICD-10-CM | POA: Diagnosis not present

## 2022-06-10 DIAGNOSIS — F32A Depression, unspecified: Secondary | ICD-10-CM | POA: Diagnosis not present

## 2022-06-10 DIAGNOSIS — E7849 Other hyperlipidemia: Secondary | ICD-10-CM | POA: Diagnosis not present

## 2022-06-10 DIAGNOSIS — Z7984 Long term (current) use of oral hypoglycemic drugs: Secondary | ICD-10-CM | POA: Diagnosis not present

## 2022-06-10 DIAGNOSIS — Z8781 Personal history of (healed) traumatic fracture: Secondary | ICD-10-CM | POA: Diagnosis not present

## 2022-06-10 DIAGNOSIS — Z8673 Personal history of transient ischemic attack (TIA), and cerebral infarction without residual deficits: Secondary | ICD-10-CM | POA: Diagnosis not present

## 2022-06-10 DIAGNOSIS — Z86018 Personal history of other benign neoplasm: Secondary | ICD-10-CM | POA: Diagnosis not present

## 2022-06-10 DIAGNOSIS — H9193 Unspecified hearing loss, bilateral: Secondary | ICD-10-CM | POA: Diagnosis not present

## 2022-06-10 DIAGNOSIS — S51011D Laceration without foreign body of right elbow, subsequent encounter: Secondary | ICD-10-CM | POA: Diagnosis not present

## 2022-06-10 DIAGNOSIS — E1122 Type 2 diabetes mellitus with diabetic chronic kidney disease: Secondary | ICD-10-CM | POA: Diagnosis not present

## 2022-06-10 DIAGNOSIS — Z7901 Long term (current) use of anticoagulants: Secondary | ICD-10-CM | POA: Diagnosis not present

## 2022-06-10 DIAGNOSIS — I130 Hypertensive heart and chronic kidney disease with heart failure and stage 1 through stage 4 chronic kidney disease, or unspecified chronic kidney disease: Secondary | ICD-10-CM | POA: Diagnosis not present

## 2022-06-10 DIAGNOSIS — S81011D Laceration without foreign body, right knee, subsequent encounter: Secondary | ICD-10-CM | POA: Diagnosis not present

## 2022-06-14 DIAGNOSIS — N184 Chronic kidney disease, stage 4 (severe): Secondary | ICD-10-CM | POA: Diagnosis not present

## 2022-06-14 DIAGNOSIS — N2581 Secondary hyperparathyroidism of renal origin: Secondary | ICD-10-CM | POA: Diagnosis not present

## 2022-06-14 DIAGNOSIS — E1122 Type 2 diabetes mellitus with diabetic chronic kidney disease: Secondary | ICD-10-CM | POA: Diagnosis not present

## 2022-06-14 DIAGNOSIS — I10 Essential (primary) hypertension: Secondary | ICD-10-CM | POA: Diagnosis not present

## 2022-06-14 DIAGNOSIS — D631 Anemia in chronic kidney disease: Secondary | ICD-10-CM | POA: Diagnosis not present

## 2022-06-15 DIAGNOSIS — Z8673 Personal history of transient ischemic attack (TIA), and cerebral infarction without residual deficits: Secondary | ICD-10-CM | POA: Diagnosis not present

## 2022-06-15 DIAGNOSIS — Z8781 Personal history of (healed) traumatic fracture: Secondary | ICD-10-CM | POA: Diagnosis not present

## 2022-06-15 DIAGNOSIS — Z86010 Personal history of colonic polyps: Secondary | ICD-10-CM | POA: Diagnosis not present

## 2022-06-15 DIAGNOSIS — F32A Depression, unspecified: Secondary | ICD-10-CM | POA: Diagnosis not present

## 2022-06-15 DIAGNOSIS — Z7984 Long term (current) use of oral hypoglycemic drugs: Secondary | ICD-10-CM | POA: Diagnosis not present

## 2022-06-15 DIAGNOSIS — E875 Hyperkalemia: Secondary | ICD-10-CM | POA: Diagnosis not present

## 2022-06-15 DIAGNOSIS — H9193 Unspecified hearing loss, bilateral: Secondary | ICD-10-CM | POA: Diagnosis not present

## 2022-06-15 DIAGNOSIS — I429 Cardiomyopathy, unspecified: Secondary | ICD-10-CM | POA: Diagnosis not present

## 2022-06-15 DIAGNOSIS — E7849 Other hyperlipidemia: Secondary | ICD-10-CM | POA: Diagnosis not present

## 2022-06-15 DIAGNOSIS — S81011D Laceration without foreign body, right knee, subsequent encounter: Secondary | ICD-10-CM | POA: Diagnosis not present

## 2022-06-15 DIAGNOSIS — I480 Paroxysmal atrial fibrillation: Secondary | ICD-10-CM | POA: Diagnosis not present

## 2022-06-15 DIAGNOSIS — Z7901 Long term (current) use of anticoagulants: Secondary | ICD-10-CM | POA: Diagnosis not present

## 2022-06-15 DIAGNOSIS — E1122 Type 2 diabetes mellitus with diabetic chronic kidney disease: Secondary | ICD-10-CM | POA: Diagnosis not present

## 2022-06-15 DIAGNOSIS — S51011D Laceration without foreign body of right elbow, subsequent encounter: Secondary | ICD-10-CM | POA: Diagnosis not present

## 2022-06-15 DIAGNOSIS — Z86018 Personal history of other benign neoplasm: Secondary | ICD-10-CM | POA: Diagnosis not present

## 2022-06-15 DIAGNOSIS — D631 Anemia in chronic kidney disease: Secondary | ICD-10-CM | POA: Diagnosis not present

## 2022-06-15 DIAGNOSIS — Z87891 Personal history of nicotine dependence: Secondary | ICD-10-CM | POA: Diagnosis not present

## 2022-06-15 DIAGNOSIS — D8685 Sarcoid myocarditis: Secondary | ICD-10-CM | POA: Diagnosis not present

## 2022-06-15 DIAGNOSIS — I130 Hypertensive heart and chronic kidney disease with heart failure and stage 1 through stage 4 chronic kidney disease, or unspecified chronic kidney disease: Secondary | ICD-10-CM | POA: Diagnosis not present

## 2022-06-15 DIAGNOSIS — E1169 Type 2 diabetes mellitus with other specified complication: Secondary | ICD-10-CM | POA: Diagnosis not present

## 2022-06-15 DIAGNOSIS — N184 Chronic kidney disease, stage 4 (severe): Secondary | ICD-10-CM | POA: Diagnosis not present

## 2022-06-15 DIAGNOSIS — N400 Enlarged prostate without lower urinary tract symptoms: Secondary | ICD-10-CM | POA: Diagnosis not present

## 2022-06-15 DIAGNOSIS — I5022 Chronic systolic (congestive) heart failure: Secondary | ICD-10-CM | POA: Diagnosis not present

## 2022-06-17 ENCOUNTER — Other Ambulatory Visit: Payer: Self-pay | Admitting: Family Medicine

## 2022-06-17 ENCOUNTER — Ambulatory Visit: Payer: Self-pay | Admitting: *Deleted

## 2022-06-17 DIAGNOSIS — E1169 Type 2 diabetes mellitus with other specified complication: Secondary | ICD-10-CM | POA: Diagnosis not present

## 2022-06-17 DIAGNOSIS — E875 Hyperkalemia: Secondary | ICD-10-CM | POA: Diagnosis not present

## 2022-06-17 DIAGNOSIS — H9193 Unspecified hearing loss, bilateral: Secondary | ICD-10-CM | POA: Diagnosis not present

## 2022-06-17 DIAGNOSIS — D631 Anemia in chronic kidney disease: Secondary | ICD-10-CM | POA: Diagnosis not present

## 2022-06-17 DIAGNOSIS — I130 Hypertensive heart and chronic kidney disease with heart failure and stage 1 through stage 4 chronic kidney disease, or unspecified chronic kidney disease: Secondary | ICD-10-CM | POA: Diagnosis not present

## 2022-06-17 DIAGNOSIS — Z86010 Personal history of colonic polyps: Secondary | ICD-10-CM | POA: Diagnosis not present

## 2022-06-17 DIAGNOSIS — N184 Chronic kidney disease, stage 4 (severe): Secondary | ICD-10-CM | POA: Diagnosis not present

## 2022-06-17 DIAGNOSIS — S81011D Laceration without foreign body, right knee, subsequent encounter: Secondary | ICD-10-CM | POA: Diagnosis not present

## 2022-06-17 DIAGNOSIS — I429 Cardiomyopathy, unspecified: Secondary | ICD-10-CM | POA: Diagnosis not present

## 2022-06-17 DIAGNOSIS — I480 Paroxysmal atrial fibrillation: Secondary | ICD-10-CM | POA: Diagnosis not present

## 2022-06-17 DIAGNOSIS — Z8673 Personal history of transient ischemic attack (TIA), and cerebral infarction without residual deficits: Secondary | ICD-10-CM | POA: Diagnosis not present

## 2022-06-17 DIAGNOSIS — Z8781 Personal history of (healed) traumatic fracture: Secondary | ICD-10-CM | POA: Diagnosis not present

## 2022-06-17 DIAGNOSIS — Z7901 Long term (current) use of anticoagulants: Secondary | ICD-10-CM | POA: Diagnosis not present

## 2022-06-17 DIAGNOSIS — F32A Depression, unspecified: Secondary | ICD-10-CM | POA: Diagnosis not present

## 2022-06-17 DIAGNOSIS — N400 Enlarged prostate without lower urinary tract symptoms: Secondary | ICD-10-CM | POA: Diagnosis not present

## 2022-06-17 DIAGNOSIS — E7849 Other hyperlipidemia: Secondary | ICD-10-CM | POA: Diagnosis not present

## 2022-06-17 DIAGNOSIS — Z87891 Personal history of nicotine dependence: Secondary | ICD-10-CM | POA: Diagnosis not present

## 2022-06-17 DIAGNOSIS — D8685 Sarcoid myocarditis: Secondary | ICD-10-CM | POA: Diagnosis not present

## 2022-06-17 DIAGNOSIS — Z7984 Long term (current) use of oral hypoglycemic drugs: Secondary | ICD-10-CM | POA: Diagnosis not present

## 2022-06-17 DIAGNOSIS — I5022 Chronic systolic (congestive) heart failure: Secondary | ICD-10-CM | POA: Diagnosis not present

## 2022-06-17 DIAGNOSIS — S51011D Laceration without foreign body of right elbow, subsequent encounter: Secondary | ICD-10-CM | POA: Diagnosis not present

## 2022-06-17 DIAGNOSIS — E1122 Type 2 diabetes mellitus with diabetic chronic kidney disease: Secondary | ICD-10-CM | POA: Diagnosis not present

## 2022-06-17 DIAGNOSIS — Z86018 Personal history of other benign neoplasm: Secondary | ICD-10-CM | POA: Diagnosis not present

## 2022-06-17 NOTE — Patient Outreach (Signed)
  Care Coordination   Follow Up Visit Note   06/17/2022 Name: Cody Howard MRN: [id number] DOB: [birth year]  Cody Howard is a 88 y.o. year old male who sees Duanne Limerick, MD for primary care. I spoke with [name], wife of  Cody Howard by phone today.  What matters to the patients health and wellness today?  Per wife, ongoing involvement with home health team to increase strength and obtain wheelchair.     Goals Addressed             This Visit's Progress    No recurrent falls   On track    Care Coordination Interventions: Provided written and verbal education re: potential causes of falls and Fall prevention strategies Advised patient of importance of notifying provider of falls Assessed for falls since last encounter Advised patient to discuss Caregiver resources for patients with dementia with provider Discussed use of DME when going out for appointments and errands, does not have wheelchair but wife requesting help with getting one Discussed participation with home health PT/OT for balance and strength, has [facility], also has nurse coming in the home.  Reminded to use caution when changing positions and standing to decrease risk of syncope Hospital follow up with PCP on 3/25 completed, follow up with nephrology on 6/4, endocrinology on 7/19, and cardiology on 9/24         SDOH assessments and interventions completed:  No     Care Coordination Interventions:  Yes, provided   Interventions Today    Flowsheet Row Most Recent Value  Chronic Disease   Chronic disease during today's visit Other  [frequent falls]  General Interventions   General Interventions Discussed/Reviewed Doctor Visits, General Interventions Reviewed, Durable Medical Equipment (DME)  Doctor Visits Discussed/Reviewed Doctor Visits Reviewed, PCP, Specialist  Durable Medical Equipment (DME) Wheelchair  Wheelchair Standard  PCP/Specialist Visits Compliance with follow-up visit  Education  Interventions   Education Provided Provided Education  Provided Verbal Education On When to see the doctor  Safety Interventions   Safety Discussed/Reviewed Fall Risk, Safety Reviewed        Follow up plan: Follow up call scheduled for 4/26    Encounter Outcome:  Pt. Visit Completed   Kemper Durie, RN, MSN, [facility] Care Management Care Management Coordinator [phone]

## 2022-06-17 NOTE — Telephone Encounter (Signed)
Requested medications are due for refill today.  unsure  Requested medications are on the active medications list.  yes  Last refill. 05/24/2022  Future visit scheduled.   no  Notes to clinic.  Medication listed as historical.    Requested Prescriptions  Pending Prescriptions Disp Refills   finasteride (PROSCAR) 5 MG tablet [Pharmacy Med Name: FINASTERIDE TAB 5MG ] 30 tablet     Sig: TAKE 1 TABLET (5 MG TOTAL) BY MOUTH DAILY.     Urology: 5-alpha Reductase Inhibitors Failed - 06/17/2022 12:33 PM      Failed - PSA in normal range and within 360 days    Prostate Specific Ag, Serum  Date Value Ref Range Status  03/12/2019 0.5 0.0 - 4.0 ng/mL Final    Comment:    Roche ECLIA methodology. According to the American Urological Association, Serum PSA should decrease and remain at undetectable levels after radical prostatectomy. The AUA defines biochemical recurrence as an initial PSA value 0.2 ng/mL or greater followed by a subsequent confirmatory PSA value 0.2 ng/mL or greater. Values obtained with different assay methods or kits cannot be used interchangeably. Results cannot be interpreted as absolute evidence of the presence or absence of malignant disease.          Passed - Valid encounter within last 12 months    Recent Outpatient Visits           2 weeks ago Vasovagal syncope   [place] [facility], MD   8 months ago Cardiomyopathy, unspecified type [facility])   [place] [facility], MD   1 year ago Essential hypertension   [place] [facility], MD   1 year ago Type 2 diabetes mellitus without complication, without long-term current use of insulin (HCC)   [place] [facility], MD   1 year ago Acute rhinosinusitis   [place] [facility] at [facility], Ocie Bob, MD

## 2022-06-22 DIAGNOSIS — Z86018 Personal history of other benign neoplasm: Secondary | ICD-10-CM | POA: Diagnosis not present

## 2022-06-22 DIAGNOSIS — I429 Cardiomyopathy, unspecified: Secondary | ICD-10-CM | POA: Diagnosis not present

## 2022-06-22 DIAGNOSIS — Z7901 Long term (current) use of anticoagulants: Secondary | ICD-10-CM | POA: Diagnosis not present

## 2022-06-22 DIAGNOSIS — D631 Anemia in chronic kidney disease: Secondary | ICD-10-CM | POA: Diagnosis not present

## 2022-06-22 DIAGNOSIS — S51011D Laceration without foreign body of right elbow, subsequent encounter: Secondary | ICD-10-CM | POA: Diagnosis not present

## 2022-06-22 DIAGNOSIS — Z86010 Personal history of colonic polyps: Secondary | ICD-10-CM | POA: Diagnosis not present

## 2022-06-22 DIAGNOSIS — I480 Paroxysmal atrial fibrillation: Secondary | ICD-10-CM | POA: Diagnosis not present

## 2022-06-22 DIAGNOSIS — Z8781 Personal history of (healed) traumatic fracture: Secondary | ICD-10-CM | POA: Diagnosis not present

## 2022-06-22 DIAGNOSIS — Z7984 Long term (current) use of oral hypoglycemic drugs: Secondary | ICD-10-CM | POA: Diagnosis not present

## 2022-06-22 DIAGNOSIS — F32A Depression, unspecified: Secondary | ICD-10-CM | POA: Diagnosis not present

## 2022-06-22 DIAGNOSIS — I5022 Chronic systolic (congestive) heart failure: Secondary | ICD-10-CM | POA: Diagnosis not present

## 2022-06-22 DIAGNOSIS — H9193 Unspecified hearing loss, bilateral: Secondary | ICD-10-CM | POA: Diagnosis not present

## 2022-06-22 DIAGNOSIS — N184 Chronic kidney disease, stage 4 (severe): Secondary | ICD-10-CM | POA: Diagnosis not present

## 2022-06-22 DIAGNOSIS — Z87891 Personal history of nicotine dependence: Secondary | ICD-10-CM | POA: Diagnosis not present

## 2022-06-22 DIAGNOSIS — N400 Enlarged prostate without lower urinary tract symptoms: Secondary | ICD-10-CM | POA: Diagnosis not present

## 2022-06-22 DIAGNOSIS — E1169 Type 2 diabetes mellitus with other specified complication: Secondary | ICD-10-CM | POA: Diagnosis not present

## 2022-06-22 DIAGNOSIS — D8685 Sarcoid myocarditis: Secondary | ICD-10-CM | POA: Diagnosis not present

## 2022-06-22 DIAGNOSIS — E875 Hyperkalemia: Secondary | ICD-10-CM | POA: Diagnosis not present

## 2022-06-22 DIAGNOSIS — E7849 Other hyperlipidemia: Secondary | ICD-10-CM | POA: Diagnosis not present

## 2022-06-22 DIAGNOSIS — I130 Hypertensive heart and chronic kidney disease with heart failure and stage 1 through stage 4 chronic kidney disease, or unspecified chronic kidney disease: Secondary | ICD-10-CM | POA: Diagnosis not present

## 2022-06-22 DIAGNOSIS — Z8673 Personal history of transient ischemic attack (TIA), and cerebral infarction without residual deficits: Secondary | ICD-10-CM | POA: Diagnosis not present

## 2022-06-22 DIAGNOSIS — S81011D Laceration without foreign body, right knee, subsequent encounter: Secondary | ICD-10-CM | POA: Diagnosis not present

## 2022-06-22 DIAGNOSIS — E1122 Type 2 diabetes mellitus with diabetic chronic kidney disease: Secondary | ICD-10-CM | POA: Diagnosis not present

## 2022-06-24 ENCOUNTER — Other Ambulatory Visit: Payer: Self-pay | Admitting: Family Medicine

## 2022-06-24 DIAGNOSIS — Z86018 Personal history of other benign neoplasm: Secondary | ICD-10-CM | POA: Diagnosis not present

## 2022-06-24 DIAGNOSIS — I130 Hypertensive heart and chronic kidney disease with heart failure and stage 1 through stage 4 chronic kidney disease, or unspecified chronic kidney disease: Secondary | ICD-10-CM | POA: Diagnosis not present

## 2022-06-24 DIAGNOSIS — E1122 Type 2 diabetes mellitus with diabetic chronic kidney disease: Secondary | ICD-10-CM | POA: Diagnosis not present

## 2022-06-24 DIAGNOSIS — H9193 Unspecified hearing loss, bilateral: Secondary | ICD-10-CM | POA: Diagnosis not present

## 2022-06-24 DIAGNOSIS — N184 Chronic kidney disease, stage 4 (severe): Secondary | ICD-10-CM | POA: Diagnosis not present

## 2022-06-24 DIAGNOSIS — D8685 Sarcoid myocarditis: Secondary | ICD-10-CM | POA: Diagnosis not present

## 2022-06-24 DIAGNOSIS — Z86010 Personal history of colonic polyps: Secondary | ICD-10-CM | POA: Diagnosis not present

## 2022-06-24 DIAGNOSIS — I480 Paroxysmal atrial fibrillation: Secondary | ICD-10-CM | POA: Diagnosis not present

## 2022-06-24 DIAGNOSIS — Z7901 Long term (current) use of anticoagulants: Secondary | ICD-10-CM | POA: Diagnosis not present

## 2022-06-24 DIAGNOSIS — F32A Depression, unspecified: Secondary | ICD-10-CM | POA: Diagnosis not present

## 2022-06-24 DIAGNOSIS — I429 Cardiomyopathy, unspecified: Secondary | ICD-10-CM | POA: Diagnosis not present

## 2022-06-24 DIAGNOSIS — E875 Hyperkalemia: Secondary | ICD-10-CM | POA: Diagnosis not present

## 2022-06-24 DIAGNOSIS — S51011D Laceration without foreign body of right elbow, subsequent encounter: Secondary | ICD-10-CM | POA: Diagnosis not present

## 2022-06-24 DIAGNOSIS — E1169 Type 2 diabetes mellitus with other specified complication: Secondary | ICD-10-CM | POA: Diagnosis not present

## 2022-06-24 DIAGNOSIS — Z7984 Long term (current) use of oral hypoglycemic drugs: Secondary | ICD-10-CM | POA: Diagnosis not present

## 2022-06-24 DIAGNOSIS — S81011D Laceration without foreign body, right knee, subsequent encounter: Secondary | ICD-10-CM | POA: Diagnosis not present

## 2022-06-24 DIAGNOSIS — I5022 Chronic systolic (congestive) heart failure: Secondary | ICD-10-CM | POA: Diagnosis not present

## 2022-06-24 DIAGNOSIS — Z87891 Personal history of nicotine dependence: Secondary | ICD-10-CM | POA: Diagnosis not present

## 2022-06-24 DIAGNOSIS — Z8781 Personal history of (healed) traumatic fracture: Secondary | ICD-10-CM | POA: Diagnosis not present

## 2022-06-24 DIAGNOSIS — Z8673 Personal history of transient ischemic attack (TIA), and cerebral infarction without residual deficits: Secondary | ICD-10-CM | POA: Diagnosis not present

## 2022-06-24 DIAGNOSIS — N400 Enlarged prostate without lower urinary tract symptoms: Secondary | ICD-10-CM | POA: Diagnosis not present

## 2022-06-24 DIAGNOSIS — E7849 Other hyperlipidemia: Secondary | ICD-10-CM | POA: Diagnosis not present

## 2022-06-24 DIAGNOSIS — D631 Anemia in chronic kidney disease: Secondary | ICD-10-CM | POA: Diagnosis not present

## 2022-06-25 ENCOUNTER — Other Ambulatory Visit: Payer: Self-pay | Admitting: Family Medicine

## 2022-06-25 NOTE — Telephone Encounter (Signed)
Requested medication (s) are due for refill today: review  Requested medication (s) are on the active medication list: no  Last refill:  05/24/22  Future visit scheduled: no  Notes to clinic:  not on med list and aint sure who prescribed d/t not listed on protocol. Has been refused this week.      Requested Prescriptions  Pending Prescriptions Disp Refills   finasteride (PROSCAR) 5 MG tablet [Pharmacy Med Name: FINASTERIDE TAB ] 30 tablet     Sig: TAKE 1 TABLET (5 MG TOTAL) BY MOUTH DAILY.     Urology: 5-alpha Reductase Inhibitors Failed - 06/25/2022 11:59 AM      Failed - PSA in normal range and within 360 days    Prostate Specific Ag, Serum  Date Value Ref Range Status  03/12/2019 0.5 0.0 - 4.0 ng/mL Final    Comment:    Roche ECLIA methodology. According to the American Urological Association, Serum PSA should decrease and remain at undetectable levels after radical prostatectomy. The AUA defines biochemical recurrence as an initial PSA value 0.2 ng/mL or greater followed by a subsequent confirmatory PSA value 0.2 ng/mL or greater. Values obtained with different assay methods or kits cannot be used interchangeably. Results cannot be interpreted as absolute evidence of the presence or absence of malignant disease.          Passed - Valid encounter within last 12 months    Recent Outpatient Visits           3 weeks ago Vasovagal syncope   [facility], [facility]   8 months ago Cardiomyopathy, unspecified type [facility])   [place] [facility]   1 year ago Essential hypertension   [place] [facility], [facility]   1 year ago Type 2 diabetes mellitus without complication, without long-term current use of insulin (HCC)   [place] [facility], [facility]   1  year ago Acute rhinosinusitis   [place] [facility], Ocie Bob, MD

## 2022-06-30 DIAGNOSIS — I5022 Chronic systolic (congestive) heart failure: Secondary | ICD-10-CM | POA: Diagnosis not present

## 2022-06-30 DIAGNOSIS — E1122 Type 2 diabetes mellitus with diabetic chronic kidney disease: Secondary | ICD-10-CM | POA: Diagnosis not present

## 2022-06-30 DIAGNOSIS — E7849 Other hyperlipidemia: Secondary | ICD-10-CM | POA: Diagnosis not present

## 2022-06-30 DIAGNOSIS — Z7984 Long term (current) use of oral hypoglycemic drugs: Secondary | ICD-10-CM | POA: Diagnosis not present

## 2022-06-30 DIAGNOSIS — D631 Anemia in chronic kidney disease: Secondary | ICD-10-CM | POA: Diagnosis not present

## 2022-06-30 DIAGNOSIS — H9193 Unspecified hearing loss, bilateral: Secondary | ICD-10-CM | POA: Diagnosis not present

## 2022-06-30 DIAGNOSIS — E1169 Type 2 diabetes mellitus with other specified complication: Secondary | ICD-10-CM | POA: Diagnosis not present

## 2022-06-30 DIAGNOSIS — D8685 Sarcoid myocarditis: Secondary | ICD-10-CM | POA: Diagnosis not present

## 2022-06-30 DIAGNOSIS — Z86010 Personal history of colonic polyps: Secondary | ICD-10-CM | POA: Diagnosis not present

## 2022-06-30 DIAGNOSIS — Z8673 Personal history of transient ischemic attack (TIA), and cerebral infarction without residual deficits: Secondary | ICD-10-CM | POA: Diagnosis not present

## 2022-06-30 DIAGNOSIS — N184 Chronic kidney disease, stage 4 (severe): Secondary | ICD-10-CM | POA: Diagnosis not present

## 2022-06-30 DIAGNOSIS — Z7901 Long term (current) use of anticoagulants: Secondary | ICD-10-CM | POA: Diagnosis not present

## 2022-06-30 DIAGNOSIS — S51011D Laceration without foreign body of right elbow, subsequent encounter: Secondary | ICD-10-CM | POA: Diagnosis not present

## 2022-06-30 DIAGNOSIS — I480 Paroxysmal atrial fibrillation: Secondary | ICD-10-CM | POA: Diagnosis not present

## 2022-06-30 DIAGNOSIS — I130 Hypertensive heart and chronic kidney disease with heart failure and stage 1 through stage 4 chronic kidney disease, or unspecified chronic kidney disease: Secondary | ICD-10-CM | POA: Diagnosis not present

## 2022-06-30 DIAGNOSIS — E875 Hyperkalemia: Secondary | ICD-10-CM | POA: Diagnosis not present

## 2022-06-30 DIAGNOSIS — S81011D Laceration without foreign body, right knee, subsequent encounter: Secondary | ICD-10-CM | POA: Diagnosis not present

## 2022-06-30 DIAGNOSIS — F32A Depression, unspecified: Secondary | ICD-10-CM | POA: Diagnosis not present

## 2022-06-30 DIAGNOSIS — Z86018 Personal history of other benign neoplasm: Secondary | ICD-10-CM | POA: Diagnosis not present

## 2022-06-30 DIAGNOSIS — Z87891 Personal history of nicotine dependence: Secondary | ICD-10-CM | POA: Diagnosis not present

## 2022-06-30 DIAGNOSIS — Z8781 Personal history of (healed) traumatic fracture: Secondary | ICD-10-CM | POA: Diagnosis not present

## 2022-06-30 DIAGNOSIS — I429 Cardiomyopathy, unspecified: Secondary | ICD-10-CM | POA: Diagnosis not present

## 2022-06-30 DIAGNOSIS — N400 Enlarged prostate without lower urinary tract symptoms: Secondary | ICD-10-CM | POA: Diagnosis not present

## 2022-07-01 ENCOUNTER — Other Ambulatory Visit: Payer: Self-pay | Admitting: Family Medicine

## 2022-07-01 DIAGNOSIS — I130 Hypertensive heart and chronic kidney disease with heart failure and stage 1 through stage 4 chronic kidney disease, or unspecified chronic kidney disease: Secondary | ICD-10-CM | POA: Diagnosis not present

## 2022-07-01 DIAGNOSIS — Z8673 Personal history of transient ischemic attack (TIA), and cerebral infarction without residual deficits: Secondary | ICD-10-CM | POA: Diagnosis not present

## 2022-07-01 DIAGNOSIS — E1169 Type 2 diabetes mellitus with other specified complication: Secondary | ICD-10-CM | POA: Diagnosis not present

## 2022-07-01 DIAGNOSIS — Z87891 Personal history of nicotine dependence: Secondary | ICD-10-CM | POA: Diagnosis not present

## 2022-07-01 DIAGNOSIS — Z7901 Long term (current) use of anticoagulants: Secondary | ICD-10-CM | POA: Diagnosis not present

## 2022-07-01 DIAGNOSIS — F329 Major depressive disorder, single episode, unspecified: Secondary | ICD-10-CM

## 2022-07-01 DIAGNOSIS — E7849 Other hyperlipidemia: Secondary | ICD-10-CM | POA: Diagnosis not present

## 2022-07-01 DIAGNOSIS — N184 Chronic kidney disease, stage 4 (severe): Secondary | ICD-10-CM | POA: Diagnosis not present

## 2022-07-01 DIAGNOSIS — S81011D Laceration without foreign body, right knee, subsequent encounter: Secondary | ICD-10-CM | POA: Diagnosis not present

## 2022-07-01 DIAGNOSIS — E875 Hyperkalemia: Secondary | ICD-10-CM | POA: Diagnosis not present

## 2022-07-01 DIAGNOSIS — Z7984 Long term (current) use of oral hypoglycemic drugs: Secondary | ICD-10-CM | POA: Diagnosis not present

## 2022-07-01 DIAGNOSIS — D631 Anemia in chronic kidney disease: Secondary | ICD-10-CM | POA: Diagnosis not present

## 2022-07-01 DIAGNOSIS — I480 Paroxysmal atrial fibrillation: Secondary | ICD-10-CM | POA: Diagnosis not present

## 2022-07-01 DIAGNOSIS — I429 Cardiomyopathy, unspecified: Secondary | ICD-10-CM | POA: Diagnosis not present

## 2022-07-01 DIAGNOSIS — Z86018 Personal history of other benign neoplasm: Secondary | ICD-10-CM | POA: Diagnosis not present

## 2022-07-01 DIAGNOSIS — F32A Depression, unspecified: Secondary | ICD-10-CM | POA: Diagnosis not present

## 2022-07-01 DIAGNOSIS — S51011D Laceration without foreign body of right elbow, subsequent encounter: Secondary | ICD-10-CM | POA: Diagnosis not present

## 2022-07-01 DIAGNOSIS — I5022 Chronic systolic (congestive) heart failure: Secondary | ICD-10-CM | POA: Diagnosis not present

## 2022-07-01 DIAGNOSIS — Z86010 Personal history of colonic polyps: Secondary | ICD-10-CM | POA: Diagnosis not present

## 2022-07-01 DIAGNOSIS — E1122 Type 2 diabetes mellitus with diabetic chronic kidney disease: Secondary | ICD-10-CM | POA: Diagnosis not present

## 2022-07-01 DIAGNOSIS — N400 Enlarged prostate without lower urinary tract symptoms: Secondary | ICD-10-CM | POA: Diagnosis not present

## 2022-07-01 DIAGNOSIS — D8685 Sarcoid myocarditis: Secondary | ICD-10-CM | POA: Diagnosis not present

## 2022-07-01 DIAGNOSIS — H9193 Unspecified hearing loss, bilateral: Secondary | ICD-10-CM | POA: Diagnosis not present

## 2022-07-01 DIAGNOSIS — Z8781 Personal history of (healed) traumatic fracture: Secondary | ICD-10-CM | POA: Diagnosis not present

## 2022-07-02 ENCOUNTER — Ambulatory Visit: Payer: Self-pay | Admitting: *Deleted

## 2022-07-02 NOTE — Patient Outreach (Signed)
  Care Coordination   Follow Up Visit Note   07/02/2022 Name: Cody Howard MRN: [id number] DOB: [birth year]  Cody Howard is a 88 y.o. year old male who sees Duanne Limerick, MD for primary care. I spoke with Cody Howard, daughter of Cody Howard by phone today.  What matters to the patients health and wellness today?  Per wife, concern remains to obtain wheelchair. Request will be sent to PCP again.    Goals Addressed             This Visit's Progress    No recurrent falls   On track    Care Coordination Interventions: Provided written and verbal education re: potential causes of falls and Fall prevention strategies Advised patient of importance of notifying provider of falls Assessed for falls since last encounter Advised patient to discuss Caregiver resources for patients with dementia with provider Discussed use of DME when going out for appointments and errands, does not have wheelchair but wife requesting help with getting one, no order from PCP yet, will reach out again Discussed participation with home health PT/OT for balance and strength, has [facility], also has nurse coming in the home.  Reminded to use caution when changing positions and standing to decrease risk of syncope  follow up with nephrology on 6/4, endocrinology on 7/19, and cardiology on 9/24         SDOH assessments and interventions completed:  No     Care Coordination Interventions:  Yes, provided  Interventions Today    Flowsheet Row Most Recent Value  Chronic Disease   Chronic disease during today's visit Other  [Fall]  General Interventions   General Interventions Discussed/Reviewed Doctor Visits, Communication with, General Interventions Reviewed, Durable Medical Equipment (DME)  Doctor Visits Discussed/Reviewed Doctor Visits Reviewed  Durable Medical Equipment (DME) Wheelchair  Wheelchair Standard  Communication with PCP/Specialists  [Request sent to PCP office requesting order for  wheelchair]        Follow up plan: Follow up call scheduled for 5/8    Encounter Outcome:  Pt. Visit Completed   Kemper Durie, RN, MSN, [facility] Care Management Care Management Coordinator [phone]

## 2022-07-06 DIAGNOSIS — Z86018 Personal history of other benign neoplasm: Secondary | ICD-10-CM | POA: Diagnosis not present

## 2022-07-06 DIAGNOSIS — Z87891 Personal history of nicotine dependence: Secondary | ICD-10-CM | POA: Diagnosis not present

## 2022-07-06 DIAGNOSIS — D8685 Sarcoid myocarditis: Secondary | ICD-10-CM | POA: Diagnosis not present

## 2022-07-06 DIAGNOSIS — I5022 Chronic systolic (congestive) heart failure: Secondary | ICD-10-CM | POA: Diagnosis not present

## 2022-07-06 DIAGNOSIS — I130 Hypertensive heart and chronic kidney disease with heart failure and stage 1 through stage 4 chronic kidney disease, or unspecified chronic kidney disease: Secondary | ICD-10-CM | POA: Diagnosis not present

## 2022-07-06 DIAGNOSIS — Z8673 Personal history of transient ischemic attack (TIA), and cerebral infarction without residual deficits: Secondary | ICD-10-CM | POA: Diagnosis not present

## 2022-07-06 DIAGNOSIS — Z8781 Personal history of (healed) traumatic fracture: Secondary | ICD-10-CM | POA: Diagnosis not present

## 2022-07-06 DIAGNOSIS — Z86010 Personal history of colonic polyps: Secondary | ICD-10-CM | POA: Diagnosis not present

## 2022-07-06 DIAGNOSIS — H9193 Unspecified hearing loss, bilateral: Secondary | ICD-10-CM | POA: Diagnosis not present

## 2022-07-06 DIAGNOSIS — E1169 Type 2 diabetes mellitus with other specified complication: Secondary | ICD-10-CM | POA: Diagnosis not present

## 2022-07-06 DIAGNOSIS — D631 Anemia in chronic kidney disease: Secondary | ICD-10-CM | POA: Diagnosis not present

## 2022-07-06 DIAGNOSIS — E1122 Type 2 diabetes mellitus with diabetic chronic kidney disease: Secondary | ICD-10-CM | POA: Diagnosis not present

## 2022-07-06 DIAGNOSIS — F32A Depression, unspecified: Secondary | ICD-10-CM | POA: Diagnosis not present

## 2022-07-06 DIAGNOSIS — E875 Hyperkalemia: Secondary | ICD-10-CM | POA: Diagnosis not present

## 2022-07-06 DIAGNOSIS — S51011D Laceration without foreign body of right elbow, subsequent encounter: Secondary | ICD-10-CM | POA: Diagnosis not present

## 2022-07-06 DIAGNOSIS — N400 Enlarged prostate without lower urinary tract symptoms: Secondary | ICD-10-CM | POA: Diagnosis not present

## 2022-07-06 DIAGNOSIS — N184 Chronic kidney disease, stage 4 (severe): Secondary | ICD-10-CM | POA: Diagnosis not present

## 2022-07-06 DIAGNOSIS — E7849 Other hyperlipidemia: Secondary | ICD-10-CM | POA: Diagnosis not present

## 2022-07-06 DIAGNOSIS — I429 Cardiomyopathy, unspecified: Secondary | ICD-10-CM | POA: Diagnosis not present

## 2022-07-06 DIAGNOSIS — I480 Paroxysmal atrial fibrillation: Secondary | ICD-10-CM | POA: Diagnosis not present

## 2022-07-06 DIAGNOSIS — S81011D Laceration without foreign body, right knee, subsequent encounter: Secondary | ICD-10-CM | POA: Diagnosis not present

## 2022-07-06 DIAGNOSIS — Z7984 Long term (current) use of oral hypoglycemic drugs: Secondary | ICD-10-CM | POA: Diagnosis not present

## 2022-07-06 DIAGNOSIS — Z7901 Long term (current) use of anticoagulants: Secondary | ICD-10-CM | POA: Diagnosis not present

## 2022-07-07 ENCOUNTER — Other Ambulatory Visit
Admission: RE | Admit: 2022-07-07 | Discharge: 2022-07-07 | Disposition: A | Discharge status: Home / Self Care | Payer: Medicare HMO | Attending: Urology | Admitting: Urology

## 2022-07-07 ENCOUNTER — Other Ambulatory Visit: Payer: Self-pay

## 2022-07-07 ENCOUNTER — Encounter: Payer: Self-pay | Admitting: Urology

## 2022-07-07 ENCOUNTER — Ambulatory Visit: Payer: Medicare HMO | Admitting: Urology

## 2022-07-07 VITALS — BP 139/67 | HR 69 | Ht 68.0 in | Wt 191.0 lb

## 2022-07-07 DIAGNOSIS — N401 Enlarged prostate with lower urinary tract symptoms: Secondary | ICD-10-CM | POA: Insufficient documentation

## 2022-07-07 DIAGNOSIS — R3121 Asymptomatic microscopic hematuria: Secondary | ICD-10-CM | POA: Diagnosis not present

## 2022-07-07 DIAGNOSIS — R350 Frequency of micturition: Secondary | ICD-10-CM | POA: Diagnosis not present

## 2022-07-07 LAB — URINALYSIS, COMPLETE (UACMP) WITH MICROSCOPIC
Bilirubin Urine: NEGATIVE
Glucose, UA: 100 mg/dL — AB
Ketones, ur: NEGATIVE mg/dL
Leukocytes,Ua: NEGATIVE
Nitrite: NEGATIVE
Protein, ur: >300 mg/dL — AB
RBC / HPF: NONE SEEN RBC/hpf (ref 0–5)
Specific Gravity, Urine: >1.03 — ABNORMAL HIGH (ref 1.005–1.030)
pH: 5.5 (ref 5.0–8.0)

## 2022-07-07 LAB — BLADDER SCAN AMB NON-IMAGING

## 2022-07-07 NOTE — Progress Notes (Signed)
07/07/22 1:31 PM   Caro Laroche Gardner Candle Aug 01, 1934 [id number]  CC: microscopic hematuria  HPI: I saw Mr. Buckbee and his family today for evaluation of microscopic hematuria.  He has a single episode of microscopic hematuria on 05/22/2022 with 37 RBCs, this was checked at [facility] when he was admitted after a fall.  Urinalysis today is benign.  He denies any gross hematuria, dysuria, or urinary symptoms.  He has some mild urinary frequency that is exacerbated by his overall frailty and difficulty getting out of the wheelchair to the bathroom.  His family has a bedside commode now which has been helpful.  He denies any urinary symptoms.  He was started on finasteride at discharge for unclear reasons.  He previously was on Flomax in the distant past which caused lightheadedness and was discontinued.    Overall really denies any urinary complaints today, PVR normal at 25ml.  Urinalysis completely benign.   PMH: Past Medical History:  Diagnosis Date   AF (atrial fibrillation) (HCC)    Arthritis    hands   Chronic kidney disease    Diabetes mellitus without complication (HCC)    Hyperlipidemia    Hypertension    Prostate enlargement     Surgical History: Past Surgical History:  Procedure Laterality Date   COLONOSCOPY N/A 02/26/2015   Procedure: COLONOSCOPY;  Surgeon: Wallace Cullens, MD;  Location: [facility];  Service: Gastroenterology;  Laterality: N/A;   COLONOSCOPY WITH PROPOFOL N/A 11/02/2017   Procedure: COLONOSCOPY WITH PROPOFOL;  Surgeon: Toledo, Boykin Nearing, MD;  Location: [facility];  Service: Gastroenterology;  Laterality: N/A;   ESOPHAGOGASTRODUODENOSCOPY N/A 02/26/2015   Procedure: ESOPHAGOGASTRODUODENOSCOPY (EGD);  Surgeon: Wallace Cullens, MD;  Location: [facility];  Service: Gastroenterology;  Laterality: N/A;  Diabetic - oral meds   ESOPHAGOGASTRODUODENOSCOPY (EGD) WITH PROPOFOL N/A 11/02/2017   Procedure: ESOPHAGOGASTRODUODENOSCOPY (EGD) WITH PROPOFOL;   Surgeon: Toledo, Boykin Nearing, MD;  Location: [facility] END[facility];  Service: Gastroenterology;  Laterality: N/A;   HIP ARTHROPLASTY Left 06/28/2019   Procedure: ARTHROPLASTY BIPOLAR HIP (HEMIARTHROPLASTY);  Surgeon: Christena Flake, MD;  Location: [facility] ORS;  Service: Orthopedics;  Laterality: Left;   KIDNEY SURGERY Right    surgery when 88 years old   POLYPECTOMY  02/26/2015   Procedure: POLYPECTOMY;  Surgeon: Wallace Cullens, MD;  Location: [facility];  Service: Gastroenterology;;   sebaceous cyst removal Right    located right of spine on upper back      Family History: Family History  Problem Relation Age of Onset   Diabetes Father    Heart disease Father     Social History:  reports that he quit smoking about 44 years ago. His smoking use included cigarettes. He has never used smokeless tobacco. He reports that he does not currently use alcohol. He reports that he does not use drugs.  Physical Exam: BP 139/67 (BP Location: Left Arm, Patient Position: Sitting, Cuff Size: Normal)   Pulse 69   Ht 5\' 8"  (1.727 m)   Wt 191 lb (86.6 kg)   BMI 29.04 kg/m    Constitutional: Frail-appearing, in wheelchair Cardiovascular: No clubbing, cyanosis, or edema. Respiratory: Normal respiratory effort, no increased work of breathing. GI: Abdomen is soft, nontender, nondistended, no abdominal masses   Laboratory Data: Reviewed, see HPI  Assessment & Plan:   Frail 88 year old male with a number of comorbidities referred for 1 episode of microscopic hematuria at the time of a fall that required hospitalization.  Repeat urinalysis today  is benign.  He denies any urinary symptoms, and PVR is normal today at 25 mL.  Reassurance was provided.  I do not see any indication for continuing finasteride.  Return precautions were discussed including dysuria, worsening urinary symptoms, or gross hematuria.  I think we can defer further evaluation with CT or cystoscopy in the setting of a single episode of  microscopic hematuria at the time of the fall with a repeat UA that was benign.  Okay to discontinue finasteride Follow-up with urology as needed  Legrand Rams, MD 07/07/2022  [facility] [address], [address] [place], [place] [address] [phone]

## 2022-07-08 DIAGNOSIS — Z8781 Personal history of (healed) traumatic fracture: Secondary | ICD-10-CM | POA: Diagnosis not present

## 2022-07-08 DIAGNOSIS — Z7984 Long term (current) use of oral hypoglycemic drugs: Secondary | ICD-10-CM | POA: Diagnosis not present

## 2022-07-08 DIAGNOSIS — N184 Chronic kidney disease, stage 4 (severe): Secondary | ICD-10-CM | POA: Diagnosis not present

## 2022-07-08 DIAGNOSIS — I429 Cardiomyopathy, unspecified: Secondary | ICD-10-CM | POA: Diagnosis not present

## 2022-07-08 DIAGNOSIS — I130 Hypertensive heart and chronic kidney disease with heart failure and stage 1 through stage 4 chronic kidney disease, or unspecified chronic kidney disease: Secondary | ICD-10-CM | POA: Diagnosis not present

## 2022-07-08 DIAGNOSIS — I5022 Chronic systolic (congestive) heart failure: Secondary | ICD-10-CM | POA: Diagnosis not present

## 2022-07-08 DIAGNOSIS — E1169 Type 2 diabetes mellitus with other specified complication: Secondary | ICD-10-CM | POA: Diagnosis not present

## 2022-07-08 DIAGNOSIS — Z86010 Personal history of colonic polyps: Secondary | ICD-10-CM | POA: Diagnosis not present

## 2022-07-08 DIAGNOSIS — S81011D Laceration without foreign body, right knee, subsequent encounter: Secondary | ICD-10-CM | POA: Diagnosis not present

## 2022-07-08 DIAGNOSIS — D631 Anemia in chronic kidney disease: Secondary | ICD-10-CM | POA: Diagnosis not present

## 2022-07-08 DIAGNOSIS — E875 Hyperkalemia: Secondary | ICD-10-CM | POA: Diagnosis not present

## 2022-07-08 DIAGNOSIS — D8685 Sarcoid myocarditis: Secondary | ICD-10-CM | POA: Diagnosis not present

## 2022-07-08 DIAGNOSIS — Z8673 Personal history of transient ischemic attack (TIA), and cerebral infarction without residual deficits: Secondary | ICD-10-CM | POA: Diagnosis not present

## 2022-07-08 DIAGNOSIS — N400 Enlarged prostate without lower urinary tract symptoms: Secondary | ICD-10-CM | POA: Diagnosis not present

## 2022-07-08 DIAGNOSIS — I480 Paroxysmal atrial fibrillation: Secondary | ICD-10-CM | POA: Diagnosis not present

## 2022-07-08 DIAGNOSIS — Z7901 Long term (current) use of anticoagulants: Secondary | ICD-10-CM | POA: Diagnosis not present

## 2022-07-08 DIAGNOSIS — E7849 Other hyperlipidemia: Secondary | ICD-10-CM | POA: Diagnosis not present

## 2022-07-08 DIAGNOSIS — Z86018 Personal history of other benign neoplasm: Secondary | ICD-10-CM | POA: Diagnosis not present

## 2022-07-08 DIAGNOSIS — F32A Depression, unspecified: Secondary | ICD-10-CM | POA: Diagnosis not present

## 2022-07-08 DIAGNOSIS — Z87891 Personal history of nicotine dependence: Secondary | ICD-10-CM | POA: Diagnosis not present

## 2022-07-08 DIAGNOSIS — E1122 Type 2 diabetes mellitus with diabetic chronic kidney disease: Secondary | ICD-10-CM | POA: Diagnosis not present

## 2022-07-08 DIAGNOSIS — S51011D Laceration without foreign body of right elbow, subsequent encounter: Secondary | ICD-10-CM | POA: Diagnosis not present

## 2022-07-08 DIAGNOSIS — H9193 Unspecified hearing loss, bilateral: Secondary | ICD-10-CM | POA: Diagnosis not present

## 2022-07-09 ENCOUNTER — Ambulatory Visit (INDEPENDENT_AMBULATORY_CARE_PROVIDER_SITE_OTHER): Payer: Medicare HMO | Admitting: Family Medicine

## 2022-07-09 ENCOUNTER — Encounter: Payer: Self-pay | Admitting: Family Medicine

## 2022-07-09 VITALS — BP 126/62 | HR 72 | Ht 68.0 in | Wt 191.0 lb

## 2022-07-09 DIAGNOSIS — M60232 Foreign body granuloma of soft tissue, not elsewhere classified, left forearm: Secondary | ICD-10-CM

## 2022-07-09 DIAGNOSIS — L03114 Cellulitis of left upper limb: Secondary | ICD-10-CM | POA: Diagnosis not present

## 2022-07-09 MED ORDER — CEPHALEXIN 500 MG PO CAPS
500.0000 mg | ORAL_CAPSULE | Freq: Three times a day (TID) | ORAL | 0 refills | Status: DC
Start: 2022-07-09 — End: 2022-11-25

## 2022-07-09 NOTE — Progress Notes (Signed)
Date:  07/09/2022   Name:  Cody Howard   DOB:  January 27, 1935   MRN:  [id number]   Chief Complaint: place on arm (2 places on L) elbow and forearm. )  Patient is a 88 year old male who presents for a comprehensive physical exam. The patient reports the following problems: lesion on left arm . Health maintenance has been reviewed up to date    Rash This is a new (skin tear /new onset lesion) problem. The current episode started 1 to 4 weeks ago (2 weeks). The affected locations include the left arm and left elbow (lesion arm/tear elbow). The rash is characterized by redness. Associated symptoms include fatigue. Pertinent negatives include no fever. (tenderness)    Lab Results  Component Value Date   NA 143 05/31/2022   K 5.2 05/31/2022   CO2 17 (L) 05/31/2022   GLUCOSE 156 (H) 05/31/2022   BUN 35 (H) 05/31/2022   CREATININE 2.58 (H) 05/31/2022   CALCIUM 8.3 (L) 05/31/2022   EGFR 24 (L) 05/31/2022   GFRNONAA 26 (L) 01/11/2020   Lab Results  Component Value Date   CHOL 146 06/01/2021   HDL 55 06/01/2021   LDLCALC 73 06/01/2021   TRIG 98 06/01/2021   CHOLHDL 2.3 11/29/2016   Lab Results  Component Value Date   TSH 2.710 05/31/2019   Lab Results  Component Value Date   HGBA1C 6.7 09/21/2021   Lab Results  Component Value Date   WBC 9.2 03/27/2020   HGB 12.4 (L) 03/27/2020   HCT 36.1 (L) 03/27/2020   MCV 99 (H) 03/27/2020   PLT 156 03/27/2020   Lab Results  Component Value Date   ALT 31 06/01/2021   AST 32 06/01/2021   ALKPHOS 91 06/01/2021   BILITOT 0.3 06/01/2021   No results found for: "25OHVITD2", "25OHVITD3", "VD25OH"   Review of Systems  Constitutional:  Positive for fatigue. Negative for diaphoresis and fever.  Respiratory:  Negative for chest tightness.   Cardiovascular:  Negative for chest pain and palpitations.  Skin:  Positive for rash.  Neurological:  Positive for dizziness.    Patient Active Problem List   Diagnosis Date Noted   Acute  rhinosinusitis 12/08/2020   Difficulty walking 04/24/2020   Sleeping difficulty 04/24/2020   Moderate mitral regurgitation 09/12/2019   Status post hip hemiarthroplasty 06/28/2019   Atrial fibrillation, chronic (HCC) 06/27/2019   Depression 06/27/2019   Fall at home, initial encounter 06/27/2019   Closed displaced fracture of left femoral neck (HCC) 06/27/2019   History of anemia 05/31/2019   Benign prostatic hyperplasia with lower urinary tract symptoms 05/31/2019   Cerebral atrophy, mild (HCC) 05/31/2019   Reactive depression 05/31/2019   Essential hypertension 05/31/2019   Hiatal hernia 05/31/2019   Cardiac syncope 02/20/2019   Loss of memory 05/25/2018   Dizziness 04/12/2018   TIA (transient ischemic attack) 01/25/2018   Chronic systolic CHF (congestive heart failure), NYHA class 3 (HCC) 11/29/2017   CKD (chronic kidney disease) stage 3, GFR 30-59 ml/min (HCC) 08/18/2017   Bradycardia 08/18/2017   Atrial flutter, paroxysmal (HCC) 07/21/2017   Functional dyspnea 07/06/2017   Cardiomyopathy (HCC) 07/06/2017   Hyperlipidemia, mixed 06/20/2017   Hyperlipidemia associated with type 2 diabetes mellitus (HCC) 04/06/2016   Type II diabetes mellitus with renal manifestations (HCC) 02/10/2015   Personal history of other diseases of male genital organs 02/10/2015   Sebaceous cyst 08/28/2014    No Known Allergies  Past Surgical History:  Procedure Laterality Date  COLONOSCOPY N/A 02/26/2015   Procedure: COLONOSCOPY;  Surgeon: Wallace Cullens, MD;  Location: [facility];  Service: Gastroenterology;  Laterality: N/A;   COLONOSCOPY WITH PROPOFOL N/A 11/02/2017   Procedure: COLONOSCOPY WITH PROPOFOL;  Surgeon: Toledo, Boykin Nearing, MD;  Location: [facility];  Service: Gastroenterology;  Laterality: N/A;   ESOPHAGOGASTRODUODENOSCOPY N/A 02/26/2015   Procedure: ESOPHAGOGASTRODUODENOSCOPY (EGD);  Surgeon: Wallace Cullens, MD;  Location: [facility];  Service: Gastroenterology;   Laterality: N/A;  Diabetic - oral meds   ESOPHAGOGASTRODUODENOSCOPY (EGD) WITH PROPOFOL N/A 11/02/2017   Procedure: ESOPHAGOGASTRODUODENOSCOPY (EGD) WITH PROPOFOL;  Surgeon: Toledo, Boykin Nearing, MD;  Location: [facility] END[facility];  Service: Gastroenterology;  Laterality: N/A;   HIP ARTHROPLASTY Left 06/28/2019   Procedure: ARTHROPLASTY BIPOLAR HIP (HEMIARTHROPLASTY);  Surgeon: Christena Flake, MD;  Location: [facility] ORS;  Service: Orthopedics;  Laterality: Left;   KIDNEY SURGERY Right    surgery when 88 years old   POLYPECTOMY  02/26/2015   Procedure: POLYPECTOMY;  Surgeon: Wallace Cullens, MD;  Location: [facility];  Service: Gastroenterology;;   sebaceous cyst removal Right    located right of spine on upper back    Social History   Tobacco Use   Smoking status: Former    Types: Cigarettes    Quit date: 03/08/1978    Years since quitting: 44.3   Smokeless tobacco: Never  Vaping Use   Vaping Use: Never used  Substance Use Topics   Alcohol use: Not Currently   Drug use: Never     Medication list has been reviewed and updated.  No outpatient medications have been marked as taking for the 07/09/22 encounter (Office Visit) with Duanne Limerick, MD.       07/09/2022   10:33 AM 05/31/2022    1:34 PM 10/01/2021    1:55 PM 06/01/2021    1:46 PM  GAD 7 : Generalized Anxiety Score  Nervous, Anxious, on Edge 0 0 0 1  Control/stop worrying 0 0 0 0  Worry too much - different things 0 0 0 0  Trouble relaxing 0 0 0 0  Restless 0 0 0 0  Easily annoyed or irritable 0 0 0 1  Afraid - awful might happen 0 0 0 0  Total GAD 7 Score 0 0 0 2  Anxiety Difficulty Not difficult at all Not difficult at all Not difficult at all Not difficult at all       07/09/2022   10:33 AM 05/31/2022    1:33 PM 02/03/2022    1:17 PM  Depression screen PHQ 2/9  Decreased Interest 0 0 0  Down, Depressed, Hopeless 0 0 0  PHQ - 2 Score 0 0 0  Altered sleeping 0 0 0  Tired, decreased energy 0 0 0  Change in appetite 0 0 0   Feeling bad or failure about yourself  0 0 0  Trouble concentrating 0 0 0  Moving slowly or fidgety/restless 0 0 0  Suicidal thoughts 0 0 0  PHQ-9 Score 0 0 0  Difficult doing work/chores Not difficult at all Not difficult at all Not difficult at all    BP Readings from Last 3 Encounters:  07/09/22 126/62  07/07/22 139/67  05/31/22 118/60    Physical Exam Vitals and nursing note reviewed.  Cardiovascular:     Rate and Rhythm: Normal rate.     Heart sounds: No murmur heard.    No friction rub. No gallop.  Pulmonary:     Breath sounds:  No wheezing, rhonchi or rales.  Abdominal:     Palpations: Abdomen is soft.  Skin:    General: Skin is warm.     Findings: Erythema and lesion present.     Wt Readings from Last 3 Encounters:  07/09/22 191 lb (86.6 kg)  07/07/22 191 lb (86.6 kg)  05/31/22 196 lb (88.9 kg)    BP 126/62   Pulse 72   Ht 5\' 8"  (1.727 m)   Wt 191 lb (86.6 kg)   SpO2 95%   BMI 29.04 kg/m   Assessment and Plan:  1. Cellulitis of left upper extremity New onset.  Persistent.  Small skin tear of the left elbow occur with mild trauma about 3 days ago for April 30.  Mild tenderness with no bleeding and there is some surface circumferential erythema consistent with an residual cellulitis that we will treat with Keflex 500 mg 3 times a day for 7 days. - cephALEXin (KEFLEX) 500 MG capsule; Take 1 capsule (500 mg total) by mouth 3 (three) times daily.  Dispense: 21 capsule; Refill: 0  2. Foreign body granuloma of soft tissue of left forearm Sudden onset of 6 to 8 weeks resulting in a papular lesion on the left forearm which got some mild tenderness.  Is not really a pyogenic granuloma however he has a granuloma appearance and the only thing that I can really piece together is that it may be a foreign body granuloma in the patient not realize that he had a puncture trauma perhaps.  We will refer to dermatology for evaluation in the meantime we will see if the Keflex  can help with the tenderness and if it continues to get larger or start having drainage then this would signify a possibility of a secondary infection such as an abscess. - Ambulatory referral to Dermatology    Elizabeth Sauer, MD

## 2022-07-12 DIAGNOSIS — H9193 Unspecified hearing loss, bilateral: Secondary | ICD-10-CM | POA: Diagnosis not present

## 2022-07-12 DIAGNOSIS — Z8781 Personal history of (healed) traumatic fracture: Secondary | ICD-10-CM | POA: Diagnosis not present

## 2022-07-12 DIAGNOSIS — N400 Enlarged prostate without lower urinary tract symptoms: Secondary | ICD-10-CM | POA: Diagnosis not present

## 2022-07-12 DIAGNOSIS — D631 Anemia in chronic kidney disease: Secondary | ICD-10-CM | POA: Diagnosis not present

## 2022-07-12 DIAGNOSIS — Z86010 Personal history of colonic polyps: Secondary | ICD-10-CM | POA: Diagnosis not present

## 2022-07-12 DIAGNOSIS — Z86018 Personal history of other benign neoplasm: Secondary | ICD-10-CM | POA: Diagnosis not present

## 2022-07-12 DIAGNOSIS — S81011D Laceration without foreign body, right knee, subsequent encounter: Secondary | ICD-10-CM | POA: Diagnosis not present

## 2022-07-12 DIAGNOSIS — Z7901 Long term (current) use of anticoagulants: Secondary | ICD-10-CM | POA: Diagnosis not present

## 2022-07-12 DIAGNOSIS — Z8673 Personal history of transient ischemic attack (TIA), and cerebral infarction without residual deficits: Secondary | ICD-10-CM | POA: Diagnosis not present

## 2022-07-12 DIAGNOSIS — I130 Hypertensive heart and chronic kidney disease with heart failure and stage 1 through stage 4 chronic kidney disease, or unspecified chronic kidney disease: Secondary | ICD-10-CM | POA: Diagnosis not present

## 2022-07-12 DIAGNOSIS — E1169 Type 2 diabetes mellitus with other specified complication: Secondary | ICD-10-CM | POA: Diagnosis not present

## 2022-07-12 DIAGNOSIS — I429 Cardiomyopathy, unspecified: Secondary | ICD-10-CM | POA: Diagnosis not present

## 2022-07-12 DIAGNOSIS — N184 Chronic kidney disease, stage 4 (severe): Secondary | ICD-10-CM | POA: Diagnosis not present

## 2022-07-12 DIAGNOSIS — E7849 Other hyperlipidemia: Secondary | ICD-10-CM | POA: Diagnosis not present

## 2022-07-12 DIAGNOSIS — Z7984 Long term (current) use of oral hypoglycemic drugs: Secondary | ICD-10-CM | POA: Diagnosis not present

## 2022-07-12 DIAGNOSIS — D8685 Sarcoid myocarditis: Secondary | ICD-10-CM | POA: Diagnosis not present

## 2022-07-12 DIAGNOSIS — I5022 Chronic systolic (congestive) heart failure: Secondary | ICD-10-CM | POA: Diagnosis not present

## 2022-07-12 DIAGNOSIS — F32A Depression, unspecified: Secondary | ICD-10-CM | POA: Diagnosis not present

## 2022-07-12 DIAGNOSIS — E1122 Type 2 diabetes mellitus with diabetic chronic kidney disease: Secondary | ICD-10-CM | POA: Diagnosis not present

## 2022-07-12 DIAGNOSIS — E875 Hyperkalemia: Secondary | ICD-10-CM | POA: Diagnosis not present

## 2022-07-12 DIAGNOSIS — Z87891 Personal history of nicotine dependence: Secondary | ICD-10-CM | POA: Diagnosis not present

## 2022-07-12 DIAGNOSIS — S51011D Laceration without foreign body of right elbow, subsequent encounter: Secondary | ICD-10-CM | POA: Diagnosis not present

## 2022-07-12 DIAGNOSIS — I480 Paroxysmal atrial fibrillation: Secondary | ICD-10-CM | POA: Diagnosis not present

## 2022-07-13 DIAGNOSIS — Z7901 Long term (current) use of anticoagulants: Secondary | ICD-10-CM | POA: Diagnosis not present

## 2022-07-13 DIAGNOSIS — E1169 Type 2 diabetes mellitus with other specified complication: Secondary | ICD-10-CM | POA: Diagnosis not present

## 2022-07-13 DIAGNOSIS — Z961 Presence of intraocular lens: Secondary | ICD-10-CM | POA: Diagnosis not present

## 2022-07-13 DIAGNOSIS — B880 Other acariasis: Secondary | ICD-10-CM | POA: Diagnosis not present

## 2022-07-13 DIAGNOSIS — Z86018 Personal history of other benign neoplasm: Secondary | ICD-10-CM | POA: Diagnosis not present

## 2022-07-13 DIAGNOSIS — F32A Depression, unspecified: Secondary | ICD-10-CM | POA: Diagnosis not present

## 2022-07-13 DIAGNOSIS — I10 Essential (primary) hypertension: Secondary | ICD-10-CM | POA: Diagnosis not present

## 2022-07-13 DIAGNOSIS — E119 Type 2 diabetes mellitus without complications: Secondary | ICD-10-CM | POA: Diagnosis not present

## 2022-07-13 DIAGNOSIS — N184 Chronic kidney disease, stage 4 (severe): Secondary | ICD-10-CM | POA: Diagnosis not present

## 2022-07-13 DIAGNOSIS — H9193 Unspecified hearing loss, bilateral: Secondary | ICD-10-CM | POA: Diagnosis not present

## 2022-07-13 DIAGNOSIS — H01021 Squamous blepharitis right upper eyelid: Secondary | ICD-10-CM | POA: Diagnosis not present

## 2022-07-13 DIAGNOSIS — I5022 Chronic systolic (congestive) heart failure: Secondary | ICD-10-CM | POA: Diagnosis not present

## 2022-07-13 DIAGNOSIS — Z8673 Personal history of transient ischemic attack (TIA), and cerebral infarction without residual deficits: Secondary | ICD-10-CM | POA: Diagnosis not present

## 2022-07-13 DIAGNOSIS — E875 Hyperkalemia: Secondary | ICD-10-CM | POA: Diagnosis not present

## 2022-07-13 DIAGNOSIS — Z87891 Personal history of nicotine dependence: Secondary | ICD-10-CM | POA: Diagnosis not present

## 2022-07-13 DIAGNOSIS — Z7984 Long term (current) use of oral hypoglycemic drugs: Secondary | ICD-10-CM | POA: Diagnosis not present

## 2022-07-13 DIAGNOSIS — H40013 Open angle with borderline findings, low risk, bilateral: Secondary | ICD-10-CM | POA: Diagnosis not present

## 2022-07-13 DIAGNOSIS — S51011D Laceration without foreign body of right elbow, subsequent encounter: Secondary | ICD-10-CM | POA: Diagnosis not present

## 2022-07-13 DIAGNOSIS — S81011D Laceration without foreign body, right knee, subsequent encounter: Secondary | ICD-10-CM | POA: Diagnosis not present

## 2022-07-13 DIAGNOSIS — H01024 Squamous blepharitis left upper eyelid: Secondary | ICD-10-CM | POA: Diagnosis not present

## 2022-07-13 DIAGNOSIS — E1122 Type 2 diabetes mellitus with diabetic chronic kidney disease: Secondary | ICD-10-CM | POA: Diagnosis not present

## 2022-07-13 DIAGNOSIS — Z86010 Personal history of colonic polyps: Secondary | ICD-10-CM | POA: Diagnosis not present

## 2022-07-13 DIAGNOSIS — I429 Cardiomyopathy, unspecified: Secondary | ICD-10-CM | POA: Diagnosis not present

## 2022-07-13 DIAGNOSIS — I130 Hypertensive heart and chronic kidney disease with heart failure and stage 1 through stage 4 chronic kidney disease, or unspecified chronic kidney disease: Secondary | ICD-10-CM | POA: Diagnosis not present

## 2022-07-13 DIAGNOSIS — H524 Presbyopia: Secondary | ICD-10-CM | POA: Diagnosis not present

## 2022-07-13 DIAGNOSIS — D631 Anemia in chronic kidney disease: Secondary | ICD-10-CM | POA: Diagnosis not present

## 2022-07-13 DIAGNOSIS — Z8781 Personal history of (healed) traumatic fracture: Secondary | ICD-10-CM | POA: Diagnosis not present

## 2022-07-13 DIAGNOSIS — I480 Paroxysmal atrial fibrillation: Secondary | ICD-10-CM | POA: Diagnosis not present

## 2022-07-13 DIAGNOSIS — N400 Enlarged prostate without lower urinary tract symptoms: Secondary | ICD-10-CM | POA: Diagnosis not present

## 2022-07-13 DIAGNOSIS — E7849 Other hyperlipidemia: Secondary | ICD-10-CM | POA: Diagnosis not present

## 2022-07-13 DIAGNOSIS — H35033 Hypertensive retinopathy, bilateral: Secondary | ICD-10-CM | POA: Diagnosis not present

## 2022-07-13 DIAGNOSIS — D8685 Sarcoid myocarditis: Secondary | ICD-10-CM | POA: Diagnosis not present

## 2022-07-13 LAB — HM DIABETES EYE EXAM

## 2022-07-14 ENCOUNTER — Ambulatory Visit: Payer: Self-pay | Admitting: *Deleted

## 2022-07-14 NOTE — Patient Outreach (Signed)
  Care Coordination   Follow Up Visit Note   07/14/2022 Name: Cody Howard MRN: [id number] DOB: 09-08-1934  Cody Howard is a 88 y.o. year old male who sees Cody Limerick, MD for primary care. I spoke with  Cody Howard by phone today.  What matters to the patients health and wellness today?  Wife not available today, will call back next week to get more details regarding wheelchair request    Goals Addressed             This Visit's Progress    No recurrent falls   On track    Care Coordination Interventions: Provided written and verbal education re: potential causes of falls and Fall prevention strategies Advised patient of importance of notifying provider of falls Assessed for falls since last encounter Advised patient to discuss Caregiver resources for patients with dementia with provider Discussed use of DME when going out for appointments and errands, does not have wheelchair but wife requesting help with getting one, no order from PCP yet, will reach out again Discussed participation with home health PT/OT for balance and strength, has [facility], also has nurse coming in the home.   follow up with nephrology on 6/4, endocrinology on 7/19, and cardiology on 9/24         SDOH assessments and interventions completed:  No     Care Coordination Interventions:  Yes, provided   Follow up plan: Follow up call scheduled for 5/15    Encounter Outcome:  Pt. Visit Completed   Cody Durie, RN, MSN, [facility] Care Management Care Management Coordinator [phone]

## 2022-07-21 ENCOUNTER — Encounter: Payer: Medicare HMO | Admitting: *Deleted

## 2022-07-22 DIAGNOSIS — D485 Neoplasm of uncertain behavior of skin: Secondary | ICD-10-CM | POA: Diagnosis not present

## 2022-07-22 DIAGNOSIS — C44629 Squamous cell carcinoma of skin of left upper limb, including shoulder: Secondary | ICD-10-CM | POA: Diagnosis not present

## 2022-07-23 ENCOUNTER — Ambulatory Visit: Payer: Self-pay | Admitting: *Deleted

## 2022-07-23 DIAGNOSIS — E1122 Type 2 diabetes mellitus with diabetic chronic kidney disease: Secondary | ICD-10-CM | POA: Diagnosis not present

## 2022-07-23 DIAGNOSIS — N184 Chronic kidney disease, stage 4 (severe): Secondary | ICD-10-CM | POA: Diagnosis not present

## 2022-07-23 DIAGNOSIS — Z8781 Personal history of (healed) traumatic fracture: Secondary | ICD-10-CM | POA: Diagnosis not present

## 2022-07-23 DIAGNOSIS — Z7984 Long term (current) use of oral hypoglycemic drugs: Secondary | ICD-10-CM | POA: Diagnosis not present

## 2022-07-23 DIAGNOSIS — S81011D Laceration without foreign body, right knee, subsequent encounter: Secondary | ICD-10-CM | POA: Diagnosis not present

## 2022-07-23 DIAGNOSIS — F32A Depression, unspecified: Secondary | ICD-10-CM | POA: Diagnosis not present

## 2022-07-23 DIAGNOSIS — Z86010 Personal history of colonic polyps: Secondary | ICD-10-CM | POA: Diagnosis not present

## 2022-07-23 DIAGNOSIS — E1169 Type 2 diabetes mellitus with other specified complication: Secondary | ICD-10-CM | POA: Diagnosis not present

## 2022-07-23 DIAGNOSIS — I130 Hypertensive heart and chronic kidney disease with heart failure and stage 1 through stage 4 chronic kidney disease, or unspecified chronic kidney disease: Secondary | ICD-10-CM | POA: Diagnosis not present

## 2022-07-23 DIAGNOSIS — I429 Cardiomyopathy, unspecified: Secondary | ICD-10-CM | POA: Diagnosis not present

## 2022-07-23 DIAGNOSIS — Z87891 Personal history of nicotine dependence: Secondary | ICD-10-CM | POA: Diagnosis not present

## 2022-07-23 DIAGNOSIS — Z7901 Long term (current) use of anticoagulants: Secondary | ICD-10-CM | POA: Diagnosis not present

## 2022-07-23 DIAGNOSIS — D8685 Sarcoid myocarditis: Secondary | ICD-10-CM | POA: Diagnosis not present

## 2022-07-23 DIAGNOSIS — I5022 Chronic systolic (congestive) heart failure: Secondary | ICD-10-CM | POA: Diagnosis not present

## 2022-07-23 DIAGNOSIS — E7849 Other hyperlipidemia: Secondary | ICD-10-CM | POA: Diagnosis not present

## 2022-07-23 DIAGNOSIS — E875 Hyperkalemia: Secondary | ICD-10-CM | POA: Diagnosis not present

## 2022-07-23 DIAGNOSIS — Z8673 Personal history of transient ischemic attack (TIA), and cerebral infarction without residual deficits: Secondary | ICD-10-CM | POA: Diagnosis not present

## 2022-07-23 DIAGNOSIS — H9193 Unspecified hearing loss, bilateral: Secondary | ICD-10-CM | POA: Diagnosis not present

## 2022-07-23 DIAGNOSIS — Z86018 Personal history of other benign neoplasm: Secondary | ICD-10-CM | POA: Diagnosis not present

## 2022-07-23 DIAGNOSIS — S51011D Laceration without foreign body of right elbow, subsequent encounter: Secondary | ICD-10-CM | POA: Diagnosis not present

## 2022-07-23 DIAGNOSIS — N400 Enlarged prostate without lower urinary tract symptoms: Secondary | ICD-10-CM | POA: Diagnosis not present

## 2022-07-23 DIAGNOSIS — I480 Paroxysmal atrial fibrillation: Secondary | ICD-10-CM | POA: Diagnosis not present

## 2022-07-23 DIAGNOSIS — D631 Anemia in chronic kidney disease: Secondary | ICD-10-CM | POA: Diagnosis not present

## 2022-07-23 NOTE — Patient Outreach (Signed)
  Care Coordination   Follow Up Visit Note   07/23/2022 Name: Cody Howard MRN: [id number] DOB: 01-17-1935  Cody Howard is a 88 y.o. year old male who sees Duanne Limerick, MD for primary care. I spoke with Judeth Cornfield, daughter of  CAYTON DAFOE by phone today.  What matters to the patients health and wellness today?  Per daughter, learning to care for patient with dementia.  State patient is now forgetting to perform hygiene duties, aware that things like this sometimes happen with progression of disease.     Goals Addressed             This Visit's Progress    No recurrent falls   On track    Care Coordination Interventions: Provided written and verbal education re: potential causes of falls and Fall prevention strategies Advised patient of importance of notifying provider of falls Assessed for falls since last encounter Advised patient to discuss Caregiver resources for patients with dementia with provider Discussed use of DME when going out for appointments and errands, does not have wheelchair but wife requesting help with getting one, no order from PCP yet, will reach out again Discussed participation with home health PT/OT for balance and strength, has [facility], also has nurse coming in the home.   follow up with nephrology on 6/4, endocrinology on 7/19, and cardiology on 9/24         SDOH assessments and interventions completed:  No     Care Coordination Interventions:  Yes, provided   Interventions Today    Flowsheet Row Most Recent Value  Chronic Disease   Chronic disease during today's visit Other  [Dementia]  General Interventions   General Interventions Discussed/Reviewed Durable Medical Equipment (DME), General Interventions Reviewed, Doctor Visits, Communication with  [Daughter discuss needing additional help and resources to help guide through transition of caring for dementia patient.  Placed on schedule for CSW to follow up with more community  resources]  Doctor Visits Discussed/Reviewed Doctor Visits Reviewed, Specialist  [nephrology 6/4, endocrine 7/19]  Durable Medical Equipment (DME) Wheelchair  [Has not heard from DME company for equipment, will reach out PCP office for order]  Wheelchair Standard  PCP/Specialist Visits Compliance with follow-up visit  Communication with Social Work  Education Interventions   Education Provided Provided Education  Provided Verbal Education On Medication, Nutrition  [daughter concerned as patient was taken off Aricept when discharged from hospital, inquiring if this should be restarted. Will contact PCP to ask.]  Nutrition Interventions   Nutrition Discussed/Reviewed Supplmental nutrition, Nutrition Reviewed  Eather Colas that appetite may flucuate with dementia, will use supplements if this happens]        Follow up plan: Follow up call scheduled for 5/23    Encounter Outcome:  Pt. Visit Completed   Kemper Durie, RN, MSN, [facility] Care Management Care Management Coordinator [phone]

## 2022-07-26 ENCOUNTER — Telehealth: Payer: Self-pay | Admitting: *Deleted

## 2022-07-26 ENCOUNTER — Other Ambulatory Visit: Payer: Self-pay

## 2022-07-26 ENCOUNTER — Encounter: Payer: Self-pay | Admitting: *Deleted

## 2022-07-26 DIAGNOSIS — N183 Chronic kidney disease, stage 3 unspecified: Secondary | ICD-10-CM

## 2022-07-26 DIAGNOSIS — I429 Cardiomyopathy, unspecified: Secondary | ICD-10-CM

## 2022-07-26 MED ORDER — LOSARTAN POTASSIUM 50 MG PO TABS
50.0000 mg | ORAL_TABLET | Freq: Every day | ORAL | 1 refills | Status: DC
Start: 2022-07-26 — End: 2022-09-22

## 2022-07-26 NOTE — Progress Notes (Signed)
Start back on losartan 

## 2022-07-26 NOTE — Patient Outreach (Signed)
  Care Coordination   07/26/2022 Name: Cody Howard MRN: [id number] DOB: 03/16/1934   Care Coordination Outreach Attempts:  An unsuccessful telephone outreach was attempted for a scheduled appointment today.  Follow Up Plan:  Additional outreach attempts will be made to offer the patient care coordination information and services.   Encounter Outcome:  No Answer   Care Coordination Interventions:  No, not indicated    Labrandon Knoch, LCSW Clinical Social Worker  [facility] Care Management [phone]

## 2022-07-27 ENCOUNTER — Telehealth: Payer: Self-pay

## 2022-07-27 ENCOUNTER — Telehealth: Payer: Self-pay | Admitting: Family Medicine

## 2022-07-27 DIAGNOSIS — E1122 Type 2 diabetes mellitus with diabetic chronic kidney disease: Secondary | ICD-10-CM | POA: Diagnosis not present

## 2022-07-27 DIAGNOSIS — S51011D Laceration without foreign body of right elbow, subsequent encounter: Secondary | ICD-10-CM | POA: Diagnosis not present

## 2022-07-27 DIAGNOSIS — Z86010 Personal history of colonic polyps: Secondary | ICD-10-CM | POA: Diagnosis not present

## 2022-07-27 DIAGNOSIS — F32A Depression, unspecified: Secondary | ICD-10-CM | POA: Diagnosis not present

## 2022-07-27 DIAGNOSIS — N400 Enlarged prostate without lower urinary tract symptoms: Secondary | ICD-10-CM | POA: Diagnosis not present

## 2022-07-27 DIAGNOSIS — Z8673 Personal history of transient ischemic attack (TIA), and cerebral infarction without residual deficits: Secondary | ICD-10-CM | POA: Diagnosis not present

## 2022-07-27 DIAGNOSIS — D8685 Sarcoid myocarditis: Secondary | ICD-10-CM | POA: Diagnosis not present

## 2022-07-27 DIAGNOSIS — N184 Chronic kidney disease, stage 4 (severe): Secondary | ICD-10-CM | POA: Diagnosis not present

## 2022-07-27 DIAGNOSIS — E875 Hyperkalemia: Secondary | ICD-10-CM | POA: Diagnosis not present

## 2022-07-27 DIAGNOSIS — E7849 Other hyperlipidemia: Secondary | ICD-10-CM | POA: Diagnosis not present

## 2022-07-27 DIAGNOSIS — Z87891 Personal history of nicotine dependence: Secondary | ICD-10-CM | POA: Diagnosis not present

## 2022-07-27 DIAGNOSIS — H9193 Unspecified hearing loss, bilateral: Secondary | ICD-10-CM | POA: Diagnosis not present

## 2022-07-27 DIAGNOSIS — Z7901 Long term (current) use of anticoagulants: Secondary | ICD-10-CM | POA: Diagnosis not present

## 2022-07-27 DIAGNOSIS — S81011D Laceration without foreign body, right knee, subsequent encounter: Secondary | ICD-10-CM | POA: Diagnosis not present

## 2022-07-27 DIAGNOSIS — Z8781 Personal history of (healed) traumatic fracture: Secondary | ICD-10-CM | POA: Diagnosis not present

## 2022-07-27 DIAGNOSIS — I480 Paroxysmal atrial fibrillation: Secondary | ICD-10-CM | POA: Diagnosis not present

## 2022-07-27 DIAGNOSIS — I5022 Chronic systolic (congestive) heart failure: Secondary | ICD-10-CM | POA: Diagnosis not present

## 2022-07-27 DIAGNOSIS — I130 Hypertensive heart and chronic kidney disease with heart failure and stage 1 through stage 4 chronic kidney disease, or unspecified chronic kidney disease: Secondary | ICD-10-CM | POA: Diagnosis not present

## 2022-07-27 DIAGNOSIS — D631 Anemia in chronic kidney disease: Secondary | ICD-10-CM | POA: Diagnosis not present

## 2022-07-27 DIAGNOSIS — Z7984 Long term (current) use of oral hypoglycemic drugs: Secondary | ICD-10-CM | POA: Diagnosis not present

## 2022-07-27 DIAGNOSIS — Z86018 Personal history of other benign neoplasm: Secondary | ICD-10-CM | POA: Diagnosis not present

## 2022-07-27 DIAGNOSIS — I429 Cardiomyopathy, unspecified: Secondary | ICD-10-CM | POA: Diagnosis not present

## 2022-07-27 DIAGNOSIS — E1169 Type 2 diabetes mellitus with other specified complication: Secondary | ICD-10-CM | POA: Diagnosis not present

## 2022-07-27 NOTE — Telephone Encounter (Signed)
Copied from CRM [id number]. Topic: General - Other >> Jul 27, 2022  3:14 PM Franchot Heidelberg wrote: Reason for CRM: Rinaldo Cloud from [employer] needs notes updated with [employer] notes, required by insurance. She is faxing over a request   Best contact: [phone]

## 2022-07-27 NOTE — Telephone Encounter (Signed)
Called neuro and left message concerning whether pt should start back on Aricept. Also called Boyd Kerbs and left vm that I had done this

## 2022-07-28 ENCOUNTER — Ambulatory Visit: Payer: Self-pay

## 2022-07-28 ENCOUNTER — Telehealth: Payer: Self-pay | Admitting: *Deleted

## 2022-07-28 ENCOUNTER — Other Ambulatory Visit: Payer: Self-pay | Admitting: Family Medicine

## 2022-07-28 DIAGNOSIS — Z7901 Long term (current) use of anticoagulants: Secondary | ICD-10-CM | POA: Diagnosis not present

## 2022-07-28 DIAGNOSIS — E1169 Type 2 diabetes mellitus with other specified complication: Secondary | ICD-10-CM | POA: Diagnosis not present

## 2022-07-28 DIAGNOSIS — I480 Paroxysmal atrial fibrillation: Secondary | ICD-10-CM | POA: Diagnosis not present

## 2022-07-28 DIAGNOSIS — F329 Major depressive disorder, single episode, unspecified: Secondary | ICD-10-CM

## 2022-07-28 DIAGNOSIS — Z86010 Personal history of colonic polyps: Secondary | ICD-10-CM | POA: Diagnosis not present

## 2022-07-28 DIAGNOSIS — Z8673 Personal history of transient ischemic attack (TIA), and cerebral infarction without residual deficits: Secondary | ICD-10-CM | POA: Diagnosis not present

## 2022-07-28 DIAGNOSIS — E7849 Other hyperlipidemia: Secondary | ICD-10-CM | POA: Diagnosis not present

## 2022-07-28 DIAGNOSIS — Z86018 Personal history of other benign neoplasm: Secondary | ICD-10-CM | POA: Diagnosis not present

## 2022-07-28 DIAGNOSIS — E1122 Type 2 diabetes mellitus with diabetic chronic kidney disease: Secondary | ICD-10-CM | POA: Diagnosis not present

## 2022-07-28 DIAGNOSIS — Z7984 Long term (current) use of oral hypoglycemic drugs: Secondary | ICD-10-CM | POA: Diagnosis not present

## 2022-07-28 DIAGNOSIS — Z87891 Personal history of nicotine dependence: Secondary | ICD-10-CM | POA: Diagnosis not present

## 2022-07-28 DIAGNOSIS — I130 Hypertensive heart and chronic kidney disease with heart failure and stage 1 through stage 4 chronic kidney disease, or unspecified chronic kidney disease: Secondary | ICD-10-CM | POA: Diagnosis not present

## 2022-07-28 DIAGNOSIS — F32A Depression, unspecified: Secondary | ICD-10-CM | POA: Diagnosis not present

## 2022-07-28 DIAGNOSIS — I429 Cardiomyopathy, unspecified: Secondary | ICD-10-CM | POA: Diagnosis not present

## 2022-07-28 DIAGNOSIS — E875 Hyperkalemia: Secondary | ICD-10-CM | POA: Diagnosis not present

## 2022-07-28 DIAGNOSIS — N400 Enlarged prostate without lower urinary tract symptoms: Secondary | ICD-10-CM | POA: Diagnosis not present

## 2022-07-28 DIAGNOSIS — Z8781 Personal history of (healed) traumatic fracture: Secondary | ICD-10-CM | POA: Diagnosis not present

## 2022-07-28 DIAGNOSIS — I5022 Chronic systolic (congestive) heart failure: Secondary | ICD-10-CM | POA: Diagnosis not present

## 2022-07-28 DIAGNOSIS — H9193 Unspecified hearing loss, bilateral: Secondary | ICD-10-CM | POA: Diagnosis not present

## 2022-07-28 DIAGNOSIS — N184 Chronic kidney disease, stage 4 (severe): Secondary | ICD-10-CM | POA: Diagnosis not present

## 2022-07-28 DIAGNOSIS — D8685 Sarcoid myocarditis: Secondary | ICD-10-CM | POA: Diagnosis not present

## 2022-07-28 DIAGNOSIS — S51011D Laceration without foreign body of right elbow, subsequent encounter: Secondary | ICD-10-CM | POA: Diagnosis not present

## 2022-07-28 DIAGNOSIS — D631 Anemia in chronic kidney disease: Secondary | ICD-10-CM | POA: Diagnosis not present

## 2022-07-28 DIAGNOSIS — S81011D Laceration without foreign body, right knee, subsequent encounter: Secondary | ICD-10-CM | POA: Diagnosis not present

## 2022-07-28 NOTE — Patient Instructions (Signed)
Visit Information  Thank you for taking time to visit with me today. Please don't hesitate to contact me if I can be of assistance to you.   Following are the goals we discussed today:   Goals Addressed   None      If you are experiencing a Mental Health or Behavioral Health Crisis or need someone to talk to, please call 911  Patient verbalizes understanding of instructions and care plan provided today and agrees to view in MyChart. Active MyChart status and patient understanding of how to access instructions and care plan via MyChart confirmed with patient.     No further follow up required:      Amenah Tucci, BSW Social Worker [facility] Care Management  [phone]  

## 2022-07-28 NOTE — Patient Outreach (Signed)
  Care Coordination   Follow Up Visit Note   07/28/2022 Name: Cody Howard MRN: [id number] DOB: Mar 02, 1935  Cody Howard is a 88 y.o. year old male who sees Duanne Limerick, MD for primary care. I spoke with Judeth Cornfield, daughter of Cody Howard by phone today.  What matters to the patients health and wellness today?  Gather resources for dementia/caregiver needs    SDOH assessments and interventions completed:  No     Care Coordination Interventions:  Yes, provided   Interventions Today    Flowsheet Row Most Recent Value  Chronic Disease   Chronic disease during today's visit Other  [Dementia]  General Interventions   General Interventions Discussed/Reviewed Communication with  Communication with Social Work  Educational psychologist SW that daughter would like to speak regarding resources, patient placed on SW schedule]        Follow up plan: Follow up call scheduled for 5/23    Encounter Outcome:  Pt. Visit Completed   Kemper Durie, RN, MSN, [facility] Care Management Care Management Coordinator [phone]

## 2022-07-28 NOTE — Telephone Encounter (Signed)
Requested Prescriptions  Pending Prescriptions Disp Refills   sertraline (ZOLOFT) 25 MG tablet [Pharmacy Med Name: SERTRALINE TAB 25MG ] 30 tablet 0    Sig: TAKE (1) TABLET BY MOUTH EVERY DAY     Psychiatry:  Antidepressants - SSRI - sertraline Failed - 07/28/2022 12:10 PM      Failed - AST in normal range and within 360 days    AST  Date Value Ref Range Status  06/01/2021 32 0 - 40 IU/L Final         Failed - ALT in normal range and within 360 days    ALT  Date Value Ref Range Status  06/01/2021 31 0 - 44 IU/L Final         Passed - Completed PHQ-2 or PHQ-9 in the last 360 days      Passed - Valid encounter within last 6 months    Recent Outpatient Visits           2 weeks ago Cellulitis of left upper extremity   [place] [facility]   1 month ago Vasovagal syncope   [place] [facility]   10 months ago Cardiomyopathy, unspecified type [facility])   [place] [facility], [facility]   1 year ago Essential hypertension   [place] [facility], [facility]   1 year ago Type 2 diabetes mellitus without complication, without long-term current use of insulin (HCC)   [place] [facility] at [facility] Duanne Limerick, MD               FEROSUL 325 (65 Fe) MG tablet [Pharmacy Med Name: FEROSUL TAB 325MG ] 90 tablet 0    Sig: TAKE (1) TABLET BY MOUTH EVERY DAY WITH BREAKFAST     Endocrinology:  Minerals - Iron Supplementation Failed - 07/28/2022 12:10 PM      Failed - HGB in normal range and within 360 days    Hemoglobin  Date Value Ref Range Status  03/27/2020 12.4 (L) 13.0 - 17.7 g/dL Final         Failed - HCT in normal range and within 360 days    Hematocrit  Date Value Ref Range Status  03/27/2020 36.1 (L) 37.5 - 51.0 % Final          Failed - RBC in normal range and within 360 days    RBC  Date Value Ref Range Status  03/27/2020 3.66 (L) 4.14 - 5.80 x10E6/uL Final  07/03/2019 3.35 (L) 4.22 - 5.81 MIL/uL Final         Failed - Fe (serum) in normal range and within 360 days    Iron  Date Value Ref Range Status  03/12/2019 77 38 - 169 ug/dL Final   Iron Saturation  Date Value Ref Range Status  03/12/2019 25 15 - 55 % Final         Failed - Ferritin in normal range and within 360 days    Ferritin  Date Value Ref Range Status  03/27/2020 67 30 - 400 ng/mL Final         Passed - Valid encounter within last 12 months    Recent Outpatient Visits           2 weeks ago Cellulitis of left  upper extremity   [place] [facility]   1 month ago Vasovagal syncope   [facility]   10 months ago Cardiomyopathy, unspecified type [facility])   [place] [facility], [facility]   1 year ago Essential hypertension   [place] [facility], MD   1 year ago Type 2 diabetes mellitus without complication, without long-term current use of insulin [facility])   [place] [facility], [facility]

## 2022-07-28 NOTE — Patient Outreach (Signed)
  Care Coordination   Initial Visit Note   07/28/2022 Name: Cody Howard MRN: [id number] DOB: 1935/03/01  Cody Howard is a 88 y.o. year old male who sees Cody Limerick, MD for primary care. I spoke with  Cody Howard wife Cody Howard by phone today.  What matters to the patients health and wellness today?  SW contacted patient to determine if additional support is needed for Respite. Patients wife has additional support from her son and daughter and declines need to extra help.  She feels that the family has things under control.  Wife declines life alert, free adult diaper assistance and recommendation to contact insurance for Respite options but agreed to consider if the situation changes.  Referred to [facility] for future assistance or patients wife can contact the provider should things change.   Goals Addressed   None     SDOH assessments and interventions completed:  Yes     Care Coordination Interventions:  Yes, provided    Follow up plan: No further intervention required.   Encounter Outcome:  Pt. Visit Completed

## 2022-07-29 ENCOUNTER — Ambulatory Visit: Payer: Self-pay

## 2022-07-29 ENCOUNTER — Ambulatory Visit: Payer: Self-pay | Admitting: *Deleted

## 2022-07-29 DIAGNOSIS — D631 Anemia in chronic kidney disease: Secondary | ICD-10-CM | POA: Diagnosis not present

## 2022-07-29 DIAGNOSIS — I429 Cardiomyopathy, unspecified: Secondary | ICD-10-CM | POA: Diagnosis not present

## 2022-07-29 DIAGNOSIS — I130 Hypertensive heart and chronic kidney disease with heart failure and stage 1 through stage 4 chronic kidney disease, or unspecified chronic kidney disease: Secondary | ICD-10-CM | POA: Diagnosis not present

## 2022-07-29 DIAGNOSIS — I5022 Chronic systolic (congestive) heart failure: Secondary | ICD-10-CM | POA: Diagnosis not present

## 2022-07-29 DIAGNOSIS — E1122 Type 2 diabetes mellitus with diabetic chronic kidney disease: Secondary | ICD-10-CM | POA: Diagnosis not present

## 2022-07-29 DIAGNOSIS — E875 Hyperkalemia: Secondary | ICD-10-CM | POA: Diagnosis not present

## 2022-07-29 DIAGNOSIS — D8685 Sarcoid myocarditis: Secondary | ICD-10-CM | POA: Diagnosis not present

## 2022-07-29 DIAGNOSIS — I480 Paroxysmal atrial fibrillation: Secondary | ICD-10-CM | POA: Diagnosis not present

## 2022-07-29 DIAGNOSIS — E1169 Type 2 diabetes mellitus with other specified complication: Secondary | ICD-10-CM | POA: Diagnosis not present

## 2022-07-29 DIAGNOSIS — N184 Chronic kidney disease, stage 4 (severe): Secondary | ICD-10-CM | POA: Diagnosis not present

## 2022-07-29 NOTE — Patient Outreach (Signed)
  Care Coordination   Follow Up Visit Note   07/29/2022 Name: Cody Howard MRN: [id number] DOB: [birth year]  Cody Howard is a 88 y.o. year old male who sees Duanne Limerick, MD for primary care. I spoke with  Lillie Fragmin  and his daughter Michael Boston by phone today.  What matters to the patients health and wellness today?  Patient provided verbal consent to speak to daughter.  SW reviewed prior conversation.  Mrs. Kauffmann decline Respite and diaper assistance.  SW provided the contact information to Ms. Maisie Fus and encouraged to [employer] in advance to determine if coverage is provided. Ms. Maisie Fus agreed to support her mothers wishes and will continue to monitor the situation.    Goals Addressed   None     SDOH assessments and interventions completed:  No     Care Coordination Interventions:  No, not indicated   Follow up plan: No further intervention required.   Encounter Outcome:  Pt. Visit Completed

## 2022-07-29 NOTE — Patient Outreach (Signed)
  Care Coordination   Follow Up Visit Note   07/29/2022 Name: TYLERJAMES VENTURI MRN: [id number] DOB: [birth year]  WYNSTON KALAFUT is a 88 y.o. year old male who sees Duanne Limerick, MD for primary care. I spoke with Judeth Cornfield, daughter of  SUNSHINE ZEMBA by phone today.  What matters to the patients health and wellness today?  Obtain wheelchair    Goals Addressed             This Visit's Progress    No recurrent falls   On track    Care Coordination Interventions: Provided written and verbal education re: potential causes of falls and Fall prevention strategies Advised patient of importance of notifying provider of falls Assessed for falls since last encounter Advised patient to discuss Caregiver resources for patients with dementia with provider Discussed use of DME when going out for appointments and errands, does not have wheelchair but wife requesting help with getting one, no order from PCP yet, will reach out again Discussed participation with home health PT/OT for balance and strength, has [facility], also has nurse coming in the home.   follow up with nephrology on 6/4, endocrinology on 7/19, and cardiology on 9/24         SDOH assessments and interventions completed:  No     Care Coordination Interventions:  Yes, provided   Interventions Today    Flowsheet Row Most Recent Value  Chronic Disease   Chronic disease during today's visit Other  [fall]  General Interventions   General Interventions Discussed/Reviewed Durable Medical Equipment (DME), Communication with  Durable Medical Equipment (DME) Wheelchair  Wheelchair Standard  Communication with PCP/Specialists  [Contacted PCP office and [employer] to follow up on wheelchair order.  Per PCP office, all clinical notes have been faxed, [employer] does not have.  Request made to fax again.]        Follow up plan: Follow up call scheduled for 5/31    Encounter Outcome:  Pt. Visit Completed   Kemper Durie, RN, MSN,  [facility] Care Management Care Management Coordinator [phone]

## 2022-07-30 ENCOUNTER — Telehealth: Payer: Self-pay | Admitting: Family Medicine

## 2022-07-30 ENCOUNTER — Telehealth: Payer: Self-pay | Admitting: *Deleted

## 2022-07-30 ENCOUNTER — Telehealth: Payer: Self-pay

## 2022-07-30 NOTE — Telephone Encounter (Signed)
Copied from CRM [id number]. Topic: General - Other >> Jul 30, 2022 10:43 AM Frutoso Chase wrote: Cody Howard with [employer] is calling in because she says they need to get a transport chair narrative addended into the pt's clinical notes. Cody Howard can be reached at [phone]

## 2022-07-30 NOTE — Telephone Encounter (Signed)
Called [employer] and spoke with Chassity [phone] and asked what exactly needs to be stated on notes in order for insurance to pay for wheelchair. She said it needed to say "Mobility limitations that impairs ability to do 1 or more MRADLs in the location of the home. Patient cannot use a cane, walker, or crutch." I explained that if that was the case, we really do not have anybody that would qualify under these circumstances. Also, explained to her that the pt's wife was the one taking care of her and needed it to get him from house to car and to appointments. She said that didn't matter, needed to state that. I called Insurance account manager for [facility] and told her all this.

## 2022-07-30 NOTE — Patient Outreach (Signed)
  Care Coordination   Follow Up Visit Note   07/30/2022 Name: Cody Howard MRN: [id number] DOB: [birth year]  Cody Howard is a 88 y.o. year old male who sees Duanne Limerick, MD for primary care.   What matters to the patients health and wellness today?  Obtain wheelchair.  Marcelle Smiling will call daughter directly to explain DME denial.  This RNCM will continue to look for other options if family unable to purchase one independently.      Goals Addressed             This Visit's Progress    No recurrent falls   On track    Care Coordination Interventions: Provided written and verbal education re: potential causes of falls and Fall prevention strategies Advised patient of importance of notifying provider of falls Assessed for falls since last encounter Advised patient to discuss Caregiver resources for patients with dementia with provider Discussed use of DME when going out for appointments and errands, does not have wheelchair but wife requesting help with getting one, no order from PCP yet, will reach out again Discussed participation with home health PT/OT for balance and strength, has [facility], also has nurse coming in the home.   follow up with nephrology on 6/4, endocrinology on 7/19, and cardiology on 9/24         SDOH assessments and interventions completed:  No     Care Coordination Interventions:  Yes, provided   Interventions Today    Flowsheet Row Most Recent Value  Chronic Disease   Chronic disease during today's visit Other  General Interventions   General Interventions Discussed/Reviewed Durable Medical Equipment (DME), Communication with  Durable Medical Equipment (DME) Wheelchair  Wheelchair Standard  Communication with PCP/Specialists  [Spoke with Delice Bison at PCP office, notified that patient will not qualify for wheelchair.  Call placed to [facility], they do not have transport wheelchairs now, but requested this RNCM check back in a couple weeks]        Follow up plan: Follow up call scheduled for 5/31    Encounter Outcome:  Pt. Visit Completed   Kemper Durie, RN, MSN, [facility] Care Management Care Management Coordinator [phone]

## 2022-08-05 DIAGNOSIS — I5022 Chronic systolic (congestive) heart failure: Secondary | ICD-10-CM | POA: Diagnosis not present

## 2022-08-05 DIAGNOSIS — D631 Anemia in chronic kidney disease: Secondary | ICD-10-CM | POA: Diagnosis not present

## 2022-08-05 DIAGNOSIS — D8685 Sarcoid myocarditis: Secondary | ICD-10-CM | POA: Diagnosis not present

## 2022-08-05 DIAGNOSIS — N184 Chronic kidney disease, stage 4 (severe): Secondary | ICD-10-CM | POA: Diagnosis not present

## 2022-08-05 DIAGNOSIS — I130 Hypertensive heart and chronic kidney disease with heart failure and stage 1 through stage 4 chronic kidney disease, or unspecified chronic kidney disease: Secondary | ICD-10-CM | POA: Diagnosis not present

## 2022-08-05 DIAGNOSIS — I480 Paroxysmal atrial fibrillation: Secondary | ICD-10-CM | POA: Diagnosis not present

## 2022-08-05 DIAGNOSIS — E1122 Type 2 diabetes mellitus with diabetic chronic kidney disease: Secondary | ICD-10-CM | POA: Diagnosis not present

## 2022-08-06 ENCOUNTER — Ambulatory Visit: Payer: Self-pay | Admitting: *Deleted

## 2022-08-06 DIAGNOSIS — D631 Anemia in chronic kidney disease: Secondary | ICD-10-CM | POA: Diagnosis not present

## 2022-08-06 DIAGNOSIS — I429 Cardiomyopathy, unspecified: Secondary | ICD-10-CM | POA: Diagnosis not present

## 2022-08-06 DIAGNOSIS — D8685 Sarcoid myocarditis: Secondary | ICD-10-CM | POA: Diagnosis not present

## 2022-08-06 DIAGNOSIS — E1169 Type 2 diabetes mellitus with other specified complication: Secondary | ICD-10-CM | POA: Diagnosis not present

## 2022-08-06 DIAGNOSIS — E875 Hyperkalemia: Secondary | ICD-10-CM | POA: Diagnosis not present

## 2022-08-06 DIAGNOSIS — I130 Hypertensive heart and chronic kidney disease with heart failure and stage 1 through stage 4 chronic kidney disease, or unspecified chronic kidney disease: Secondary | ICD-10-CM | POA: Diagnosis not present

## 2022-08-06 DIAGNOSIS — N184 Chronic kidney disease, stage 4 (severe): Secondary | ICD-10-CM | POA: Diagnosis not present

## 2022-08-06 DIAGNOSIS — I480 Paroxysmal atrial fibrillation: Secondary | ICD-10-CM | POA: Diagnosis not present

## 2022-08-06 DIAGNOSIS — E1122 Type 2 diabetes mellitus with diabetic chronic kidney disease: Secondary | ICD-10-CM | POA: Diagnosis not present

## 2022-08-06 DIAGNOSIS — I5022 Chronic systolic (congestive) heart failure: Secondary | ICD-10-CM | POA: Diagnosis not present

## 2022-08-06 NOTE — Patient Outreach (Signed)
  Care Coordination   Follow Up Visit Note   08/06/2022 Name: Cody Howard MRN: [id number] DOB: [birth year]  Cody Howard is a 88 y.o. year old male who sees Duanne Limerick, MD for primary care. I spoke with Judeth Cornfield, daughter of Cody Howard by phone today.  What matters to the patients health and wellness today?  Obtain wheelchair    Goals Addressed             This Visit's Progress    No recurrent falls   On track    Care Coordination Interventions: Provided written and verbal education re: potential causes of falls and Fall prevention strategies Advised patient of importance of notifying provider of falls Assessed for falls since last encounter Advised patient to discuss Caregiver resources for patients with dementia with provider Discussed use of DME when going out for appointments and errands, does not have wheelchair Discussed participation with home health PT/OT for balance and strength, has [facility], also has nurse coming in the home.   follow up with nephrology on 6/4, endocrinology on 7/19, and cardiology on 9/24         SDOH assessments and interventions completed:  No     Care Coordination Interventions:  Yes, provided   Interventions Today    Flowsheet Row Most Recent Value  Chronic Disease   Chronic disease during today's visit Other  [dementia and falls]  General Interventions   General Interventions Discussed/Reviewed General Interventions Reviewed, Durable Medical Equipment (DME), Communication with  Durable Medical Equipment (DME) Wheelchair  [Daughter looking into other options for wheelchair]  Wheelchair Standard  Communication with --  [Call placed to [facility], discussed reason for denial of DME.  Patient is able to have one approved if he is unable to use walker at all, insurance will not allow patient to have both walker and wheelchair (must document patient is unsafe without walker)]        Follow up plan: Follow up call scheduled  for 6/10    Encounter Outcome:  Pt. Visit Completed   Kemper Durie, RN, MSN, [facility] Care Management Care Management Coordinator [phone]

## 2022-08-09 DIAGNOSIS — N184 Chronic kidney disease, stage 4 (severe): Secondary | ICD-10-CM | POA: Diagnosis not present

## 2022-08-09 DIAGNOSIS — D8685 Sarcoid myocarditis: Secondary | ICD-10-CM | POA: Diagnosis not present

## 2022-08-09 DIAGNOSIS — E875 Hyperkalemia: Secondary | ICD-10-CM | POA: Diagnosis not present

## 2022-08-09 DIAGNOSIS — E1169 Type 2 diabetes mellitus with other specified complication: Secondary | ICD-10-CM | POA: Diagnosis not present

## 2022-08-09 DIAGNOSIS — I130 Hypertensive heart and chronic kidney disease with heart failure and stage 1 through stage 4 chronic kidney disease, or unspecified chronic kidney disease: Secondary | ICD-10-CM | POA: Diagnosis not present

## 2022-08-09 DIAGNOSIS — D631 Anemia in chronic kidney disease: Secondary | ICD-10-CM | POA: Diagnosis not present

## 2022-08-09 DIAGNOSIS — I5022 Chronic systolic (congestive) heart failure: Secondary | ICD-10-CM | POA: Diagnosis not present

## 2022-08-09 DIAGNOSIS — I480 Paroxysmal atrial fibrillation: Secondary | ICD-10-CM | POA: Diagnosis not present

## 2022-08-09 DIAGNOSIS — E1122 Type 2 diabetes mellitus with diabetic chronic kidney disease: Secondary | ICD-10-CM | POA: Diagnosis not present

## 2022-08-09 DIAGNOSIS — I429 Cardiomyopathy, unspecified: Secondary | ICD-10-CM | POA: Diagnosis not present

## 2022-08-10 DIAGNOSIS — D631 Anemia in chronic kidney disease: Secondary | ICD-10-CM | POA: Diagnosis not present

## 2022-08-10 DIAGNOSIS — N2581 Secondary hyperparathyroidism of renal origin: Secondary | ICD-10-CM | POA: Diagnosis not present

## 2022-08-10 DIAGNOSIS — N184 Chronic kidney disease, stage 4 (severe): Secondary | ICD-10-CM | POA: Diagnosis not present

## 2022-08-10 DIAGNOSIS — E1122 Type 2 diabetes mellitus with diabetic chronic kidney disease: Secondary | ICD-10-CM | POA: Diagnosis not present

## 2022-08-10 DIAGNOSIS — R809 Proteinuria, unspecified: Secondary | ICD-10-CM | POA: Diagnosis not present

## 2022-08-10 DIAGNOSIS — I10 Essential (primary) hypertension: Secondary | ICD-10-CM | POA: Diagnosis not present

## 2022-08-12 ENCOUNTER — Telehealth: Payer: Self-pay | Admitting: Family Medicine

## 2022-08-12 NOTE — Telephone Encounter (Signed)
Pt aware you are out of office

## 2022-08-12 NOTE — Telephone Encounter (Signed)
Copied from CRM [id number]. Topic: General - Other >> Aug 12, 2022  3:40 PM Dominique A wrote: Reason for CRM: Pt wife is calling to speak with Delice Bison regarding the pt wheel chair.  Please have Delice Bison call the pt back. (Pt wife was informed Delice Bison will be out of office and will return on Monday.)

## 2022-08-13 DIAGNOSIS — I130 Hypertensive heart and chronic kidney disease with heart failure and stage 1 through stage 4 chronic kidney disease, or unspecified chronic kidney disease: Secondary | ICD-10-CM | POA: Diagnosis not present

## 2022-08-13 DIAGNOSIS — E1122 Type 2 diabetes mellitus with diabetic chronic kidney disease: Secondary | ICD-10-CM | POA: Diagnosis not present

## 2022-08-13 DIAGNOSIS — D631 Anemia in chronic kidney disease: Secondary | ICD-10-CM | POA: Diagnosis not present

## 2022-08-13 DIAGNOSIS — D8685 Sarcoid myocarditis: Secondary | ICD-10-CM | POA: Diagnosis not present

## 2022-08-13 DIAGNOSIS — I5022 Chronic systolic (congestive) heart failure: Secondary | ICD-10-CM | POA: Diagnosis not present

## 2022-08-13 DIAGNOSIS — E875 Hyperkalemia: Secondary | ICD-10-CM | POA: Diagnosis not present

## 2022-08-13 DIAGNOSIS — I480 Paroxysmal atrial fibrillation: Secondary | ICD-10-CM | POA: Diagnosis not present

## 2022-08-13 DIAGNOSIS — N184 Chronic kidney disease, stage 4 (severe): Secondary | ICD-10-CM | POA: Diagnosis not present

## 2022-08-13 DIAGNOSIS — E1169 Type 2 diabetes mellitus with other specified complication: Secondary | ICD-10-CM | POA: Diagnosis not present

## 2022-08-13 DIAGNOSIS — I429 Cardiomyopathy, unspecified: Secondary | ICD-10-CM | POA: Diagnosis not present

## 2022-08-16 ENCOUNTER — Ambulatory Visit: Payer: Self-pay | Admitting: *Deleted

## 2022-08-16 NOTE — Patient Outreach (Signed)
  Care Coordination   Follow Up Visit Note   08/16/2022 Name: Cody Howard MRN: [id number] DOB: 01/01/1935  Cody Howard is a 88 y.o. year old male who sees Duanne Limerick, MD for primary care. I spoke with Avie Arenas by phone today.  What matters to the patients health and wellness today?  Maintaining patient without falls    Goals Addressed             This Visit's Progress    No recurrent falls   On track    Care Coordination Interventions: Provided written and verbal education re: potential causes of falls and Fall prevention strategies Advised patient of importance of notifying provider of falls Assessed for falls since last encounter Advised patient to discuss Caregiver resources for patients with dementia with provider Discussed use of DME when going out for appointments and errands, does not have wheelchair Discussed participation with home health PT/OT for balance and strength, has [facility], also has nurse coming in the home.   follow up with nephrology on 6/4, endocrinology on 7/19, and cardiology on 9/24         SDOH assessments and interventions completed:  No     Care Coordination Interventions:  Yes, provided   Interventions Today    Flowsheet Row Most Recent Value  Chronic Disease   Chronic disease during today's visit Other  [dementia/fall]  General Interventions   General Interventions Discussed/Reviewed Durable Medical Equipment (DME), Communication with  Durable Medical Equipment (DME) Wheelchair  [daughter state they have been given a brand new wheelchair by a friend]  Communication with --  [Call placed to [facility], they do not have transport wheelchair, but they do have standard]       Follow up plan: Follow up call scheduled for 7/11    Encounter Outcome:  Pt. Visit Completed   Kemper Durie, RN, MSN, [facility] Care Management Care Management Coordinator [phone]

## 2022-08-19 DIAGNOSIS — R2689 Other abnormalities of gait and mobility: Secondary | ICD-10-CM | POA: Diagnosis not present

## 2022-08-19 DIAGNOSIS — I951 Orthostatic hypotension: Secondary | ICD-10-CM | POA: Diagnosis not present

## 2022-08-19 DIAGNOSIS — R413 Other amnesia: Secondary | ICD-10-CM | POA: Diagnosis not present

## 2022-08-20 DIAGNOSIS — I480 Paroxysmal atrial fibrillation: Secondary | ICD-10-CM | POA: Diagnosis not present

## 2022-08-20 DIAGNOSIS — E1122 Type 2 diabetes mellitus with diabetic chronic kidney disease: Secondary | ICD-10-CM | POA: Diagnosis not present

## 2022-08-20 DIAGNOSIS — N184 Chronic kidney disease, stage 4 (severe): Secondary | ICD-10-CM | POA: Diagnosis not present

## 2022-08-20 DIAGNOSIS — D631 Anemia in chronic kidney disease: Secondary | ICD-10-CM | POA: Diagnosis not present

## 2022-08-20 DIAGNOSIS — I5022 Chronic systolic (congestive) heart failure: Secondary | ICD-10-CM | POA: Diagnosis not present

## 2022-08-20 DIAGNOSIS — D8685 Sarcoid myocarditis: Secondary | ICD-10-CM | POA: Diagnosis not present

## 2022-08-20 DIAGNOSIS — I130 Hypertensive heart and chronic kidney disease with heart failure and stage 1 through stage 4 chronic kidney disease, or unspecified chronic kidney disease: Secondary | ICD-10-CM | POA: Diagnosis not present

## 2022-08-20 DIAGNOSIS — E875 Hyperkalemia: Secondary | ICD-10-CM | POA: Diagnosis not present

## 2022-08-20 DIAGNOSIS — I429 Cardiomyopathy, unspecified: Secondary | ICD-10-CM | POA: Diagnosis not present

## 2022-08-20 DIAGNOSIS — E1169 Type 2 diabetes mellitus with other specified complication: Secondary | ICD-10-CM | POA: Diagnosis not present

## 2022-08-23 DIAGNOSIS — E875 Hyperkalemia: Secondary | ICD-10-CM | POA: Diagnosis not present

## 2022-08-23 DIAGNOSIS — D631 Anemia in chronic kidney disease: Secondary | ICD-10-CM | POA: Diagnosis not present

## 2022-08-23 DIAGNOSIS — I429 Cardiomyopathy, unspecified: Secondary | ICD-10-CM | POA: Diagnosis not present

## 2022-08-23 DIAGNOSIS — D8685 Sarcoid myocarditis: Secondary | ICD-10-CM | POA: Diagnosis not present

## 2022-08-23 DIAGNOSIS — E1169 Type 2 diabetes mellitus with other specified complication: Secondary | ICD-10-CM | POA: Diagnosis not present

## 2022-08-23 DIAGNOSIS — I130 Hypertensive heart and chronic kidney disease with heart failure and stage 1 through stage 4 chronic kidney disease, or unspecified chronic kidney disease: Secondary | ICD-10-CM | POA: Diagnosis not present

## 2022-08-23 DIAGNOSIS — I480 Paroxysmal atrial fibrillation: Secondary | ICD-10-CM | POA: Diagnosis not present

## 2022-08-23 DIAGNOSIS — N184 Chronic kidney disease, stage 4 (severe): Secondary | ICD-10-CM | POA: Diagnosis not present

## 2022-08-23 DIAGNOSIS — E1122 Type 2 diabetes mellitus with diabetic chronic kidney disease: Secondary | ICD-10-CM | POA: Diagnosis not present

## 2022-08-23 DIAGNOSIS — I5022 Chronic systolic (congestive) heart failure: Secondary | ICD-10-CM | POA: Diagnosis not present

## 2022-08-25 ENCOUNTER — Other Ambulatory Visit: Payer: Self-pay | Admitting: Family Medicine

## 2022-08-25 DIAGNOSIS — F329 Major depressive disorder, single episode, unspecified: Secondary | ICD-10-CM

## 2022-08-25 DIAGNOSIS — E119 Type 2 diabetes mellitus without complications: Secondary | ICD-10-CM

## 2022-08-26 DIAGNOSIS — I429 Cardiomyopathy, unspecified: Secondary | ICD-10-CM | POA: Diagnosis not present

## 2022-08-26 DIAGNOSIS — I5022 Chronic systolic (congestive) heart failure: Secondary | ICD-10-CM | POA: Diagnosis not present

## 2022-08-26 DIAGNOSIS — D631 Anemia in chronic kidney disease: Secondary | ICD-10-CM | POA: Diagnosis not present

## 2022-08-26 DIAGNOSIS — I130 Hypertensive heart and chronic kidney disease with heart failure and stage 1 through stage 4 chronic kidney disease, or unspecified chronic kidney disease: Secondary | ICD-10-CM | POA: Diagnosis not present

## 2022-08-26 DIAGNOSIS — E1169 Type 2 diabetes mellitus with other specified complication: Secondary | ICD-10-CM | POA: Diagnosis not present

## 2022-08-26 DIAGNOSIS — D8685 Sarcoid myocarditis: Secondary | ICD-10-CM | POA: Diagnosis not present

## 2022-08-26 DIAGNOSIS — N184 Chronic kidney disease, stage 4 (severe): Secondary | ICD-10-CM | POA: Diagnosis not present

## 2022-08-26 DIAGNOSIS — E1122 Type 2 diabetes mellitus with diabetic chronic kidney disease: Secondary | ICD-10-CM | POA: Diagnosis not present

## 2022-08-26 DIAGNOSIS — E875 Hyperkalemia: Secondary | ICD-10-CM | POA: Diagnosis not present

## 2022-08-26 DIAGNOSIS — I480 Paroxysmal atrial fibrillation: Secondary | ICD-10-CM | POA: Diagnosis not present

## 2022-08-31 DIAGNOSIS — N184 Chronic kidney disease, stage 4 (severe): Secondary | ICD-10-CM | POA: Diagnosis not present

## 2022-08-31 DIAGNOSIS — Z86018 Personal history of other benign neoplasm: Secondary | ICD-10-CM | POA: Diagnosis not present

## 2022-08-31 DIAGNOSIS — D631 Anemia in chronic kidney disease: Secondary | ICD-10-CM | POA: Diagnosis not present

## 2022-08-31 DIAGNOSIS — D8685 Sarcoid myocarditis: Secondary | ICD-10-CM | POA: Diagnosis not present

## 2022-08-31 DIAGNOSIS — Z8781 Personal history of (healed) traumatic fracture: Secondary | ICD-10-CM | POA: Diagnosis not present

## 2022-08-31 DIAGNOSIS — Z7984 Long term (current) use of oral hypoglycemic drugs: Secondary | ICD-10-CM | POA: Diagnosis not present

## 2022-08-31 DIAGNOSIS — I429 Cardiomyopathy, unspecified: Secondary | ICD-10-CM | POA: Diagnosis not present

## 2022-08-31 DIAGNOSIS — Z87891 Personal history of nicotine dependence: Secondary | ICD-10-CM | POA: Diagnosis not present

## 2022-08-31 DIAGNOSIS — S51011D Laceration without foreign body of right elbow, subsequent encounter: Secondary | ICD-10-CM | POA: Diagnosis not present

## 2022-08-31 DIAGNOSIS — E1169 Type 2 diabetes mellitus with other specified complication: Secondary | ICD-10-CM | POA: Diagnosis not present

## 2022-08-31 DIAGNOSIS — N400 Enlarged prostate without lower urinary tract symptoms: Secondary | ICD-10-CM | POA: Diagnosis not present

## 2022-08-31 DIAGNOSIS — I5022 Chronic systolic (congestive) heart failure: Secondary | ICD-10-CM | POA: Diagnosis not present

## 2022-08-31 DIAGNOSIS — Z86010 Personal history of colonic polyps: Secondary | ICD-10-CM | POA: Diagnosis not present

## 2022-08-31 DIAGNOSIS — I480 Paroxysmal atrial fibrillation: Secondary | ICD-10-CM | POA: Diagnosis not present

## 2022-08-31 DIAGNOSIS — E1122 Type 2 diabetes mellitus with diabetic chronic kidney disease: Secondary | ICD-10-CM | POA: Diagnosis not present

## 2022-08-31 DIAGNOSIS — S81011D Laceration without foreign body, right knee, subsequent encounter: Secondary | ICD-10-CM | POA: Diagnosis not present

## 2022-08-31 DIAGNOSIS — H9193 Unspecified hearing loss, bilateral: Secondary | ICD-10-CM | POA: Diagnosis not present

## 2022-08-31 DIAGNOSIS — E7849 Other hyperlipidemia: Secondary | ICD-10-CM | POA: Diagnosis not present

## 2022-08-31 DIAGNOSIS — E875 Hyperkalemia: Secondary | ICD-10-CM | POA: Diagnosis not present

## 2022-08-31 DIAGNOSIS — Z8673 Personal history of transient ischemic attack (TIA), and cerebral infarction without residual deficits: Secondary | ICD-10-CM | POA: Diagnosis not present

## 2022-08-31 DIAGNOSIS — F32A Depression, unspecified: Secondary | ICD-10-CM | POA: Diagnosis not present

## 2022-08-31 DIAGNOSIS — Z7901 Long term (current) use of anticoagulants: Secondary | ICD-10-CM | POA: Diagnosis not present

## 2022-08-31 DIAGNOSIS — I130 Hypertensive heart and chronic kidney disease with heart failure and stage 1 through stage 4 chronic kidney disease, or unspecified chronic kidney disease: Secondary | ICD-10-CM | POA: Diagnosis not present

## 2022-09-06 DIAGNOSIS — C44629 Squamous cell carcinoma of skin of left upper limb, including shoulder: Secondary | ICD-10-CM | POA: Diagnosis not present

## 2022-09-08 DIAGNOSIS — S81011D Laceration without foreign body, right knee, subsequent encounter: Secondary | ICD-10-CM | POA: Diagnosis not present

## 2022-09-08 DIAGNOSIS — E1169 Type 2 diabetes mellitus with other specified complication: Secondary | ICD-10-CM | POA: Diagnosis not present

## 2022-09-08 DIAGNOSIS — I130 Hypertensive heart and chronic kidney disease with heart failure and stage 1 through stage 4 chronic kidney disease, or unspecified chronic kidney disease: Secondary | ICD-10-CM | POA: Diagnosis not present

## 2022-09-08 DIAGNOSIS — D631 Anemia in chronic kidney disease: Secondary | ICD-10-CM | POA: Diagnosis not present

## 2022-09-08 DIAGNOSIS — Z86010 Personal history of colonic polyps: Secondary | ICD-10-CM | POA: Diagnosis not present

## 2022-09-08 DIAGNOSIS — H9193 Unspecified hearing loss, bilateral: Secondary | ICD-10-CM | POA: Diagnosis not present

## 2022-09-08 DIAGNOSIS — D8685 Sarcoid myocarditis: Secondary | ICD-10-CM | POA: Diagnosis not present

## 2022-09-08 DIAGNOSIS — I429 Cardiomyopathy, unspecified: Secondary | ICD-10-CM | POA: Diagnosis not present

## 2022-09-08 DIAGNOSIS — N400 Enlarged prostate without lower urinary tract symptoms: Secondary | ICD-10-CM | POA: Diagnosis not present

## 2022-09-08 DIAGNOSIS — I480 Paroxysmal atrial fibrillation: Secondary | ICD-10-CM | POA: Diagnosis not present

## 2022-09-08 DIAGNOSIS — Z8673 Personal history of transient ischemic attack (TIA), and cerebral infarction without residual deficits: Secondary | ICD-10-CM | POA: Diagnosis not present

## 2022-09-08 DIAGNOSIS — E875 Hyperkalemia: Secondary | ICD-10-CM | POA: Diagnosis not present

## 2022-09-08 DIAGNOSIS — F32A Depression, unspecified: Secondary | ICD-10-CM | POA: Diagnosis not present

## 2022-09-08 DIAGNOSIS — Z7901 Long term (current) use of anticoagulants: Secondary | ICD-10-CM | POA: Diagnosis not present

## 2022-09-08 DIAGNOSIS — S51011D Laceration without foreign body of right elbow, subsequent encounter: Secondary | ICD-10-CM | POA: Diagnosis not present

## 2022-09-08 DIAGNOSIS — Z7984 Long term (current) use of oral hypoglycemic drugs: Secondary | ICD-10-CM | POA: Diagnosis not present

## 2022-09-08 DIAGNOSIS — N184 Chronic kidney disease, stage 4 (severe): Secondary | ICD-10-CM | POA: Diagnosis not present

## 2022-09-08 DIAGNOSIS — Z86018 Personal history of other benign neoplasm: Secondary | ICD-10-CM | POA: Diagnosis not present

## 2022-09-08 DIAGNOSIS — Z87891 Personal history of nicotine dependence: Secondary | ICD-10-CM | POA: Diagnosis not present

## 2022-09-08 DIAGNOSIS — Z8781 Personal history of (healed) traumatic fracture: Secondary | ICD-10-CM | POA: Diagnosis not present

## 2022-09-08 DIAGNOSIS — E7849 Other hyperlipidemia: Secondary | ICD-10-CM | POA: Diagnosis not present

## 2022-09-08 DIAGNOSIS — E1122 Type 2 diabetes mellitus with diabetic chronic kidney disease: Secondary | ICD-10-CM | POA: Diagnosis not present

## 2022-09-08 DIAGNOSIS — I5022 Chronic systolic (congestive) heart failure: Secondary | ICD-10-CM | POA: Diagnosis not present

## 2022-09-15 DIAGNOSIS — Z7901 Long term (current) use of anticoagulants: Secondary | ICD-10-CM | POA: Diagnosis not present

## 2022-09-15 DIAGNOSIS — Z7984 Long term (current) use of oral hypoglycemic drugs: Secondary | ICD-10-CM | POA: Diagnosis not present

## 2022-09-15 DIAGNOSIS — Z87891 Personal history of nicotine dependence: Secondary | ICD-10-CM | POA: Diagnosis not present

## 2022-09-15 DIAGNOSIS — I480 Paroxysmal atrial fibrillation: Secondary | ICD-10-CM | POA: Diagnosis not present

## 2022-09-15 DIAGNOSIS — I5022 Chronic systolic (congestive) heart failure: Secondary | ICD-10-CM | POA: Diagnosis not present

## 2022-09-15 DIAGNOSIS — N184 Chronic kidney disease, stage 4 (severe): Secondary | ICD-10-CM | POA: Diagnosis not present

## 2022-09-15 DIAGNOSIS — H9193 Unspecified hearing loss, bilateral: Secondary | ICD-10-CM | POA: Diagnosis not present

## 2022-09-15 DIAGNOSIS — S81011D Laceration without foreign body, right knee, subsequent encounter: Secondary | ICD-10-CM | POA: Diagnosis not present

## 2022-09-15 DIAGNOSIS — Z86018 Personal history of other benign neoplasm: Secondary | ICD-10-CM | POA: Diagnosis not present

## 2022-09-15 DIAGNOSIS — S51011D Laceration without foreign body of right elbow, subsequent encounter: Secondary | ICD-10-CM | POA: Diagnosis not present

## 2022-09-15 DIAGNOSIS — I130 Hypertensive heart and chronic kidney disease with heart failure and stage 1 through stage 4 chronic kidney disease, or unspecified chronic kidney disease: Secondary | ICD-10-CM | POA: Diagnosis not present

## 2022-09-15 DIAGNOSIS — E1122 Type 2 diabetes mellitus with diabetic chronic kidney disease: Secondary | ICD-10-CM | POA: Diagnosis not present

## 2022-09-15 DIAGNOSIS — Z8781 Personal history of (healed) traumatic fracture: Secondary | ICD-10-CM | POA: Diagnosis not present

## 2022-09-15 DIAGNOSIS — N400 Enlarged prostate without lower urinary tract symptoms: Secondary | ICD-10-CM | POA: Diagnosis not present

## 2022-09-15 DIAGNOSIS — D631 Anemia in chronic kidney disease: Secondary | ICD-10-CM | POA: Diagnosis not present

## 2022-09-15 DIAGNOSIS — Z8673 Personal history of transient ischemic attack (TIA), and cerebral infarction without residual deficits: Secondary | ICD-10-CM | POA: Diagnosis not present

## 2022-09-15 DIAGNOSIS — E875 Hyperkalemia: Secondary | ICD-10-CM | POA: Diagnosis not present

## 2022-09-15 DIAGNOSIS — E7849 Other hyperlipidemia: Secondary | ICD-10-CM | POA: Diagnosis not present

## 2022-09-15 DIAGNOSIS — I429 Cardiomyopathy, unspecified: Secondary | ICD-10-CM | POA: Diagnosis not present

## 2022-09-15 DIAGNOSIS — F32A Depression, unspecified: Secondary | ICD-10-CM | POA: Diagnosis not present

## 2022-09-15 DIAGNOSIS — Z86010 Personal history of colonic polyps: Secondary | ICD-10-CM | POA: Diagnosis not present

## 2022-09-15 DIAGNOSIS — E1169 Type 2 diabetes mellitus with other specified complication: Secondary | ICD-10-CM | POA: Diagnosis not present

## 2022-09-15 DIAGNOSIS — D8685 Sarcoid myocarditis: Secondary | ICD-10-CM | POA: Diagnosis not present

## 2022-09-16 ENCOUNTER — Ambulatory Visit: Payer: Self-pay | Admitting: *Deleted

## 2022-09-16 NOTE — Patient Outreach (Signed)
  Care Coordination   Follow Up Visit Note   09/16/2022 Name: Cody Howard MRN: [id number] DOB: 06/10/1934  WILLMER FELLERS is a 88 y.o. year old male who sees Duanne Limerick, MD for primary care. I spoke with Judeth Cornfield, daughter of [name] by phone today.  What matters to the patients health and wellness today? Per daughter, take it a day at a time and keep patient as stable as possible.    Goals Addressed             This Visit's Progress    No recurrent falls   Not on track    Care Coordination Interventions: Provided written and verbal education re: potential causes of falls and Fall prevention strategies Advised patient of importance of notifying provider of falls Assessed for falls since last encounter Advised patient to discuss Caregiver resources for patients with dementia with provider Discussed participation with home health PT/OT for balance and strength, has [facility], also has nurse coming in the home.   Endocrinology on 7/19, and cardiology on 9/24         SDOH assessments and interventions completed:  No     Care Coordination Interventions:  Yes, provided   Interventions Today    Flowsheet Row Most Recent Value  Chronic Disease   Chronic disease during today's visit Other  [Dementia/falls, has had 2 falls in the last 2 weeks]  General Interventions   General Interventions Discussed/Reviewed General Interventions Reviewed, Durable Medical Equipment (DME), Doctor Visits  Doctor Visits Discussed/Reviewed Doctor Visits Reviewed, PCP, Specialist  [Endocrine 7/19, nephrology 9/5, neuro 9/19]  Durable Medical Equipment (DME) Wheelchair  Wheelchair Standard  [Now has lightweight wheelchair for better transport]  PCP/Specialist Visits Compliance with follow-up visit  [Continues with HHPT]  Pharmacy Interventions   Pharmacy Dicussed/Reviewed Medications and their functions, Medication Adherence  [Namenda was added last month, daughter report  compliance and some slight improvement]  Safety Interventions   Safety Discussed/Reviewed Fall Risk, Safety Reviewed  [Using walker and wheelchair for safety.  Daughter has life alert system to be installed on 7/17]       Follow up plan: Follow up call scheduled for 9/25    Encounter Outcome:  Pt. Visit Completed   Cody Durie, RN, MSN, [facility] Care Management Care Management Coordinator [phone]

## 2022-09-21 ENCOUNTER — Other Ambulatory Visit: Payer: Self-pay | Admitting: Family Medicine

## 2022-09-21 DIAGNOSIS — N183 Chronic kidney disease, stage 3 unspecified: Secondary | ICD-10-CM

## 2022-09-21 DIAGNOSIS — I429 Cardiomyopathy, unspecified: Secondary | ICD-10-CM

## 2022-09-21 DIAGNOSIS — F329 Major depressive disorder, single episode, unspecified: Secondary | ICD-10-CM

## 2022-09-22 NOTE — Telephone Encounter (Signed)
Requested Prescriptions  Pending Prescriptions Disp Refills   sertraline (ZOLOFT) 25 MG tablet [Pharmacy Med Name: SERTRALINE TAB 25MG ] 30 tablet 0    Sig: TAKE (1) TABLET BY MOUTH EVERY DAY     Psychiatry:  Antidepressants - SSRI - sertraline Failed - 09/21/2022 12:48 PM      Failed - AST in normal range and within 360 days    AST  Date Value Ref Range Status  06/01/2021 32 0 - 40 IU/L Final         Failed - ALT in normal range and within 360 days    ALT  Date Value Ref Range Status  06/01/2021 31 0 - 44 IU/L Final         Passed - Completed PHQ-2 or PHQ-9 in the last 360 days      Passed - Valid encounter within last 6 months    Recent Outpatient Visits           2 months ago Cellulitis of left upper extremity   [place] [facility]   3 months ago Vasovagal syncope   [place] [facility], [facility]   11 months ago Cardiomyopathy, unspecified type [facility])   [place] [facility], MD   1 year ago Essential hypertension   [place] [facility], MD   1 year ago Type 2 diabetes mellitus without complication, without long-term current use of insulin (HCC)   [place] [facility], [facility]               losartan (COZAAR) 50 MG tablet [Pharmacy Med Name: LOSARTAN POT TAB 50MG ] 30 tablet 0    Sig: TAKE 1 TABLET (50 MG TOTAL) BY MOUTH DAILY.     Cardiovascular:  Angiotensin Receptor Blockers Failed - 09/21/2022 12:48 PM      Failed - Cr in normal range and within 180 days    Creatinine, Ser  Date Value Ref Range Status  05/31/2022 2.58 (H) 0.76 - 1.27 mg/dL Final         Passed - K in normal range and within 180 days    Potassium  Date Value Ref Range Status  05/31/2022 5.2 3.5 - 5.2 mmol/L Final          Passed - Patient is not pregnant      Passed - Last BP in normal range    BP Readings from Last 1 Encounters:  07/09/22 126/62         Passed - Valid encounter within last 6 months    Recent Outpatient Visits           2 months ago Cellulitis of left upper extremity   [place] [facility]   3 months ago Vasovagal syncope   [place] [facility], [facility]   11 months ago Cardiomyopathy, unspecified type [facility])   [place] [facility]   1 year ago Essential hypertension   [place] [facility]   1 year ago Type 2 diabetes mellitus without complication,  without long-term current use of insulin [facility])   [place] [facility], [facility]

## 2022-09-23 DIAGNOSIS — E875 Hyperkalemia: Secondary | ICD-10-CM | POA: Diagnosis not present

## 2022-09-23 DIAGNOSIS — D631 Anemia in chronic kidney disease: Secondary | ICD-10-CM | POA: Diagnosis not present

## 2022-09-23 DIAGNOSIS — S51011D Laceration without foreign body of right elbow, subsequent encounter: Secondary | ICD-10-CM | POA: Diagnosis not present

## 2022-09-23 DIAGNOSIS — Z87891 Personal history of nicotine dependence: Secondary | ICD-10-CM | POA: Diagnosis not present

## 2022-09-23 DIAGNOSIS — E1169 Type 2 diabetes mellitus with other specified complication: Secondary | ICD-10-CM | POA: Diagnosis not present

## 2022-09-23 DIAGNOSIS — E1122 Type 2 diabetes mellitus with diabetic chronic kidney disease: Secondary | ICD-10-CM | POA: Diagnosis not present

## 2022-09-23 DIAGNOSIS — N184 Chronic kidney disease, stage 4 (severe): Secondary | ICD-10-CM | POA: Diagnosis not present

## 2022-09-23 DIAGNOSIS — Z86018 Personal history of other benign neoplasm: Secondary | ICD-10-CM | POA: Diagnosis not present

## 2022-09-23 DIAGNOSIS — I480 Paroxysmal atrial fibrillation: Secondary | ICD-10-CM | POA: Diagnosis not present

## 2022-09-23 DIAGNOSIS — I429 Cardiomyopathy, unspecified: Secondary | ICD-10-CM | POA: Diagnosis not present

## 2022-09-23 DIAGNOSIS — E7849 Other hyperlipidemia: Secondary | ICD-10-CM | POA: Diagnosis not present

## 2022-09-23 DIAGNOSIS — Z7901 Long term (current) use of anticoagulants: Secondary | ICD-10-CM | POA: Diagnosis not present

## 2022-09-23 DIAGNOSIS — Z8673 Personal history of transient ischemic attack (TIA), and cerebral infarction without residual deficits: Secondary | ICD-10-CM | POA: Diagnosis not present

## 2022-09-23 DIAGNOSIS — F32A Depression, unspecified: Secondary | ICD-10-CM | POA: Diagnosis not present

## 2022-09-23 DIAGNOSIS — I130 Hypertensive heart and chronic kidney disease with heart failure and stage 1 through stage 4 chronic kidney disease, or unspecified chronic kidney disease: Secondary | ICD-10-CM | POA: Diagnosis not present

## 2022-09-23 DIAGNOSIS — D8685 Sarcoid myocarditis: Secondary | ICD-10-CM | POA: Diagnosis not present

## 2022-09-23 DIAGNOSIS — H9193 Unspecified hearing loss, bilateral: Secondary | ICD-10-CM | POA: Diagnosis not present

## 2022-09-23 DIAGNOSIS — S81011D Laceration without foreign body, right knee, subsequent encounter: Secondary | ICD-10-CM | POA: Diagnosis not present

## 2022-09-23 DIAGNOSIS — Z7984 Long term (current) use of oral hypoglycemic drugs: Secondary | ICD-10-CM | POA: Diagnosis not present

## 2022-09-23 DIAGNOSIS — N400 Enlarged prostate without lower urinary tract symptoms: Secondary | ICD-10-CM | POA: Diagnosis not present

## 2022-09-23 DIAGNOSIS — Z86010 Personal history of colonic polyps: Secondary | ICD-10-CM | POA: Diagnosis not present

## 2022-09-23 DIAGNOSIS — I5022 Chronic systolic (congestive) heart failure: Secondary | ICD-10-CM | POA: Diagnosis not present

## 2022-09-23 DIAGNOSIS — Z8781 Personal history of (healed) traumatic fracture: Secondary | ICD-10-CM | POA: Diagnosis not present

## 2022-09-24 DIAGNOSIS — E1169 Type 2 diabetes mellitus with other specified complication: Secondary | ICD-10-CM | POA: Diagnosis not present

## 2022-09-24 DIAGNOSIS — E785 Hyperlipidemia, unspecified: Secondary | ICD-10-CM | POA: Diagnosis not present

## 2022-09-24 DIAGNOSIS — I152 Hypertension secondary to endocrine disorders: Secondary | ICD-10-CM | POA: Diagnosis not present

## 2022-09-24 DIAGNOSIS — E1159 Type 2 diabetes mellitus with other circulatory complications: Secondary | ICD-10-CM | POA: Diagnosis not present

## 2022-09-24 DIAGNOSIS — E1165 Type 2 diabetes mellitus with hyperglycemia: Secondary | ICD-10-CM | POA: Diagnosis not present

## 2022-09-24 LAB — HM DIABETES FOOT EXAM: HM Diabetic Foot Exam: NORMAL

## 2022-09-24 LAB — HEMOGLOBIN A1C: Hemoglobin A1C: 7.4

## 2022-09-30 DIAGNOSIS — Z8781 Personal history of (healed) traumatic fracture: Secondary | ICD-10-CM | POA: Diagnosis not present

## 2022-09-30 DIAGNOSIS — S51011D Laceration without foreign body of right elbow, subsequent encounter: Secondary | ICD-10-CM | POA: Diagnosis not present

## 2022-09-30 DIAGNOSIS — Z86010 Personal history of colonic polyps: Secondary | ICD-10-CM | POA: Diagnosis not present

## 2022-09-30 DIAGNOSIS — D631 Anemia in chronic kidney disease: Secondary | ICD-10-CM | POA: Diagnosis not present

## 2022-09-30 DIAGNOSIS — E1169 Type 2 diabetes mellitus with other specified complication: Secondary | ICD-10-CM | POA: Diagnosis not present

## 2022-09-30 DIAGNOSIS — I5022 Chronic systolic (congestive) heart failure: Secondary | ICD-10-CM | POA: Diagnosis not present

## 2022-09-30 DIAGNOSIS — N400 Enlarged prostate without lower urinary tract symptoms: Secondary | ICD-10-CM | POA: Diagnosis not present

## 2022-09-30 DIAGNOSIS — Z87891 Personal history of nicotine dependence: Secondary | ICD-10-CM | POA: Diagnosis not present

## 2022-09-30 DIAGNOSIS — S81011D Laceration without foreign body, right knee, subsequent encounter: Secondary | ICD-10-CM | POA: Diagnosis not present

## 2022-09-30 DIAGNOSIS — I130 Hypertensive heart and chronic kidney disease with heart failure and stage 1 through stage 4 chronic kidney disease, or unspecified chronic kidney disease: Secondary | ICD-10-CM | POA: Diagnosis not present

## 2022-09-30 DIAGNOSIS — Z7984 Long term (current) use of oral hypoglycemic drugs: Secondary | ICD-10-CM | POA: Diagnosis not present

## 2022-09-30 DIAGNOSIS — H9193 Unspecified hearing loss, bilateral: Secondary | ICD-10-CM | POA: Diagnosis not present

## 2022-09-30 DIAGNOSIS — Z86018 Personal history of other benign neoplasm: Secondary | ICD-10-CM | POA: Diagnosis not present

## 2022-09-30 DIAGNOSIS — E1122 Type 2 diabetes mellitus with diabetic chronic kidney disease: Secondary | ICD-10-CM | POA: Diagnosis not present

## 2022-09-30 DIAGNOSIS — I429 Cardiomyopathy, unspecified: Secondary | ICD-10-CM | POA: Diagnosis not present

## 2022-09-30 DIAGNOSIS — Z7901 Long term (current) use of anticoagulants: Secondary | ICD-10-CM | POA: Diagnosis not present

## 2022-09-30 DIAGNOSIS — F32A Depression, unspecified: Secondary | ICD-10-CM | POA: Diagnosis not present

## 2022-09-30 DIAGNOSIS — D8685 Sarcoid myocarditis: Secondary | ICD-10-CM | POA: Diagnosis not present

## 2022-09-30 DIAGNOSIS — E7849 Other hyperlipidemia: Secondary | ICD-10-CM | POA: Diagnosis not present

## 2022-09-30 DIAGNOSIS — I480 Paroxysmal atrial fibrillation: Secondary | ICD-10-CM | POA: Diagnosis not present

## 2022-09-30 DIAGNOSIS — N184 Chronic kidney disease, stage 4 (severe): Secondary | ICD-10-CM | POA: Diagnosis not present

## 2022-09-30 DIAGNOSIS — E875 Hyperkalemia: Secondary | ICD-10-CM | POA: Diagnosis not present

## 2022-09-30 DIAGNOSIS — Z8673 Personal history of transient ischemic attack (TIA), and cerebral infarction without residual deficits: Secondary | ICD-10-CM | POA: Diagnosis not present

## 2022-10-04 DIAGNOSIS — I130 Hypertensive heart and chronic kidney disease with heart failure and stage 1 through stage 4 chronic kidney disease, or unspecified chronic kidney disease: Secondary | ICD-10-CM | POA: Diagnosis not present

## 2022-10-04 DIAGNOSIS — E1122 Type 2 diabetes mellitus with diabetic chronic kidney disease: Secondary | ICD-10-CM | POA: Diagnosis not present

## 2022-10-04 DIAGNOSIS — N184 Chronic kidney disease, stage 4 (severe): Secondary | ICD-10-CM | POA: Diagnosis not present

## 2022-10-04 DIAGNOSIS — D631 Anemia in chronic kidney disease: Secondary | ICD-10-CM | POA: Diagnosis not present

## 2022-10-04 DIAGNOSIS — I5022 Chronic systolic (congestive) heart failure: Secondary | ICD-10-CM | POA: Diagnosis not present

## 2022-10-04 DIAGNOSIS — D8685 Sarcoid myocarditis: Secondary | ICD-10-CM | POA: Diagnosis not present

## 2022-10-04 DIAGNOSIS — I480 Paroxysmal atrial fibrillation: Secondary | ICD-10-CM | POA: Diagnosis not present

## 2022-10-07 DIAGNOSIS — Z8673 Personal history of transient ischemic attack (TIA), and cerebral infarction without residual deficits: Secondary | ICD-10-CM | POA: Diagnosis not present

## 2022-10-07 DIAGNOSIS — E1169 Type 2 diabetes mellitus with other specified complication: Secondary | ICD-10-CM | POA: Diagnosis not present

## 2022-10-07 DIAGNOSIS — Z86010 Personal history of colonic polyps: Secondary | ICD-10-CM | POA: Diagnosis not present

## 2022-10-07 DIAGNOSIS — E1122 Type 2 diabetes mellitus with diabetic chronic kidney disease: Secondary | ICD-10-CM | POA: Diagnosis not present

## 2022-10-07 DIAGNOSIS — Z7901 Long term (current) use of anticoagulants: Secondary | ICD-10-CM | POA: Diagnosis not present

## 2022-10-07 DIAGNOSIS — F32A Depression, unspecified: Secondary | ICD-10-CM | POA: Diagnosis not present

## 2022-10-07 DIAGNOSIS — Z87891 Personal history of nicotine dependence: Secondary | ICD-10-CM | POA: Diagnosis not present

## 2022-10-07 DIAGNOSIS — D8685 Sarcoid myocarditis: Secondary | ICD-10-CM | POA: Diagnosis not present

## 2022-10-07 DIAGNOSIS — I480 Paroxysmal atrial fibrillation: Secondary | ICD-10-CM | POA: Diagnosis not present

## 2022-10-07 DIAGNOSIS — Z8781 Personal history of (healed) traumatic fracture: Secondary | ICD-10-CM | POA: Diagnosis not present

## 2022-10-07 DIAGNOSIS — N400 Enlarged prostate without lower urinary tract symptoms: Secondary | ICD-10-CM | POA: Diagnosis not present

## 2022-10-07 DIAGNOSIS — N184 Chronic kidney disease, stage 4 (severe): Secondary | ICD-10-CM | POA: Diagnosis not present

## 2022-10-07 DIAGNOSIS — E7849 Other hyperlipidemia: Secondary | ICD-10-CM | POA: Diagnosis not present

## 2022-10-07 DIAGNOSIS — I5022 Chronic systolic (congestive) heart failure: Secondary | ICD-10-CM | POA: Diagnosis not present

## 2022-10-07 DIAGNOSIS — Z86018 Personal history of other benign neoplasm: Secondary | ICD-10-CM | POA: Diagnosis not present

## 2022-10-07 DIAGNOSIS — E875 Hyperkalemia: Secondary | ICD-10-CM | POA: Diagnosis not present

## 2022-10-07 DIAGNOSIS — I429 Cardiomyopathy, unspecified: Secondary | ICD-10-CM | POA: Diagnosis not present

## 2022-10-07 DIAGNOSIS — H9193 Unspecified hearing loss, bilateral: Secondary | ICD-10-CM | POA: Diagnosis not present

## 2022-10-07 DIAGNOSIS — I130 Hypertensive heart and chronic kidney disease with heart failure and stage 1 through stage 4 chronic kidney disease, or unspecified chronic kidney disease: Secondary | ICD-10-CM | POA: Diagnosis not present

## 2022-10-07 DIAGNOSIS — S51011D Laceration without foreign body of right elbow, subsequent encounter: Secondary | ICD-10-CM | POA: Diagnosis not present

## 2022-10-07 DIAGNOSIS — D631 Anemia in chronic kidney disease: Secondary | ICD-10-CM | POA: Diagnosis not present

## 2022-10-07 DIAGNOSIS — S81011D Laceration without foreign body, right knee, subsequent encounter: Secondary | ICD-10-CM | POA: Diagnosis not present

## 2022-10-07 DIAGNOSIS — Z7984 Long term (current) use of oral hypoglycemic drugs: Secondary | ICD-10-CM | POA: Diagnosis not present

## 2022-10-12 DIAGNOSIS — Z86018 Personal history of other benign neoplasm: Secondary | ICD-10-CM | POA: Diagnosis not present

## 2022-10-12 DIAGNOSIS — Z7984 Long term (current) use of oral hypoglycemic drugs: Secondary | ICD-10-CM | POA: Diagnosis not present

## 2022-10-12 DIAGNOSIS — Z7901 Long term (current) use of anticoagulants: Secondary | ICD-10-CM | POA: Diagnosis not present

## 2022-10-12 DIAGNOSIS — I5022 Chronic systolic (congestive) heart failure: Secondary | ICD-10-CM | POA: Diagnosis not present

## 2022-10-12 DIAGNOSIS — Z8673 Personal history of transient ischemic attack (TIA), and cerebral infarction without residual deficits: Secondary | ICD-10-CM | POA: Diagnosis not present

## 2022-10-12 DIAGNOSIS — I130 Hypertensive heart and chronic kidney disease with heart failure and stage 1 through stage 4 chronic kidney disease, or unspecified chronic kidney disease: Secondary | ICD-10-CM | POA: Diagnosis not present

## 2022-10-12 DIAGNOSIS — H9193 Unspecified hearing loss, bilateral: Secondary | ICD-10-CM | POA: Diagnosis not present

## 2022-10-12 DIAGNOSIS — F32A Depression, unspecified: Secondary | ICD-10-CM | POA: Diagnosis not present

## 2022-10-12 DIAGNOSIS — I480 Paroxysmal atrial fibrillation: Secondary | ICD-10-CM | POA: Diagnosis not present

## 2022-10-12 DIAGNOSIS — D631 Anemia in chronic kidney disease: Secondary | ICD-10-CM | POA: Diagnosis not present

## 2022-10-12 DIAGNOSIS — E875 Hyperkalemia: Secondary | ICD-10-CM | POA: Diagnosis not present

## 2022-10-12 DIAGNOSIS — D8685 Sarcoid myocarditis: Secondary | ICD-10-CM | POA: Diagnosis not present

## 2022-10-12 DIAGNOSIS — S51011D Laceration without foreign body of right elbow, subsequent encounter: Secondary | ICD-10-CM | POA: Diagnosis not present

## 2022-10-12 DIAGNOSIS — E1169 Type 2 diabetes mellitus with other specified complication: Secondary | ICD-10-CM | POA: Diagnosis not present

## 2022-10-12 DIAGNOSIS — N184 Chronic kidney disease, stage 4 (severe): Secondary | ICD-10-CM | POA: Diagnosis not present

## 2022-10-12 DIAGNOSIS — N400 Enlarged prostate without lower urinary tract symptoms: Secondary | ICD-10-CM | POA: Diagnosis not present

## 2022-10-12 DIAGNOSIS — Z8781 Personal history of (healed) traumatic fracture: Secondary | ICD-10-CM | POA: Diagnosis not present

## 2022-10-12 DIAGNOSIS — E7849 Other hyperlipidemia: Secondary | ICD-10-CM | POA: Diagnosis not present

## 2022-10-12 DIAGNOSIS — S81011D Laceration without foreign body, right knee, subsequent encounter: Secondary | ICD-10-CM | POA: Diagnosis not present

## 2022-10-12 DIAGNOSIS — I429 Cardiomyopathy, unspecified: Secondary | ICD-10-CM | POA: Diagnosis not present

## 2022-10-12 DIAGNOSIS — Z87891 Personal history of nicotine dependence: Secondary | ICD-10-CM | POA: Diagnosis not present

## 2022-10-12 DIAGNOSIS — E1122 Type 2 diabetes mellitus with diabetic chronic kidney disease: Secondary | ICD-10-CM | POA: Diagnosis not present

## 2022-10-12 DIAGNOSIS — Z86010 Personal history of colonic polyps: Secondary | ICD-10-CM | POA: Diagnosis not present

## 2022-10-14 DIAGNOSIS — E875 Hyperkalemia: Secondary | ICD-10-CM | POA: Diagnosis not present

## 2022-10-14 DIAGNOSIS — I5022 Chronic systolic (congestive) heart failure: Secondary | ICD-10-CM | POA: Diagnosis not present

## 2022-10-14 DIAGNOSIS — Z8673 Personal history of transient ischemic attack (TIA), and cerebral infarction without residual deficits: Secondary | ICD-10-CM | POA: Diagnosis not present

## 2022-10-14 DIAGNOSIS — I480 Paroxysmal atrial fibrillation: Secondary | ICD-10-CM | POA: Diagnosis not present

## 2022-10-14 DIAGNOSIS — D631 Anemia in chronic kidney disease: Secondary | ICD-10-CM | POA: Diagnosis not present

## 2022-10-14 DIAGNOSIS — N400 Enlarged prostate without lower urinary tract symptoms: Secondary | ICD-10-CM | POA: Diagnosis not present

## 2022-10-14 DIAGNOSIS — Z7984 Long term (current) use of oral hypoglycemic drugs: Secondary | ICD-10-CM | POA: Diagnosis not present

## 2022-10-14 DIAGNOSIS — Z86010 Personal history of colonic polyps: Secondary | ICD-10-CM | POA: Diagnosis not present

## 2022-10-14 DIAGNOSIS — I130 Hypertensive heart and chronic kidney disease with heart failure and stage 1 through stage 4 chronic kidney disease, or unspecified chronic kidney disease: Secondary | ICD-10-CM | POA: Diagnosis not present

## 2022-10-14 DIAGNOSIS — E1169 Type 2 diabetes mellitus with other specified complication: Secondary | ICD-10-CM | POA: Diagnosis not present

## 2022-10-14 DIAGNOSIS — Z86018 Personal history of other benign neoplasm: Secondary | ICD-10-CM | POA: Diagnosis not present

## 2022-10-14 DIAGNOSIS — E1122 Type 2 diabetes mellitus with diabetic chronic kidney disease: Secondary | ICD-10-CM | POA: Diagnosis not present

## 2022-10-14 DIAGNOSIS — D8685 Sarcoid myocarditis: Secondary | ICD-10-CM | POA: Diagnosis not present

## 2022-10-14 DIAGNOSIS — E7849 Other hyperlipidemia: Secondary | ICD-10-CM | POA: Diagnosis not present

## 2022-10-14 DIAGNOSIS — Z87891 Personal history of nicotine dependence: Secondary | ICD-10-CM | POA: Diagnosis not present

## 2022-10-14 DIAGNOSIS — Z7901 Long term (current) use of anticoagulants: Secondary | ICD-10-CM | POA: Diagnosis not present

## 2022-10-14 DIAGNOSIS — I429 Cardiomyopathy, unspecified: Secondary | ICD-10-CM | POA: Diagnosis not present

## 2022-10-14 DIAGNOSIS — F32A Depression, unspecified: Secondary | ICD-10-CM | POA: Diagnosis not present

## 2022-10-14 DIAGNOSIS — S81011D Laceration without foreign body, right knee, subsequent encounter: Secondary | ICD-10-CM | POA: Diagnosis not present

## 2022-10-14 DIAGNOSIS — H9193 Unspecified hearing loss, bilateral: Secondary | ICD-10-CM | POA: Diagnosis not present

## 2022-10-14 DIAGNOSIS — Z8781 Personal history of (healed) traumatic fracture: Secondary | ICD-10-CM | POA: Diagnosis not present

## 2022-10-14 DIAGNOSIS — N184 Chronic kidney disease, stage 4 (severe): Secondary | ICD-10-CM | POA: Diagnosis not present

## 2022-10-14 DIAGNOSIS — S51011D Laceration without foreign body of right elbow, subsequent encounter: Secondary | ICD-10-CM | POA: Diagnosis not present

## 2022-10-18 ENCOUNTER — Telehealth: Payer: Self-pay | Admitting: *Deleted

## 2022-10-18 NOTE — Telephone Encounter (Signed)
I connected with Cody Howard (pt wife) on 8/12 at [phone] by telephone and verified that I am speaking with the correct person using two identifiers. According to the patient's chart they are due for physical with [facility]. Pt scheduled. There are no transportation issues but pt can only come in the afternoon for physical. Nothing further was needed at the end of our conversation.

## 2022-10-21 ENCOUNTER — Other Ambulatory Visit: Payer: Self-pay | Admitting: Family Medicine

## 2022-10-21 DIAGNOSIS — F329 Major depressive disorder, single episode, unspecified: Secondary | ICD-10-CM

## 2022-10-21 DIAGNOSIS — Z7901 Long term (current) use of anticoagulants: Secondary | ICD-10-CM | POA: Diagnosis not present

## 2022-10-21 DIAGNOSIS — E7849 Other hyperlipidemia: Secondary | ICD-10-CM | POA: Diagnosis not present

## 2022-10-21 DIAGNOSIS — I429 Cardiomyopathy, unspecified: Secondary | ICD-10-CM | POA: Diagnosis not present

## 2022-10-21 DIAGNOSIS — I480 Paroxysmal atrial fibrillation: Secondary | ICD-10-CM | POA: Diagnosis not present

## 2022-10-21 DIAGNOSIS — Z8781 Personal history of (healed) traumatic fracture: Secondary | ICD-10-CM | POA: Diagnosis not present

## 2022-10-21 DIAGNOSIS — D8685 Sarcoid myocarditis: Secondary | ICD-10-CM | POA: Diagnosis not present

## 2022-10-21 DIAGNOSIS — E875 Hyperkalemia: Secondary | ICD-10-CM | POA: Diagnosis not present

## 2022-10-21 DIAGNOSIS — I5022 Chronic systolic (congestive) heart failure: Secondary | ICD-10-CM | POA: Diagnosis not present

## 2022-10-21 DIAGNOSIS — J301 Allergic rhinitis due to pollen: Secondary | ICD-10-CM

## 2022-10-21 DIAGNOSIS — N400 Enlarged prostate without lower urinary tract symptoms: Secondary | ICD-10-CM | POA: Diagnosis not present

## 2022-10-21 DIAGNOSIS — H9193 Unspecified hearing loss, bilateral: Secondary | ICD-10-CM | POA: Diagnosis not present

## 2022-10-21 DIAGNOSIS — Z87891 Personal history of nicotine dependence: Secondary | ICD-10-CM | POA: Diagnosis not present

## 2022-10-21 DIAGNOSIS — S81011D Laceration without foreign body, right knee, subsequent encounter: Secondary | ICD-10-CM | POA: Diagnosis not present

## 2022-10-21 DIAGNOSIS — Z86010 Personal history of colonic polyps: Secondary | ICD-10-CM | POA: Diagnosis not present

## 2022-10-21 DIAGNOSIS — J310 Chronic rhinitis: Secondary | ICD-10-CM

## 2022-10-21 DIAGNOSIS — E1122 Type 2 diabetes mellitus with diabetic chronic kidney disease: Secondary | ICD-10-CM | POA: Diagnosis not present

## 2022-10-21 DIAGNOSIS — E1169 Type 2 diabetes mellitus with other specified complication: Secondary | ICD-10-CM | POA: Diagnosis not present

## 2022-10-21 DIAGNOSIS — S51011D Laceration without foreign body of right elbow, subsequent encounter: Secondary | ICD-10-CM | POA: Diagnosis not present

## 2022-10-21 DIAGNOSIS — Z7984 Long term (current) use of oral hypoglycemic drugs: Secondary | ICD-10-CM | POA: Diagnosis not present

## 2022-10-21 DIAGNOSIS — F32A Depression, unspecified: Secondary | ICD-10-CM | POA: Diagnosis not present

## 2022-10-21 DIAGNOSIS — Z8673 Personal history of transient ischemic attack (TIA), and cerebral infarction without residual deficits: Secondary | ICD-10-CM | POA: Diagnosis not present

## 2022-10-21 DIAGNOSIS — N184 Chronic kidney disease, stage 4 (severe): Secondary | ICD-10-CM | POA: Diagnosis not present

## 2022-10-21 DIAGNOSIS — Z86018 Personal history of other benign neoplasm: Secondary | ICD-10-CM | POA: Diagnosis not present

## 2022-10-21 DIAGNOSIS — N183 Chronic kidney disease, stage 3 unspecified: Secondary | ICD-10-CM

## 2022-10-21 DIAGNOSIS — I130 Hypertensive heart and chronic kidney disease with heart failure and stage 1 through stage 4 chronic kidney disease, or unspecified chronic kidney disease: Secondary | ICD-10-CM | POA: Diagnosis not present

## 2022-10-21 DIAGNOSIS — D631 Anemia in chronic kidney disease: Secondary | ICD-10-CM | POA: Diagnosis not present

## 2022-10-27 DIAGNOSIS — Z8673 Personal history of transient ischemic attack (TIA), and cerebral infarction without residual deficits: Secondary | ICD-10-CM | POA: Diagnosis not present

## 2022-10-27 DIAGNOSIS — D8685 Sarcoid myocarditis: Secondary | ICD-10-CM | POA: Diagnosis not present

## 2022-10-27 DIAGNOSIS — Z7901 Long term (current) use of anticoagulants: Secondary | ICD-10-CM | POA: Diagnosis not present

## 2022-10-27 DIAGNOSIS — S51011D Laceration without foreign body of right elbow, subsequent encounter: Secondary | ICD-10-CM | POA: Diagnosis not present

## 2022-10-27 DIAGNOSIS — Z87891 Personal history of nicotine dependence: Secondary | ICD-10-CM | POA: Diagnosis not present

## 2022-10-27 DIAGNOSIS — I5022 Chronic systolic (congestive) heart failure: Secondary | ICD-10-CM | POA: Diagnosis not present

## 2022-10-27 DIAGNOSIS — E1169 Type 2 diabetes mellitus with other specified complication: Secondary | ICD-10-CM | POA: Diagnosis not present

## 2022-10-27 DIAGNOSIS — S81011D Laceration without foreign body, right knee, subsequent encounter: Secondary | ICD-10-CM | POA: Diagnosis not present

## 2022-10-27 DIAGNOSIS — N400 Enlarged prostate without lower urinary tract symptoms: Secondary | ICD-10-CM | POA: Diagnosis not present

## 2022-10-27 DIAGNOSIS — N184 Chronic kidney disease, stage 4 (severe): Secondary | ICD-10-CM | POA: Diagnosis not present

## 2022-10-27 DIAGNOSIS — Z8781 Personal history of (healed) traumatic fracture: Secondary | ICD-10-CM | POA: Diagnosis not present

## 2022-10-27 DIAGNOSIS — I130 Hypertensive heart and chronic kidney disease with heart failure and stage 1 through stage 4 chronic kidney disease, or unspecified chronic kidney disease: Secondary | ICD-10-CM | POA: Diagnosis not present

## 2022-10-27 DIAGNOSIS — Z86018 Personal history of other benign neoplasm: Secondary | ICD-10-CM | POA: Diagnosis not present

## 2022-10-27 DIAGNOSIS — E875 Hyperkalemia: Secondary | ICD-10-CM | POA: Diagnosis not present

## 2022-10-27 DIAGNOSIS — Z86010 Personal history of colonic polyps: Secondary | ICD-10-CM | POA: Diagnosis not present

## 2022-10-27 DIAGNOSIS — F32A Depression, unspecified: Secondary | ICD-10-CM | POA: Diagnosis not present

## 2022-10-27 DIAGNOSIS — I429 Cardiomyopathy, unspecified: Secondary | ICD-10-CM | POA: Diagnosis not present

## 2022-10-27 DIAGNOSIS — E7849 Other hyperlipidemia: Secondary | ICD-10-CM | POA: Diagnosis not present

## 2022-10-27 DIAGNOSIS — E1122 Type 2 diabetes mellitus with diabetic chronic kidney disease: Secondary | ICD-10-CM | POA: Diagnosis not present

## 2022-10-27 DIAGNOSIS — D631 Anemia in chronic kidney disease: Secondary | ICD-10-CM | POA: Diagnosis not present

## 2022-10-27 DIAGNOSIS — Z7984 Long term (current) use of oral hypoglycemic drugs: Secondary | ICD-10-CM | POA: Diagnosis not present

## 2022-10-27 DIAGNOSIS — H9193 Unspecified hearing loss, bilateral: Secondary | ICD-10-CM | POA: Diagnosis not present

## 2022-10-27 DIAGNOSIS — I480 Paroxysmal atrial fibrillation: Secondary | ICD-10-CM | POA: Diagnosis not present

## 2022-11-02 ENCOUNTER — Other Ambulatory Visit: Payer: Self-pay | Admitting: Family Medicine

## 2022-11-03 NOTE — Telephone Encounter (Signed)
Requested medication (s) are due for refill today:   Yes  Requested medication (s) are on the active medication list:   Yes  Future visit scheduled:   Yes 9/19   Last ordered: 07/28/2022 #90, 0 refills  Returned because labs are due so unable to refill    Requested Prescriptions  Pending Prescriptions Disp Refills   FEROSUL 325 (65 Fe) MG tablet [Pharmacy Med Name: FEROSUL TAB 325MG ] 90 tablet 0    Sig: TAKE (1) TABLET BY MOUTH EVERY DAY WITH BREAKFAST     Endocrinology:  Minerals - Iron Supplementation Failed - 11/02/2022  4:04 PM      Failed - HGB in normal range and within 360 days    Hemoglobin  Date Value Ref Range Status  03/27/2020 12.4 (L) 13.0 - 17.7 g/dL Final         Failed - HCT in normal range and within 360 days    Hematocrit  Date Value Ref Range Status  03/27/2020 36.1 (L) 37.5 - 51.0 % Final         Failed - RBC in normal range and within 360 days    RBC  Date Value Ref Range Status  03/27/2020 3.66 (L) 4.14 - 5.80 x10E6/uL Final  07/03/2019 3.35 (L) 4.22 - 5.81 MIL/uL Final         Failed - Fe (serum) in normal range and within 360 days    Iron  Date Value Ref Range Status  03/12/2019 77 38 - 169 ug/dL Final   Iron Saturation  Date Value Ref Range Status  03/12/2019 25 15 - 55 % Final         Failed - Ferritin in normal range and within 360 days    Ferritin  Date Value Ref Range Status  03/27/2020 67 30 - 400 ng/mL Final         Passed - Valid encounter within last 12 months    Recent Outpatient Visits           3 months ago Cellulitis of left upper extremity   [place] [facility], [facility]   5 months ago Vasovagal syncope   [place] [facility], MD   1 year ago Cardiomyopathy, unspecified type [facility])   [place] [facility], [facility]   1 year ago Essential hypertension   [facility], [facility]   1 year ago Type 2 diabetes mellitus without complication, without long-term current use of insulin [facility])    [facility], [facility]       Future Appointments             In 3 weeks Duanne Limerick, MD [facility] at [facility], [facility]

## 2022-11-04 DIAGNOSIS — I5022 Chronic systolic (congestive) heart failure: Secondary | ICD-10-CM | POA: Diagnosis not present

## 2022-11-04 DIAGNOSIS — D8685 Sarcoid myocarditis: Secondary | ICD-10-CM | POA: Diagnosis not present

## 2022-11-04 DIAGNOSIS — E1169 Type 2 diabetes mellitus with other specified complication: Secondary | ICD-10-CM | POA: Diagnosis not present

## 2022-11-04 DIAGNOSIS — Z8673 Personal history of transient ischemic attack (TIA), and cerebral infarction without residual deficits: Secondary | ICD-10-CM | POA: Diagnosis not present

## 2022-11-04 DIAGNOSIS — E7849 Other hyperlipidemia: Secondary | ICD-10-CM | POA: Diagnosis not present

## 2022-11-04 DIAGNOSIS — N400 Enlarged prostate without lower urinary tract symptoms: Secondary | ICD-10-CM | POA: Diagnosis not present

## 2022-11-04 DIAGNOSIS — I480 Paroxysmal atrial fibrillation: Secondary | ICD-10-CM | POA: Diagnosis not present

## 2022-11-04 DIAGNOSIS — Z87891 Personal history of nicotine dependence: Secondary | ICD-10-CM | POA: Diagnosis not present

## 2022-11-04 DIAGNOSIS — Z86018 Personal history of other benign neoplasm: Secondary | ICD-10-CM | POA: Diagnosis not present

## 2022-11-04 DIAGNOSIS — E1122 Type 2 diabetes mellitus with diabetic chronic kidney disease: Secondary | ICD-10-CM | POA: Diagnosis not present

## 2022-11-04 DIAGNOSIS — N184 Chronic kidney disease, stage 4 (severe): Secondary | ICD-10-CM | POA: Diagnosis not present

## 2022-11-04 DIAGNOSIS — S51011D Laceration without foreign body of right elbow, subsequent encounter: Secondary | ICD-10-CM | POA: Diagnosis not present

## 2022-11-04 DIAGNOSIS — Z7901 Long term (current) use of anticoagulants: Secondary | ICD-10-CM | POA: Diagnosis not present

## 2022-11-04 DIAGNOSIS — Z7984 Long term (current) use of oral hypoglycemic drugs: Secondary | ICD-10-CM | POA: Diagnosis not present

## 2022-11-04 DIAGNOSIS — H9193 Unspecified hearing loss, bilateral: Secondary | ICD-10-CM | POA: Diagnosis not present

## 2022-11-04 DIAGNOSIS — D631 Anemia in chronic kidney disease: Secondary | ICD-10-CM | POA: Diagnosis not present

## 2022-11-04 DIAGNOSIS — E875 Hyperkalemia: Secondary | ICD-10-CM | POA: Diagnosis not present

## 2022-11-04 DIAGNOSIS — I130 Hypertensive heart and chronic kidney disease with heart failure and stage 1 through stage 4 chronic kidney disease, or unspecified chronic kidney disease: Secondary | ICD-10-CM | POA: Diagnosis not present

## 2022-11-04 DIAGNOSIS — S81011D Laceration without foreign body, right knee, subsequent encounter: Secondary | ICD-10-CM | POA: Diagnosis not present

## 2022-11-04 DIAGNOSIS — Z86010 Personal history of colonic polyps: Secondary | ICD-10-CM | POA: Diagnosis not present

## 2022-11-04 DIAGNOSIS — I429 Cardiomyopathy, unspecified: Secondary | ICD-10-CM | POA: Diagnosis not present

## 2022-11-04 DIAGNOSIS — F32A Depression, unspecified: Secondary | ICD-10-CM | POA: Diagnosis not present

## 2022-11-04 DIAGNOSIS — Z8781 Personal history of (healed) traumatic fracture: Secondary | ICD-10-CM | POA: Diagnosis not present

## 2022-11-11 DIAGNOSIS — H6123 Impacted cerumen, bilateral: Secondary | ICD-10-CM | POA: Diagnosis not present

## 2022-11-11 DIAGNOSIS — H903 Sensorineural hearing loss, bilateral: Secondary | ICD-10-CM | POA: Diagnosis not present

## 2022-11-17 DIAGNOSIS — D045 Carcinoma in situ of skin of trunk: Secondary | ICD-10-CM | POA: Diagnosis not present

## 2022-11-17 DIAGNOSIS — L853 Xerosis cutis: Secondary | ICD-10-CM | POA: Diagnosis not present

## 2022-11-17 DIAGNOSIS — D485 Neoplasm of uncertain behavior of skin: Secondary | ICD-10-CM | POA: Diagnosis not present

## 2022-11-17 DIAGNOSIS — L821 Other seborrheic keratosis: Secondary | ICD-10-CM | POA: Diagnosis not present

## 2022-11-18 ENCOUNTER — Other Ambulatory Visit: Payer: Self-pay | Admitting: Family Medicine

## 2022-11-18 DIAGNOSIS — E7849 Other hyperlipidemia: Secondary | ICD-10-CM | POA: Diagnosis not present

## 2022-11-18 DIAGNOSIS — E1169 Type 2 diabetes mellitus with other specified complication: Secondary | ICD-10-CM | POA: Diagnosis not present

## 2022-11-18 DIAGNOSIS — Z8673 Personal history of transient ischemic attack (TIA), and cerebral infarction without residual deficits: Secondary | ICD-10-CM | POA: Diagnosis not present

## 2022-11-18 DIAGNOSIS — I480 Paroxysmal atrial fibrillation: Secondary | ICD-10-CM | POA: Diagnosis not present

## 2022-11-18 DIAGNOSIS — H9193 Unspecified hearing loss, bilateral: Secondary | ICD-10-CM | POA: Diagnosis not present

## 2022-11-18 DIAGNOSIS — I130 Hypertensive heart and chronic kidney disease with heart failure and stage 1 through stage 4 chronic kidney disease, or unspecified chronic kidney disease: Secondary | ICD-10-CM | POA: Diagnosis not present

## 2022-11-18 DIAGNOSIS — Z7901 Long term (current) use of anticoagulants: Secondary | ICD-10-CM | POA: Diagnosis not present

## 2022-11-18 DIAGNOSIS — D631 Anemia in chronic kidney disease: Secondary | ICD-10-CM | POA: Diagnosis not present

## 2022-11-18 DIAGNOSIS — F329 Major depressive disorder, single episode, unspecified: Secondary | ICD-10-CM

## 2022-11-18 DIAGNOSIS — I5022 Chronic systolic (congestive) heart failure: Secondary | ICD-10-CM | POA: Diagnosis not present

## 2022-11-18 DIAGNOSIS — Z7984 Long term (current) use of oral hypoglycemic drugs: Secondary | ICD-10-CM | POA: Diagnosis not present

## 2022-11-18 DIAGNOSIS — N184 Chronic kidney disease, stage 4 (severe): Secondary | ICD-10-CM | POA: Diagnosis not present

## 2022-11-18 DIAGNOSIS — Z86018 Personal history of other benign neoplasm: Secondary | ICD-10-CM | POA: Diagnosis not present

## 2022-11-18 DIAGNOSIS — S51011D Laceration without foreign body of right elbow, subsequent encounter: Secondary | ICD-10-CM | POA: Diagnosis not present

## 2022-11-18 DIAGNOSIS — D8685 Sarcoid myocarditis: Secondary | ICD-10-CM | POA: Diagnosis not present

## 2022-11-18 DIAGNOSIS — N183 Chronic kidney disease, stage 3 unspecified: Secondary | ICD-10-CM

## 2022-11-18 DIAGNOSIS — E119 Type 2 diabetes mellitus without complications: Secondary | ICD-10-CM

## 2022-11-18 DIAGNOSIS — N400 Enlarged prostate without lower urinary tract symptoms: Secondary | ICD-10-CM | POA: Diagnosis not present

## 2022-11-18 DIAGNOSIS — I429 Cardiomyopathy, unspecified: Secondary | ICD-10-CM | POA: Diagnosis not present

## 2022-11-18 DIAGNOSIS — Z8781 Personal history of (healed) traumatic fracture: Secondary | ICD-10-CM | POA: Diagnosis not present

## 2022-11-18 DIAGNOSIS — F32A Depression, unspecified: Secondary | ICD-10-CM | POA: Diagnosis not present

## 2022-11-18 DIAGNOSIS — Z86010 Personal history of colonic polyps: Secondary | ICD-10-CM | POA: Diagnosis not present

## 2022-11-18 DIAGNOSIS — Z87891 Personal history of nicotine dependence: Secondary | ICD-10-CM | POA: Diagnosis not present

## 2022-11-18 DIAGNOSIS — S81011D Laceration without foreign body, right knee, subsequent encounter: Secondary | ICD-10-CM | POA: Diagnosis not present

## 2022-11-18 DIAGNOSIS — E1122 Type 2 diabetes mellitus with diabetic chronic kidney disease: Secondary | ICD-10-CM | POA: Diagnosis not present

## 2022-11-18 DIAGNOSIS — E875 Hyperkalemia: Secondary | ICD-10-CM | POA: Diagnosis not present

## 2022-11-19 NOTE — Telephone Encounter (Signed)
Requested Prescriptions  Pending Prescriptions Disp Refills   losartan (COZAAR) 50 MG tablet [Pharmacy Med Name: LOSARTAN POT TAB 50MG ] 90 tablet 0    Sig: TAKE (1) TABLET BY MOUTH EVERY DAY     Cardiovascular:  Angiotensin Receptor Blockers Failed - 11/18/2022  3:23 PM      Failed - Cr in normal range and within 180 days    Creatinine, Ser  Date Value Ref Range Status  05/31/2022 2.58 (H) 0.76 - 1.27 mg/dL Final         Passed - K in normal range and within 180 days    Potassium  Date Value Ref Range Status  05/31/2022 5.2 3.5 - 5.2 mmol/L Final         Passed - Patient is not pregnant      Passed - Last BP in normal range    BP Readings from Last 1 Encounters:  07/09/22 126/62         Passed - Valid encounter within last 6 months    Recent Outpatient Visits           4 months ago Cellulitis of left upper extremity   [place] [facility], [facility]   5 months ago Vasovagal syncope   [place] [facility], MD   1 year ago Cardiomyopathy, unspecified type [facility])   [place] [facility], MD   1 year ago Essential hypertension   [place] [facility], MD   1 year ago Type 2 diabetes mellitus without complication, without long-term current use of insulin (HCC)   [place] [facility] at [facility], MD       Future Appointments             In 6 days Duanne Limerick, MD [facility] at [facility]             sertraline (ZOLOFT) 25 MG tablet [Pharmacy Med Name: SERTRALINE TAB 25MG ] 90 tablet 0    Sig: TAKE (1) TABLET BY MOUTH EVERY DAY     Psychiatry:  Antidepressants - SSRI - sertraline Failed - 11/18/2022  3:23 PM      Failed - AST in normal range and within 360 days     AST  Date Value Ref Range Status  06/01/2021 32 0 - 40 IU/L Final         Failed - ALT in normal range and within 360 days    ALT  Date Value Ref Range Status  06/01/2021 31 0 - 44 IU/L Final         Passed - Completed PHQ-2 or PHQ-9 in the last 360 days      Passed - Valid encounter within last 6 months    Recent Outpatient Visits           4 months ago Cellulitis of left upper extremity   [place] [facility], [facility]   5 months ago Vasovagal syncope   [facility] MD   1 year ago Cardiomyopathy, unspecified type [facility])   [facility] at [facility] [place]  C, MD   1 year ago Essential hypertension   [place] [facility], MD   1 year ago Type 2 diabetes mellitus without complication, without long-term current use of insulin (HCC)   [place] [facility], MD       Future Appointments             In 6 days Duanne Limerick, MD [facility] at [facility]             glipiZIDE (GLUCOTROL) 5 MG tablet [Pharmacy Med Name: GLIPIZIDE TAB 5MG ] 180 tablet 0    Sig: TAKE (1) TABLET BY MOUTH TWICE DAILY BEFORE MEALS.     Endocrinology:  Diabetes - Sulfonylureas Failed - 11/18/2022  3:23 PM      Failed - HBA1C is between 0 and 7.9 and within 180 days    Hemoglobin A1C  Date Value Ref Range Status  09/21/2021 6.7  Final         Failed - Cr in normal range and within 360 days    Creatinine, Ser  Date Value Ref Range Status  05/31/2022 2.58 (H) 0.76 - 1.27 mg/dL Final         Passed - Valid encounter within last 6 months    Recent Outpatient Visits           4 months ago Cellulitis of left upper extremity   [place] [facility]   5 months ago Vasovagal syncope   [place] [facility], MD   1 year ago Cardiomyopathy, unspecified type [facility])   [place] [facility], MD   1 year ago Essential hypertension   [place] [facility], MD   1 year ago Type 2 diabetes mellitus without complication, without long-term current use of insulin (HCC)   [place] [facility], MD       Future Appointments             In 6 days Duanne Limerick, MD [facility] at [facility], [facility]             simvastatin (ZOCOR) 80 MG tablet [Pharmacy Med Name: SIMVASTATIN TAB 80MG ] 90 tablet 0    Sig: TAKE 1 TABLET BY MOUTH EVERY EVENING     Cardiovascular:  Antilipid - Statins Failed - 11/18/2022  3:23 PM      Failed - Lipid Panel in normal range within the last 12 months    Cholesterol, Total  Date Value Ref Range Status  06/01/2021 146 100 - 199 mg/dL Final   LDL Chol Calc (NIH)  Date Value Ref Range Status  06/01/2021 73 0 - 99 mg/dL Final   HDL  Date Value Ref Range Status  06/01/2021 55 >39 mg/dL Final   Triglycerides  Date Value Ref Range Status  06/01/2021 98 0 - 149 mg/dL Final         Passed - Patient is not pregnant      Passed - Valid encounter within last 12 months    Recent Outpatient Visits  4 months ago Cellulitis of left upper extremity   [place] [facility] MD   5 months ago Vasovagal syncope   [place] [facility], MD   1 year ago Cardiomyopathy, unspecified type [facility])   [place] [facility], MD   1 year ago Essential  hypertension   [place] [facility], MD   1 year ago Type 2 diabetes mellitus without complication, without long-term current use of insulin [facility])   [place] [facility], MD       Future Appointments             In 6 days Duanne Limerick, MD [facility] at [facility], [facility]

## 2022-11-25 ENCOUNTER — Ambulatory Visit (INDEPENDENT_AMBULATORY_CARE_PROVIDER_SITE_OTHER): Payer: Medicare HMO | Admitting: Family Medicine

## 2022-11-25 ENCOUNTER — Encounter: Payer: Self-pay | Admitting: Family Medicine

## 2022-11-25 VITALS — BP 118/70 | HR 68 | Ht 68.0 in | Wt 190.0 lb

## 2022-11-25 DIAGNOSIS — E1169 Type 2 diabetes mellitus with other specified complication: Secondary | ICD-10-CM

## 2022-11-25 DIAGNOSIS — Z862 Personal history of diseases of the blood and blood-forming organs and certain disorders involving the immune mechanism: Secondary | ICD-10-CM

## 2022-11-25 DIAGNOSIS — F329 Major depressive disorder, single episode, unspecified: Secondary | ICD-10-CM | POA: Diagnosis not present

## 2022-11-25 DIAGNOSIS — S51011D Laceration without foreign body of right elbow, subsequent encounter: Secondary | ICD-10-CM | POA: Diagnosis not present

## 2022-11-25 DIAGNOSIS — J310 Chronic rhinitis: Secondary | ICD-10-CM

## 2022-11-25 DIAGNOSIS — I130 Hypertensive heart and chronic kidney disease with heart failure and stage 1 through stage 4 chronic kidney disease, or unspecified chronic kidney disease: Secondary | ICD-10-CM | POA: Diagnosis not present

## 2022-11-25 DIAGNOSIS — D631 Anemia in chronic kidney disease: Secondary | ICD-10-CM | POA: Diagnosis not present

## 2022-11-25 DIAGNOSIS — H9193 Unspecified hearing loss, bilateral: Secondary | ICD-10-CM | POA: Diagnosis not present

## 2022-11-25 DIAGNOSIS — N184 Chronic kidney disease, stage 4 (severe): Secondary | ICD-10-CM | POA: Diagnosis not present

## 2022-11-25 DIAGNOSIS — J301 Allergic rhinitis due to pollen: Secondary | ICD-10-CM

## 2022-11-25 DIAGNOSIS — Z7984 Long term (current) use of oral hypoglycemic drugs: Secondary | ICD-10-CM

## 2022-11-25 DIAGNOSIS — N183 Chronic kidney disease, stage 3 unspecified: Secondary | ICD-10-CM | POA: Diagnosis not present

## 2022-11-25 DIAGNOSIS — Z8781 Personal history of (healed) traumatic fracture: Secondary | ICD-10-CM | POA: Diagnosis not present

## 2022-11-25 DIAGNOSIS — Z23 Encounter for immunization: Secondary | ICD-10-CM | POA: Diagnosis not present

## 2022-11-25 DIAGNOSIS — N400 Enlarged prostate without lower urinary tract symptoms: Secondary | ICD-10-CM | POA: Diagnosis not present

## 2022-11-25 DIAGNOSIS — E875 Hyperkalemia: Secondary | ICD-10-CM | POA: Diagnosis not present

## 2022-11-25 DIAGNOSIS — E1122 Type 2 diabetes mellitus with diabetic chronic kidney disease: Secondary | ICD-10-CM | POA: Diagnosis not present

## 2022-11-25 DIAGNOSIS — I480 Paroxysmal atrial fibrillation: Secondary | ICD-10-CM | POA: Diagnosis not present

## 2022-11-25 DIAGNOSIS — I429 Cardiomyopathy, unspecified: Secondary | ICD-10-CM | POA: Diagnosis not present

## 2022-11-25 DIAGNOSIS — Z8673 Personal history of transient ischemic attack (TIA), and cerebral infarction without residual deficits: Secondary | ICD-10-CM | POA: Diagnosis not present

## 2022-11-25 DIAGNOSIS — E785 Hyperlipidemia, unspecified: Secondary | ICD-10-CM | POA: Diagnosis not present

## 2022-11-25 DIAGNOSIS — D8685 Sarcoid myocarditis: Secondary | ICD-10-CM | POA: Diagnosis not present

## 2022-11-25 DIAGNOSIS — Z86010 Personal history of colonic polyps: Secondary | ICD-10-CM | POA: Diagnosis not present

## 2022-11-25 DIAGNOSIS — I5022 Chronic systolic (congestive) heart failure: Secondary | ICD-10-CM | POA: Diagnosis not present

## 2022-11-25 DIAGNOSIS — F32A Depression, unspecified: Secondary | ICD-10-CM | POA: Diagnosis not present

## 2022-11-25 DIAGNOSIS — Z7901 Long term (current) use of anticoagulants: Secondary | ICD-10-CM | POA: Diagnosis not present

## 2022-11-25 DIAGNOSIS — Z86018 Personal history of other benign neoplasm: Secondary | ICD-10-CM | POA: Diagnosis not present

## 2022-11-25 DIAGNOSIS — Z87891 Personal history of nicotine dependence: Secondary | ICD-10-CM | POA: Diagnosis not present

## 2022-11-25 DIAGNOSIS — E7849 Other hyperlipidemia: Secondary | ICD-10-CM | POA: Diagnosis not present

## 2022-11-25 DIAGNOSIS — S81011D Laceration without foreign body, right knee, subsequent encounter: Secondary | ICD-10-CM | POA: Diagnosis not present

## 2022-11-25 MED ORDER — SERTRALINE HCL 25 MG PO TABS: 25.0000 mg | ORAL_TABLET | Freq: Every day | ORAL | 1 refills | Status: AC

## 2022-11-25 MED ORDER — MONTELUKAST SODIUM 10 MG PO TABS
10.0000 mg | ORAL_TABLET | Freq: Every day | ORAL | 1 refills | Status: AC
Start: 2022-11-25 — End: ?

## 2022-11-25 MED ORDER — LOSARTAN POTASSIUM 50 MG PO TABS
50.0000 mg | ORAL_TABLET | Freq: Every day | ORAL | 1 refills | Status: DC
Start: 2022-11-25 — End: 2023-01-14

## 2022-11-25 MED ORDER — FERROUS SULFATE 325 (65 FE) MG PO TABS
325.0000 mg | ORAL_TABLET | Freq: Every day | ORAL | 1 refills | Status: AC
Start: 2022-11-25 — End: ?

## 2022-11-25 MED ORDER — SIMVASTATIN 80 MG PO TABS
80.0000 mg | ORAL_TABLET | Freq: Every evening | ORAL | 1 refills | Status: DC
Start: 2022-11-25 — End: 2023-02-28

## 2022-11-25 NOTE — Progress Notes (Signed)
Date:  11/25/2022   Name:  Cody Howard   DOB:  07/28/1934   MRN:  [id number]   Chief Complaint: Anemia, Hypertension, Allergic Rhinitis , Depression, and Hyperlipidemia  Anemia Presents for follow-up visit. There has been no abdominal pain, light-headedness or palpitations. Signs of blood loss that are not present include hematemesis, hematochezia and melena. (minor scrotal injury)  There is no history of chronic renal disease, heart failure or hypothyroidism.  Hypertension This is a chronic problem. The current episode started more than 1 year ago. The problem has been gradually improving since onset. The problem is controlled. Pertinent negatives include no blurred vision, chest pain, headaches, neck pain, orthopnea, palpitations, PND, shortness of breath or sweats. There are no associated agents to hypertension. There are no known risk factors for coronary artery disease. Past treatments include angiotensin blockers. The current treatment provides moderate improvement. There are no compliance problems.  There is no history of angina, kidney disease, CAD/MI, CVA, heart failure, left ventricular hypertrophy, PVD or retinopathy. There is no history of chronic renal disease, a hypertension causing med or renovascular disease.  Depression        Associated symptoms include no decreased concentration, no fatigue, no helplessness, no hopelessness, does not have insomnia, not irritable, no restlessness, no decreased interest, no appetite change, no body aches, no myalgias, no headaches, no indigestion, not sad and no suicidal ideas.   Pertinent negatives include no hypothyroidism. Hyperlipidemia This is a chronic problem. The current episode started more than 1 year ago. The problem is controlled. Recent lipid tests were reviewed and are normal. He has no history of chronic renal disease, diabetes, hypothyroidism, liver disease, obesity or nephrotic syndrome. Pertinent negatives include no chest  pain, focal sensory loss, focal weakness, leg pain, myalgias or shortness of breath. Current antihyperlipidemic treatment includes statins. There are no compliance problems.     Lab Results  Component Value Date   NA 143 05/31/2022   K 5.2 05/31/2022   CO2 17 (L) 05/31/2022   GLUCOSE 156 (H) 05/31/2022   BUN 35 (H) 05/31/2022   CREATININE 2.58 (H) 05/31/2022   CALCIUM 8.3 (L) 05/31/2022   EGFR 24 (L) 05/31/2022   GFRNONAA 26 (L) 01/11/2020   Lab Results  Component Value Date   CHOL 146 06/01/2021   HDL 55 06/01/2021   LDLCALC 73 06/01/2021   TRIG 98 06/01/2021   CHOLHDL 2.3 11/29/2016   Lab Results  Component Value Date   TSH 2.710 05/31/2019   Lab Results  Component Value Date   HGBA1C 7.4 09/24/2022   Lab Results  Component Value Date   WBC 9.2 03/27/2020   HGB 12.4 (L) 03/27/2020   HCT 36.1 (L) 03/27/2020   MCV 99 (H) 03/27/2020   PLT 156 03/27/2020   Lab Results  Component Value Date   ALT 31 06/01/2021   AST 32 06/01/2021   ALKPHOS 91 06/01/2021   BILITOT 0.3 06/01/2021   No results found for: "25OHVITD2", "25OHVITD3", "VD25OH"   Review of Systems  Constitutional:  Negative for appetite change and fatigue.  HENT:  Negative for mouth sores and rhinorrhea.   Eyes:  Negative for blurred vision.  Respiratory:  Negative for shortness of breath.   Cardiovascular:  Negative for chest pain, palpitations, orthopnea and PND.  Gastrointestinal:  Negative for abdominal distention, abdominal pain, hematemesis, hematochezia and melena.  Musculoskeletal:  Negative for myalgias and neck pain.  Neurological:  Negative for focal weakness, light-headedness and headaches.  Psychiatric/Behavioral:  Positive for depression. Negative for decreased concentration and suicidal ideas. The patient does not have insomnia.     Patient Active Problem List   Diagnosis Date Noted   Acute rhinosinusitis 12/08/2020   Difficulty walking 04/24/2020   Sleeping difficulty 04/24/2020    Moderate mitral regurgitation 09/12/2019   Status post hip hemiarthroplasty 06/28/2019   Atrial fibrillation, chronic (HCC) 06/27/2019   Depression 06/27/2019   Fall at home, initial encounter 06/27/2019   Closed displaced fracture of left femoral neck (HCC) 06/27/2019   History of anemia 05/31/2019   Benign prostatic hyperplasia with lower urinary tract symptoms 05/31/2019   Cerebral atrophy, mild (HCC) 05/31/2019   Reactive depression 05/31/2019   Essential hypertension 05/31/2019   Hiatal hernia 05/31/2019   Cardiac syncope 02/20/2019   Loss of memory 05/25/2018   Dizziness 04/12/2018   TIA (transient ischemic attack) 01/25/2018   Chronic systolic CHF (congestive heart failure), NYHA class 3 (HCC) 11/29/2017   CKD (chronic kidney disease) stage 3, GFR 30-59 ml/min (HCC) 08/18/2017   Bradycardia 08/18/2017   Atrial flutter, paroxysmal (HCC) 07/21/2017   Functional dyspnea 07/06/2017   Cardiomyopathy (HCC) 07/06/2017   Hyperlipidemia, mixed 06/20/2017   Hyperlipidemia associated with type 2 diabetes mellitus (HCC) 04/06/2016   Type II diabetes mellitus with renal manifestations (HCC) 02/10/2015   Personal history of other diseases of male genital organs 02/10/2015   Sebaceous cyst 08/28/2014    No Known Allergies  Past Surgical History:  Procedure Laterality Date   COLONOSCOPY N/A 02/26/2015   Procedure: COLONOSCOPY;  Surgeon: Wallace Cullens, MD;  Location: [facility];  Service: Gastroenterology;  Laterality: N/A;   COLONOSCOPY WITH PROPOFOL N/A 11/02/2017   Procedure: COLONOSCOPY WITH PROPOFOL;  Surgeon: Toledo, Boykin Nearing, MD;  Location: [facility] END[facility];  Service: Gastroenterology;  Laterality: N/A;   ESOPHAGOGASTRODUODENOSCOPY N/A 02/26/2015   Procedure: ESOPHAGOGASTRODUODENOSCOPY (EGD);  Surgeon: Wallace Cullens, MD;  Location[facility];  Service: Gastroenterology;  Laterality: N/A;  Diabetic - oral meds   ESOPHAGOGASTRODUODENOSCOPY (EGD) WITH PROPOFOL N/A 11/02/2017    Procedure: ESOPHAGOGASTRODUODENOSCOPY (EGD) WITH PROPOFOL;  Surgeon: Toledo, Boykin Nearing, MD;  Location: [facility] ENDOSCOPY;  Service: Gastroenterology;  Laterality: N/A;   HIP ARTHROPLASTY Left 06/28/2019   Procedure: ARTHROPLASTY BIPOLAR HIP (HEMIARTHROPLASTY);  Surgeon: Christena Flake, MD;  Location: [facility] ORS;  Service: Orthopedics;  Laterality: Left;   KIDNEY SURGERY Right    surgery when 88 years old   POLYPECTOMY  02/26/2015   Procedure: POLYPECTOMY;  Surgeon: Wallace Cullens, MD;  Location: [facility];  Service: Gastroenterology;;   sebaceous cyst removal Right    located right of spine on upper back    Social History   Tobacco Use   Smoking status: Former    Current packs/day: 0.00    Types: Cigarettes    Quit date: 03/08/1978    Years since quitting: 44.7   Smokeless tobacco: Never  Vaping Use   Vaping status: Never Used  Substance Use Topics   Alcohol use: Not Currently   Drug use: Never     Medication list has been reviewed and updated.  Current Meds  Medication Sig   apixaban (ELIQUIS) 2.5 MG TABS tablet Take 2.5 mg by mouth 2 (two) times daily.   Calcium Citrate-Vitamin D (CITRACAL + D PO) Take 1 tablet by mouth daily.   Cholecalciferol 125 MCG (5000 UT) TABS Take 1 tablet (5,000 Units total) by mouth daily.   FEROSUL 325 (65 Fe) MG tablet TAKE (1) TABLET BY MOUTH  EVERY DAY WITH BREAKFAST   glipiZIDE (GLUCOTROL) 5 MG tablet TAKE (1) TABLET BY MOUTH TWICE DAILY BEFORE MEALS.   glucose blood (ONETOUCH ULTRA) test strip USE TO TEST ONCE A DAY   Lancets (ONETOUCH DELICA PLUS LANCET33G) MISC USE TO TEST ONCE DAILY   losartan (COZAAR) 50 MG tablet TAKE (1) TABLET BY MOUTH EVERY DAY   montelukast (SINGULAIR) 10 MG tablet TAKE 1 TABLET BY MOUTH EVERY DAY AT BEDTIME.   Probiotic Product (ALIGN) 4 MG CAPS Take 1 capsule (4 mg total) by mouth at bedtime.   sertraline (ZOLOFT) 25 MG tablet TAKE (1) TABLET BY MOUTH EVERY DAY   simvastatin (ZOCOR) 80 MG tablet TAKE 1 TABLET BY  MOUTH EVERY EVENING       11/25/2022    1:52 PM 07/09/2022   10:33 AM 05/31/2022    1:34 PM 10/01/2021    1:55 PM  GAD 7 : Generalized Anxiety Score  Nervous, Anxious, on Edge 0 0 0 0  Control/stop worrying 0 0 0 0  Worry too much - different things 0 0 0 0  Trouble relaxing 0 0 0 0  Restless 0 0 0 0  Easily annoyed or irritable 0 0 0 0  Afraid - awful might happen 0 0 0 0  Total GAD 7 Score 0 0 0 0  Anxiety Difficulty Not difficult at all Not difficult at all Not difficult at all Not difficult at all       11/25/2022    1:52 PM 07/09/2022   10:33 AM 05/31/2022    1:33 PM  Depression screen PHQ 2/9  Decreased Interest 0 0 0  Down, Depressed, Hopeless 0 0 0  PHQ - 2 Score 0 0 0  Altered sleeping 0 0 0  Tired, decreased energy 0 0 0  Change in appetite 0 0 0  Feeling bad or failure about yourself  0 0 0  Trouble concentrating 0 0 0  Moving slowly or fidgety/restless 0 0 0  Suicidal thoughts 0 0 0  PHQ-9 Score 0 0 0  Difficult doing work/chores Not difficult at all Not difficult at all Not difficult at all    BP Readings from Last 3 Encounters:  11/25/22 118/70  07/09/22 126/62  07/07/22 139/67    Physical Exam Vitals and nursing note reviewed.  Constitutional:      General: He is not irritable. HENT:     Head: Normocephalic.     Right Ear: Tympanic membrane and external ear normal.     Left Ear: Tympanic membrane and external ear normal.     Nose: Nose normal.  Eyes:     General: No scleral icterus.       Right eye: No discharge.        Left eye: No discharge.     Conjunctiva/sclera: Conjunctivae normal.     Pupils: Pupils are equal, round, and reactive to light.  Neck:     Thyroid: No thyromegaly.     Vascular: No JVD.     Trachea: No tracheal deviation.  Cardiovascular:     Rate and Rhythm: Normal rate and regular rhythm.     Heart sounds: Normal heart sounds. No murmur heard.    No friction rub. No gallop.  Pulmonary:     Effort: No respiratory distress.      Breath sounds: Normal breath sounds. No wheezing, rhonchi or rales.  Abdominal:     General: Bowel sounds are normal.     Palpations: Abdomen is soft.  There is no hepatomegaly, splenomegaly or mass.     Tenderness: There is no abdominal tenderness. There is no guarding or rebound.  Musculoskeletal:        General: No tenderness. Normal range of motion.     Cervical back: Normal range of motion and neck supple.  Lymphadenopathy:     Cervical: No cervical adenopathy.  Skin:    General: Skin is warm.     Findings: No rash.  Neurological:     Mental Status: He is alert and oriented to person, place, and time.     Cranial Nerves: No cranial nerve deficit.     Deep Tendon Reflexes: Reflexes are normal and symmetric.     Wt Readings from Last 3 Encounters:  11/25/22 190 lb (86.2 kg)  07/09/22 191 lb (86.6 kg)  07/07/22 191 lb (86.6 kg)    BP 118/70   Pulse 68   Ht 5\' 8"  (1.727 m)   Wt 190 lb (86.2 kg)   SpO2 98%   BMI 28.89 kg/m   Assessment and Plan: 1. CKD (chronic kidney disease) stage 3, GFR 30-59 ml/min (HCC) Chronic.  Controlled.  Stable.  Followed by Dr. Lourdes Sledge.  Continue losartan at current dosing and will check renal function panel. - losartan (COZAAR) 50 MG tablet; Take 1 tablet (50 mg total) by mouth daily.  Dispense: 90 tablet; Refill: 1 - Renal Function Panel  2. Cardiomyopathy, unspecified type (HCC) Chronic.  Controlled.  Stable.  Patient is having some dyspnea on exertion we will continue to monitor - ferrous sulfate (FEROSUL) 325 (65 FE) MG tablet; Take 1 tablet (325 mg total) by mouth daily with breakfast.  Dispense: 90 tablet; Refill: 1 - losartan (COZAAR) 50 MG tablet; Take 1 tablet (50 mg total) by mouth daily.  Dispense: 90 tablet; Refill: 1  3. Chronic rhinitis Chronic.  Controlled.  Stable.  Continue Singulair 10 mg once a day. - montelukast (SINGULAIR) 10 MG tablet; Take 1 tablet (10 mg total) by mouth at bedtime.  Dispense: 90 tablet; Refill:  1  4. Seasonal allergic rhinitis due to pollen As noted previous - montelukast (SINGULAIR) 10 MG tablet; Take 1 tablet (10 mg total) by mouth at bedtime.  Dispense: 90 tablet; Refill: 1  5. Reactive depression Chronic.  Controlled.  Stable.  PHQ is 0 GAD score 0 continue sertraline 25 mg once a day. - sertraline (ZOLOFT) 25 MG tablet; Take 1 tablet (25 mg total) by mouth daily.  Dispense: 90 tablet; Refill: 1  6. Hyperlipidemia associated with type 2 diabetes mellitus (HCC) Chronic.  Controlled.  Stable.  Continue simvastatin for 80 mg once a day. - simvastatin (ZOCOR) 80 MG tablet; Take 1 tablet (80 mg total) by mouth every evening.  Dispense: 90 tablet; Refill: 1  7. History of anemia Yes and was noted to have a hemoglobin in the 35 range.  Will check hemoglobin hematocrit for his current progression over the past 6 weeks in the meantime encouraged to continue ferrous sulfate 325 mg daily. - ferrous sulfate (FEROSUL) 325 (65 FE) MG tablet; Take 1 tablet (325 mg total) by mouth daily with breakfast.  Dispense: 90 tablet; Refill: 1 - Hemoglobin and hematocrit, blood     Elizabeth Sauer, MD

## 2022-11-26 LAB — RENAL FUNCTION PANEL
Albumin: 3.6 g/dL — ABNORMAL LOW (ref 3.7–4.7)
BUN/Creatinine Ratio: 14 (ref 10–24)
BUN: 41 mg/dL — ABNORMAL HIGH (ref 8–27)
CO2: 17 mmol/L — ABNORMAL LOW (ref 20–29)
Calcium: 8.7 mg/dL (ref 8.6–10.2)
Chloride: 108 mmol/L — ABNORMAL HIGH (ref 96–106)
Creatinine, Ser: 2.83 mg/dL — ABNORMAL HIGH (ref 0.76–1.27)
Glucose: 287 mg/dL — ABNORMAL HIGH (ref 70–99)
Phosphorus: 3.2 mg/dL (ref 2.8–4.1)
Potassium: 5.3 mmol/L — ABNORMAL HIGH (ref 3.5–5.2)
Sodium: 141 mmol/L (ref 134–144)
eGFR: 21 mL/min/{1.73_m2} — ABNORMAL LOW (ref >59)

## 2022-11-26 LAB — HEMOGLOBIN AND HEMATOCRIT, BLOOD
Hematocrit: 34 % — ABNORMAL LOW (ref 37.5–51.0)
Hemoglobin: 11.2 g/dL — ABNORMAL LOW (ref 13.0–17.7)

## 2022-11-30 DIAGNOSIS — E782 Mixed hyperlipidemia: Secondary | ICD-10-CM | POA: Diagnosis not present

## 2022-11-30 DIAGNOSIS — I5022 Chronic systolic (congestive) heart failure: Secondary | ICD-10-CM | POA: Diagnosis not present

## 2022-11-30 DIAGNOSIS — I4892 Unspecified atrial flutter: Secondary | ICD-10-CM | POA: Diagnosis not present

## 2022-12-01 ENCOUNTER — Encounter: Payer: Medicare HMO | Admitting: *Deleted

## 2022-12-02 ENCOUNTER — Ambulatory Visit: Payer: Self-pay | Admitting: *Deleted

## 2022-12-02 NOTE — Patient Outreach (Signed)
Care Coordination   Follow Up Visit Note   12/02/2022 Name: Cody Howard MRN: [id number] DOB: 18-Jun-1934  Cody Howard is a 88 y.o. year old male who sees Duanne Limerick, MD for primary care. I spoke with Cody Howard, daughter of Cody Howard by phone today.  What matters to the patients health and wellness today?  Discussed the progression of dementia with daughter.  State she is working with patient and family to help with management of care.     Goals Addressed             This Visit's Progress    No recurrent falls   On track    Care Coordination Interventions: Provided written and verbal education re: potential causes of falls and Fall prevention strategies Advised patient of importance of notifying provider of falls Assessed for falls since last encounter Advised patient to discuss Caregiver resources for patients with dementia with provider Discussed participation with home health PT/OT for balance and strength, PT sessions completed         SDOH assessments and interventions completed:  No     Care Coordination Interventions:  Yes, provided   Interventions Today    Flowsheet Row Most Recent Value  Chronic Disease   Chronic disease during today's visit Other  [dementia and falls]  General Interventions   General Interventions Discussed/Reviewed General Interventions Reviewed, Doctor Visits, Durable Medical Equipment (DME)  Doctor Visits Discussed/Reviewed Doctor Visits Reviewed, Specialist  [nephrology 10/9]  Durable Medical Equipment (DME) Dan Humphreys  PCP/Specialist Visits Compliance with follow-up visit  Education Interventions   Education Provided Provided Education  Provided Verbal Education On When to see the doctor  Safety Interventions   Safety Discussed/Reviewed Fall Risk, Safety Reviewed  [Life alert system now in place]       Follow up plan: Follow up call scheduled for 11/15    Encounter Outcome:  Patient Visit Completed   Kemper Durie, RN, MSN, [facility] Care Management Care Management Coordinator [phone]

## 2022-12-06 DIAGNOSIS — D045 Carcinoma in situ of skin of trunk: Secondary | ICD-10-CM | POA: Diagnosis not present

## 2022-12-07 ENCOUNTER — Other Ambulatory Visit: Payer: Self-pay | Admitting: Family Medicine

## 2022-12-07 ENCOUNTER — Telehealth: Payer: Self-pay | Admitting: Family Medicine

## 2022-12-07 DIAGNOSIS — B379 Candidiasis, unspecified: Secondary | ICD-10-CM

## 2022-12-07 DIAGNOSIS — F0280 Dementia in other diseases classified elsewhere without behavioral disturbance: Secondary | ICD-10-CM | POA: Diagnosis present

## 2022-12-07 MED ORDER — NYSTATIN 100000 UNIT/GM EX CREA
1.0000 | TOPICAL_CREAM | Freq: Two times a day (BID) | CUTANEOUS | 0 refills | Status: DC
Start: 2022-12-07 — End: 2023-02-21

## 2022-12-07 NOTE — Telephone Encounter (Signed)
erroneous

## 2022-12-07 NOTE — Telephone Encounter (Signed)
Discussed with patient's spouse that he has erythema of the scrotal area.  I told him that this may be candidiasis in nature and I will send in some nystatin cream to be used twice a day.  If he continues to have erythema and the fact that he has diabetes patient would need to come in and be rechecked later in the week.

## 2022-12-23 DIAGNOSIS — R809 Proteinuria, unspecified: Secondary | ICD-10-CM | POA: Diagnosis not present

## 2022-12-23 DIAGNOSIS — N2581 Secondary hyperparathyroidism of renal origin: Secondary | ICD-10-CM | POA: Diagnosis not present

## 2022-12-23 DIAGNOSIS — I10 Essential (primary) hypertension: Secondary | ICD-10-CM | POA: Diagnosis not present

## 2022-12-23 DIAGNOSIS — D631 Anemia in chronic kidney disease: Secondary | ICD-10-CM | POA: Diagnosis not present

## 2022-12-23 DIAGNOSIS — E1122 Type 2 diabetes mellitus with diabetic chronic kidney disease: Secondary | ICD-10-CM | POA: Diagnosis not present

## 2022-12-23 DIAGNOSIS — N184 Chronic kidney disease, stage 4 (severe): Secondary | ICD-10-CM | POA: Diagnosis not present

## 2022-12-31 ENCOUNTER — Ambulatory Visit (INDEPENDENT_AMBULATORY_CARE_PROVIDER_SITE_OTHER): Payer: Medicare HMO | Admitting: Family Medicine

## 2022-12-31 ENCOUNTER — Encounter: Payer: Self-pay | Admitting: Family Medicine

## 2022-12-31 VITALS — BP 110/56 | HR 76 | Ht 68.0 in | Wt 191.0 lb

## 2022-12-31 DIAGNOSIS — N492 Inflammatory disorders of scrotum: Secondary | ICD-10-CM

## 2022-12-31 MED ORDER — SULFAMETHOXAZOLE-TRIMETHOPRIM 800-160 MG PO TABS: 1.0000 | ORAL_TABLET | Freq: Two times a day (BID) | ORAL | 0 refills | Status: DC

## 2022-12-31 MED ORDER — CHLORHEXIDINE GLUCONATE 4 % EX SOLN: Freq: Every day | CUTANEOUS | Status: DC | PRN

## 2022-12-31 MED ORDER — MUPIROCIN 2 % EX OINT: 1.0000 | TOPICAL_OINTMENT | Freq: Two times a day (BID) | CUTANEOUS | 0 refills | Status: AC

## 2022-12-31 NOTE — Progress Notes (Signed)
Date:  12/31/2022   Name:  Cody Howard   DOB:  03-11-1934   MRN:  [id number]   Chief Complaint: Groin Pain  Groin Pain The patient's primary symptoms include scrotal swelling. The patient's pertinent negatives include no genital injury, genital itching, genital lesions, pelvic pain, penile discharge, penile pain, priapism or testicular pain. This is a chronic problem. The current episode started more than 1 year ago. The problem occurs every several days. The problem has been gradually improving. Pertinent negatives include no abdominal pain, anorexia, chest pain, chills, constipation, coughing, diarrhea, discolored urine, dysuria, fever, flank pain, frequency, headaches, hematuria, hesitancy, joint pain, joint swelling, nausea, painful intercourse, rash, shortness of breath, sore throat, urgency, urinary retention or vomiting.    Lab Results  Component Value Date   NA 141 11/25/2022   K 5.3 (H) 11/25/2022   CO2 17 (L) 11/25/2022   GLUCOSE 287 (H) 11/25/2022   BUN 41 (H) 11/25/2022   CREATININE 2.83 (H) 11/25/2022   CALCIUM 8.7 11/25/2022   EGFR 21 (L) 11/25/2022   GFRNONAA 26 (L) 01/11/2020   Lab Results  Component Value Date   CHOL 146 06/01/2021   HDL 55 06/01/2021   LDLCALC 73 06/01/2021   TRIG 98 06/01/2021   CHOLHDL 2.3 11/29/2016   Lab Results  Component Value Date   TSH 2.710 05/31/2019   Lab Results  Component Value Date   HGBA1C 7.4 09/24/2022   Lab Results  Component Value Date   WBC 9.2 03/27/2020   HGB 11.2 (L) 11/25/2022   HCT 34.0 (L) 11/25/2022   MCV 99 (H) 03/27/2020   PLT 156 03/27/2020   Lab Results  Component Value Date   ALT 31 06/01/2021   AST 32 06/01/2021   ALKPHOS 91 06/01/2021   BILITOT 0.3 06/01/2021   No results found for: "25OHVITD2", "25OHVITD3", "VD25OH"   Review of Systems  Constitutional:  Negative for chills and fever.  HENT:  Negative for sore throat.   Respiratory:  Negative for cough, choking and shortness of  breath.   Cardiovascular:  Negative for chest pain.  Gastrointestinal:  Negative for abdominal pain, anorexia, constipation, diarrhea, nausea and vomiting.  Genitourinary:  Positive for scrotal swelling. Negative for dysuria, flank pain, frequency, hesitancy, pelvic pain, penile discharge, penile pain, testicular pain and urgency.  Musculoskeletal:  Negative for joint pain.  Skin:  Negative for rash.  Neurological:  Negative for headaches.    Patient Active Problem List   Diagnosis Date Noted   Acute rhinosinusitis 12/08/2020   Difficulty walking 04/24/2020   Sleeping difficulty 04/24/2020   Moderate mitral regurgitation 09/12/2019   Status post hip hemiarthroplasty 06/28/2019   Atrial fibrillation, chronic (HCC) 06/27/2019   Depression 06/27/2019   Fall at home, initial encounter 06/27/2019   Closed displaced fracture of left femoral neck (HCC) 06/27/2019   History of anemia 05/31/2019   Benign prostatic hyperplasia with lower urinary tract symptoms 05/31/2019   Cerebral atrophy, mild (HCC) 05/31/2019   Reactive depression 05/31/2019   Essential hypertension 05/31/2019   Hiatal hernia 05/31/2019   Cardiac syncope 02/20/2019   Loss of memory 05/25/2018   Dizziness 04/12/2018   TIA (transient ischemic attack) 01/25/2018   Chronic systolic CHF (congestive heart failure), NYHA class 3 (HCC) 11/29/2017   CKD (chronic kidney disease) stage 3, GFR 30-59 ml/min (HCC) 08/18/2017   Bradycardia 08/18/2017   Atrial flutter, paroxysmal (HCC) 07/21/2017   Functional dyspnea 07/06/2017   Cardiomyopathy (HCC) 07/06/2017   Hyperlipidemia, mixed 06/20/2017  Hyperlipidemia associated with type 2 diabetes mellitus (HCC) 04/06/2016   Type II diabetes mellitus with renal manifestations (HCC) 02/10/2015   Personal history of other diseases of male genital organs 02/10/2015   Sebaceous cyst 08/28/2014    No Known Allergies  Past Surgical History:  Procedure Laterality Date   COLONOSCOPY N/A  02/26/2015   Procedure: COLONOSCOPY;  Surgeon: Wallace Cullens, MD;  Location: [facility];  Service: Gastroenterology;  Laterality: N/A;   COLONOSCOPY WITH PROPOFOL N/A 11/02/2017   Procedure: COLONOSCOPY WITH PROPOFOL;  Surgeon: Toledo, Boykin Nearing, MD;  Location: [facility];  Service: Gastroenterology;  Laterality: N/A;   ESOPHAGOGASTRODUODENOSCOPY N/A 02/26/2015   Procedure: ESOPHAGOGASTRODUODENOSCOPY (EGD);  Surgeon: Wallace Cullens, MD;  Location: [facility];  Service: Gastroenterology;  Laterality: N/A;  Diabetic - oral meds   ESOPHAGOGASTRODUODENOSCOPY (EGD) WITH PROPOFOL N/A 11/02/2017   Procedure: ESOPHAGOGASTRODUODENOSCOPY (EGD) WITH PROPOFOL;  Surgeon: Toledo, Boykin Nearing, MD;  Location: [facility];  Service: Gastroenterology;  Laterality: N/A;   HIP ARTHROPLASTY Left 06/28/2019   Procedure: ARTHROPLASTY BIPOLAR HIP (HEMIARTHROPLASTY);  Surgeon: Christena Flake, MD;  Location: [facility];  Service: Orthopedics;  Laterality: Left;   KIDNEY SURGERY Right    surgery when 88 years old   POLYPECTOMY  02/26/2015   Procedure: POLYPECTOMY;  Surgeon: Wallace Cullens, MD;  Location: [facility];  Service: Gastroenterology;;   sebaceous cyst removal Right    located right of spine on upper back    Social History   Tobacco Use   Smoking status: Former    Current packs/day: 0.00    Types: Cigarettes    Quit date: 03/08/1978    Years since quitting: 44.8   Smokeless tobacco: Never  Vaping Use   Vaping status: Never Used  Substance Use Topics   Alcohol use: Not Currently   Drug use: Never     Medication list has been reviewed and updated.  Current Meds  Medication Sig   apixaban (ELIQUIS) 2.5 MG TABS tablet Take 2.5 mg by mouth 2 (two) times daily.   Calcium Citrate-Vitamin D (CITRACAL + D PO) Take 1 tablet by mouth daily.   Cholecalciferol 125 MCG (5000 UT) TABS Take 1 tablet (5,000 Units total) by mouth daily.   ferrous sulfate (FEROSUL) 325 (65 FE) MG tablet Take 1 tablet  (325 mg total) by mouth daily with breakfast.   glipiZIDE (GLUCOTROL) 5 MG tablet TAKE (1) TABLET BY MOUTH TWICE DAILY BEFORE MEALS.   glucose blood (ONETOUCH ULTRA) test strip USE TO TEST ONCE A DAY   Lancets (ONETOUCH DELICA PLUS LANCET33G) MISC USE TO TEST ONCE DAILY   losartan (COZAAR) 50 MG tablet Take 1 tablet (50 mg total) by mouth daily.   montelukast (SINGULAIR) 10 MG tablet Take 1 tablet (10 mg total) by mouth at bedtime.   nystatin cream (MYCOSTATIN) Apply 1 Application topically 2 (two) times daily.   Probiotic Product (ALIGN) 4 MG CAPS Take 1 capsule (4 mg total) by mouth at bedtime.   sertraline (ZOLOFT) 25 MG tablet Take 1 tablet (25 mg total) by mouth daily.   simvastatin (ZOCOR) 80 MG tablet Take 1 tablet (80 mg total) by mouth every evening.   [DISCONTINUED] ibuprofen (ADVIL) 200 MG tablet Take 200 mg by mouth every 6 (six) hours as needed.       12/31/2022    4:10 PM 11/25/2022    1:52 PM 07/09/2022   10:33 AM 05/31/2022    1:34 PM  GAD 7 : Generalized Anxiety  Score  Nervous, Anxious, on Edge 0 0 0 0  Control/stop worrying 0 0 0 0  Worry too much - different things 0 0 0 0  Trouble relaxing 0 0 0 0  Restless 0 0 0 0  Easily annoyed or irritable 0 0 0 0  Afraid - awful might happen 0 0 0 0  Total GAD 7 Score 0 0 0 0  Anxiety Difficulty Not difficult at all Not difficult at all Not difficult at all Not difficult at all       12/31/2022    4:10 PM 11/25/2022    1:52 PM 07/09/2022   10:33 AM  Depression screen PHQ 2/9  Decreased Interest 0 0 0  Down, Depressed, Hopeless 0 0 0  PHQ - 2 Score 0 0 0  Altered sleeping 0 0 0  Tired, decreased energy 0 0 0  Change in appetite 0 0 0  Feeling bad or failure about yourself  0 0 0  Trouble concentrating 0 0 0  Moving slowly or fidgety/restless 0 0 0  Suicidal thoughts 0 0 0  PHQ-9 Score 0 0 0  Difficult doing work/chores Not difficult at all Not difficult at all Not difficult at all    BP Readings from Last 3  Encounters:  12/31/22 (!) 110/56  11/25/22 118/70  07/09/22 126/62    Physical Exam Vitals and nursing note reviewed.  HENT:     Head: Normocephalic.     Right Ear: External ear normal.     Left Ear: External ear normal.     Nose: Nose normal. No rhinorrhea.  Eyes:     General: No scleral icterus.       Right eye: No discharge.        Left eye: No discharge.     Conjunctiva/sclera: Conjunctivae normal.     Pupils: Pupils are equal, round, and reactive to light.  Neck:     Thyroid: No thyromegaly.     Vascular: No JVD.     Trachea: No tracheal deviation.  Cardiovascular:     Rate and Rhythm: Normal rate and regular rhythm.     Heart sounds: Normal heart sounds. No murmur heard.    No friction rub. No gallop.  Pulmonary:     Effort: No respiratory distress.     Breath sounds: Normal breath sounds. No wheezing, rhonchi or rales.  Abdominal:     General: Bowel sounds are normal.     Palpations: Abdomen is soft. There is no mass.     Tenderness: There is no abdominal tenderness. There is no guarding or rebound.  Musculoskeletal:        General: No tenderness. Normal range of motion.     Cervical back: Normal range of motion and neck supple.  Lymphadenopathy:     Cervical: No cervical adenopathy.  Skin:    General: Skin is warm.     Findings: No rash.  Neurological:     Mental Status: He is alert.     Wt Readings from Last 3 Encounters:  12/31/22 191 lb (86.6 kg)  11/25/22 190 lb (86.2 kg)  07/09/22 191 lb (86.6 kg)    BP (!) 110/56   Pulse 76   Ht 5\' 8"  (1.727 m)   Wt 191 lb (86.6 kg)   SpO2 95%   BMI 29.04 kg/m   Assessment and Plan: 1. Cellulitis, scrotum New onset.  Persistent.  Patient has had a breakout of his scrotal area that is probably a  combination of diaper rash, candidiasis, and secondary bacterial infection.  Have instructed patient to clean with Hibiclens in the shower situation 2-3 times a week and we need to initiate Septra DS twice a day for  10 days and the application of Bactroban at least once a day and preferably twice a day. - sulfamethoxazole-trimethoprim (BACTRIM DS) 800-160 MG tablet; Take 1 tablet by mouth 2 (two) times daily for 10 days.  Dispense: 20 tablet; Refill: 0 - mupirocin ointment (BACTROBAN) 2 %; Apply 1 Application topically 2 (two) times daily.  Dispense: 22 g; Refill: 0     Elizabeth Sauer, MD

## 2023-01-08 ENCOUNTER — Emergency Department: Payer: Medicare HMO

## 2023-01-08 ENCOUNTER — Other Ambulatory Visit: Payer: Self-pay

## 2023-01-08 ENCOUNTER — Observation Stay
Admission: EM | Admit: 2023-01-08 | Discharge: 2023-01-14 | Disposition: A | Discharge status: Skilled Nursing Facility | Payer: Medicare HMO | Attending: Obstetrics and Gynecology | Admitting: Obstetrics and Gynecology

## 2023-01-08 DIAGNOSIS — Z96642 Presence of left artificial hip joint: Secondary | ICD-10-CM | POA: Insufficient documentation

## 2023-01-08 DIAGNOSIS — Z87891 Personal history of nicotine dependence: Secondary | ICD-10-CM | POA: Diagnosis not present

## 2023-01-08 DIAGNOSIS — Z7984 Long term (current) use of oral hypoglycemic drugs: Secondary | ICD-10-CM | POA: Diagnosis not present

## 2023-01-08 DIAGNOSIS — E871 Hypo-osmolality and hyponatremia: Secondary | ICD-10-CM | POA: Insufficient documentation

## 2023-01-08 DIAGNOSIS — N179 Acute kidney failure, unspecified: Principal | ICD-10-CM

## 2023-01-08 DIAGNOSIS — R531 Weakness: Secondary | ICD-10-CM

## 2023-01-08 DIAGNOSIS — R9082 White matter disease, unspecified: Secondary | ICD-10-CM | POA: Diagnosis not present

## 2023-01-08 DIAGNOSIS — N189 Chronic kidney disease, unspecified: Principal | ICD-10-CM

## 2023-01-08 DIAGNOSIS — M7732 Calcaneal spur, left foot: Secondary | ICD-10-CM | POA: Diagnosis not present

## 2023-01-08 DIAGNOSIS — E875 Hyperkalemia: Secondary | ICD-10-CM | POA: Diagnosis not present

## 2023-01-08 DIAGNOSIS — M47812 Spondylosis without myelopathy or radiculopathy, cervical region: Secondary | ICD-10-CM | POA: Diagnosis not present

## 2023-01-08 DIAGNOSIS — I482 Chronic atrial fibrillation, unspecified: Secondary | ICD-10-CM | POA: Insufficient documentation

## 2023-01-08 DIAGNOSIS — M79672 Pain in left foot: Secondary | ICD-10-CM | POA: Diagnosis not present

## 2023-01-08 DIAGNOSIS — E1122 Type 2 diabetes mellitus with diabetic chronic kidney disease: Secondary | ICD-10-CM | POA: Diagnosis not present

## 2023-01-08 DIAGNOSIS — W19XXXA Unspecified fall, initial encounter: Secondary | ICD-10-CM | POA: Diagnosis not present

## 2023-01-08 DIAGNOSIS — S199XXA Unspecified injury of neck, initial encounter: Secondary | ICD-10-CM | POA: Diagnosis not present

## 2023-01-08 DIAGNOSIS — Z743 Need for continuous supervision: Secondary | ICD-10-CM | POA: Diagnosis not present

## 2023-01-08 DIAGNOSIS — Z7901 Long term (current) use of anticoagulants: Secondary | ICD-10-CM | POA: Insufficient documentation

## 2023-01-08 DIAGNOSIS — Z79899 Other long term (current) drug therapy: Secondary | ICD-10-CM | POA: Diagnosis not present

## 2023-01-08 DIAGNOSIS — E1110 Type 2 diabetes mellitus with ketoacidosis without coma: Secondary | ICD-10-CM | POA: Diagnosis not present

## 2023-01-08 DIAGNOSIS — E872 Acidosis, unspecified: Secondary | ICD-10-CM | POA: Insufficient documentation

## 2023-01-08 DIAGNOSIS — I129 Hypertensive chronic kidney disease with stage 1 through stage 4 chronic kidney disease, or unspecified chronic kidney disease: Secondary | ICD-10-CM | POA: Diagnosis not present

## 2023-01-08 DIAGNOSIS — M85872 Other specified disorders of bone density and structure, left ankle and foot: Secondary | ICD-10-CM | POA: Diagnosis not present

## 2023-01-08 DIAGNOSIS — N184 Chronic kidney disease, stage 4 (severe): Secondary | ICD-10-CM | POA: Insufficient documentation

## 2023-01-08 DIAGNOSIS — Z043 Encounter for examination and observation following other accident: Secondary | ICD-10-CM | POA: Diagnosis not present

## 2023-01-08 DIAGNOSIS — M50221 Other cervical disc displacement at C4-C5 level: Secondary | ICD-10-CM | POA: Diagnosis not present

## 2023-01-08 DIAGNOSIS — I10 Essential (primary) hypertension: Secondary | ICD-10-CM | POA: Diagnosis not present

## 2023-01-08 DIAGNOSIS — R0689 Other abnormalities of breathing: Secondary | ICD-10-CM | POA: Diagnosis not present

## 2023-01-08 DIAGNOSIS — D631 Anemia in chronic kidney disease: Secondary | ICD-10-CM | POA: Diagnosis not present

## 2023-01-08 DIAGNOSIS — M4802 Spinal stenosis, cervical region: Secondary | ICD-10-CM | POA: Diagnosis not present

## 2023-01-08 DIAGNOSIS — E1129 Type 2 diabetes mellitus with other diabetic kidney complication: Secondary | ICD-10-CM | POA: Diagnosis present

## 2023-01-08 DIAGNOSIS — M19072 Primary osteoarthritis, left ankle and foot: Secondary | ICD-10-CM | POA: Diagnosis not present

## 2023-01-08 LAB — URINALYSIS, ROUTINE W REFLEX MICROSCOPIC
Bacteria, UA: NONE SEEN
Bilirubin Urine: NEGATIVE
Glucose, UA: NEGATIVE mg/dL
Ketones, ur: NEGATIVE mg/dL
Leukocytes,Ua: NEGATIVE
Nitrite: NEGATIVE
Protein, ur: >=300 mg/dL — AB
Specific Gravity, Urine: 1.014 (ref 1.005–1.030)
Squamous Epithelial / HPF: 0 /[HPF] (ref 0–5)
pH: 5 (ref 5.0–8.0)

## 2023-01-08 LAB — BASIC METABOLIC PANEL
Anion gap: 9 (ref 5–15)
BUN: 42 mg/dL — ABNORMAL HIGH (ref 8–23)
CO2: 15 mmol/L — ABNORMAL LOW (ref 22–32)
Calcium: 8.4 mg/dL — ABNORMAL LOW (ref 8.9–10.3)
Chloride: 110 mmol/L (ref 98–111)
Creatinine, Ser: 3.31 mg/dL — ABNORMAL HIGH (ref 0.61–1.24)
GFR, Estimated: 18 mL/min — ABNORMAL LOW (ref >60)
Glucose, Bld: 146 mg/dL — ABNORMAL HIGH (ref 70–99)
Potassium: 5 mmol/L (ref 3.5–5.1)
Sodium: 134 mmol/L — ABNORMAL LOW (ref 135–145)

## 2023-01-08 LAB — CBC
HCT: 34.7 % — ABNORMAL LOW (ref 39.0–52.0)
Hemoglobin: 11.8 g/dL — ABNORMAL LOW (ref 13.0–17.0)
MCH: 34.8 pg — ABNORMAL HIGH (ref 26.0–34.0)
MCHC: 34 g/dL (ref 30.0–36.0)
MCV: 102.4 fL — ABNORMAL HIGH (ref 80.0–100.0)
Platelets: 158 10*3/uL (ref 150–400)
RBC: 3.39 MIL/uL — ABNORMAL LOW (ref 4.22–5.81)
RDW: 13.5 % (ref 11.5–15.5)
WBC: 10.8 10*3/uL — ABNORMAL HIGH (ref 4.0–10.5)
nRBC: 0 % (ref 0.0–0.2)

## 2023-01-08 LAB — CBG MONITORING, ED: Glucose-Capillary: 203 mg/dL — ABNORMAL HIGH (ref 70–99)

## 2023-01-08 MED ORDER — ONDANSETRON HCL 4 MG/2ML IJ SOLN: 4.0000 mg | Freq: Four times a day (QID) | INTRAMUSCULAR | Status: DC | PRN

## 2023-01-08 MED ORDER — ACETAMINOPHEN 325 MG PO TABS: 650.0000 mg | ORAL_TABLET | Freq: Four times a day (QID) | ORAL | Status: DC | PRN

## 2023-01-08 MED ORDER — SODIUM CHLORIDE 0.9 % IV SOLN: INTRAVENOUS | Status: DC

## 2023-01-08 MED ORDER — MUPIROCIN 2 % EX OINT
1.0000 | TOPICAL_OINTMENT | Freq: Two times a day (BID) | CUTANEOUS | Status: DC
  Administered 2023-01-09 – 2023-01-14 (×9): 1 via TOPICAL
  Filled 2023-01-08 (×3): qty 22

## 2023-01-08 MED ORDER — RISAQUAD PO CAPS
1.0000 | ORAL_CAPSULE | Freq: Every day | ORAL | Status: DC
  Administered 2023-01-08 – 2023-01-13 (×6): 1 via ORAL
  Filled 2023-01-08 (×6): qty 1

## 2023-01-08 MED ORDER — FERROUS SULFATE 325 (65 FE) MG PO TABS
325.0000 mg | ORAL_TABLET | Freq: Every day | ORAL | Status: DC
  Administered 2023-01-09 – 2023-01-14 (×6): 325 mg via ORAL
  Filled 2023-01-08 (×6): qty 1

## 2023-01-08 MED ORDER — APIXABAN 2.5 MG PO TABS
2.5000 mg | ORAL_TABLET | Freq: Two times a day (BID) | ORAL | Status: DC
  Administered 2023-01-08 – 2023-01-14 (×12): 2.5 mg via ORAL
  Filled 2023-01-08 (×12): qty 1

## 2023-01-08 MED ORDER — HYDRALAZINE HCL 10 MG PO TABS
10.0000 mg | ORAL_TABLET | Freq: Four times a day (QID) | ORAL | Status: DC | PRN
  Administered 2023-01-10: 10 mg via ORAL
  Filled 2023-01-08: qty 1

## 2023-01-08 MED ORDER — DOXYCYCLINE HYCLATE 100 MG PO TABS
100.0000 mg | ORAL_TABLET | Freq: Two times a day (BID) | ORAL | Status: AC
  Administered 2023-01-08 – 2023-01-10 (×6): 100 mg via ORAL
  Filled 2023-01-08 (×6): qty 1

## 2023-01-08 MED ORDER — MONTELUKAST SODIUM 10 MG PO TABS
10.0000 mg | ORAL_TABLET | Freq: Every day | ORAL | Status: DC
  Administered 2023-01-08 – 2023-01-13 (×6): 10 mg via ORAL
  Filled 2023-01-08 (×6): qty 1

## 2023-01-08 MED ORDER — SODIUM CHLORIDE 0.9 % IV BOLUS
500.0000 mL | Freq: Once | INTRAVENOUS | Status: AC
  Administered 2023-01-08: 500 mL via INTRAVENOUS

## 2023-01-08 MED ORDER — SODIUM BICARBONATE 650 MG PO TABS
650.0000 mg | ORAL_TABLET | Freq: Two times a day (BID) | ORAL | Status: DC
  Administered 2023-01-08 – 2023-01-10 (×6): 650 mg via ORAL
  Filled 2023-01-08 (×6): qty 1

## 2023-01-08 MED ORDER — SERTRALINE HCL 50 MG PO TABS
25.0000 mg | ORAL_TABLET | Freq: Every day | ORAL | Status: DC
Start: 2023-01-09 — End: 2023-01-14
  Administered 2023-01-09 – 2023-01-14 (×6): 25 mg via ORAL
  Filled 2023-01-08 (×6): qty 1

## 2023-01-08 MED ORDER — INSULIN ASPART 100 UNIT/ML IJ SOLN
0.0000 [IU] | Freq: Three times a day (TID) | INTRAMUSCULAR | Status: DC
  Administered 2023-01-08 – 2023-01-09 (×2): 3 [IU] via SUBCUTANEOUS
  Administered 2023-01-09 – 2023-01-10 (×2): 1 [IU] via SUBCUTANEOUS
  Administered 2023-01-10 – 2023-01-11 (×3): 2 [IU] via SUBCUTANEOUS
  Administered 2023-01-11 – 2023-01-12 (×3): 3 [IU] via SUBCUTANEOUS
  Administered 2023-01-12: 2 [IU] via SUBCUTANEOUS
  Administered 2023-01-13 (×2): 5 [IU] via SUBCUTANEOUS
  Administered 2023-01-14 (×3): 2 [IU] via SUBCUTANEOUS
  Filled 2023-01-08 (×16): qty 1

## 2023-01-08 MED ORDER — NYSTATIN 100000 UNIT/GM EX CREA
1.0000 | TOPICAL_CREAM | Freq: Two times a day (BID) | CUTANEOUS | Status: DC
  Administered 2023-01-09 – 2023-01-14 (×10): 1 via TOPICAL
  Filled 2023-01-08 (×3): qty 30

## 2023-01-08 MED ORDER — ACETAMINOPHEN 650 MG RE SUPP: 650.0000 mg | Freq: Four times a day (QID) | RECTAL | Status: DC | PRN

## 2023-01-08 MED ORDER — ATORVASTATIN CALCIUM 20 MG PO TABS
40.0000 mg | ORAL_TABLET | Freq: Every day | ORAL | Status: DC
  Administered 2023-01-08 – 2023-01-14 (×7): 40 mg via ORAL
  Filled 2023-01-08 (×7): qty 2

## 2023-01-08 MED ORDER — ONDANSETRON HCL 4 MG PO TABS: 4.0000 mg | ORAL_TABLET | Freq: Four times a day (QID) | ORAL | Status: DC | PRN

## 2023-01-08 MED ORDER — CHLORHEXIDINE GLUCONATE 4 % EX SOLN: Freq: Every day | CUTANEOUS | Status: DC | PRN

## 2023-01-08 NOTE — H&P (Signed)
History and Physical    Cody Howard YQM:[id number] DOB: 07-25-1934 DOA: 01/08/2023  PCP: Duanne Limerick, MD (Confirm with patient/family/NH records and if not entered, this has to be entered at [facility] point of entry) Patient coming from: Home  I have personally briefly reviewed patient's old medical records in [facility]  Chief Complaint: Feeling weak, frequent falls  HPI: Cody Howard is a 88 y.o. male with medical history significant of CKD stage IV with solitary kidney, PAF on Eliquis, HTN, HLD, IIDM, brought in by family member for evaluation of worsening of generalized weakness frequent falls.  Symptoms started 5 to 6 days ago.  Last week patient developed a rash in his scrotum with some pain and itchiness and went to see PCP was diagnosed with cellulitis and started on Bactrim twice daily and a topical nystatin cream.  From earlier this week patient developed unsteady gait generalized weakness frequent falls he denied any numbness weakness of any 1 side of his body and saying that he just felt weak on his feet and legs.  No dysuria no diarrhea.  Last 2 days oral intake has all been affected been eating drinking less than usual as per wife.  No fever or chills. He been taking 7 days course of Bactrim so far. ED Course: Afebrile, none hypotensive none tachycardia.  Blood work showed AKI on CKD creatinine 3.3 compared to baseline 2.8, bicarb 15, K5.0.  Review of Systems: As per HPI otherwise 14 point review of systems negative.    Past Medical History:  Diagnosis Date   AF (atrial fibrillation) (HCC)    Arthritis    hands   Chronic kidney disease    Diabetes mellitus without complication (HCC)    Hyperlipidemia    Hypertension    Prostate enlargement     Past Surgical History:  Procedure Laterality Date   COLONOSCOPY N/A 02/26/2015   Procedure: COLONOSCOPY;  Surgeon: Wallace Cullens, MD;  Location: [facility];  Service: Gastroenterology;  Laterality: N/A;    COLONOSCOPY WITH PROPOFOL N/A 11/02/2017   Procedure: COLONOSCOPY WITH PROPOFOL;  Surgeon: Toledo, Boykin Nearing, MD;  Location: [facility];  Service: Gastroenterology;  Laterality: N/A;   ESOPHAGOGASTRODUODENOSCOPY N/A 02/26/2015   Procedure: ESOPHAGOGASTRODUODENOSCOPY (EGD);  Surgeon: Wallace Cullens, MD;  Location: [facility];  Service: Gastroenterology;  Laterality: N/A;  Diabetic - oral meds   ESOPHAGOGASTRODUODENOSCOPY (EGD) WITH PROPOFOL N/A 11/02/2017   Procedure: ESOPHAGOGASTRODUODENOSCOPY (EGD) WITH PROPOFOL;  Surgeon: Toledo, Boykin Nearing, MD;  Location: [facility];  Service: Gastroenterology;  Laterality: N/A;   HIP ARTHROPLASTY Left 06/28/2019   Procedure: ARTHROPLASTY BIPOLAR HIP (HEMIARTHROPLASTY);  Surgeon: Christena Flake, MD;  Location: [facility];  Service: Orthopedics;  Laterality: Left;   KIDNEY SURGERY Right    surgery when 88 years old   POLYPECTOMY  02/26/2015   Procedure: POLYPECTOMY;  Surgeon: Wallace Cullens, MD;  Location: [facility];  Service: Gastroenterology;;   sebaceous cyst removal Right    located right of spine on upper back     reports that he quit smoking about 44 years ago. His smoking use included cigarettes. He has never used smokeless tobacco. He reports that he does not currently use alcohol. He reports that he does not use drugs.  No Known Allergies  Family History  Problem Relation Age of Onset   Diabetes Father    Heart disease Father     Prior to Admission medications   Medication Sig Start Date End Date  Taking? Authorizing Provider  apixaban (ELIQUIS) 2.5 MG TABS tablet Take 2.5 mg by mouth 2 (two) times daily. 10/22/21   [provider]  Calcium Citrate-Vitamin D (CITRACAL + D PO) Take 1 tablet by mouth daily.    [provider]  chlorhexidine (HIBICLENS) 4 % external liquid Apply topically daily as needed. 12/31/22   Duanne Limerick, MD  Cholecalciferol 125 MCG (5000 UT) TABS Take 1 tablet (5,000 Units total) by mouth  daily. 12/01/21   Duanne Limerick, MD  ferrous sulfate (FEROSUL) 325 (65 FE) MG tablet Take 1 tablet (325 mg total) by mouth daily with breakfast. 11/25/22   Duanne Limerick, MD  glipiZIDE (GLUCOTROL) 5 MG tablet TAKE (1) TABLET BY MOUTH TWICE DAILY BEFORE MEALS. 11/19/22   Duanne Limerick, MD  glucose blood (ONETOUCH ULTRA) test strip USE TO TEST ONCE A DAY 11/24/21   Duanne Limerick, MD  Lancets [facility] DELICA PLUS LANCET33G) MISC USE TO TEST ONCE DAILY 12/14/19   Duanne Limerick, MD  losartan (COZAAR) 50 MG tablet Take 1 tablet (50 mg total) by mouth daily. 11/25/22   Duanne Limerick, MD  montelukast (SINGULAIR) 10 MG tablet Take 1 tablet (10 mg total) by mouth at bedtime. 11/25/22   Duanne Limerick, MD  mupirocin ointment (BACTROBAN) 2 % Apply 1 Application topically 2 (two) times daily. 12/31/22   Duanne Limerick, MD  nystatin cream (MYCOSTATIN) Apply 1 Application topically 2 (two) times daily. 12/07/22   Duanne Limerick, MD  Probiotic Product (ALIGN) 4 MG CAPS Take 1 capsule (4 mg total) by mouth at bedtime. 12/01/21   Duanne Limerick, MD  sertraline (ZOLOFT) 25 MG tablet Take 1 tablet (25 mg total) by mouth daily. 11/25/22   Duanne Limerick, MD  simvastatin (ZOCOR) 80 MG tablet Take 1 tablet (80 mg total) by mouth every evening. 11/25/22   Duanne Limerick, MD  sulfamethoxazole-trimethoprim (BACTRIM DS) 800-160 MG tablet Take 1 tablet by mouth 2 (two) times daily for 10 days. 12/31/22 01/10/23  Duanne Limerick, MD    Physical Exam: Vitals:   01/08/23 1114  BP: 125/78  Pulse: 76  Resp: 16  Temp: 98.3 F (36.8 C)  TempSrc: Oral  SpO2: 100%    Constitutional: NAD, calm, comfortable Vitals:   01/08/23 1114  BP: 125/78  Pulse: 76  Resp: 16  Temp: 98.3 F (36.8 C)  TempSrc: Oral  SpO2: 100%   Eyes: PERRL, lids and conjunctivae normal ENMT: Mucous membranes are moist. Posterior pharynx clear of any exudate or lesions.Normal dentition.  Neck: normal, supple, no masses, no  thyromegaly Respiratory: clear to auscultation bilaterally, no wheezing, no crackles. Normal respiratory effort. No accessory muscle use.  Cardiovascular: Regular rate and rhythm, no murmurs / rubs / gallops. No extremity edema. 2+ pedal pulses. No carotid bruits.  Abdomen: no tenderness, no masses palpated. No hepatosplenomegaly. Bowel sounds positive.  Musculoskeletal: no clubbing / cyanosis. No joint deformity upper and lower extremities. Good ROM, no contractures. Normal muscle tone.  Skin: Rash on B/L scrotum, non tender. Neurologic: CN 2-12 grossly intact. Sensation intact, DTR normal. Strength 5/5 in all 4.  Psychiatric: Normal judgment and insight. Alert and oriented x 3. Normal mood.    Labs on Admission: I have personally reviewed following labs and imaging studies  CBC: Recent Labs  Lab 01/08/23 1117  WBC 10.8*  HGB 11.8*  HCT 34.7*  MCV 102.4*  PLT 158   Basic Metabolic Panel: Recent Labs  Lab 01/08/23 1117  NA 134*  K 5.0  CL 110  CO2 15*  GLUCOSE 146*  BUN 42*  CREATININE 3.31*  CALCIUM 8.4*   GFR: Estimated Creatinine Clearance: 17.5 mL/min (A) (by C-G formula based on SCr of 3.31 mg/dL (H)). Liver Function Tests: No results for input(s): "AST", "ALT", "ALKPHOS", "BILITOT", "PROT", "ALBUMIN" in the last 168 hours. No results for input(s): "LIPASE", "AMYLASE" in the last 168 hours. No results for input(s): "AMMONIA" in the last 168 hours. Coagulation Profile: No results for input(s): "INR", "PROTIME" in the last 168 hours. Cardiac Enzymes: No results for input(s): "CKTOTAL", "CKMB", "CKMBINDEX", "TROPONINI" in the last 168 hours. BNP (last 3 results) No results for input(s): "PROBNP" in the last 8760 hours. HbA1C: No results for input(s): "HGBA1C" in the last 72 hours. CBG: No results for input(s): "GLUCAP" in the last 168 hours. Lipid Profile: No results for input(s): "CHOL", "HDL", "LDLCALC", "TRIG", "CHOLHDL", "LDLDIRECT" in the last 72  hours. Thyroid Function Tests: No results for input(s): "TSH", "T4TOTAL", "FREET4", "T3FREE", "THYROIDAB" in the last 72 hours. Anemia Panel: No results for input(s): "VITAMINB12", "FOLATE", "FERRITIN", "TIBC", "IRON", "RETICCTPCT" in the last 72 hours. Urine analysis:    Component Value Date/Time   COLORURINE YELLOW 07/07/2022 1318   APPEARANCEUR CLEAR 07/07/2022 1318   APPEARANCEUR Cloudy (A) 08/13/2020 1330   LABSPEC >1.030 (H) 07/07/2022 1318   PHURINE 5.5 07/07/2022 1318   GLUCOSEU 100 (A) 07/07/2022 1318   HGBUR SMALL (A) 07/07/2022 1318   BILIRUBINUR NEGATIVE 07/07/2022 1318   BILIRUBINUR Negative 08/13/2020 1330   KETONESUR NEGATIVE 07/07/2022 1318   PROTEINUR >300 (A) 07/07/2022 1318   NITRITE NEGATIVE 07/07/2022 1318   LEUKOCYTESUR NEGATIVE 07/07/2022 1318    Radiological Exams on Admission: CT HEAD WO CONTRAST  Result Date: 01/08/2023 CLINICAL DATA:  Fall on blood thinners EXAM: CT HEAD WITHOUT CONTRAST TECHNIQUE: Contiguous axial images were obtained from the base of the skull through the vertex without intravenous contrast. RADIATION DOSE REDUCTION: This exam was performed according to the departmental dose-optimization program which includes automated exposure control, adjustment of the mA and/or kV according to patient size and/or use of iterative reconstruction technique. COMPARISON:  Head CT 01/01/2022 FINDINGS: Brain: There is no acute intracranial hemorrhage, extra-axial fluid collection, or acute infarct. Parenchymal volume loss with prominence of the ventricular system and extra-axial CSF spaces is unchanged. Gray-white differentiation is preserved. Hypodensity in the supratentorial white matter is consistent with underlying chronic small-vessel ischemic change. The pituitary and suprasellar region are normal. There is no mass lesion. There is no mass effect or midline shift. Vascular: No hyperdense vessel or unexpected calcification. Skull: Normal. Negative for  fracture or focal lesion. Sinuses/Orbits: The imaged paranasal sinuses are clear. The imaged globes and orbits are unremarkable. Other: The mastoid air cells and middle ear cavities are clear. IMPRESSION: No acute intracranial pathology. Electronically Signed   By: Lesia Hausen M.D.   On: 01/08/2023 12:04   CT Cervical Spine Wo Contrast  Result Date: 01/08/2023 CLINICAL DATA:  Poly trauma, blunt. Weakness after falling 4 days ago. History of dementia. On blood thinners. EXAM: CT CERVICAL SPINE WITHOUT CONTRAST TECHNIQUE: Multidetector CT imaging of the cervical spine was performed without intravenous contrast. Multiplanar CT image reconstructions were also generated. RADIATION DOSE REDUCTION: This exam was performed according to the departmental dose-optimization program which includes automated exposure control, adjustment of the mA and/or kV according to patient size and/or use of iterative reconstruction technique. COMPARISON:  None Available. FINDINGS: Alignment: Straightening with  a mild degenerative anterolisthesis at the C4-5 and C5-6 levels. Skull base and vertebrae: No evidence of acute cervical spine fracture or traumatic subluxation. Multilevel endplate osteophytes with asymmetric left-sided facet hypertrophy at C2-3. Soft tissues and spinal canal: No prevertebral fluid or swelling. No visible canal hematoma. Disc levels: Mild multilevel spondylosis, greatest at C5-6 where there is a broad-based central disc protrusion with covering spur and asymmetric left-sided uncinate spurring. Moderate left and mild right foraminal narrowing. Small central disc protrusion at C4-5. No large disc herniation identified. Upper chest: Mild biapical scarring with calcifications. Other: Bilateral carotid atherosclerosis. IMPRESSION: 1. No evidence of acute cervical spine fracture, traumatic subluxation or static signs of instability. 2. Mild multilevel spondylosis, greatest at C5-6 where there is moderate left and mild  right foraminal narrowing. Electronically Signed   By: Carey Bullocks M.D.   On: 01/08/2023 12:03   DG Foot Complete Left  Result Date: 01/08/2023 CLINICAL DATA:  Pain after fall EXAM: LEFT FOOT - COMPLETE 3 VIEW COMPARISON:  None Available. FINDINGS: No fracture or dislocation. Preserved joint spaces. Osteopenia. Scattered vascular calcifications. Well corticated plantar and Achilles calcaneal spurs. Slight degenerative changes of the first metatarsophalangeal joint. IMPRESSION: Mild degenerative changes. Calcaneal spurs. Vascular calcifications. Electronically Signed   By: Karen Kays M.D.   On: 01/08/2023 12:02    EKG: Independently reviewed.  Sinus rhythm, no acute ST changes.  Assessment/Plan Principal Problem:   AKI (acute kidney injury) (HCC)  (please populate well all problems here in Problem List. (For example, if patient is on BP meds at home and you resume or decide to hold them, it is a problem that needs to be her. Same for CAD, COPD, HLD and so on)  AKI on CKD stage IV With acute non-anion gap metabolic acidosis -Euvolemic -Suspect etiology is related to Bactrim induced nephropathy -Discontinue Bactrim -IV fluid x 24 hours then reevaluate kidney function -Discontinue ARB  Scrotum cellulitis -Change Bactrim to doxycycline x 3 days -Continue topical nystatin  Acute ambulation dysfunction Deconditioning -Probably related to AKI and Bactrim side effect -PT evaluation  IIDM -Hold off Amaryl -Start SSI  HTN -Blood pressure borderline low -Hold off ARB, start as needed hydralazine  PAF -Sinus rhythm, continue renal dosed Eliquis  DVT prophylaxis: Eliquis Code Status: Full code Family Communication: Wife and son at bedside Disposition Plan: Expect less than 2 midnight hospital stay Consults called: None Admission status: MedSurg observation   Emeline General MD [facility] Pager [phone]  01/08/2023, 1:37 PM

## 2023-01-08 NOTE — ED Triage Notes (Signed)
See first nurse note.  Pt states has been shooting pain in his left leg for the last week. Denies swelling. Wife states he's had three falls this week, poor balance, and episodes of incontinence.

## 2023-01-08 NOTE — ED Notes (Signed)
First Nurse Note: Pt to ED via [facility] from home for weakness. Pt had a fall on 10/29. Pt was not seen for fall on the 29th. Pt has hx/o dementia but was A & O x 4 with EMS. Pt is on blood thinner. EMS reports possible broken toe on left foot.

## 2023-01-08 NOTE — Progress Notes (Signed)
[facility] CONSULT NOTE    Date: 01/08/2023                  Patient Name:  Cody Howard  MRN: [id number]  DOB: 1934/05/29  Age / Sex: 88 y.o., male         PCP: Duanne Limerick, MD                 Service Requesting Consult: Medicine                  Reason for Consult: Acute kidney injury            History of Present Illness: Patient is a 88 y.o. male with a PMHx of solitary kidney, chronic kidney disease stage IIIb, diabetes, hypertension, hyperlipidemia, now admitted with history of frequent falls and generalized weakness.  He is found to have acute kidney injury on chronic kidney disease.  Patient is being treated with sulfamethoxazole and trimethoprim as outpatient.  Medications: Outpatient medications: (Not in a hospital admission)   Discontinued Meds:   Medications Discontinued During This Encounter  Medication Reason   glucose blood (ONETOUCH ULTRA) test strip    Lancets (ONETOUCH DELICA PLUS LANCET33G) MISC     Current medications: Current Facility-Administered Medications  Medication Dose Route Frequency Provider Last Rate Last Admin   0.9 %  sodium chloride infusion   Intravenous Continuous Mikey College T, MD 100 mL/hr at 01/08/23 1448 New Bag at 01/08/23 1448   acetaminophen (TYLENOL) tablet 650 mg  650 mg Oral Q6H PRN Mikey College T, MD       Or   acetaminophen (TYLENOL) suppository 650 mg  650 mg Rectal Q6H PRN Mikey College T, MD       acidophilus (RISAQUAD) capsule 1 capsule  1 capsule Oral QHS Mikey College T, MD       apixaban Everlene Balls) tablet 2.5 mg  2.5 mg Oral BID Mikey College T, MD       atorvastatin (LIPITOR) tablet 40 mg  40 mg Oral Daily Mikey College T, MD   40 mg at 01/08/23 1439   chlorhexidine (HIBICLENS) 4 % liquid   Topical Daily PRN Mikey College T, MD       doxycycline (VIBRA-TABS) tablet 100 mg  100 mg Oral Q12H Mikey College T, MD   100 mg at 01/08/23 1442   [START ON 01/09/2023] ferrous sulfate tablet 325 mg  325 mg Oral  Q breakfast Mikey College T, MD       hydrALAZINE (APRESOLINE) tablet 10 mg  10 mg Oral Q6H PRN Mikey College T, MD       insulin aspart (novoLOG) injection 0-9 Units  0-9 Units Subcutaneous TID WC Mikey College T, MD       montelukast (SINGULAIR) tablet 10 mg  10 mg Oral QHS Mikey College T, MD       mupirocin ointment (BACTROBAN) 2 % 1 Application  1 Application Topical BID Mikey College T, MD       nystatin cream (MYCOSTATIN) 1 Application  1 Application Topical BID Mikey College T, MD       ondansetron [facility]) tablet 4 mg  4 mg Oral Q6H PRN Mikey College T, MD       Or   ondansetron [facility]) injection 4 mg  4 mg Intravenous Q6H PRN Emeline General, MD       [START ON 01/09/2023] sertraline (ZOLOFT) tablet 25 mg  25 mg Oral Daily Mikey College  T, MD       sodium bicarbonate tablet 650 mg  650 mg Oral BID Mikey College T, MD   650 mg at 01/08/23 1439   Current Outpatient Medications  Medication Sig Dispense Refill   apixaban (ELIQUIS) 2.5 MG TABS tablet Take 2.5 mg by mouth 2 (two) times daily.     Calcium Citrate-Vitamin D (CITRACAL + D PO) Take 1 tablet by mouth daily.     chlorhexidine (HIBICLENS) 4 % external liquid Apply topically daily as needed.     Cholecalciferol 125 MCG (5000 UT) TABS Take 1 tablet (5,000 Units total) by mouth daily. 90 tablet 0   ferrous sulfate (FEROSUL) 325 (65 FE) MG tablet Take 1 tablet (325 mg total) by mouth daily with breakfast. 90 tablet 1   glipiZIDE (GLUCOTROL) 5 MG tablet TAKE (1) TABLET BY MOUTH TWICE DAILY BEFORE MEALS. 180 tablet 0   losartan (COZAAR) 50 MG tablet Take 1 tablet (50 mg total) by mouth daily. 90 tablet 1   memantine (NAMENDA) 5 MG tablet Take 5 mg by mouth 2 (two) times daily.     montelukast (SINGULAIR) 10 MG tablet Take 1 tablet (10 mg total) by mouth at bedtime. 90 tablet 1   mupirocin ointment (BACTROBAN) 2 % Apply 1 Application topically 2 (two) times daily. 22 g 0   nystatin cream (MYCOSTATIN) Apply 1 Application topically 2 (two) times daily. 30 g  0   Probiotic Product (ALIGN) 4 MG CAPS Take 1 capsule (4 mg total) by mouth at bedtime. 90 capsule 0   sertraline (ZOLOFT) 25 MG tablet Take 1 tablet (25 mg total) by mouth daily. 90 tablet 1   simvastatin (ZOCOR) 80 MG tablet Take 1 tablet (80 mg total) by mouth every evening. 90 tablet 1   sulfamethoxazole-trimethoprim (BACTRIM DS) 800-160 MG tablet Take 1 tablet by mouth 2 (two) times daily for 10 days. 20 tablet 0      Allergies: No Known Allergies    Past Medical History: Past Medical History:  Diagnosis Date   AF (atrial fibrillation) (HCC)    Arthritis    hands   Chronic kidney disease    Diabetes mellitus without complication (HCC)    Hyperlipidemia    Hypertension    Prostate enlargement      Past Surgical History: Past Surgical History:  Procedure Laterality Date   COLONOSCOPY N/A 02/26/2015   Procedure: COLONOSCOPY;  Surgeon: Wallace Cullens, MD;  Location: [facility];  Service: Gastroenterology;  Laterality: N/A;   COLONOSCOPY WITH PROPOFOL N/A 11/02/2017   Procedure: COLONOSCOPY WITH PROPOFOL;  Surgeon: Toledo, Boykin Nearing, MD;  Location: [facility] END[facility];  Service: Gastroenterology;  Laterality: N/A;   ESOPHAGOGASTRODUODENOSCOPY N/A 02/26/2015   Procedure: ESOPHAGOGASTRODUODENOSCOPY (EGD);  Surgeon: Wallace Cullens, MD;  Location: [facility];  Service: Gastroenterology;  Laterality: N/A;  Diabetic - oral meds   ESOPHAGOGASTRODUODENOSCOPY (EGD) WITH PROPOFOL N/A 11/02/2017   Procedure: ESOPHAGOGASTRODUODENOSCOPY (EGD) WITH PROPOFOL;  Surgeon: Toledo, Boykin Nearing, MD;  Location: [facility] ENDOSCOPY;  Service: Gastroenterology;  Laterality: N/A;   HIP ARTHROPLASTY Left 06/28/2019   Procedure: ARTHROPLASTY BIPOLAR HIP (HEMIARTHROPLASTY);  Surgeon: Christena Flake, MD;  Location: [facility] ORS;  Service: Orthopedics;  Laterality: Left;   KIDNEY SURGERY Right    surgery when 88 years old   POLYPECTOMY  02/26/2015   Procedure: POLYPECTOMY;  Surgeon: Wallace Cullens, MD;  Location: [facility];  Service: Gastroenterology;;   sebaceous cyst removal Right    located right of  spine on upper back     Family History: Family History  Problem Relation Age of Onset   Diabetes Father    Heart disease Father      Social History: Social History   Socioeconomic History   Marital status: Married    Spouse name: Claus Silvestro   Number of children: 2   Years of education: 14   Highest education level: 12th grade  Occupational History   Occupation: retired  Tobacco Use   Smoking status: Former    Current packs/day: 0.00    Types: Cigarettes    Quit date: 03/08/1978    Years since quitting: 44.8   Smokeless tobacco: Never  Vaping Use   Vaping status: Never Used  Substance and Sexual Activity   Alcohol use: Not Currently   Drug use: Never   Sexual activity: Not Currently    Partners: Female  Other Topics Concern   Not on file  Social History Narrative   Not on file   Social Determinants of Health   Financial Resource Strain: Low Risk  (07/07/2022)   Overall Financial Resource Strain (CARDIA)    Difficulty of Paying Living Expenses: Not hard at all  Food Insecurity: No Food Insecurity (07/07/2022)   Hunger Vital Sign    Worried About Running Out of Food in the Last Year: Never true    Ran Out of Food in the Last Year: Never true  Transportation Needs: No Transportation Needs (10/18/2022)   PRAPARE - Administrator, Civil Service (Medical): No    Lack of Transportation (Non-Medical): No  Physical Activity: Insufficiently Active (07/07/2022)   Exercise Vital Sign    Days of Exercise per Week: 2 days    Minutes of Exercise per Session: 10 min  Stress: No Stress Concern Present (07/07/2022)   [employer] of Occupational Health - Occupational Stress Questionnaire    Feeling of Stress : Only a little  Social Connections: Moderately Isolated (07/07/2022)   Social Connection and Isolation Panel [NHANES]    Frequency of Communication with Friends  and Family: Twice a week    Frequency of Social Gatherings with Friends and Family: Twice a week    Attends Religious Services: Never    Database administrator or Organizations: No    Attends Banker Meetings: Never    Marital Status: Married  Catering manager Violence: Not At Risk (02/03/2022)   Humiliation, Afraid, Rape, and Kick questionnaire    Fear of Current or Ex-Partner: No    Emotionally Abused: No    Physically Abused: No    Sexually Abused: No     Review of Systems: As per HPI  Vital Signs: Blood pressure 125/78, pulse 76, temperature 98.3 F (36.8 C), temperature source Oral, resp. rate 16, SpO2 100%.  Weight trends: There were no vitals filed for this visit.  Physical Exam: Physical Exam: General:  No acute distress  Head:  Normocephalic, atraumatic. Moist oral mucosal membranes  Eyes:  Anicteric  Neck:  Supple  Lungs:   Clear to auscultation, normal effort  Heart:  S1S2 no rubs  Abdomen:   Soft, nontender, bowel sounds present  Extremities:  peripheral edema.  Neurologic:  Awake, alert, following commands  Skin:  No lesions  Access:     Lab results:  Basic Metabolic Panel: Recent Labs  Lab 01/08/23 1117  NA 134*  K 5.0  CL 110  CO2 15*  GLUCOSE 146*  BUN 42*  CREATININE 3.31*  CALCIUM 8.4*  Creatinine, Ser  Date/Time Value Ref Range Status  01/08/2023 11:17 AM 3.31 (H) 0.61 - 1.24 mg/dL Final  72/53/6644 03:47 PM 2.83 (H) 0.76 - 1.27 mg/dL Final  42/59/5638 75:64 PM 2.58 (H) 0.76 - 1.27 mg/dL Final  33/29/5188 41:66 PM 2.30 (H) 0.76 - 1.27 mg/dL Final  09/05/1599 09:32 AM 2.30 (H) 0.76 - 1.27 mg/dL Final  35/57/3220 25:42 PM 2.28 (H) 0.76 - 1.27 mg/dL Final  70/62/3762 83:15 AM 1.55 (H) 0.61 - 1.24 mg/dL Final  17/61/6073 71:06 AM 2.06 (H) 0.61 - 1.24 mg/dL Final  26/94/8546 27:03 AM 2.46 (H) 0.61 - 1.24 mg/dL Final  50/11/3816 29:93 AM 2.18 (H) 0.61 - 1.24 mg/dL Final  71/69/6789 38:10 AM 2.04 (H) 0.61 - 1.24 mg/dL  Final  17/51/0258 52:77 AM 2.13 (H) 0.61 - 1.24 mg/dL Final  82/42/3536 14:43 AM 2.00 (H) 0.76 - 1.27 mg/dL Final  15/40/0867 61:95 PM 2.01 (H) 0.61 - 1.24 mg/dL Final  09/32/6712 45:80 PM 1.89 (H) 0.76 - 1.27 mg/dL Final  99/83/3825 05:39 PM 2.33 (H) 0.76 - 1.27 mg/dL Final  76/73/4193 79:02 PM 1.83 (H) 0.76 - 1.27 mg/dL Final  40/97/3532 99:24 AM 1.60 (H) 0.61 - 1.24 mg/dL Final  26/83/4196 22:29 PM 1.70 (H) 0.76 - 1.27 mg/dL Final  79/89/2119 41:74 AM 1.56 (H) 0.76 - 1.27 mg/dL Final  10/19/4816 56:31 AM 1.49 (H) 0.76 - 1.27 mg/dL Final  49/70/2637 85:88 AM 1.43 (H) 0.76 - 1.27 mg/dL Final    CBC: Recent Labs  Lab 01/08/23 1117  WBC 10.8*  HGB 11.8*  HCT 34.7*  MCV 102.4*  PLT 158    Microbiology: Results for orders placed or performed in visit on 08/13/20  Microscopic Examination     Status: Abnormal   Collection Time: 08/13/20  1:30 PM   Urine  Result Value Ref Range Status   WBC, UA 0-5 0 - 5 /hpf Final   RBC, Urine 0-2 0 - 2 /hpf Final   Epithelial Cells (non renal) None seen 0 - 10 /hpf Final   Casts Present (A) None seen /lpf Final   Cast Type Hyaline casts N/A Final   Mucus, UA Present (A) Not Estab. Final   Bacteria, UA None seen None seen/Few Final    Urinalysis: No results for input(s): "COLORURINE", "LABSPEC", "PHURINE", "GLUCOSEU", "HGBUR", "BILIRUBINUR", "KETONESUR", "PROTEINUR", "UROBILINOGEN", "NITRITE", "LEUKOCYTESUR" in the last 72 hours.  Invalid input(s): "APPERANCEUR"   Imaging:  CT HEAD WO CONTRAST  Result Date: 01/08/2023 CLINICAL DATA:  Fall on blood thinners EXAM: CT HEAD WITHOUT CONTRAST TECHNIQUE: Contiguous axial images were obtained from the base of the skull through the vertex without intravenous contrast. RADIATION DOSE REDUCTION: This exam was performed according to the departmental dose-optimization program which includes automated exposure control, adjustment of the mA and/or kV according to patient size and/or use of iterative  reconstruction technique. COMPARISON:  Head CT 01/01/2022 FINDINGS: Brain: There is no acute intracranial hemorrhage, extra-axial fluid collection, or acute infarct. Parenchymal volume loss with prominence of the ventricular system and extra-axial CSF spaces is unchanged. Gray-white differentiation is preserved. Hypodensity in the supratentorial white matter is consistent with underlying chronic small-vessel ischemic change. The pituitary and suprasellar region are normal. There is no mass lesion. There is no mass effect or midline shift. Vascular: No hyperdense vessel or unexpected calcification. Skull: Normal. Negative for fracture or focal lesion. Sinuses/Orbits: The imaged paranasal sinuses are clear. The imaged globes and orbits are unremarkable. Other: The mastoid air cells and middle ear cavities are  clear. IMPRESSION: No acute intracranial pathology. Electronically Signed   By: Lesia Hausen M.D.   On: 01/08/2023 12:04   CT Cervical Spine Wo Contrast  Result Date: 01/08/2023 CLINICAL DATA:  Poly trauma, blunt. Weakness after falling 4 days ago. History of dementia. On blood thinners. EXAM: CT CERVICAL SPINE WITHOUT CONTRAST TECHNIQUE: Multidetector CT imaging of the cervical spine was performed without intravenous contrast. Multiplanar CT image reconstructions were also generated. RADIATION DOSE REDUCTION: This exam was performed according to the departmental dose-optimization program which includes automated exposure control, adjustment of the mA and/or kV according to patient size and/or use of iterative reconstruction technique. COMPARISON:  None Available. FINDINGS: Alignment: Straightening with a mild degenerative anterolisthesis at the C4-5 and C5-6 levels. Skull base and vertebrae: No evidence of acute cervical spine fracture or traumatic subluxation. Multilevel endplate osteophytes with asymmetric left-sided facet hypertrophy at C2-3. Soft tissues and spinal canal: No prevertebral fluid or  swelling. No visible canal hematoma. Disc levels: Mild multilevel spondylosis, greatest at C5-6 where there is a broad-based central disc protrusion with covering spur and asymmetric left-sided uncinate spurring. Moderate left and mild right foraminal narrowing. Small central disc protrusion at C4-5. No large disc herniation identified. Upper chest: Mild biapical scarring with calcifications. Other: Bilateral carotid atherosclerosis. IMPRESSION: 1. No evidence of acute cervical spine fracture, traumatic subluxation or static signs of instability. 2. Mild multilevel spondylosis, greatest at C5-6 where there is moderate left and mild right foraminal narrowing. Electronically Signed   By: Carey Bullocks M.D.   On: 01/08/2023 12:03   DG Foot Complete Left  Result Date: 01/08/2023 CLINICAL DATA:  Pain after fall EXAM: LEFT FOOT - COMPLETE 3 VIEW COMPARISON:  None Available. FINDINGS: No fracture or dislocation. Preserved joint spaces. Osteopenia. Scattered vascular calcifications. Well corticated plantar and Achilles calcaneal spurs. Slight degenerative changes of the first metatarsophalangeal joint. IMPRESSION: Mild degenerative changes. Calcaneal spurs. Vascular calcifications. Electronically Signed   By: Karen Kays M.D.   On: 01/08/2023 12:02     Assessment & Plan:  88 y.o. male with a PMHx of solitary kidney, chronic kidney disease stage IIIb, diabetes, hypertension, hyperlipidemia, now admitted with history of frequent falls and generalized weakness.  He is found to have acute kidney injury on chronic kidney disease.  Patient is being treated with sulfamethoxazole and trimethoprim as outpatient.  Principal Problem:   AKI (acute kidney injury) (HCC)  #1: Acute kidney injury on chronic kidney disease: This is most likely secondary to prerenal azotemia.  Will continue IV fluid resuscitation.  #4 patient has history of solitary kidney.  Patient needs a renal sonogram.  #2: Metabolic acidosis: Acidosis  most likely secondary to sepsis.  Agree with sodium bicarbonate orally.  #3: Anemia: Anemia secondary to chronic kidney disease versus iron deficiency.  #4: Scrotal cellulitis: Agree to discontinue Bactrim and start on doxycycline.  Will follow closely.  LOS: 0 Lorain Childes, MD [facility]. 11/2/20242:56 PM

## 2023-01-08 NOTE — ED Provider Notes (Signed)
[facility] Provider Note    Event Date/Time   First MD Initiated Contact with Patient 01/08/23 1240     (approximate)   History   Weakness   HPI  Cody Howard is a 88 y.o. male with a history of chronic kidney disease, solitary kidney, diabetes, hypertension who presents with increased weakness, more frequent falls, tremors.  No reports of fevers or chills.      Physical Exam   Triage Vital Signs: ED Triage Vitals  Encounter Vitals Group     BP 01/08/23 1114 125/78     Systolic BP Percentile --      Diastolic BP Percentile --      Pulse Rate 01/08/23 1114 76     Resp 01/08/23 1114 16     Temp 01/08/23 1114 98.3 F (36.8 C)     Temp Source 01/08/23 1114 Oral     SpO2 01/08/23 1114 100 %     Weight --      Height --      Head Circumference --      Peak Flow --      Pain Score 01/08/23 1112 3     Pain Loc --      Pain Education --      Exclude from Growth Chart --     Most recent vital signs: Vitals:   01/08/23 1114  BP: 125/78  Pulse: 76  Resp: 16  Temp: 98.3 F (36.8 C)  SpO2: 100%     General: Awake, no distress.  CV:  Good peripheral perfusion.  Resp:  Normal effort.  Clear to auscultation bilaterally Abd:  No distention.  Soft, nontender Other:  GU: Mild erythema to the groin most consistent with fungal infection  Left foot: Bruising to the second toe, no evidence of infection   ED Results / Procedures / Treatments   Labs (all labs ordered are listed, but only abnormal results are displayed) Labs Reviewed  BASIC METABOLIC PANEL - Abnormal; Notable for the following components:      Result Value   Sodium 134 (*)    CO2 15 (*)    Glucose, Bld 146 (*)    BUN 42 (*)    Creatinine, Ser 3.31 (*)    Calcium 8.4 (*)    GFR, Estimated 18 (*)    All other components within normal limits  CBC - Abnormal; Notable for the following components:   WBC 10.8 (*)    RBC 3.39 (*)    Hemoglobin 11.8 (*)    HCT 34.7 (*)     MCV 102.4 (*)    MCH 34.8 (*)    All other components within normal limits  URINALYSIS, ROUTINE W REFLEX MICROSCOPIC  CBG MONITORING, ED     EKG  ED ECG REPORT I, Jene Every, the attending physician, personally viewed and interpreted this ECG.  Date: 01/08/2023  Rhythm: normal sinus rhythm QRS Axis: normal Intervals: normal ST/T Wave abnormalities: normal Narrative Interpretation: no evidence of acute ischemia    RADIOLOGY CT head and cervical spine unremarkable    PROCEDURES:  Critical Care performed:   Procedures   MEDICATIONS ORDERED IN ED: Medications  sodium chloride 0.9 % bolus 500 mL (has no administration in time range)     IMPRESSION / MDM / ASSESSMENT AND PLAN / ED COURSE  I reviewed the triage vital signs and the nursing notes. Patient's presentation is most consistent with severe exacerbation of chronic illness.  Patient presents with  weakness, intermittent nausea, frequent falls as detailed above.  Differential includes dehydration, infection, worsening dementia/decline  Lab work is notable for elevated creatinine, his baseline appears to be around 2.5, elevated to 3.3 now.  Given his solitary kidney we will start IV fluids, consult nephrology and admit to the hospitalist team        FINAL CLINICAL IMPRESSION(S) / ED DIAGNOSES   Final diagnoses:  Acute renal failure superimposed on chronic kidney disease, unspecified acute renal failure type, unspecified CKD stage (HCC)  Weakness     Rx / DC Orders   ED Discharge Orders     None        Note:  This document was prepared using Dragon voice recognition software and may include unintentional dictation errors.   Jene Every, MD 01/08/23 [phone]

## 2023-01-09 ENCOUNTER — Observation Stay: Payer: Medicare HMO

## 2023-01-09 DIAGNOSIS — I482 Chronic atrial fibrillation, unspecified: Secondary | ICD-10-CM | POA: Diagnosis not present

## 2023-01-09 DIAGNOSIS — N171 Acute kidney failure with acute cortical necrosis: Secondary | ICD-10-CM | POA: Diagnosis not present

## 2023-01-09 DIAGNOSIS — E872 Acidosis, unspecified: Secondary | ICD-10-CM

## 2023-01-09 DIAGNOSIS — N179 Acute kidney failure, unspecified: Secondary | ICD-10-CM | POA: Diagnosis not present

## 2023-01-09 DIAGNOSIS — N1330 Unspecified hydronephrosis: Secondary | ICD-10-CM | POA: Diagnosis not present

## 2023-01-09 DIAGNOSIS — N184 Chronic kidney disease, stage 4 (severe): Secondary | ICD-10-CM | POA: Diagnosis not present

## 2023-01-09 DIAGNOSIS — D631 Anemia in chronic kidney disease: Secondary | ICD-10-CM | POA: Diagnosis not present

## 2023-01-09 DIAGNOSIS — N2581 Secondary hyperparathyroidism of renal origin: Secondary | ICD-10-CM | POA: Diagnosis not present

## 2023-01-09 DIAGNOSIS — E871 Hypo-osmolality and hyponatremia: Secondary | ICD-10-CM | POA: Insufficient documentation

## 2023-01-09 DIAGNOSIS — Q600 Renal agenesis, unilateral: Secondary | ICD-10-CM | POA: Diagnosis not present

## 2023-01-09 LAB — CBC
HCT: 29.4 % — ABNORMAL LOW (ref 39.0–52.0)
Hemoglobin: 10 g/dL — ABNORMAL LOW (ref 13.0–17.0)
MCH: 32.8 pg (ref 26.0–34.0)
MCHC: 34 g/dL (ref 30.0–36.0)
MCV: 96.4 fL (ref 80.0–100.0)
Platelets: 112 10*3/uL — ABNORMAL LOW (ref 150–400)
RBC: 3.05 MIL/uL — ABNORMAL LOW (ref 4.22–5.81)
RDW: 13.2 % (ref 11.5–15.5)
WBC: 7.2 10*3/uL (ref 4.0–10.5)
nRBC: 0 % (ref 0.0–0.2)

## 2023-01-09 LAB — BASIC METABOLIC PANEL
Anion gap: 3 — ABNORMAL LOW (ref 5–15)
BUN: 41 mg/dL — ABNORMAL HIGH (ref 8–23)
CO2: 19 mmol/L — ABNORMAL LOW (ref 22–32)
Calcium: 7.9 mg/dL — ABNORMAL LOW (ref 8.9–10.3)
Chloride: 114 mmol/L — ABNORMAL HIGH (ref 98–111)
Creatinine, Ser: 3.09 mg/dL — ABNORMAL HIGH (ref 0.61–1.24)
GFR, Estimated: 19 mL/min — ABNORMAL LOW (ref >60)
Glucose, Bld: 136 mg/dL — ABNORMAL HIGH (ref 70–99)
Potassium: 4.8 mmol/L (ref 3.5–5.1)
Sodium: 136 mmol/L (ref 135–145)

## 2023-01-09 LAB — CBG MONITORING, ED
Glucose-Capillary: 120 mg/dL — ABNORMAL HIGH (ref 70–99)
Glucose-Capillary: 202 mg/dL — ABNORMAL HIGH (ref 70–99)
Glucose-Capillary: 126 mg/dL — ABNORMAL HIGH (ref 70–99)

## 2023-01-09 MED ORDER — SODIUM CHLORIDE 0.9 % IV SOLN: INTRAVENOUS | Status: AC

## 2023-01-09 NOTE — Progress Notes (Signed)
[facility]  PROGRESS NOTE   Subjective:   Patient seen at bedside in the emergency room. Ate well today. Feels much better.  Family at bedside.  Objective:  Vital signs: Blood pressure (!) 158/70, pulse 64, temperature 98.7 F (37.1 C), temperature source Oral, resp. rate 20, SpO2 93%.  Intake/Output Summary (Last 24 hours) at 01/09/2023 1353 Last data filed at 01/09/2023 1145 Gross per 24 hour  Intake 500 ml  Output 1850 ml  Net -1350 ml   There were no vitals filed for this visit.   Physical Exam: General:  No acute distress  Head:  Normocephalic, atraumatic. Moist oral mucosal membranes  Eyes:  Anicteric  Neck:  Supple  Lungs:   Clear to auscultation, normal effort  Heart:  S1S2 no rubs  Abdomen:   Soft, nontender, bowel sounds present  Extremities:  peripheral edema.  Neurologic:  Awake, alert, following commands  Skin:  No lesions  Access:     Basic Metabolic Panel: Recent Labs  Lab 01/08/23 1117 01/09/23 0326  NA 134* 136  K 5.0 4.8  CL 110 114*  CO2 15* 19*  GLUCOSE 146* 136*  BUN 42* 41*  CREATININE 3.31* 3.09*  CALCIUM 8.4* 7.9*   GFR: Estimated Creatinine Clearance: 18.7 mL/min (A) (by C-G formula based on SCr of 3.09 mg/dL (H)).  Liver Function Tests: No results for input(s): "AST", "ALT", "ALKPHOS", "BILITOT", "PROT", "ALBUMIN" in the last 168 hours. No results for input(s): "LIPASE", "AMYLASE" in the last 168 hours. No results for input(s): "AMMONIA" in the last 168 hours.  CBC: Recent Labs  Lab 01/08/23 1117 01/09/23 0326  WBC 10.8* 7.2  HGB 11.8* 10.0*  HCT 34.7* 29.4*  MCV 102.4* 96.4  PLT 158 112*     HbA1C: Hemoglobin A1C  Date/Time Value Ref Range Status  09/24/2022 12:00 AM 7.4  Final  09/21/2021 12:00 AM 6.7  Final    Urinalysis: Recent Labs    01/08/23 1326  COLORURINE YELLOW*  LABSPEC 1.014  PHURINE 5.0  GLUCOSEU NEGATIVE  HGBUR SMALL*  BILIRUBINUR NEGATIVE  KETONESUR NEGATIVE  PROTEINUR  >=300*  NITRITE NEGATIVE  LEUKOCYTESUR NEGATIVE      Imaging: CT HEAD WO CONTRAST  Result Date: 01/08/2023 CLINICAL DATA:  Fall on blood thinners EXAM: CT HEAD WITHOUT CONTRAST TECHNIQUE: Contiguous axial images were obtained from the base of the skull through the vertex without intravenous contrast. RADIATION DOSE REDUCTION: This exam was performed according to the departmental dose-optimization program which includes automated exposure control, adjustment of the mA and/or kV according to patient size and/or use of iterative reconstruction technique. COMPARISON:  Head CT 01/01/2022 FINDINGS: Brain: There is no acute intracranial hemorrhage, extra-axial fluid collection, or acute infarct. Parenchymal volume loss with prominence of the ventricular system and extra-axial CSF spaces is unchanged. Gray-white differentiation is preserved. Hypodensity in the supratentorial white matter is consistent with underlying chronic small-vessel ischemic change. The pituitary and suprasellar region are normal. There is no mass lesion. There is no mass effect or midline shift. Vascular: No hyperdense vessel or unexpected calcification. Skull: Normal. Negative for fracture or focal lesion. Sinuses/Orbits: The imaged paranasal sinuses are clear. The imaged globes and orbits are unremarkable. Other: The mastoid air cells and middle ear cavities are clear. IMPRESSION: No acute intracranial pathology. Electronically Signed   By: Lesia Hausen M.D.   On: 01/08/2023 12:04   CT Cervical Spine Wo Contrast  Result Date: 01/08/2023 CLINICAL DATA:  Poly trauma, blunt. Weakness after falling 4 days ago.  History of dementia. On blood thinners. EXAM: CT CERVICAL SPINE WITHOUT CONTRAST TECHNIQUE: Multidetector CT imaging of the cervical spine was performed without intravenous contrast. Multiplanar CT image reconstructions were also generated. RADIATION DOSE REDUCTION: This exam was performed according to the departmental  dose-optimization program which includes automated exposure control, adjustment of the mA and/or kV according to patient size and/or use of iterative reconstruction technique. COMPARISON:  None Available. FINDINGS: Alignment: Straightening with a mild degenerative anterolisthesis at the C4-5 and C5-6 levels. Skull base and vertebrae: No evidence of acute cervical spine fracture or traumatic subluxation. Multilevel endplate osteophytes with asymmetric left-sided facet hypertrophy at C2-3. Soft tissues and spinal canal: No prevertebral fluid or swelling. No visible canal hematoma. Disc levels: Mild multilevel spondylosis, greatest at C5-6 where there is a broad-based central disc protrusion with covering spur and asymmetric left-sided uncinate spurring. Moderate left and mild right foraminal narrowing. Small central disc protrusion at C4-5. No large disc herniation identified. Upper chest: Mild biapical scarring with calcifications. Other: Bilateral carotid atherosclerosis. IMPRESSION: 1. No evidence of acute cervical spine fracture, traumatic subluxation or static signs of instability. 2. Mild multilevel spondylosis, greatest at C5-6 where there is moderate left and mild right foraminal narrowing. Electronically Signed   By: Carey Bullocks M.D.   On: 01/08/2023 12:03   DG Foot Complete Left  Result Date: 01/08/2023 CLINICAL DATA:  Pain after fall EXAM: LEFT FOOT - COMPLETE 3 VIEW COMPARISON:  None Available. FINDINGS: No fracture or dislocation. Preserved joint spaces. Osteopenia. Scattered vascular calcifications. Well corticated plantar and Achilles calcaneal spurs. Slight degenerative changes of the first metatarsophalangeal joint. IMPRESSION: Mild degenerative changes. Calcaneal spurs. Vascular calcifications. Electronically Signed   By: Karen Kays M.D.   On: 01/08/2023 12:02     Medications:     acidophilus  1 capsule Oral QHS   apixaban  2.5 mg Oral BID   atorvastatin  40 mg Oral Daily    doxycycline  100 mg Oral Q12H   ferrous sulfate  325 mg Oral Q breakfast   insulin aspart  0-9 Units Subcutaneous TID WC   montelukast  10 mg Oral QHS   mupirocin ointment  1 Application Topical BID   nystatin cream  1 Application Topical BID   sertraline  25 mg Oral Daily   sodium bicarbonate  650 mg Oral BID    Assessment/ Plan:     88 y.o. male with a PMHx of solitary kidney, chronic kidney disease stage IIIb, diabetes, hypertension, hyperlipidemia, now admitted with history of frequent falls and generalized weakness.  He is found to have acute kidney injury on chronic kidney disease.  Patient is being treated with sulfamethoxazole and trimethoprim as outpatient.   Principal Problem:   AKI (acute kidney injury) (HCC)   #1: Acute kidney injury on chronic kidney disease: This is most likely secondary to prerenal azotemia.  Will continue IV fluid resuscitation.  Patient has history of solitary functioning right kidney.  Patient needs a renal sonogram, orders placed.   #2: Metabolic acidosis: Acidosis most likely secondary to sepsis.  Agree with sodium bicarbonate orally.   #3: Anemia: Anemia secondary to chronic kidney disease versus iron deficiency.  Continue iron supplementation.   #4: Scrotal cellulitis: Agree to discontinue Bactrim and was started on doxycycline.  #5: Leukocytosis: WBC improving.   #6: Diabetes: Continue insulin for blood sugar coverage.  Spoke to patient's wife at bedside. Labs and medications reviewed. Will continue to follow along with you.   LOS: 0 Hanako Tipping  Suezanne Jacquet, MD [facility] 11/3/20241:53 PM

## 2023-01-09 NOTE — Progress Notes (Signed)
Occupational Therapy Evaluation Patient Details Name: Cody Howard MRN: [id number] DOB: [birth year] Today's Date: 01/09/2023   History of Present Illness Cody Howard is a 88 y.o. male with medical history significant of CKD stage IV with solitary kidney, PAF on Eliquis, HTN, HLD, IIDM, brought in by family member for evaluation of worsening of generalized weakness frequent falls.   Clinical Impression   Pt was seen for OT evaluation this date. Pt poor historian; info provided about PLOF is different from what seen in chart. Prior to hospital admission, pt was IND with ADLs and occasionally uses [facility] for mobility, denies recent falls (pt brought to hospital for weakness/falls). Pt lives with spouse in one level home with level entry.   Pt presents to acute OT demonstrating impaired ADL performance and functional mobility 2/2 generalized weakness, decreased tolerance to activity and impaired cognition (See OT problem list for additional functional deficits). Currently functioning at [facility] level for seated EOB UB  ADL tasks and MOD A for LB ADL tasks. Stands with MOD A and RW, able to take small, shuffling lateral steps towards [facility] with MOD A and RW but with posterior LOB due to unsteadiness in BLE. MOD A to return to seated, pt endorsing feeling "weak". Pt declines transfer to recliner this date due to fatigue. Follows 1-step commands with increased time, but pt often answering questions inappropriately, e.g. when asked his height, his response was "2 feet tall".  Pt would benefit from skilled OT services to address noted impairments and functional limitations (see below for any additional details) in order to maximize safety and independence while minimizing falls risk and caregiver burden. Anticipate the need for follow up OT services upon acute hospital DC.        If plan is discharge home, recommend the following: A lot of help with walking and/or transfers;A lot of help with  bathing/dressing/bathroom;Assistance with cooking/housework;Direct supervision/assist for medications management;Direct supervision/assist for financial management;Assist for transportation;Supervision due to cognitive status    Functional Status Assessment  Patient has had a recent decline in their functional status and demonstrates the ability to make significant improvements in function in a reasonable and predictable amount of time.  Equipment Recommendations  None recommended by OT    Recommendations for Other Services Other (comment)     Precautions / Restrictions Precautions Precautions: Fall      Mobility Bed Mobility Overal bed mobility: Needs Assistance Bed Mobility: Sit to Supine, Supine to Sit     Supine to sit: Min assist, HOB elevated Sit to supine: HOB elevated, Min assist   General bed mobility comments: pt reaches for HHA to transition to seated, assist for BLE to return to supine    Transfers Overall transfer level: Needs assistance Equipment used: Rolling walker (2 wheels) Transfers: Sit to/from Stand Sit to Stand: Mod assist           General transfer comment: cues for hand placement and weight shift to stand. MOD A for lateral steps      Balance Overall balance assessment: History of Falls, Needs assistance Sitting-balance support: No upper extremity supported, Single extremity supported Sitting balance-Leahy Scale: Fair Sitting balance - Comments: reaches to don/doff socks with posterior lean Postural control: Posterior lean Standing balance support: Bilateral upper extremity supported, During functional activity, Reliant on assistive device for balance Standing balance-Leahy Scale: Poor Standing balance comment: LOB in standing with unsteadiness in BLE  ADL either performed or assessed with clinical judgement   ADL Overall ADL's : Needs assistance/impaired                     Lower Body  Dressing: Contact guard assist;Sitting/lateral leans Lower Body Dressing Details (indicate cue type and reason): don/doff socks sitting EOB, posterior lean, CGA for safety Toilet Transfer: Moderate assistance Toilet Transfer Details (indicate cue type and reason): pt stands with increased effort using RW and MOD A, posterior LOB d/t unsteadiness in BLE requiring MOD A to return to seated         Functional mobility during ADLs: Minimal assistance;Rolling walker (2 wheels) General ADL Comments: Currently functioning at CGA level for seated EOB ADL UB tasks and MOD A for LB ADL tasks. Stands with MOD A and RW, able to take small, shuffling lateral steps towards [facility] with MOD A and RW but with posterior LOB due to unsteadiness in BLE. MOD A to return to seated, pt endorsing feeling "weak"     Vision Baseline Vision/History: 1 Wears glasses Patient Visual Report: No change from baseline              Pertinent Vitals/Pain Pain Assessment Pain Assessment: No/denies pain     Extremity/Trunk Assessment Upper Extremity Assessment RUE Deficits / Details: ROM WFL, grossly 4/5 MMT LUE Deficits / Details: ROM WFL, grossly 4/5           Communication Communication Communication: Difficulty following commands/understanding   Cognition Arousal: Alert Behavior During Therapy: WFL for tasks assessed/performed Overall Cognitive Status: No family/caregiver present to determine baseline cognitive functioning                                 General Comments: Oriented to self, DOB, location (hospital, states [place]), but not date or situation, overall poor historian as pt denies recent falls or weakness                Home Living Family/patient expects to be discharged to:: Private residence Living Arrangements: Spouse/significant other Available Help at Discharge: Available PRN/intermittently;Family Type of Home: House Home Access: Level entry     Home Layout: One  level     Bathroom Shower/Tub: Chief Strategy Officer: Handicapped height Bathroom Accessibility: Yes How Accessible: Accessible via walker Home Equipment: Rolling Walker (2 wheels);Cane - single point;Grab bars - toilet;Grab bars - tub/shower;Toilet riser          Prior Functioning/Environment Prior Level of Function : Patient poor historian/Family not available             Mobility Comments: pt states no falls (chart review indicates otherwise), uses [facility] occassionally ADLs Comments: IND per pt        OT Problem List: Decreased strength;Decreased activity tolerance;Impaired balance (sitting and/or standing);Decreased cognition;Decreased safety awareness;Decreased knowledge of use of DME or AE      OT Treatment/Interventions: Self-care/ADL training;Therapeutic exercise;Energy conservation;DME and/or AE instruction;Therapeutic activities;Cognitive remediation/compensation;Visual/perceptual remediation/compensation;Patient/family education;Balance training    OT Goals(Current goals can be found in the care plan section) Acute Rehab OT Goals OT Goal Formulation: With patient Time For Goal Achievement: 01/23/23 Potential to Achieve Goals: Good  OT Frequency: Min 1X/week       AM-PAC OT "6 Clicks" Daily Activity     Outcome Measure Help from another person eating meals?: None Help from another person taking care of personal grooming?: A Little Help from another person  toileting, which includes using toliet, bedpan, or urinal?: A Lot Help from another person bathing (including washing, rinsing, drying)?: A Lot Help from another person to put on and taking off regular upper body clothing?: A Little Help from another person to put on and taking off regular lower body clothing?: A Lot 6 Click Score: 16   End of Session Equipment Utilized During Treatment: Rolling walker (2 wheels) Nurse Communication: Mobility status  Activity Tolerance: Patient tolerated  treatment well Patient left: in bed;with call bell/phone within reach;with bed alarm set  OT Visit Diagnosis: Muscle weakness (generalized) (M62.81);History of falling (Z91.81);Repeated falls (R29.6);Unsteadiness on feet (R26.81)                Time: 4098-1191 OT Time Calculation (min): 29 min Charges:  OT General Charges $OT Visit: 1 Visit OT Evaluation $OT Eval Low Complexity: 1 Low  Omunique Pederson L. Ramonia Mcclaran, OTR/L  01/09/23, 12:51 PM

## 2023-01-09 NOTE — Progress Notes (Addendum)
  Progress Note   Patient: Cody Howard ZOX:[id number] DOB: 1934-07-03 DOA: 01/08/2023     0 DOS: the patient was seen and examined on 01/09/2023   Brief hospital course: Cody Howard is a 88 y.o. male with medical history significant of CKD stage IV with solitary kidney, PAF on Eliquis, HTN, HLD, IIDM, brought in by family member for evaluation of worsening of generalized weakness frequent falls.  Patient was found to have acute on chronic renal failure.  Patient recently was placed on Bactrim for treating cellulitis. He is seen by nephrology, started IV fluids for acute renal failure.   Principal Problem:   Acute renal failure superimposed on stage 4 chronic kidney disease (HCC) Active Problems:   Type II diabetes mellitus with renal manifestations (HCC)   Atrial fibrillation, chronic (HCC)   Metabolic acidosis   Hyponatremia   Assessment and Plan: Acute kidney injury on chronic kidney disease stage IV. Metabolic acidosis plan Mild hyponatremia. Patient had a worsening renal function after taking Bactrim. He has been treated with IV fluids, seen by nephrology. Renal function gradually improving.  Continue reduced dose IV fluids.  Recheck renal function tomorrow.  Scrotal cellulitis. Bactrim discontinued, doxycycline started.  Chronic atrial fibrillation. Rate under control, continue Eliquis.  Severe debility.  Frequent falls. Patient has been seen by PT/OT, recommending nursing home placement.  Type 2 diabetes  Hold off oral diabetic medications, continue sliding scale insulin.  Essential hypertension.    hold off ARB.    Subjective:  Patient has significant weakness.  Denies any short of breath or cough.  Physical Exam: Vitals:   01/09/23 0258 01/09/23 0803 01/09/23 0803 01/09/23 0830  BP:  (!) 173/77  (!) 165/83  Pulse:  66  67  Resp:  (!) 23  19  Temp: 98.1 F (36.7 C)  98.4 F (36.9 C)   TempSrc: Oral  Oral   SpO2:  98%  97%   General exam:  Appears calm and comfortable  Respiratory system: Clear to auscultation. Respiratory effort normal. Cardiovascular system: Irregular. No JVD, murmurs, rubs, gallops or clicks. No pedal edema. Gastrointestinal system: Abdomen is nondistended, soft and nontender. No organomegaly or masses felt. Normal bowel sounds heard. Central nervous system: Alert and oriented x2. No focal neurological deficits. Extremities: Symmetric 5 x 5 power. Skin: No rashes, lesions or ulcers Psychiatry: Judgement and insight appear normal. Mood & affect appropriate.    Data Reviewed:  Chest CT scan, C-spine CT scan, foot x-ray and lab results reviewed.  Family Communication: None  Disposition: Status is: Observation      Time spent: 35 minutes  Author: Marrion Coy, MD 01/09/2023 12:56 PM  For on call review [email or web address].

## 2023-01-09 NOTE — Evaluation (Signed)
Physical Therapy Evaluation Patient Details Name: Cody Howard MRN: [id number] DOB: 13-Sep-1934 Today's Date: 01/09/2023  History of Present Illness  HAROON SHATTO is a 88 y.o. male with medical history significant of CKD stage IV with solitary kidney, PAF on Eliquis, HTN, HLD, IIDM, brought in by family member for evaluation of worsening of generalized weakness frequent falls.  Clinical Impression  Pt received in bed with wife by his side. As per Wife pt fell three times at home in last one week. Prior to that pt Ind Ambulator using FWW at household level. Wife prepares meals, help pt with dressing, and bathing. Pt and wife were in communication with [facility] company for [facility] but had to go to ED. Wife is very ill and unable to provide physical assistance to pt any more of the level he needs currently. Pt agreeable to PT Interventions readily. Pt is very pleasant and cooperative. PT assessment revealed pt is weak in BLE, reduced neuromotor control, decreased balance and increased fall risk with sitting and standing. BP in sitting 148/102 and after exertion 188/77. Pt ned mod assist for Bed mobility. STS, and side stepping with max of 1. Pt has mod to severe posterior lean in sitting and standing. PT will continue in acute and Pt will benefit from continued PT beyond acute care. Pt and wife agrees.      If plan is discharge home, recommend the following: A lot of help with walking and/or transfers;A lot of help with bathing/dressing/bathroom;Assistance with cooking/housework;Direct supervision/assist for medications management;Assist for transportation;Help with stairs or ramp for entrance   Can travel by private vehicle   No    Equipment Recommendations Rolling walker (2 wheels)  Recommendations for Other Services       Functional Status Assessment Patient has had a recent decline in their functional status and demonstrates the ability to make significant improvements in function in a reasonable  and predictable amount of time.     Precautions / Restrictions Precautions Precautions: Fall Restrictions Weight Bearing Restrictions: No      Mobility  Bed Mobility Overal bed mobility: Needs Assistance Bed Mobility: Supine to Sit, Sit to Supine     Supine to sit: Mod assist Sit to supine: HOB elevated   General bed mobility comments: uses Hand rail heavily.    Transfers Overall transfer level: Needs assistance Equipment used: Rolling walker (2 wheels) Transfers: Sit to/from Stand Sit to Stand: Mod assist           General transfer comment: Retropulsion with standing and unsteady.    Ambulation/Gait Ambulation/Gait assistance: Max assist, Mod assist Gait Distance (Feet): 5 Feet Assistive device: Rolling walker (2 wheels)         General Gait Details: side stepping with Max VC and unsteady on B feet.  Stairs            Wheelchair Mobility     Tilt Bed    Modified Rankin (Stroke Patients Only)       Balance Overall balance assessment: History of Falls Sitting-balance support: Feet unsupported, Bilateral upper extremity supported Sitting balance-Leahy Scale: Poor Sitting balance - Comments: severe retropulsion until the4 B feet on floor with BUE supported it imporved to fair Postural control: Posterior lean Standing balance support: Bilateral upper extremity supported Standing balance-Leahy Scale: Poor Standing balance comment: LOB in standing with unsteadiness in BLE  Pertinent Vitals/Pain Pain Assessment Pain Assessment: No/denies pain    Home Living Family/patient expects to be discharged to:: Private residence Living Arrangements: Spouse/significant other Available Help at Discharge: Available PRN/intermittently;Family Type of Home: House Home Access: Level entry       Home Layout: One level Home Equipment: Agricultural consultant (2 wheels)      Prior Function Prior Level of Function :  Independent/Modified Independent             Mobility Comments: Fell 3 x in one week and prior to hospitalization otherwise Ind ambualtor at [employer] with FWW. ADLs Comments: IND per pt     Extremity/Trunk Assessment   Upper Extremity Assessment Upper Extremity Assessment: Defer to OT evaluation    Lower Extremity Assessment Lower Extremity Assessment: Generalized weakness       Communication   Communication Communication: No apparent difficulties Following commands: Follows multi-step commands inconsistently Cueing Techniques: Verbal cues  Cognition Arousal: Alert Behavior During Therapy: WFL for tasks assessed/performed Overall Cognitive Status: Within Functional Limits for tasks assessed                                 General Comments: Disoriented to Place and situation. .        General Comments General comments (skin integrity, edema, etc.): Bruises noted in BUE    Exercises     Assessment/Plan    PT Assessment Patient needs continued PT services  PT Problem List Decreased strength;Decreased activity tolerance;Decreased balance;Decreased coordination;Decreased safety awareness;Cardiopulmonary status limiting activity       PT Treatment Interventions Gait training;Functional mobility training;Therapeutic activities;Therapeutic exercise;Balance training;Neuromuscular re-education;Patient/family education;Cognitive remediation    PT Goals (Current goals can be found in the Care Plan section)  Acute Rehab PT Goals Patient Stated Goal: " Need to get stron so that I can go home." PT Goal Formulation: With patient/family Time For Goal Achievement: 01/23/23 Potential to Achieve Goals: Fair    Frequency Min 1X/week     Co-evaluation               AM-PAC PT "6 Clicks" Mobility  Outcome Measure Help needed turning from your back to your side while in a flat bed without using bedrails?: A Little Help needed moving from lying on  your back to sitting on the side of a flat bed without using bedrails?: A Lot Help needed moving to and from a bed to a chair (including a wheelchair)?: A Lot Help needed standing up from a chair using your arms (e.g., wheelchair or bedside chair)?: A Lot Help needed to walk in hospital room?: A Lot Help needed climbing 3-5 steps with a railing? : Total 6 Click Score: 12    End of Session Equipment Utilized During Treatment: Gait belt Activity Tolerance: Treatment limited secondary to medical complications (Comment) Patient left: in bed;with call bell/phone within reach;with family/visitor present;with bed alarm set Nurse Communication: Mobility status PT Visit Diagnosis: Unsteadiness on feet (R26.81);Repeated falls (R29.6);History of falling (Z91.81);Muscle weakness (generalized) (M62.81);Other abnormalities of gait and mobility (R26.89);Difficulty in walking, not elsewhere classified (R26.2)    Time: 0454-0981 PT Time Calculation (min) (ACUTE ONLY): 24 min   Charges:   PT Evaluation $PT Eval Moderate Complexity: 1 Mod PT Treatments $Therapeutic Activity: 8-22 mins PT General Charges $$ ACUTE PT VISIT: 1 Visit         Janet Berlin PT DPT 5:20 PM,01/09/23

## 2023-01-09 NOTE — Hospital Course (Signed)
Cody Howard is a 88 y.o. male with medical history significant of CKD stage IV with solitary kidney, PAF on Eliquis, HTN, HLD, IIDM, brought in by family member for evaluation of worsening of generalized weakness frequent falls.  Patient was found to have acute on chronic renal failure.  Patient recently was placed on Bactrim for treating cellulitis. He is seen by nephrology, started IV fluids for acute renal failure.  Patient renal function has back to baseline, seen by PT/OT, recommend nursing home placement.  Currently medically stable for discharge pending insurance approval.

## 2023-01-10 ENCOUNTER — Encounter: Payer: Self-pay | Admitting: Internal Medicine

## 2023-01-10 DIAGNOSIS — E875 Hyperkalemia: Secondary | ICD-10-CM

## 2023-01-10 DIAGNOSIS — E871 Hypo-osmolality and hyponatremia: Secondary | ICD-10-CM

## 2023-01-10 DIAGNOSIS — N171 Acute kidney failure with acute cortical necrosis: Secondary | ICD-10-CM | POA: Diagnosis not present

## 2023-01-10 DIAGNOSIS — E1122 Type 2 diabetes mellitus with diabetic chronic kidney disease: Secondary | ICD-10-CM | POA: Diagnosis not present

## 2023-01-10 DIAGNOSIS — D631 Anemia in chronic kidney disease: Secondary | ICD-10-CM | POA: Diagnosis not present

## 2023-01-10 DIAGNOSIS — N184 Chronic kidney disease, stage 4 (severe): Secondary | ICD-10-CM | POA: Diagnosis not present

## 2023-01-10 DIAGNOSIS — N179 Acute kidney failure, unspecified: Secondary | ICD-10-CM | POA: Diagnosis not present

## 2023-01-10 LAB — BASIC METABOLIC PANEL
Anion gap: 6 (ref 5–15)
Anion gap: 8 (ref 5–15)
BUN: 40 mg/dL — ABNORMAL HIGH (ref 8–23)
BUN: 39 mg/dL — ABNORMAL HIGH (ref 8–23)
CO2: 20 mmol/L — ABNORMAL LOW (ref 22–32)
CO2: 19 mmol/L — ABNORMAL LOW (ref 22–32)
Calcium: 8.4 mg/dL — ABNORMAL LOW (ref 8.9–10.3)
Calcium: 8.3 mg/dL — ABNORMAL LOW (ref 8.9–10.3)
Chloride: 112 mmol/L — ABNORMAL HIGH (ref 98–111)
Chloride: 107 mmol/L (ref 98–111)
Creatinine, Ser: 2.76 mg/dL — ABNORMAL HIGH (ref 0.61–1.24)
Creatinine, Ser: 2.62 mg/dL — ABNORMAL HIGH (ref 0.61–1.24)
GFR, Estimated: 22 mL/min — ABNORMAL LOW (ref >60)
GFR, Estimated: 23 mL/min — ABNORMAL LOW (ref >60)
Glucose, Bld: 133 mg/dL — ABNORMAL HIGH (ref 70–99)
Glucose, Bld: 260 mg/dL — ABNORMAL HIGH (ref 70–99)
Potassium: 5.6 mmol/L — ABNORMAL HIGH (ref 3.5–5.1)
Potassium: 4.7 mmol/L (ref 3.5–5.1)
Sodium: 138 mmol/L (ref 135–145)
Sodium: 134 mmol/L — ABNORMAL LOW (ref 135–145)

## 2023-01-10 LAB — MAGNESIUM: Magnesium: 1.8 mg/dL (ref 1.7–2.4)

## 2023-01-10 LAB — GLUCOSE, CAPILLARY
Glucose-Capillary: 132 mg/dL — ABNORMAL HIGH (ref 70–99)
Glucose-Capillary: 183 mg/dL — ABNORMAL HIGH (ref 70–99)
Glucose-Capillary: 176 mg/dL — ABNORMAL HIGH (ref 70–99)
Glucose-Capillary: 150 mg/dL — ABNORMAL HIGH (ref 70–99)

## 2023-01-10 MED ORDER — PANCRELIPASE (LIP-PROT-AMYL) 12000-38000 UNITS PO CPEP
24000.0000 [IU] | ORAL_CAPSULE | Freq: Three times a day (TID) | ORAL | Status: DC
  Administered 2023-01-10 – 2023-01-14 (×12): 24000 [IU] via ORAL
  Filled 2023-01-10 (×12): qty 2

## 2023-01-10 MED ORDER — SODIUM ZIRCONIUM CYCLOSILICATE 10 G PO PACK
10.0000 g | PACK | Freq: Once | ORAL | Status: AC
  Administered 2023-01-10: 10 g via ORAL
  Filled 2023-01-10: qty 1

## 2023-01-10 MED ORDER — SODIUM ZIRCONIUM CYCLOSILICATE 5 G PO PACK
5.0000 g | PACK | Freq: Once | ORAL | Status: DC
  Filled 2023-01-10: qty 1

## 2023-01-10 NOTE — TOC Progression Note (Addendum)
Transition of Care [facility]) - Progression Note    Patient Details  Name: Cody Howard MRN: [id number] Date of Birth: 1935-02-20  [facility] of [facility]) CM/SW Contact  Allena Katz, LCSW Phone Number: 01/10/2023, 9:39 AM  Clinical Narrative:   CSW spoke with son who reports he would like him to go to [facility], [place] or [place].   12:27PM  Reviewed bed offers with son. Son would like to go to [facility]. CSW messaged Tiffanie with [facility] to see if we can start.        Expected Discharge Plan and Services                                               Social Determinants of Health (SDOH) Interventions SDOH Screenings   Food Insecurity: No Food Insecurity (01/10/2023)  Housing: Low Risk  (01/10/2023)  Transportation Needs: No Transportation Needs (01/10/2023)  Utilities: Not At Risk (01/10/2023)  Alcohol Screen: Low Risk  (07/07/2022)  Depression (PHQ2-9): Low Risk  (12/31/2022)  Financial Resource Strain: Low Risk  (07/07/2022)  Physical Activity: Insufficiently Active (07/07/2022)  Social Connections: Moderately Isolated (07/07/2022)  Stress: No Stress Concern Present (07/07/2022)  Tobacco Use: Medium Risk (01/10/2023)    Readmission Risk Interventions     No data to display

## 2023-01-10 NOTE — Progress Notes (Signed)
[facility]  PROGRESS NOTE   Subjective:   Patient seen laying in bed Alert and oriented  Family at bedside States he feels well today Denies pain or discomfort Tolerating small meals.   Creatinine 2.76 UOP  Objective:  Vital signs: Blood pressure (!) 179/71, pulse 72, temperature 97.8 F (36.6 C), temperature source Oral, resp. rate 20, height 5\' 11"  (1.803 m), weight 82.4 kg, SpO2 95%.  Intake/Output Summary (Last 24 hours) at 01/10/2023 1148 Last data filed at 01/10/2023 0203 Gross per 24 hour  Intake --  Output 1025 ml  Net -1025 ml   Filed Weights   01/09/23 2028 01/10/23 0519 01/10/23 0538  Weight: 86.2 kg 82.4 kg 82.4 kg     Physical Exam: General:  No acute distress  Head:  Normocephalic, atraumatic. Moist oral mucosal membranes  Eyes:  Anicteric  Lungs:   Clear to auscultation, normal effort  Heart:  S1S2 no rubs  Abdomen:   Soft, nontender, bowel sounds present  Extremities:  No peripheral edema.  Neurologic:  Awake, alert, following commands  Skin:  No lesions  Access: None    Basic Metabolic Panel: Recent Labs  Lab 01/08/23 1117 01/09/23 0326 01/10/23 0306  NA 134* 136 138  K 5.0 4.8 5.6*  CL 110 114* 112*  CO2 15* 19* 20*  GLUCOSE 146* 136* 133*  BUN 42* 41* 40*  CREATININE 3.31* 3.09* 2.76*  CALCIUM 8.4* 7.9* 8.4*  MG  --   --  1.8   GFR: Estimated Creatinine Clearance: 20.8 mL/min (A) (by C-G formula based on SCr of 2.76 mg/dL (H)).  Liver Function Tests: No results for input(s): "AST", "ALT", "ALKPHOS", "BILITOT", "PROT", "ALBUMIN" in the last 168 hours. No results for input(s): "LIPASE", "AMYLASE" in the last 168 hours. No results for input(s): "AMMONIA" in the last 168 hours.  CBC: Recent Labs  Lab 01/08/23 1117 01/09/23 0326  WBC 10.8* 7.2  HGB 11.8* 10.0*  HCT 34.7* 29.4*  MCV 102.4* 96.4  PLT 158 112*     HbA1C: Hemoglobin A1C  Date/Time Value Ref Range Status  09/24/2022 12:00 AM 7.4  Final   09/21/2021 12:00 AM 6.7  Final    Urinalysis: Recent Labs    01/08/23 1326  COLORURINE YELLOW*  LABSPEC 1.014  PHURINE 5.0  GLUCOSEU NEGATIVE  HGBUR SMALL*  BILIRUBINUR NEGATIVE  KETONESUR NEGATIVE  PROTEINUR >=300*  NITRITE NEGATIVE  LEUKOCYTESUR NEGATIVE      Imaging: US RENAL  Result Date: 01/09/2023 CLINICAL DATA:  [id number] AKI (acute kidney injury) (HCC) [id number] EXAM: RENAL / URINARY TRACT ULTRASOUND COMPLETE COMPARISON:  12/19/2018, 07/30/2016 FINDINGS: Right Kidney: Renal measurements: 12.0 x 5.4 x 4.8 cm = volume: 165 mL. Mildly increased renal cortical echogenicity. Moderate hydronephrosis. No mass or shadowing stone is visualized. Left Kidney: Renal measurements: 10.9 x 5.0 x 5.3 cm = volume: 152 mL. Mildly increased renal cortical echogenicity. No mass, shadowing stone, or hydronephrosis visualized. Bladder: Appears normal for degree of bladder distention. Other: None. IMPRESSION: 1. Moderate right hydronephrosis. A similar finding was present on the previous ultrasound from 2020. 2. Mildly increased renal cortical echogenicity bilaterally, as can be seen in the setting of medical renal disease. Electronically Signed   By: Duanne Guess D.O.   On: 01/09/2023 16:32     Medications:     acidophilus  1 capsule Oral QHS   apixaban  2.5 mg Oral BID   atorvastatin  40 mg Oral Daily   doxycycline  100 mg Oral Q12H  ferrous sulfate  325 mg Oral Q breakfast   insulin aspart  0-9 Units Subcutaneous TID WC   montelukast  10 mg Oral QHS   mupirocin ointment  1 Application Topical BID   nystatin cream  1 Application Topical BID   sertraline  25 mg Oral Daily   sodium bicarbonate  650 mg Oral BID    Assessment/ Plan:     88 y.o. male with a PMHx of solitary kidney, chronic kidney disease stage IIIb, diabetes, hypertension, hyperlipidemia, now admitted with history of frequent falls and generalized weakness.  He is found to have acute kidney injury on chronic kidney  disease.  Patient is being treated with sulfamethoxazole and trimethoprim as outpatient.   Principal Problem:   AKI (acute kidney injury) (HCC)   #1: Acute kidney injury on chronic kidney disease stage 4: This is most likely secondary to prerenal azotemia.  Renal ultrasound shows moderate right hydronephrosis, similar to findings in 2020. Does also confirm presence of right and left kidney's, contradicting patient's stated history of kidney removal earlier in life. Creatinine has improved to baseline.  Oral intake adequate.    #2: Metabolic acidosis: Acidosis most likely secondary to sepsis.  Corrected   #3: Anemia secondary to chronic kidney disease versus iron deficiency.  Hgb 10.0, decreased from admission. Continue oral iron supplementation.   #4: Scrotal cellulitis: Discontinue Bactrim. Continue doxycycline.  #5: Leukocytosis: WBC corrected   #6: Diabetes mellitus type II with chronic kidney disease/renal manifestations: noninsulin dependent.  Most recent hemoglobin A1c is 7.4 on 09/24/22.   Glucose elevated at times. Primary team will manage SSI    LOS: 0 [facility] 11/4/202411:48 AM

## 2023-01-10 NOTE — Plan of Care (Signed)
  Problem: Education: Goal: Ability to describe self-care measures that may prevent or decrease complications (Diabetes Survival Skills Education) will improve Outcome: Progressing   Problem: Coping: Goal: Ability to adjust to condition or change in health will improve Outcome: Progressing   Problem: Health Behavior/Discharge Planning: Goal: Ability to identify and utilize available resources and services will improve Outcome: Progressing   Problem: Nutritional: Goal: Maintenance of adequate nutrition will improve Outcome: Progressing   Problem: Skin Integrity: Goal: Risk for impaired skin integrity will decrease Outcome: Progressing   

## 2023-01-10 NOTE — Care Management Obs Status (Signed)
MEDICARE OBSERVATION STATUS NOTIFICATION   Patient Details  Name: Cody Howard MRN: [id number] Date of Birth: 1934-07-07   Medicare Observation Status Notification Given:  Yes    Aster Eckrich, LCSW 01/10/2023, 9:34 AM

## 2023-01-10 NOTE — Progress Notes (Signed)
  Progress Note   Patient: Cody Howard OZD:[id number] DOB: 10-02-1934 DOA: 01/08/2023     0 DOS: the patient was seen and examined on 01/10/2023   Brief hospital course: Cody Howard is a 88 y.o. male with medical history significant of CKD stage IV with solitary kidney, PAF on Eliquis, HTN, HLD, IIDM, brought in by family member for evaluation of worsening of generalized weakness frequent falls.  Patient was found to have acute on chronic renal failure.  Patient recently was placed on Bactrim for treating cellulitis. He is seen by nephrology, started IV fluids for acute renal failure.   Principal Problem:   Acute renal failure superimposed on stage 4 chronic kidney disease (HCC) Active Problems:   Type II diabetes mellitus with renal manifestations (HCC)   Atrial fibrillation, chronic (HCC)   Metabolic acidosis   Hyponatremia   Assessment and Plan: Acute kidney injury on chronic kidney disease stage IV. Metabolic acidosis  Mild hyponatremia. Hyperkalemia. Patient had a worsening renal function after taking Bactrim. He has been treated with IV fluids, seen by nephrology. Patient renal function seem to improved to baseline, discontinue IV fluids.  Potassium went up to 5.6, give 10 g of Lokelma. Renal ultrasound performed, showed moderate to right-sided hydronephrosis which appears to be present in 2018. Also confirmed that patient does have both kidneys which has been confirmed in ultrasound yesterday and ultrasound in  2018.    Scrotal cellulitis. Bactrim discontinued, doxycycline started.   Chronic atrial fibrillation. Rate under control, continue Eliquis.   Severe debility.  Frequent falls. Patient has been seen by PT/OT, recommending nursing home placement.   Type 2 diabetes  Hold off oral diabetic medications, continue sliding scale insulin.   Essential hypertension.    hold off ARB.         Subjective:  Patient feels better today, denied any shortness  of breath.  Physical Exam: Vitals:   01/10/23 0433 01/10/23 0519 01/10/23 0538 01/10/23 0806  BP: (!) 149/73 (!) 175/68  (!) 179/71  Pulse:  69  72  Resp:    20  Temp:    97.8 F (36.6 C)  TempSrc:    Oral  SpO2:  95%  95%  Weight:  82.4 kg 82.4 kg   Height:  5\' 11"  (1.803 m) 5\' 11"  (1.803 m)    General exam: Appears calm and comfortable  Respiratory system: Clear to auscultation. Respiratory effort normal. Cardiovascular system: S1 & S2 heard, RRR. No JVD, murmurs, rubs, gallops or clicks. No pedal edema. Gastrointestinal system: Abdomen is nondistended, soft and nontender. No organomegaly or masses felt. Normal bowel sounds heard. Central nervous system: Alert and oriented x3. No focal neurological deficits. Extremities: Symmetric 5 x 5 power. Skin: No rashes, lesions or ulcers Psychiatry: Judgement and insight appear normal. Mood & affect appropriate.    Data Reviewed:  Severity of disease.  Family Communication: Son updated at the bedside.  Disposition: Status is: Observation      Time spent: 35 minutes  Author: Marrion Coy, MD 01/10/2023 12:24 PM  For on call review [email or web address].

## 2023-01-10 NOTE — NC FL2 (Signed)
MEDICAID FL2 LEVEL OF CARE FORM     IDENTIFICATION  Patient Name: Cody Howard Birthdate: 04-14-1934 Sex: male Admission Date (Current Location): 01/08/2023  [facility] and [place] Number:  Chiropodist and Address:  [facility], [address] [place], [place] [address]      Provider Number: [id number]  Attending Physician Name and Address:  Marrion Coy, MD  Relative Name and Phone Number:  Brandt, Chaney (Spouse)  [phone]    Current Level of Care: Hospital Recommended Level of Care: Skilled Nursing Facility Prior Approval Number:    Date Approved/Denied:   PASRR Number: [id number]  Discharge Plan: SNF    Current Diagnoses: Patient Active Problem List   Diagnosis Date Noted   Metabolic acidosis 01/09/2023   Hyponatremia 01/09/2023   Acute renal failure superimposed on stage 4 chronic kidney disease (HCC) 01/08/2023   Acute rhinosinusitis 12/08/2020   Difficulty walking 04/24/2020   Sleeping difficulty 04/24/2020   Moderate mitral regurgitation 09/12/2019   Status post hip hemiarthroplasty 06/28/2019   Atrial fibrillation, chronic (HCC) 06/27/2019   Depression 06/27/2019   Fall at home, initial encounter 06/27/2019   Closed displaced fracture of left femoral neck (HCC) 06/27/2019   History of anemia 05/31/2019   Benign prostatic hyperplasia with lower urinary tract symptoms 05/31/2019   Cerebral atrophy, mild (HCC) 05/31/2019   Reactive depression 05/31/2019   Essential hypertension 05/31/2019   Hiatal hernia 05/31/2019   Cardiac syncope 02/20/2019   Loss of memory 05/25/2018   Dizziness 04/12/2018   TIA (transient ischemic attack) 01/25/2018   Chronic systolic CHF (congestive heart failure), NYHA class 3 (HCC) 11/29/2017   CKD (chronic kidney disease) stage 3, GFR 30-59 ml/min (HCC) 08/18/2017   Bradycardia 08/18/2017   Atrial flutter, paroxysmal (HCC) 07/21/2017   Functional dyspnea 07/06/2017    Cardiomyopathy (HCC) 07/06/2017   Hyperlipidemia, mixed 06/20/2017   Hyperlipidemia associated with type 2 diabetes mellitus (HCC) 04/06/2016   Type II diabetes mellitus with renal manifestations (HCC) 02/10/2015   Personal history of other diseases of male genital organs 02/10/2015   Sebaceous cyst 08/28/2014    Orientation RESPIRATION BLADDER Height & Weight     Place, Situation, Self  Normal Incontinent Weight: 181 lb 10.5 oz (82.4 kg) Height:  5\' 11"  (180.3 cm)  BEHAVIORAL SYMPTOMS/MOOD NEUROLOGICAL BOWEL NUTRITION STATUS      Continent    AMBULATORY STATUS COMMUNICATION OF NEEDS Skin   Extensive Assist Verbally Normal                       Personal Care Assistance Level of Assistance  Bathing, Feeding, Dressing Bathing Assistance: Maximum assistance Feeding assistance: Limited assistance Dressing Assistance: Maximum assistance     Functional Limitations Info  Sight, Hearing, Speech Sight Info: Adequate Hearing Info: Adequate Speech Info: Adequate    SPECIAL CARE FACTORS FREQUENCY  OT (By licensed OT), PT (By licensed PT)     PT Frequency: 5 times a week OT Frequency: 5 times a week            Contractures Contractures Info: Not present    Additional Factors Info  Code Status Code Status Info: FULL             Current Medications (01/10/2023):  This is the current hospital active medication list Current Facility-Administered Medications  Medication Dose Route Frequency Provider Last Rate Last Admin   acetaminophen (TYLENOL) tablet 650 mg  650 mg Oral Q6H PRN Emeline General, MD  Or   acetaminophen (TYLENOL) suppository 650 mg  650 mg Rectal Q6H PRN Mikey College T, MD       acidophilus (RISAQUAD) capsule 1 capsule  1 capsule Oral QHS Mikey College T, MD   1 capsule at 01/09/23 2202   apixaban (ELIQUIS) tablet 2.5 mg  2.5 mg Oral BID Mikey College T, MD   2.5 mg at 01/10/23 0903   atorvastatin (LIPITOR) tablet 40 mg  40 mg Oral Daily Mikey College T, MD    40 mg at 01/10/23 0904   chlorhexidine (HIBICLENS) 4 % liquid   Topical Daily PRN Mikey College T, MD       doxycycline (VIBRA-TABS) tablet 100 mg  100 mg Oral Q12H Mikey College T, MD   100 mg at 01/10/23 4098   ferrous sulfate tablet 325 mg  325 mg Oral Q breakfast Mikey College T, MD   325 mg at 01/10/23 1191   hydrALAZINE (APRESOLINE) tablet 10 mg  10 mg Oral Q6H PRN Mikey College T, MD   10 mg at 01/10/23 0203   insulin aspart (novoLOG) injection 0-9 Units  0-9 Units Subcutaneous TID WC Emeline General, MD   1 Units at 01/10/23 0905   montelukast (SINGULAIR) tablet 10 mg  10 mg Oral QHS Mikey College T, MD   10 mg at 01/09/23 2202   mupirocin ointment (BACTROBAN) 2 % 1 Application  1 Application Topical BID Emeline General, MD   1 Application at 01/09/23 2248   nystatin cream (MYCOSTATIN) 1 Application  1 Application Topical BID Emeline General, MD   1 Application at 01/09/23 2248   ondansetron (ZOFRAN) tablet 4 mg  4 mg Oral Q6H PRN Mikey College T, MD       Or   ondansetron [facility]) injection 4 mg  4 mg Intravenous Q6H PRN Mikey College T, MD       sertraline (ZOLOFT) tablet 25 mg  25 mg Oral Daily Mikey College T, MD   25 mg at 01/10/23 4782   sodium bicarbonate tablet 650 mg  650 mg Oral BID Emeline General, MD   650 mg at 01/10/23 9562     Discharge Medications: Please see discharge summary for a list of discharge medications.  Relevant Imaging Results:  Relevant Lab Results:   Additional Information SS# [id number]  Allena Katz, LCSW

## 2023-01-11 DIAGNOSIS — N184 Chronic kidney disease, stage 4 (severe): Secondary | ICD-10-CM | POA: Diagnosis not present

## 2023-01-11 DIAGNOSIS — E872 Acidosis, unspecified: Secondary | ICD-10-CM | POA: Diagnosis not present

## 2023-01-11 DIAGNOSIS — N179 Acute kidney failure, unspecified: Secondary | ICD-10-CM | POA: Diagnosis not present

## 2023-01-11 DIAGNOSIS — N171 Acute kidney failure with acute cortical necrosis: Secondary | ICD-10-CM | POA: Diagnosis not present

## 2023-01-11 DIAGNOSIS — E1122 Type 2 diabetes mellitus with diabetic chronic kidney disease: Secondary | ICD-10-CM | POA: Diagnosis not present

## 2023-01-11 DIAGNOSIS — I482 Chronic atrial fibrillation, unspecified: Secondary | ICD-10-CM | POA: Diagnosis not present

## 2023-01-11 LAB — BASIC METABOLIC PANEL
Anion gap: 9 (ref 5–15)
BUN: 41 mg/dL — ABNORMAL HIGH (ref 8–23)
CO2: 18 mmol/L — ABNORMAL LOW (ref 22–32)
Calcium: 8.3 mg/dL — ABNORMAL LOW (ref 8.9–10.3)
Chloride: 108 mmol/L (ref 98–111)
Creatinine, Ser: 2.54 mg/dL — ABNORMAL HIGH (ref 0.61–1.24)
GFR, Estimated: 24 mL/min — ABNORMAL LOW (ref >60)
Glucose, Bld: 157 mg/dL — ABNORMAL HIGH (ref 70–99)
Potassium: 4.5 mmol/L (ref 3.5–5.1)
Sodium: 135 mmol/L (ref 135–145)

## 2023-01-11 LAB — GLUCOSE, CAPILLARY
Glucose-Capillary: 146 mg/dL — ABNORMAL HIGH (ref 70–99)
Glucose-Capillary: 244 mg/dL — ABNORMAL HIGH (ref 70–99)
Glucose-Capillary: 214 mg/dL — ABNORMAL HIGH (ref 70–99)
Glucose-Capillary: 171 mg/dL — ABNORMAL HIGH (ref 70–99)
Glucose-Capillary: 304 mg/dL — ABNORMAL HIGH (ref 70–99)
Glucose-Capillary: 214 mg/dL — ABNORMAL HIGH (ref 70–99)

## 2023-01-11 MED ORDER — SODIUM BICARBONATE 650 MG PO TABS
1300.0000 mg | ORAL_TABLET | Freq: Three times a day (TID) | ORAL | Status: DC
  Administered 2023-01-11 – 2023-01-13 (×6): 1300 mg via ORAL
  Filled 2023-01-11 (×6): qty 2

## 2023-01-11 MED ORDER — AMLODIPINE BESYLATE 5 MG PO TABS
5.0000 mg | ORAL_TABLET | Freq: Every day | ORAL | Status: DC
  Administered 2023-01-11 – 2023-01-14 (×4): 5 mg via ORAL
  Filled 2023-01-11 (×4): qty 1

## 2023-01-11 NOTE — Progress Notes (Signed)
[facility]  PROGRESS NOTE   Subjective:   Patient seen sitting up in bed, alert and oriented Son at bedside Denies nausea or vomiting  Creatinine 2.54 UOP  Objective:  Vital signs: Blood pressure (!) 155/61, pulse 88, temperature 98 F (36.7 C), temperature source Oral, resp. rate 18, height 5\' 11"  (1.803 m), weight 82.4 kg, SpO2 97%.  Intake/Output Summary (Last 24 hours) at 01/11/2023 1142 Last data filed at 01/11/2023 1031 Gross per 24 hour  Intake 240 ml  Output 2650 ml  Net -2410 ml   Filed Weights   01/09/23 2028 01/10/23 0519 01/10/23 0538  Weight: 86.2 kg 82.4 kg 82.4 kg     Physical Exam: General:  No acute distress  Head:  Normocephalic, atraumatic. Moist oral mucosal membranes  Eyes:  Anicteric  Lungs:   Clear to auscultation, normal effort  Heart:  S1S2 no rubs  Abdomen:   Soft, nontender, bowel sounds present  Extremities:  No peripheral edema.  Neurologic:  Awake, alert, following commands  Skin:  No lesions  Access: None    Basic Metabolic Panel: Recent Labs  Lab 01/08/23 1117 01/09/23 0326 01/10/23 0306 01/10/23 1300 01/11/23 0527  NA 134* 136 138 134* 135  K 5.0 4.8 5.6* 4.7 4.5  CL 110 114* 112* 107 108  CO2 15* 19* 20* 19* 18*  GLUCOSE 146* 136* 133* 260* 157*  BUN 42* 41* 40* 39* 41*  CREATININE 3.31* 3.09* 2.76* 2.62* 2.54*  CALCIUM 8.4* 7.9* 8.4* 8.3* 8.3*  MG  --   --  1.8  --   --    GFR: Estimated Creatinine Clearance: 22.6 mL/min (A) (by C-G formula based on SCr of 2.54 mg/dL (H)).  Liver Function Tests: No results for input(s): "AST", "ALT", "ALKPHOS", "BILITOT", "PROT", "ALBUMIN" in the last 168 hours. No results for input(s): "LIPASE", "AMYLASE" in the last 168 hours. No results for input(s): "AMMONIA" in the last 168 hours.  CBC: Recent Labs  Lab 01/08/23 1117 01/09/23 0326  WBC 10.8* 7.2  HGB 11.8* 10.0*  HCT 34.7* 29.4*  MCV 102.4* 96.4  PLT 158 112*     HbA1C: Hemoglobin A1C   Date/Time Value Ref Range Status  09/24/2022 12:00 AM 7.4  Final  09/21/2021 12:00 AM 6.7  Final    Urinalysis: Recent Labs    01/08/23 1326  COLORURINE YELLOW*  LABSPEC 1.014  PHURINE 5.0  GLUCOSEU NEGATIVE  HGBUR SMALL*  BILIRUBINUR NEGATIVE  KETONESUR NEGATIVE  PROTEINUR >=300*  NITRITE NEGATIVE  LEUKOCYTESUR NEGATIVE      Imaging: US RENAL  Result Date: 01/09/2023 CLINICAL DATA:  [id number] AKI (acute kidney injury) (HCC) [id number] EXAM: RENAL / URINARY TRACT ULTRASOUND COMPLETE COMPARISON:  12/19/2018, 07/30/2016 FINDINGS: Right Kidney: Renal measurements: 12.0 x 5.4 x 4.8 cm = volume: 165 mL. Mildly increased renal cortical echogenicity. Moderate hydronephrosis. No mass or shadowing stone is visualized. Left Kidney: Renal measurements: 10.9 x 5.0 x 5.3 cm = volume: 152 mL. Mildly increased renal cortical echogenicity. No mass, shadowing stone, or hydronephrosis visualized. Bladder: Appears normal for degree of bladder distention. Other: None. IMPRESSION: 1. Moderate right hydronephrosis. A similar finding was present on the previous ultrasound from 2020. 2. Mildly increased renal cortical echogenicity bilaterally, as can be seen in the setting of medical renal disease. Electronically Signed   By: Duanne Guess D.O.   On: 01/09/2023 16:32     Medications:     acidophilus  1 capsule Oral QHS   amLODipine  5 mg  Oral Daily   apixaban  2.5 mg Oral BID   atorvastatin  40 mg Oral Daily   ferrous sulfate  325 mg Oral Q breakfast   insulin aspart  0-9 Units Subcutaneous TID WC   lipase/protease/amylase  24,000 Units Oral TID AC   montelukast  10 mg Oral QHS   mupirocin ointment  1 Application Topical BID   nystatin cream  1 Application Topical BID   sertraline  25 mg Oral Daily   sodium bicarbonate  1,300 mg Oral TID    Assessment/ Plan:     88 y.o. male with a PMHx of solitary kidney, chronic kidney disease stage IIIb, diabetes, hypertension, hyperlipidemia, now admitted  with history of frequent falls and generalized weakness.  He is found to have acute kidney injury on chronic kidney disease.  Patient is being treated with sulfamethoxazole and trimethoprim as outpatient.   Principal Problem:   AKI (acute kidney injury) (HCC)   #1: Acute kidney injury on chronic kidney disease stage 4: This is most likely secondary to prerenal azotemia.  Renal ultrasound shows moderate right hydronephrosis, similar to findings in 2020. Does also confirm presence of right and left kidney's, contradicting patient's stated history of kidney removal earlier in life.  Creatinine remains at baseline.  We will schedule an appointment with Dr. Cherylann Ratel at discharge for follow-up.   #2: Metabolic acidosis: Acidosis most likely secondary to sepsis.  Corrected   #3: Anemia secondary to chronic kidney disease versus iron deficiency.  Hgb 10.0, decreased from admission. Continue oral iron supplementation.   #4: Scrotal cellulitis: Continue doxycycline.  #5: Leukocytosis: WBC corrected   #6: Diabetes mellitus type II with chronic kidney disease/renal manifestations: noninsulin dependent.  Most recent hemoglobin A1c is 7.4 on 09/24/22.   Primary team will manage SSI    LOS: 0 [facility] 11/5/202411:42 AM

## 2023-01-11 NOTE — TOC Progression Note (Signed)
Transition of Care [facility]) - Progression Note    Patient Details  Name: Cody Howard MRN: [id number] Date of Birth: 1934/08/01  Transition of Care [facility]) CM/SW Contact  Allena Katz, LCSW Phone Number: 01/11/2023, 10:39 AM  Clinical Narrative:   [facility] at  [facility] has started auth for facility.         Expected Discharge Plan and Services                                               Social Determinants of Health (SDOH) Interventions SDOH Screenings   Food Insecurity: No Food Insecurity (01/10/2023)  Housing: Low Risk  (01/10/2023)  Transportation Needs: No Transportation Needs (01/10/2023)  Utilities: Not At Risk (01/10/2023)  Alcohol Screen: Low Risk  (07/07/2022)  Depression (PHQ2-9): Low Risk  (12/31/2022)  Financial Resource Strain: Low Risk  (07/07/2022)  Physical Activity: Insufficiently Active (07/07/2022)  Social Connections: Moderately Isolated (07/07/2022)  Stress: No Stress Concern Present (07/07/2022)  Tobacco Use: Medium Risk (01/10/2023)    Readmission Risk Interventions     No data to display

## 2023-01-11 NOTE — Plan of Care (Signed)
  Problem: Education: Goal: Ability to describe self-care measures that may prevent or decrease complications (Diabetes Survival Skills Education) will improve Outcome: Progressing   Problem: Coping: Goal: Ability to adjust to condition or change in health will improve Outcome: Progressing   Problem: Health Behavior/Discharge Planning: Goal: Ability to identify and utilize available resources and services will improve Outcome: Progressing   Problem: Nutritional: Goal: Maintenance of adequate nutrition will improve Outcome: Progressing   Problem: Skin Integrity: Goal: Risk for impaired skin integrity will decrease Outcome: Progressing   

## 2023-01-11 NOTE — Progress Notes (Addendum)
  Progress Note   Patient: Cody Howard:[id number] DOB: Oct 20, 1934 DOA: 01/08/2023     0 DOS: the patient was seen and examined on 01/11/2023   Brief hospital course: KHRIZ LIDDY is a 88 y.o. male with medical history significant of CKD stage IV with solitary kidney, PAF on Eliquis, HTN, HLD, IIDM, brought in by family member for evaluation of worsening of generalized weakness frequent falls.  Patient was found to have acute on chronic renal failure.  Patient recently was placed on Bactrim for treating cellulitis. He is seen by nephrology, started IV fluids for acute renal failure.  Patient renal function has back to baseline, seen by PT/OT, recommend nursing home placement.  Currently medically stable for discharge pending insurance approval.   Principal Problem:   Acute renal failure superimposed on stage 4 chronic kidney disease (HCC) Active Problems:   Type II diabetes mellitus with renal manifestations (HCC)   Atrial fibrillation, chronic (HCC)   Metabolic acidosis   Hyponatremia   Hyperkalemia   Assessment and Plan: Acute kidney injury on chronic kidney disease stage IV. Metabolic acidosis  Mild hyponatremia. Hyperkalemia. Patient had worsening renal function after taking Bactrim, but he also had a diarrhea.  Condition many caused by dehydration. He has been treated with IV fluids, seen by nephrology. Patient renal function has improved to baseline.  Potassium went up to 5.6 on 11/4, potassium normalized after giving 10 g of Lokelma. Renal ultrasound performed, showed moderate to right-sided hydronephrosis which appears to be present in 2018. Also confirmed that patient does have both kidneys which has been confirmed in ultrasound yesterday and ultrasound in  2018. Patient still has multiple acidosis from CKD, increase sodium bicarb and oral doses.  Recheck BMP tomorrow.  Chronic diarrhea. Probably from malnutrition, started on Creon, diarrhea seem to be stopped.    Scrotal cellulitis. Bactrim discontinued, doxycycline complete   Chronic atrial fibrillation. Rate under control, continue Eliquis.   Severe debility.  Frequent falls. Patient has been seen by PT/OT, recommending nursing home placement.   Type 2 diabetes  Hold off oral diabetic medications, continue sliding scale insulin.   Essential hypertension.    hold off ARB.  Pressure running high, added amlodipine.      Subjective:  Patient doing well,  Physical Exam: Vitals:   01/10/23 1619 01/10/23 1953 01/11/23 0446 01/11/23 0819  BP: (!) 171/72 (!) 151/79 (!) 172/74 (!) 155/61  Pulse: 66 77 71 88  Resp: 16 18 18 18   Temp: 97.6 F (36.4 C) 98.6 F (37 C) 98.7 F (37.1 C) 98 F (36.7 C)  TempSrc:    Oral  SpO2: 97% 97% 95% 97%  Weight:      Height:       General exam: Appears calm and comfortable  Respiratory system: Clear to auscultation. Respiratory effort normal. Cardiovascular system: Irregular. No JVD, murmurs, rubs, gallops or clicks. No pedal edema. Gastrointestinal system: Abdomen is nondistended, soft and nontender. No organomegaly or masses felt. Normal bowel sounds heard. Central nervous system: Alert and oriented x3. No focal neurological deficits. Extremities: Symmetric 5 x 5 power. Skin: No rashes, lesions or ulcers Psychiatry: Judgement and insight appear normal. Mood & affect appropriate.    Data Reviewed:  Lab results reviewed.  Family Communication: Son updated at the bedside.  Disposition: Status is: Observation      Time spent: 35 minutes  Author: Marrion Coy, MD 01/11/2023 10:38 AM  For on call review [email or web address].

## 2023-01-11 NOTE — Plan of Care (Signed)
  Problem: Clinical Measurements: Goal: Will remain free from infection Outcome: Progressing Goal: Diagnostic test results will improve Outcome: Progressing   Problem: Activity: Goal: Risk for activity intolerance will decrease Outcome: Progressing   Problem: Elimination: Goal: Will not experience complications related to bowel motility Outcome: Progressing   Problem: Pain Management: Goal: General experience of comfort will improve Outcome: Progressing   Problem: Safety: Goal: Ability to remain free from injury will improve Outcome: Progressing   Problem: Skin Integrity: Goal: Risk for impaired skin integrity will decrease Outcome: Progressing

## 2023-01-12 DIAGNOSIS — N184 Chronic kidney disease, stage 4 (severe): Secondary | ICD-10-CM | POA: Diagnosis not present

## 2023-01-12 DIAGNOSIS — N179 Acute kidney failure, unspecified: Secondary | ICD-10-CM | POA: Diagnosis not present

## 2023-01-12 LAB — BASIC METABOLIC PANEL
Anion gap: 6 (ref 5–15)
BUN: 54 mg/dL — ABNORMAL HIGH (ref 8–23)
CO2: 22 mmol/L (ref 22–32)
Calcium: 8.2 mg/dL — ABNORMAL LOW (ref 8.9–10.3)
Chloride: 109 mmol/L (ref 98–111)
Creatinine, Ser: 2.71 mg/dL — ABNORMAL HIGH (ref 0.61–1.24)
GFR, Estimated: 22 mL/min — ABNORMAL LOW (ref >60)
Glucose, Bld: 174 mg/dL — ABNORMAL HIGH (ref 70–99)
Potassium: 4.5 mmol/L (ref 3.5–5.1)
Sodium: 137 mmol/L (ref 135–145)

## 2023-01-12 LAB — GLUCOSE, CAPILLARY
Glucose-Capillary: 165 mg/dL — ABNORMAL HIGH (ref 70–99)
Glucose-Capillary: 250 mg/dL — ABNORMAL HIGH (ref 70–99)
Glucose-Capillary: 222 mg/dL — ABNORMAL HIGH (ref 70–99)
Glucose-Capillary: 236 mg/dL — ABNORMAL HIGH (ref 70–99)

## 2023-01-12 MED ORDER — INSULIN ASPART 100 UNIT/ML IJ SOLN
2.0000 [IU] | Freq: Three times a day (TID) | INTRAMUSCULAR | Status: DC
  Administered 2023-01-12 – 2023-01-14 (×7): 2 [IU] via SUBCUTANEOUS
  Filled 2023-01-12 (×7): qty 1

## 2023-01-12 MED ORDER — INSULIN GLARGINE-YFGN 100 UNIT/ML ~~LOC~~ SOLN
5.0000 [IU] | Freq: Every day | SUBCUTANEOUS | Status: DC
  Administered 2023-01-12: 5 [IU] via SUBCUTANEOUS
  Filled 2023-01-12: qty 0.05

## 2023-01-12 NOTE — Progress Notes (Addendum)
  Progress Note   Patient: Cody Howard XBJ:[id number] DOB: [birth year] DOA: 01/08/2023     0 DOS: the patient was seen and examined on 01/12/2023   Brief hospital course: Cody Howard is a 88 y.o. male with medical history significant of CKD stage IV, PAF on Eliquis, HTN, HLD, IIDM, brought in by family member for evaluation of worsening of generalized weakness frequent falls.  Patient was found to have acute on chronic renal failure.  Patient recently was placed on Bactrim for treating cellulitis. He is seen by nephrology, started IV fluids for acute renal failure.  Patient renal function has back to baseline, seen by PT/OT, recommend nursing home placement.  Currently medically stable for discharge pending insurance approval.   Principal Problem:   Acute renal failure superimposed on stage 4 chronic kidney disease (HCC) Active Problems:   Type II diabetes mellitus with renal manifestations (HCC)   Atrial fibrillation, chronic (HCC)   Metabolic acidosis   Hyponatremia   Hyperkalemia   Assessment and Plan: Acute kidney injury on chronic kidney disease stage IV. Metabolic acidosis  Mild hyponatremia. Hyperkalemia. Patient had worsening renal function after taking Bactrim, but he also had a diarrhea.  Condition many caused by dehydration. He has been treated with IV fluids, seen by nephrology. Patient renal function has improved to baseline.  Potassium went up to 5.6 on 11/4, potassium normalized after giving 10 g of Lokelma. Renal ultrasound performed, showed moderate to right-sided hydronephrosis which appears to be present in 2018. Also confirmed that patient does have both kidneys which has been confirmed in ultrasound yesterday and ultrasound in  2018. Acidosis resolved, will d/c bicarb Kidney function appears to have stabilized  Dementia Appears stable  Chronic diarrhea. Probably from malnutrition, started on Creon, diarrhea seem to be stopped.   Scrotal  cellulitis. resolved Bactrim discontinued, doxycycline complete   Chronic atrial fibrillation. Rate under control, continue Eliquis.   Severe debility.  Frequent falls. Patient has been seen by PT/OT, recommending nursing home placement, insurance auth pending   Type 2 diabetes  Hyperglycemic today Hold off oral diabetic medications, continue sliding scale insulin. Will add basal/bolus   Essential hypertension.    hold off ARB.  Amlodipine added, pressures improving      Subjective:  Patient doing well, no complaints, no diarrhea  Physical Exam: Vitals:   01/11/23 1539 01/11/23 1946 01/12/23 0439 01/12/23 0752  BP: (!) 143/58 (!) 155/61 (!) 141/70 (!) 154/70  Pulse: 79 75 66 68  Resp: 18 18 18 19   Temp: (!) 97.5 F (36.4 C) 98.3 F (36.8 C) 98.1 F (36.7 C) 97.7 F (36.5 C)  TempSrc: Oral  Oral   SpO2: 97% 97% 96% 94%  Weight:      Height:       General exam: Appears calm and comfortable  Respiratory system: Clear to auscultation.   Cardiovascular system: rr no murmur Gastrointestinal system: Abdomen is nondistended, soft and nontender.   Central nervous system: Alert, moving all 4 Extremities: Symmetric 5 x 5 power. Skin: No rashes, lesions or ulcers. Scrotum appears normal Psychiatry: calm but a bit confused   Data Reviewed:  Lab results reviewed.  Family Communication: Son updated telephonically 11/6  Disposition: medically stable for discharge to snf pending insurance auth Status is: Observation      Time spent: 35 minutes  Author: Silvano Bilis, MD 01/12/2023 2:43 PM  For on call review [email or web address].

## 2023-01-12 NOTE — Progress Notes (Signed)
Physical Therapy Treatment Patient Details Name: Cody Howard MRN: [id number] DOB: Jul 17, 1934 Today's Date: 01/12/2023   History of Present Illness XAIDYN KEPNER is a 88 y.o. male with medical history significant of CKD stage IV with solitary kidney, PAF on Eliquis, HTN, HLD, IIDM, brought in by family member for evaluation of worsening of generalized weakness frequent falls.    PT Comments  Co-tx with OT for therapist and pt safety due to high fall risk and impaired cognition. Pt received in bed, participated in AROM B LE exercises prior to sitting EOB with MinA and use of rail. Several sit<>stand transfers varying between Min/ModA with heavy cues on hand placement and technique. Pt completed gait training in room with RW and MinA of 2 primarily for safety due to pt's high fall risk and impulsivity when transferring to chair. Overall, pt performed well and was very cooperative and pleasant throughout session. Will continue to progress acutely. D/C recs for STR remain appropriate.    If plan is discharge home, recommend the following: A lot of help with walking and/or transfers;A lot of help with bathing/dressing/bathroom;Assistance with cooking/housework;Direct supervision/assist for medications management;Assist for transportation;Help with stairs or ramp for entrance   Can travel by private vehicle     No  Equipment Recommendations  Rolling walker (2 wheels)    Recommendations for Other Services       Precautions / Restrictions Precautions Precautions: Fall Restrictions Weight Bearing Restrictions: No     Mobility  Bed Mobility Overal bed mobility: Needs Assistance Bed Mobility: Supine to Sit     Supine to sit: Min assist, Used rails, HOB elevated Sit to supine: HOB elevated   General bed mobility comments: uses Hand rail heavily.    Transfers Overall transfer level: Needs assistance Equipment used: Rolling walker (2 wheels) Transfers: Sit to/from Stand Sit to  Stand: Mod assist           General transfer comment: MOD A + RW standing from bed and BSC, improves to MIN A standing from chair with cues. Pt tends to "dive" for the chair    Ambulation/Gait Ambulation/Gait assistance: Mod assist, Min assist, +2 physical assistance Gait Distance (Feet): 20 Feet Assistive device: Rolling walker (2 wheels) Gait Pattern/deviations: Step-to pattern, Decreased step length - right, Decreased step length - left, Knees buckling, Trunk flexed       General Gait Details: High fall risk, knees remained flexed during gait training   Stairs             Wheelchair Mobility     Tilt Bed    Modified Rankin (Stroke Patients Only)       Balance Overall balance assessment: Needs assistance Sitting-balance support: No upper extremity supported, Feet supported Sitting balance-Leahy Scale: Fair     Standing balance support: During functional activity, Bilateral upper extremity supported, Reliant on assistive device for balance Standing balance-Leahy Scale: Poor                              Cognition Arousal: Alert Behavior During Therapy: WFL for tasks assessed/performed Overall Cognitive Status: Impaired/Different from baseline Area of Impairment: Orientation, Memory, Following commands                 Orientation Level: Disoriented to, Place, Time, Situation   Memory: Decreased recall of precautions, Decreased short-term memory Following Commands: Follows one step commands consistently, Follows one step commands with increased time  General Comments: Oriented to self        Exercises General Exercises - Lower Extremity Ankle Circles/Pumps: AROM, Both, 15 reps, Supine Heel Slides: AROM, Both, 10 reps, Supine Hip ABduction/ADduction: AROM, Both, 10 reps, Supine Straight Leg Raises: AAROM, Both, 5 reps, Supine    General Comments General comments (skin integrity, edema, etc.):  (c/o back soreness in specific  area where bruising from possible fall prior to admission)      Pertinent Vitals/Pain Pain Assessment Pain Assessment: Faces Faces Pain Scale: Hurts even more Pain Location: mid back around bruising Pain Descriptors / Indicators: Grimacing, Discomfort Pain Intervention(s): Limited activity within patient's tolerance    Home Living                          Prior Function            PT Goals (current goals can now be found in the care plan section) Acute Rehab PT Goals Patient Stated Goal: " Need to get strong so that I can go home." Progress towards PT goals: Progressing toward goals    Frequency    Min 1X/week      PT Plan      Co-evaluation PT/OT/SLP Co-Evaluation/Treatment: Yes Reason for Co-Treatment: Necessary to address cognition/behavior during functional activity;For patient/therapist safety PT goals addressed during session: Mobility/safety with mobility OT goals addressed during session: ADL's and self-care      AM-PAC PT "6 Clicks" Mobility   Outcome Measure  Help needed turning from your back to your side while in a flat bed without using bedrails?: A Little Help needed moving from lying on your back to sitting on the side of a flat bed without using bedrails?: A Lot Help needed moving to and from a bed to a chair (including a wheelchair)?: A Lot Help needed standing up from a chair using your arms (e.g., wheelchair or bedside chair)?: A Lot Help needed to walk in hospital room?: A Lot Help needed climbing 3-5 steps with a railing? : Total 6 Click Score: 12    End of Session Equipment Utilized During Treatment: Gait belt Activity Tolerance: Patient tolerated treatment well Patient left: in chair;with call bell/phone within reach;with chair alarm set Nurse Communication: Mobility status PT Visit Diagnosis: Unsteadiness on feet (R26.81);Repeated falls (R29.6);History of falling (Z91.81);Muscle weakness (generalized) (M62.81);Other  abnormalities of gait and mobility (R26.89);Difficulty in walking, not elsewhere classified (R26.2)     Time: 0865-7846 PT Time Calculation (min) (ACUTE ONLY): 26 min  Charges:    $Therapeutic Activity: 8-22 mins PT General Charges $$ ACUTE PT VISIT: 1 Visit                    Zadie Cleverly, PTA  Jannet Askew 01/12/2023, 3:50 PM

## 2023-01-12 NOTE — Progress Notes (Signed)
Occupational Therapy Treatment Patient Details Name: Cody Howard MRN: [id number] DOB: [birth year] Today's Date: 01/12/2023   History of present illness Cody Howard is a 88 y.o. male with medical history significant of CKD stage IV with solitary kidney, PAF on Eliquis, HTN, HLD, IIDM, brought in by family member for evaluation of worsening of generalized weakness frequent falls.   OT comments  Cody Howard was seen for OT/PT treatment on this date. Upon arrival to room pt reclined in bed, agreeable to tx. Oriented to self only and hard of hearing. Pt requires MOD A + RW for BSC t/f ~10 ft, MOD A x2 pericare in standing. SETUP + SUPERVISION seated bathing and don/doff gown + B socks in sitting. Tolerated x4 sit<>stands at chair with MIN A. Pt making good progress toward goals, will continue to follow POC. Discharge recommendation remains appropriate.      If plan is discharge home, recommend the following:  Supervision due to cognitive status;Two people to help with walking and/or transfers;Direct supervision/assist for medications management;Help with stairs or ramp for entrance;A lot of help with bathing/dressing/bathroom   Equipment Recommendations  None recommended by OT    Recommendations for Other Services      Precautions / Restrictions Precautions Precautions: Fall Restrictions Weight Bearing Restrictions: No       Mobility Bed Mobility Overal bed mobility: Needs Assistance Bed Mobility: Supine to Sit     Supine to sit: Min assist, Used rails, HOB elevated          Transfers Overall transfer level: Needs assistance Equipment used: Rolling walker (2 wheels) Transfers: Sit to/from Stand Sit to Stand: Mod assist           General transfer comment: MOD A + RW standing from bed and BSC, improves to MIN A standing from chair with cues     Balance Overall balance assessment: Needs assistance Sitting-balance support: No upper extremity supported, Feet  supported Sitting balance-Leahy Scale: Fair     Standing balance support: Single extremity supported, During functional activity Standing balance-Leahy Scale: Poor                             ADL either performed or assessed with clinical judgement   ADL Overall ADL's : Needs assistance/impaired                                       General ADL Comments: MOD A + RW for toilet t/f, MOD A x2 pericare in standing. SETUP + SUPERVISION don/doff B socks in sitting      Cognition Arousal: Alert Behavior During Therapy: WFL for tasks assessed/performed Overall Cognitive Status: Impaired/Different from baseline Area of Impairment: Orientation, Memory, Following commands                 Orientation Level: Disoriented to, Place, Time, Situation   Memory: Decreased recall of precautions, Decreased short-term memory Following Commands: Follows one step commands consistently, Follows one step commands with increased time                           Pertinent Vitals/ Pain       Pain Assessment Pain Assessment: Faces Faces Pain Scale: Hurts even more Pain Location: mid back around bruising Pain Descriptors / Indicators: Grimacing, Discomfort Pain Intervention(s): Limited activity within patient's tolerance,  Repositioned   Frequency  Min 1X/week        Progress Toward Goals  OT Goals(current goals can now be found in the care plan section)  Progress towards OT goals: Progressing toward goals  Acute Rehab OT Goals OT Goal Formulation: With patient Time For Goal Achievement: 01/23/23 Potential to Achieve Goals: Good ADL Goals Pt Will Perform Grooming: with set-up;with supervision;sitting Pt Will Perform Upper Body Dressing: with set-up;sitting Pt Will Perform Lower Body Dressing: sit to/from stand;sitting/lateral leans;with min assist Pt Will Transfer to Toilet: with min assist;bedside commode;grab bars Pt Will Perform Toileting -  Clothing Manipulation and hygiene: with min assist;sitting/lateral leans;sit to/from stand  Plan      Co-evaluation    PT/OT/SLP Co-Evaluation/Treatment: Yes Reason for Co-Treatment: Necessary to address cognition/behavior during functional activity;For patient/therapist safety PT goals addressed during session: Mobility/safety with mobility OT goals addressed during session: ADL's and self-care      AM-PAC OT "6 Clicks" Daily Activity     Outcome Measure   Help from another person eating meals?: None Help from another person taking care of personal grooming?: A Little Help from another person toileting, which includes using toliet, bedpan, or urinal?: A Lot Help from another person bathing (including washing, rinsing, drying)?: A Lot Help from another person to put on and taking off regular upper body clothing?: A Little Help from another person to put on and taking off regular lower body clothing?: A Little 6 Click Score: 17    End of Session Equipment Utilized During Treatment: Gait belt;Rolling walker (2 wheels)  OT Visit Diagnosis: Muscle weakness (generalized) (M62.81);History of falling (Z91.81);Repeated falls (R29.6);Unsteadiness on feet (R26.81)   Activity Tolerance Patient tolerated treatment well   Patient Left in chair;with call bell/phone within reach;with chair alarm set (MD in room)   Nurse Communication          Time: [phone] OT Time Calculation (min): 24 min  Charges: OT General Charges $OT Visit: 1 Visit OT Treatments $Self Care/Home Management : 8-22 mins  Kathie Dike, M.S. OTR/L  01/12/23, 3:04 PM  ascom [phone]

## 2023-01-13 DIAGNOSIS — N179 Acute kidney failure, unspecified: Secondary | ICD-10-CM | POA: Diagnosis not present

## 2023-01-13 DIAGNOSIS — N184 Chronic kidney disease, stage 4 (severe): Secondary | ICD-10-CM | POA: Diagnosis not present

## 2023-01-13 LAB — BASIC METABOLIC PANEL
Anion gap: 9 (ref 5–15)
BUN: 58 mg/dL — ABNORMAL HIGH (ref 8–23)
CO2: 22 mmol/L (ref 22–32)
Calcium: 8.7 mg/dL — ABNORMAL LOW (ref 8.9–10.3)
Chloride: 106 mmol/L (ref 98–111)
Creatinine, Ser: 2.75 mg/dL — ABNORMAL HIGH (ref 0.61–1.24)
GFR, Estimated: 22 mL/min — ABNORMAL LOW (ref >60)
Glucose, Bld: 278 mg/dL — ABNORMAL HIGH (ref 70–99)
Potassium: 5 mmol/L (ref 3.5–5.1)
Sodium: 137 mmol/L (ref 135–145)

## 2023-01-13 LAB — GLUCOSE, CAPILLARY
Glucose-Capillary: 252 mg/dL — ABNORMAL HIGH (ref 70–99)
Glucose-Capillary: 299 mg/dL — ABNORMAL HIGH (ref 70–99)
Glucose-Capillary: 236 mg/dL — ABNORMAL HIGH (ref 70–99)

## 2023-01-13 MED ORDER — POLYETHYLENE GLYCOL 3350 17 G PO PACK
17.0000 g | PACK | Freq: Every day | ORAL | Status: DC
  Administered 2023-01-13 – 2023-01-14 (×2): 17 g via ORAL
  Filled 2023-01-13 (×2): qty 1

## 2023-01-13 MED ORDER — INSULIN GLARGINE-YFGN 100 UNIT/ML ~~LOC~~ SOLN
10.0000 [IU] | Freq: Every day | SUBCUTANEOUS | Status: DC
  Administered 2023-01-13: 10 [IU] via SUBCUTANEOUS
  Filled 2023-01-13 (×2): qty 0.1

## 2023-01-13 NOTE — Progress Notes (Signed)
Progress Note   Patient: Cody Howard CVE:[id number] DOB: 1934/12/07 DOA: 01/08/2023     0 DOS: the patient was seen and examined on 01/13/2023   Brief hospital course: Cody Howard is a 88 y.o. male with medical history significant of CKD stage IV, PAF on Eliquis, HTN, HLD, IIDM, brought in by family member for evaluation of worsening of generalized weakness frequent falls.  Patient was found to have acute on chronic renal failure.  Patient recently was placed on Bactrim for treating cellulitis. He is seen by nephrology, started IV fluids for acute renal failure.  Patient renal function has back to baseline, seen by PT/OT, recommend nursing home placement.  Currently medically stable for discharge pending insurance approval.   Principal Problem:   Acute renal failure superimposed on stage 4 chronic kidney disease (HCC) Active Problems:   Type II diabetes mellitus with renal manifestations (HCC)   Atrial fibrillation, chronic (HCC)   Metabolic acidosis   Hyponatremia   Hyperkalemia   Assessment and Plan: Acute kidney injury on chronic kidney disease stage IV. Metabolic acidosis  Mild hyponatremia. Hyperkalemia. Patient had worsening renal function after taking Bactrim, but he also had a diarrhea.  Condition many caused by dehydration. He has been treated with IV fluids, seen by nephrology. Patient renal function has improved to baseline.  Potassium went up to 5.6 on 11/4, potassium normalized after giving 10 g of Lokelma. Renal ultrasound performed, showed moderate to right-sided hydronephrosis which appears to be present in 2018. Also confirmed that patient does have both kidneys which has been confirmed in ultrasound yesterday and ultrasound in  2018. Acidosis resolved, will d/c bicarb Kidney function appears to have stabilized, bmp for today is pending  Dementia Stable, no behavioral disturbance  Chronic diarrhea. started on Creon, diarrhea seem to be stopped.    Scrotal cellulitis. resolved Bactrim discontinued, doxycycline complete   Chronic atrial fibrillation. Rate under control, continue Eliquis.   Severe debility.  Frequent falls. Patient has been seen by PT/OT, recommending nursing home placement, insurance auth pending   Type 2 diabetes  Hyperglycemic today Hold off oral diabetic medications, continue sliding scale insulin. Have added basal/bolus, will increase basal today from 5 to 10   Essential hypertension.    hold off ARB.  Amlodipine added, pressures much improved      Subjective:  Patient doing well, no complaints, no diarrhea, no stool for a couple of days actually  Physical Exam: Vitals:   01/12/23 1652 01/12/23 2110 01/13/23 0454 01/13/23 0812  BP: (!) 142/78 (!) 144/69 (!) 166/69 134/76  Pulse: 74 69 74 71  Resp: 17 18 19 18   Temp: 97.7 F (36.5 C) 98 F (36.7 C) 98.4 F (36.9 C) 98 F (36.7 C)  TempSrc: Oral   Oral  SpO2: 97% 97% 96% 100%  Weight:      Height:       General exam: Appears calm and comfortable  Respiratory system: Clear to auscultation.   Cardiovascular system: rr no murmur Gastrointestinal system: Abdomen is nondistended, soft and nontender.   Central nervous system: Alert, moving all 4 Extremities: Symmetric 5 x 5 power. Skin: No rashes, lesions or ulcers. Scrotum appears normal Psychiatry: calm but a bit confused   Data Reviewed:  Lab results reviewed.  Family Communication: Son updated telephonically 11/7  Disposition: medically stable for discharge to snf pending insurance auth Status is: Observation      Time spent: 35 minutes  Author: Silvano Bilis, MD 01/13/2023 11:44 AM  For on call review [email or web address].

## 2023-01-13 NOTE — Plan of Care (Signed)
  Problem: Education: Goal: Ability to describe self-care measures that may prevent or decrease complications (Diabetes Survival Skills Education) will improve Outcome: Progressing Goal: Individualized Educational Video(s) Outcome: Progressing   Problem: Coping: Goal: Ability to adjust to condition or change in health will improve Outcome: Progressing   Problem: Fluid Volume: Goal: Ability to maintain a balanced intake and output will improve Outcome: Progressing   Problem: Health Behavior/Discharge Planning: Goal: Ability to identify and utilize available resources and services will improve Outcome: Progressing Goal: Ability to manage health-related needs will improve Outcome: Progressing   Problem: Metabolic: Goal: Ability to maintain appropriate glucose levels will improve Outcome: Progressing   Problem: Nutritional: Goal: Maintenance of adequate nutrition will improve Outcome: Progressing Goal: Progress toward achieving an optimal weight will improve Outcome: Progressing   Problem: Skin Integrity: Goal: Risk for impaired skin integrity will decrease Outcome: Progressing   Problem: Tissue Perfusion: Goal: Adequacy of tissue perfusion will improve Outcome: Progressing   Problem: Education: Goal: Knowledge of General Education information will improve Description: Including pain rating scale, medication(s)/side effects and non-pharmacologic comfort measures Outcome: Progressing   Problem: Health Behavior/Discharge Planning: Goal: Ability to manage health-related needs will improve Outcome: Progressing   Problem: Clinical Measurements: Goal: Ability to maintain clinical measurements within normal limits will improve Outcome: Progressing Goal: Will remain free from infection Outcome: Progressing Goal: Diagnostic test results will improve Outcome: Progressing Goal: Respiratory complications will improve Outcome: Progressing Goal: Cardiovascular complication will  be avoided Outcome: Progressing   Problem: Activity: Goal: Risk for activity intolerance will decrease Outcome: Progressing   Problem: Nutrition: Goal: Adequate nutrition will be maintained Outcome: Progressing   

## 2023-01-13 NOTE — TOC Progression Note (Signed)
Transition of Care [facility]) - Progression Note    Patient Details  Name: Cody Howard MRN: [id number] Date of Birth: 07/16/1934  [facility] CM/SW Contact  Allena Katz, LCSW Phone Number: 01/13/2023, 9:39 AM  Clinical Narrative:   [facility] still pending for [facility]          Expected Discharge Plan and Services                                               Social Determinants of Health (SDOH) Interventions SDOH Screenings   Food Insecurity: No Food Insecurity (01/10/2023)  Housing: Low Risk  (01/10/2023)  Transportation Needs: No Transportation Needs (01/10/2023)  Utilities: Not At Risk (01/10/2023)  Alcohol Screen: Low Risk  (07/07/2022)  Depression (PHQ2-9): Low Risk  (12/31/2022)  Financial Resource Strain: Low Risk  (07/07/2022)  Physical Activity: Insufficiently Active (07/07/2022)  Social Connections: Moderately Isolated (07/07/2022)  Stress: No Stress Concern Present (07/07/2022)  Tobacco Use: Medium Risk (01/10/2023)    Readmission Risk Interventions     No data to display

## 2023-01-13 NOTE — Progress Notes (Signed)
Physical Therapy Treatment Patient Details Name: Cody Howard MRN: [id number] DOB: 1934-04-01 Today's Date: 01/13/2023   History of Present Illness Cody Howard is a 88 y.o. male with medical history significant of CKD stage IV with solitary kidney, PAF on Eliquis, HTN, HLD, IIDM, brought in by family member for evaluation of worsening of generalized weakness frequent falls.    PT Comments  Pt demonstrated improved ability to complete sit<>stand transfers with Min/CGA. Increased gait distance with RW ~24ft with close CGA due to high risk for buckling when fatigued- pt ambulates with B knees flexed despite vc's for upright posture and ability to attain full knee extension in sitting/supine. Overall, great progress functionally. Pt is very pleasant and cooperative despite underlying dementia. Will continue per POC until pt transitions to LTC facility.   If plan is discharge home, recommend the following: A lot of help with walking and/or transfers;A lot of help with bathing/dressing/bathroom;Assistance with cooking/housework;Direct supervision/assist for medications management;Assist for transportation;Help with stairs or ramp for entrance   Can travel by private vehicle     No  Equipment Recommendations  Rolling walker (2 wheels)    Recommendations for Other Services       Precautions / Restrictions Precautions Precautions: Fall Restrictions Weight Bearing Restrictions: No     Mobility  Bed Mobility               General bed mobility comments: Pt in chair pre/post session    Transfers Overall transfer level: Needs assistance Equipment used: Rolling walker (2 wheels) Transfers: Sit to/from Stand Sit to Stand: Min assist           General transfer comment: Improved ability to transfer sit<>stand    Ambulation/Gait Ambulation/Gait assistance: Min assist, Contact guard assist Gait Distance (Feet): 65 Feet Assistive device: Rolling walker (2 wheels) Gait  Pattern/deviations: Step-to pattern, Decreased step length - right, Decreased step length - left, Trunk flexed Gait velocity: decreased     General Gait Details: 65'x1, 40'x1, High fall risk, knees remained flexed during gait training   Stairs             Wheelchair Mobility     Tilt Bed    Modified Rankin (Stroke Patients Only)       Balance Overall balance assessment: Needs assistance Sitting-balance support: No upper extremity supported, Feet supported Sitting balance-Leahy Scale: Fair     Standing balance support: During functional activity, Bilateral upper extremity supported, Reliant on assistive device for balance Standing balance-Leahy Scale: Fair Standing balance comment: No LOB during gait, however pt unable to straighten up at knees resulting in high risk for buckling.                            Cognition Arousal: Alert Behavior During Therapy: WFL for tasks assessed/performed Overall Cognitive Status: Impaired/Different from baseline Area of Impairment: Orientation, Memory, Following commands                 Orientation Level: Disoriented to, Place, Time, Situation   Memory: Decreased recall of precautions, Decreased short-term memory Following Commands: Follows one step commands consistently, Follows one step commands with increased time       General Comments: Oriented to self        Exercises General Exercises - Lower Extremity Ankle Circles/Pumps: AROM, Both, 15 reps, Supine Long Arc Quad: AROM, Both, 10 reps, Seated Hip Flexion/Marching: AROM, Both, 10 reps, Seated    General Comments General  comments (skin integrity, edema, etc.): Pt very pleasant and cooperative.      Pertinent Vitals/Pain Pain Assessment Pain Assessment: No/denies pain    Home Living                          Prior Function            PT Goals (current goals can now be found in the care plan section) Acute Rehab PT Goals Patient  Stated Goal: " Need to get strong so that I can go home." Progress towards PT goals: Progressing toward goals    Frequency    Min 1X/week      PT Plan      Co-evaluation              AM-PAC PT "6 Clicks" Mobility   Outcome Measure  Help needed turning from your back to your side while in a flat bed without using bedrails?: A Little Help needed moving from lying on your back to sitting on the side of a flat bed without using bedrails?: A Little Help needed moving to and from a bed to a chair (including a wheelchair)?: A Little Help needed standing up from a chair using your arms (e.g., wheelchair or bedside chair)?: A Little Help needed to walk in hospital room?: A Lot Help needed climbing 3-5 steps with a railing? : Total 6 Click Score: 15    End of Session Equipment Utilized During Treatment: Gait belt Activity Tolerance: Patient tolerated treatment well Patient left: in chair;with call bell/phone within reach;with chair alarm set Nurse Communication: Mobility status PT Visit Diagnosis: Unsteadiness on feet (R26.81);Repeated falls (R29.6);History of falling (Z91.81);Muscle weakness (generalized) (M62.81);Other abnormalities of gait and mobility (R26.89);Difficulty in walking, not elsewhere classified (R26.2)     Time: 0865-7846 PT Time Calculation (min) (ACUTE ONLY): 33 min  Charges:    $Gait Training: 8-22 mins $Therapeutic Exercise: 8-22 mins PT General Charges $$ ACUTE PT VISIT: 1 Visit                    Zadie Cleverly, PTA  Jannet Askew 01/13/2023, 4:02 PM

## 2023-01-14 DIAGNOSIS — Z743 Need for continuous supervision: Secondary | ICD-10-CM | POA: Diagnosis not present

## 2023-01-14 DIAGNOSIS — R69 Illness, unspecified: Secondary | ICD-10-CM | POA: Diagnosis not present

## 2023-01-14 DIAGNOSIS — Z7401 Bed confinement status: Secondary | ICD-10-CM | POA: Diagnosis not present

## 2023-01-14 DIAGNOSIS — N184 Chronic kidney disease, stage 4 (severe): Secondary | ICD-10-CM | POA: Diagnosis not present

## 2023-01-14 DIAGNOSIS — N179 Acute kidney failure, unspecified: Secondary | ICD-10-CM | POA: Diagnosis not present

## 2023-01-14 LAB — GLUCOSE, CAPILLARY
Glucose-Capillary: 159 mg/dL — ABNORMAL HIGH (ref 70–99)
Glucose-Capillary: 192 mg/dL — ABNORMAL HIGH (ref 70–99)
Glucose-Capillary: 192 mg/dL — ABNORMAL HIGH (ref 70–99)

## 2023-01-14 LAB — BASIC METABOLIC PANEL WITH GFR
Anion gap: 7 (ref 5–15)
BUN: 65 mg/dL — ABNORMAL HIGH (ref 8–23)
CO2: 23 mmol/L (ref 22–32)
Calcium: 8.1 mg/dL — ABNORMAL LOW (ref 8.9–10.3)
Chloride: 106 mmol/L (ref 98–111)
Creatinine, Ser: 2.69 mg/dL — ABNORMAL HIGH (ref 0.61–1.24)
GFR, Estimated: 22 mL/min — ABNORMAL LOW
Glucose, Bld: 152 mg/dL — ABNORMAL HIGH (ref 70–99)
Potassium: 4.7 mmol/L (ref 3.5–5.1)
Sodium: 136 mmol/L (ref 135–145)

## 2023-01-14 MED ORDER — GLYCERIN (LAXATIVE) 2 G RE SUPP
1.0000 | Freq: Once | RECTAL | Status: AC
  Administered 2023-01-14: 1 via RECTAL
  Filled 2023-01-14: qty 1

## 2023-01-14 MED ORDER — LACTULOSE 10 GM/15ML PO SOLN: 30.0000 g | Freq: Once | ORAL | Status: DC

## 2023-01-14 MED ORDER — AMLODIPINE BESYLATE 5 MG PO TABS: 5.0000 mg | ORAL_TABLET | Freq: Every day | ORAL | Status: DC

## 2023-01-14 NOTE — TOC Transition Note (Signed)
Transition of Care [facility]) - CM/SW Discharge Note   Patient Details  Name: Cody Howard MRN: [id number] Date of Birth: 1934-06-13  [facility] of Care [facility]) CM/SW Contact:  Allena Katz, LCSW Phone Number: 01/14/2023, 10:00 AM   Clinical Narrative:   Pt has orders to discharge home. Medical neccesity placed on chart. DC summary to be sent once in. Tiffany with [facility] notified. Son Eldora notified.    Final next level of care: Skilled Nursing Facility Barriers to Discharge: Barriers Resolved   Patient Goals and CMS Choice CMS Medicare.gov Compare Post Acute Care list provided to:: Patient    Discharge Placement                Patient chooses bed at: [facility] Patient to be transferred to facility by: [facility]   Patient and family notified of of transfer: 01/14/23  Discharge Plan and Services Additional resources added to the After Visit Summary for                                       Social Determinants of Health (SDOH) Interventions SDOH Screenings   Food Insecurity: No Food Insecurity (01/10/2023)  Housing: Low Risk  (01/10/2023)  Transportation Needs: No Transportation Needs (01/10/2023)  Utilities: Not At Risk (01/10/2023)  Alcohol Screen: Low Risk  (07/07/2022)  Depression (PHQ2-9): Low Risk  (12/31/2022)  Financial Resource Strain: Low Risk  (07/07/2022)  Physical Activity: Insufficiently Active (07/07/2022)  Social Connections: Moderately Isolated (07/07/2022)  Stress: No Stress Concern Present (07/07/2022)  Tobacco Use: Medium Risk (01/10/2023)     Readmission Risk Interventions     No data to display

## 2023-01-14 NOTE — Discharge Summary (Signed)
Cody Howard:[id number] DOB: 1934/08/04 DOA: 01/08/2023  PCP: Duanne Limerick, MD  Admit date: 01/08/2023 Discharge date: 01/14/2023  Time spent: 35 minutes  Recommendations for Outpatient Follow-up:  F/u nephrology dr. Cherylann Ratel (need to call and schedule) Check bmp one week Consider re-starting ARB as outpt    Discharge Diagnoses:  Principal Problem:   Acute renal failure superimposed on stage 4 chronic kidney disease (HCC) Active Problems:   Type II diabetes mellitus with renal manifestations (HCC)   Atrial fibrillation, chronic (HCC)   Metabolic acidosis   Hyponatremia   Hyperkalemia   Discharge Condition: stable  Diet recommendation: low sodium  Filed Weights   01/09/23 2028 01/10/23 0519 01/10/23 0538  Weight: 86.2 kg 82.4 kg 82.4 kg    History of present illness:  Cody Howard is a 88 y.o. male with medical history significant of CKD stage IV with solitary kidney, PAF on Eliquis, HTN, HLD, IIDM, brought in by family member for evaluation of worsening of generalized weakness frequent falls.   Symptoms started 5 to 6 days ago.  Last week patient developed a rash in his scrotum with some pain and itchiness and went to see PCP was diagnosed with cellulitis and started on Bactrim twice daily and a topical nystatin cream.  From earlier this week patient developed unsteady gait generalized weakness frequent falls he denied any numbness weakness of any 1 side of his body and saying that he just felt weak on his feet and legs.  No dysuria no diarrhea.  Last 2 days oral intake has all been affected been eating drinking less than usual as per wife.  No fever or chills. He been taking 7 days course of Bactrim so far.  Hospital Course:  Acute kidney injury on chronic kidney disease stage IV. Metabolic acidosis  Mild hyponatremia. Hyperkalemia. Patient had worsening renal function after taking Bactrim, but he also had a diarrhea.  Condition many caused by dehydration. He  has been treated with IV fluids, seen by nephrology. Patient renal function has improved to baseline.  Renal ultrasound performed, showed moderate to right-sided hydronephrosis which appears to be present in 2018. Also confirmed that patient does have both kidneys which has been confirmed in ultrasound yesterday and ultrasound in  2018. Acidosis resolved, stable off bicarb F/u dr. Cherylann Ratel,    Dementia Stable, no behavioral disturbance   Chronic diarrhea. started on Creon, diarrhea has resolved   Scrotal cellulitis. resolved Bactrim discontinued, doxycycline completed   Chronic atrial fibrillation. Rate under control, continue Eliquis.   Severe debility.  Frequent falls. Patient has been seen by PT/OT, recommending nursing home placement    Type 2 diabetes    Essential hypertension.    hold off ARB.  Amlodipine added, pressures much improved    Procedures: none   Consultations: nephrology  Discharge Exam: Vitals:   01/14/23 0447 01/14/23 0839  BP: (!) 158/69 (!) 145/67  Pulse: 69 71  Resp: (!) 24 19  Temp: 98 F (36.7 C) (!) 97.4 F (36.3 C)  SpO2: 96% 96%    General exam: Appears calm and comfortable  Respiratory system: Clear to auscultation.   Cardiovascular system: rr no murmur Gastrointestinal system: Abdomen is nondistended, soft and nontender.   Central nervous system: Alert, moving all 4 Extremities: Symmetric 5 x 5 power. Skin: No rashes, lesions or ulcers. Scrotum appears normal Psychiatry: calm but a bit confused  Discharge Instructions   Discharge Instructions     Diet - low sodium heart healthy  Complete by: As directed    Increase activity slowly   Complete by: As directed       Allergies as of 01/14/2023   No Known Allergies      Medication List     STOP taking these medications    losartan 50 MG tablet Commonly known as: COZAAR   sulfamethoxazole-trimethoprim 800-160 MG tablet Commonly known as: BACTRIM DS       TAKE  these medications    Align 4 MG Caps Take 1 capsule (4 mg total) by mouth at bedtime.   amLODipine 5 MG tablet Commonly known as: NORVASC Take 1 tablet (5 mg total) by mouth daily. Start taking on: January 15, 2023   apixaban 2.5 MG Tabs tablet Commonly known as: ELIQUIS Take 2.5 mg by mouth 2 (two) times daily.   chlorhexidine 4 % external liquid Commonly known as: HIBICLENS Apply topically daily as needed.   Cholecalciferol 125 MCG (5000 UT) Tabs Take 1 tablet (5,000 Units total) by mouth daily.   CITRACAL + D PO Take 1 tablet by mouth daily.   ferrous sulfate 325 (65 FE) MG tablet Commonly known as: FeroSul Take 1 tablet (325 mg total) by mouth daily with breakfast.   glipiZIDE 5 MG tablet Commonly known as: GLUCOTROL TAKE (1) TABLET BY MOUTH TWICE DAILY BEFORE MEALS.   memantine 5 MG tablet Commonly known as: NAMENDA Take 5 mg by mouth 2 (two) times daily.   montelukast 10 MG tablet Commonly known as: SINGULAIR Take 1 tablet (10 mg total) by mouth at bedtime.   mupirocin ointment 2 % Commonly known as: BACTROBAN Apply 1 Application topically 2 (two) times daily.   nystatin cream Commonly known as: MYCOSTATIN Apply 1 Application topically 2 (two) times daily.   sertraline 25 MG tablet Commonly known as: ZOLOFT Take 1 tablet (25 mg total) by mouth daily.   simvastatin 80 MG tablet Commonly known as: ZOCOR Take 1 tablet (80 mg total) by mouth every evening.       No Known Allergies  Contact information for after-discharge care     Destination     [facility] Preferred SNF .   Service: Skilled Nursing Contact information: [address] [place] [address] [phone]                      The results of significant diagnostics from this hospitalization (including imaging, microbiology, ancillary and laboratory) are listed below for reference.     Significant Diagnostic Studies: US RENAL  Result Date: 01/09/2023 CLINICAL DATA:  [id number] AKI (acute kidney injury) (HCC) [id number] EXAM: RENAL / URINARY TRACT ULTRASOUND COMPLETE COMPARISON:  12/19/2018, 07/30/2016 FINDINGS: Right Kidney: Renal measurements: 12.0 x 5.4 x 4.8 cm = volume: 165 mL. Mildly increased renal cortical echogenicity. Moderate hydronephrosis. No mass or shadowing stone is visualized. Left Kidney: Renal measurements: 10.9 x 5.0 x 5.3 cm = volume: 152 mL. Mildly increased renal cortical echogenicity. No mass, shadowing stone, or hydronephrosis visualized. Bladder: Appears normal for degree of bladder distention. Other: None. IMPRESSION: 1. Moderate right hydronephrosis. A similar finding was present on the previous ultrasound from 2020. 2. Mildly increased renal cortical echogenicity bilaterally, as can be seen in the setting of medical renal disease. Electronically Signed   By: Duanne Guess D.O.   On: 01/09/2023 16:32   CT HEAD WO CONTRAST  Result Date: 01/08/2023 CLINICAL DATA:  Fall on blood thinners EXAM: CT  HEAD WITHOUT CONTRAST TECHNIQUE: Contiguous axial images were obtained from the base of the skull through the vertex without intravenous contrast. RADIATION DOSE REDUCTION: This exam was performed according to the departmental dose-optimization program which includes automated exposure control, adjustment of the mA and/or kV according to patient size and/or use of iterative reconstruction technique. COMPARISON:  Head CT 01/01/2022 FINDINGS: Brain: There is no acute intracranial hemorrhage, extra-axial fluid collection, or acute infarct. Parenchymal volume loss with prominence of the ventricular system and extra-axial CSF spaces is unchanged. Gray-white differentiation is preserved. Hypodensity in the supratentorial white matter is consistent with underlying chronic small-vessel ischemic change. The pituitary and suprasellar region are normal. There is no mass lesion. There  is no mass effect or midline shift. Vascular: No hyperdense vessel or unexpected calcification. Skull: Normal. Negative for fracture or focal lesion. Sinuses/Orbits: The imaged paranasal sinuses are clear. The imaged globes and orbits are unremarkable. Other: The mastoid air cells and middle ear cavities are clear. IMPRESSION: No acute intracranial pathology. Electronically Signed   By: Lesia Hausen M.D.   On: 01/08/2023 12:04   CT Cervical Spine Wo Contrast  Result Date: 01/08/2023 CLINICAL DATA:  Poly trauma, blunt. Weakness after falling 4 days ago. History of dementia. On blood thinners. EXAM: CT CERVICAL SPINE WITHOUT CONTRAST TECHNIQUE: Multidetector CT imaging of the cervical spine was performed without intravenous contrast. Multiplanar CT image reconstructions were also generated. RADIATION DOSE REDUCTION: This exam was performed according to the departmental dose-optimization program which includes automated exposure control, adjustment of the mA and/or kV according to patient size and/or use of iterative reconstruction technique. COMPARISON:  None Available. FINDINGS: Alignment: Straightening with a mild degenerative anterolisthesis at the C4-5 and C5-6 levels. Skull base and vertebrae: No evidence of acute cervical spine fracture or traumatic subluxation. Multilevel endplate osteophytes with asymmetric left-sided facet hypertrophy at C2-3. Soft tissues and spinal canal: No prevertebral fluid or swelling. No visible canal hematoma. Disc levels: Mild multilevel spondylosis, greatest at C5-6 where there is a broad-based central disc protrusion with covering spur and asymmetric left-sided uncinate spurring. Moderate left and mild right foraminal narrowing. Small central disc protrusion at C4-5. No large disc herniation identified. Upper chest: Mild biapical scarring with calcifications. Other: Bilateral carotid atherosclerosis. IMPRESSION: 1. No evidence of acute cervical spine fracture, traumatic  subluxation or static signs of instability. 2. Mild multilevel spondylosis, greatest at C5-6 where there is moderate left and mild right foraminal narrowing. Electronically Signed   By: Carey Bullocks M.D.   On: 01/08/2023 12:03   DG Foot Complete Left  Result Date: 01/08/2023 CLINICAL DATA:  Pain after fall EXAM: LEFT FOOT - COMPLETE 3 VIEW COMPARISON:  None Available. FINDINGS: No fracture or dislocation. Preserved joint spaces. Osteopenia. Scattered vascular calcifications. Well corticated plantar and Achilles calcaneal spurs. Slight degenerative changes of the first metatarsophalangeal joint. IMPRESSION: Mild degenerative changes. Calcaneal spurs. Vascular calcifications. Electronically Signed   By: Karen Kays M.D.   On: 01/08/2023 12:02    Microbiology: No results found for this or any previous visit (from the past 240 hour(s)).   Labs: Basic Metabolic Panel: Recent Labs  Lab 01/10/23 0306 01/10/23 1300 01/11/23 0527 01/12/23 0402 01/13/23 1134 01/14/23 0314  NA 138 134* 135 137 137 136  K 5.6* 4.7 4.5 4.5 5.0 4.7  CL 112* 107 108 109 106 106  CO2 20* 19* 18* 22 22 23   GLUCOSE 133* 260* 157* 174* 278* 152*  BUN 40* 39* 41* 54* 58* 65*  CREATININE 2.76*  2.62* 2.54* 2.71* 2.75* 2.69*  CALCIUM 8.4* 8.3* 8.3* 8.2* 8.7* 8.1*  MG 1.8  --   --   --   --   --    Liver Function Tests: No results for input(s): "AST", "ALT", "ALKPHOS", "BILITOT", "PROT", "ALBUMIN" in the last 168 hours. No results for input(s): "LIPASE", "AMYLASE" in the last 168 hours. No results for input(s): "AMMONIA" in the last 168 hours. CBC: Recent Labs  Lab 01/08/23 1117 01/09/23 0326  WBC 10.8* 7.2  HGB 11.8* 10.0*  HCT 34.7* 29.4*  MCV 102.4* 96.4  PLT 158 112*   Cardiac Enzymes: No results for input(s): "CKTOTAL", "CKMB", "CKMBINDEX", "TROPONINI" in the last 168 hours. BNP: BNP (last 3 results) No results for input(s): "BNP" in the last 8760 hours.  ProBNP (last 3 results) No results for  input(s): "PROBNP" in the last 8760 hours.  CBG: Recent Labs  Lab 01/12/23 2127 01/13/23 1130 01/13/23 1546 01/13/23 2041 01/14/23 0840  GLUCAP 236* 252* 299* 236* 159*       Signed:  Silvano Bilis MD.  [facility] 01/14/2023, 10:02 AM

## 2023-01-14 NOTE — Progress Notes (Signed)
Pt discharged to [facility] via EMS.All belongings sent with wife. Pts  IVs removed. I called report to [phone]. and spoke to [place], Charity fundraiser.

## 2023-01-21 ENCOUNTER — Ambulatory Visit: Payer: Self-pay | Admitting: *Deleted

## 2023-01-21 DIAGNOSIS — R0981 Nasal congestion: Secondary | ICD-10-CM | POA: Diagnosis not present

## 2023-01-21 DIAGNOSIS — F0390 Unspecified dementia without behavioral disturbance: Secondary | ICD-10-CM | POA: Diagnosis not present

## 2023-01-21 DIAGNOSIS — R197 Diarrhea, unspecified: Secondary | ICD-10-CM | POA: Diagnosis not present

## 2023-01-21 DIAGNOSIS — E875 Hyperkalemia: Secondary | ICD-10-CM | POA: Diagnosis not present

## 2023-01-21 DIAGNOSIS — I482 Chronic atrial fibrillation, unspecified: Secondary | ICD-10-CM | POA: Diagnosis not present

## 2023-01-21 DIAGNOSIS — N179 Acute kidney failure, unspecified: Secondary | ICD-10-CM | POA: Diagnosis not present

## 2023-01-21 DIAGNOSIS — N492 Inflammatory disorders of scrotum: Secondary | ICD-10-CM | POA: Diagnosis not present

## 2023-01-21 DIAGNOSIS — I10 Essential (primary) hypertension: Secondary | ICD-10-CM | POA: Diagnosis not present

## 2023-01-21 NOTE — Patient Outreach (Signed)
  Care Coordination   Follow Up Visit Note   01/21/2023 Name: Cody Howard MRN: [id number] DOB: 10-16-1934  Cody Howard is a 88 y.o. year old male who sees Cody Limerick, MD for primary care. I spoke with Cody Howard, daughter of Cody Howard by phone today.  What matters to the patients health and wellness today?  Patient admitted to hospital 11/2-11/8, discharged to [facility] for short term rehab, has been there a week.  Daughter will work with case management team at [facility] for discharge planning.     Goals Addressed             This Visit's Progress    COMPLETED: No recurrent falls   On track    Care Coordination Interventions: Provided written and verbal education re: potential causes of falls and Fall prevention strategies Advised patient of importance of notifying provider of falls Assessed for falls since last encounter Advised patient to discuss Caregiver resources for patients with dementia with provider Discussed participation with home health PT/OT for balance and strength, PT sessions completed         SDOH assessments and interventions completed:  No     Care Coordination Interventions:  Yes, provided   Interventions Today    Flowsheet Row Most Recent Value  Chronic Disease   Chronic disease during today's visit Atrial Fibrillation (AFib), Chronic Kidney Disease/End Stage Renal Disease (ESRD), Congestive Heart Failure (CHF)  General Interventions   General Interventions Discussed/Reviewed General Interventions Reviewed, Doctor Visits  Doctor Visits Discussed/Reviewed Doctor Visits Reviewed, PCP, Specialist  [Upcoming with nephrology 11/27]  PCP/Specialist Visits Compliance with follow-up visit       Follow up plan:  Pending SNF discharge    Encounter Outcome:  Patient Visit Completed   Rodney Langton, RN, MSN, CCM   [facility], [facility] RN Care Coordinator Direct Dial: [phone] / Main  [phone] Fax [phone] Email: [email or web address]: [email or web address]

## 2023-01-24 DIAGNOSIS — F0390 Unspecified dementia without behavioral disturbance: Secondary | ICD-10-CM | POA: Diagnosis not present

## 2023-01-24 DIAGNOSIS — I482 Chronic atrial fibrillation, unspecified: Secondary | ICD-10-CM | POA: Diagnosis not present

## 2023-01-24 DIAGNOSIS — I129 Hypertensive chronic kidney disease with stage 1 through stage 4 chronic kidney disease, or unspecified chronic kidney disease: Secondary | ICD-10-CM | POA: Diagnosis not present

## 2023-01-24 DIAGNOSIS — N492 Inflammatory disorders of scrotum: Secondary | ICD-10-CM | POA: Diagnosis not present

## 2023-01-24 DIAGNOSIS — N179 Acute kidney failure, unspecified: Secondary | ICD-10-CM | POA: Diagnosis not present

## 2023-01-24 DIAGNOSIS — N184 Chronic kidney disease, stage 4 (severe): Secondary | ICD-10-CM | POA: Diagnosis not present

## 2023-01-25 DIAGNOSIS — E119 Type 2 diabetes mellitus without complications: Secondary | ICD-10-CM | POA: Diagnosis not present

## 2023-01-25 DIAGNOSIS — N179 Acute kidney failure, unspecified: Secondary | ICD-10-CM | POA: Diagnosis not present

## 2023-01-25 DIAGNOSIS — F0390 Unspecified dementia without behavioral disturbance: Secondary | ICD-10-CM | POA: Diagnosis not present

## 2023-01-25 DIAGNOSIS — I482 Chronic atrial fibrillation, unspecified: Secondary | ICD-10-CM | POA: Diagnosis not present

## 2023-01-25 DIAGNOSIS — N184 Chronic kidney disease, stage 4 (severe): Secondary | ICD-10-CM | POA: Diagnosis not present

## 2023-01-25 DIAGNOSIS — E871 Hypo-osmolality and hyponatremia: Secondary | ICD-10-CM | POA: Diagnosis not present

## 2023-01-25 DIAGNOSIS — E785 Hyperlipidemia, unspecified: Secondary | ICD-10-CM | POA: Diagnosis not present

## 2023-01-25 DIAGNOSIS — I10 Essential (primary) hypertension: Secondary | ICD-10-CM | POA: Diagnosis not present

## 2023-01-25 DIAGNOSIS — E875 Hyperkalemia: Secondary | ICD-10-CM | POA: Diagnosis not present

## 2023-01-25 DIAGNOSIS — I129 Hypertensive chronic kidney disease with stage 1 through stage 4 chronic kidney disease, or unspecified chronic kidney disease: Secondary | ICD-10-CM | POA: Diagnosis not present

## 2023-01-25 DIAGNOSIS — N492 Inflammatory disorders of scrotum: Secondary | ICD-10-CM | POA: Diagnosis not present

## 2023-01-26 DIAGNOSIS — N179 Acute kidney failure, unspecified: Secondary | ICD-10-CM | POA: Diagnosis not present

## 2023-01-26 DIAGNOSIS — I482 Chronic atrial fibrillation, unspecified: Secondary | ICD-10-CM | POA: Diagnosis not present

## 2023-01-26 DIAGNOSIS — N184 Chronic kidney disease, stage 4 (severe): Secondary | ICD-10-CM | POA: Diagnosis not present

## 2023-01-26 DIAGNOSIS — R197 Diarrhea, unspecified: Secondary | ICD-10-CM | POA: Diagnosis not present

## 2023-01-26 DIAGNOSIS — I129 Hypertensive chronic kidney disease with stage 1 through stage 4 chronic kidney disease, or unspecified chronic kidney disease: Secondary | ICD-10-CM | POA: Diagnosis not present

## 2023-01-26 DIAGNOSIS — N492 Inflammatory disorders of scrotum: Secondary | ICD-10-CM | POA: Diagnosis not present

## 2023-01-28 DIAGNOSIS — N492 Inflammatory disorders of scrotum: Secondary | ICD-10-CM | POA: Diagnosis not present

## 2023-01-28 DIAGNOSIS — E875 Hyperkalemia: Secondary | ICD-10-CM | POA: Diagnosis not present

## 2023-01-28 DIAGNOSIS — N179 Acute kidney failure, unspecified: Secondary | ICD-10-CM | POA: Diagnosis not present

## 2023-01-28 DIAGNOSIS — I482 Chronic atrial fibrillation, unspecified: Secondary | ICD-10-CM | POA: Diagnosis not present

## 2023-01-28 DIAGNOSIS — N184 Chronic kidney disease, stage 4 (severe): Secondary | ICD-10-CM | POA: Diagnosis not present

## 2023-01-28 DIAGNOSIS — R197 Diarrhea, unspecified: Secondary | ICD-10-CM | POA: Diagnosis not present

## 2023-01-31 DIAGNOSIS — E871 Hypo-osmolality and hyponatremia: Secondary | ICD-10-CM | POA: Diagnosis not present

## 2023-01-31 DIAGNOSIS — I129 Hypertensive chronic kidney disease with stage 1 through stage 4 chronic kidney disease, or unspecified chronic kidney disease: Secondary | ICD-10-CM | POA: Diagnosis not present

## 2023-01-31 DIAGNOSIS — N492 Inflammatory disorders of scrotum: Secondary | ICD-10-CM | POA: Diagnosis not present

## 2023-01-31 DIAGNOSIS — R197 Diarrhea, unspecified: Secondary | ICD-10-CM | POA: Diagnosis not present

## 2023-01-31 DIAGNOSIS — N184 Chronic kidney disease, stage 4 (severe): Secondary | ICD-10-CM | POA: Diagnosis not present

## 2023-01-31 DIAGNOSIS — I482 Chronic atrial fibrillation, unspecified: Secondary | ICD-10-CM | POA: Diagnosis not present

## 2023-02-01 ENCOUNTER — Telehealth: Payer: Self-pay | Admitting: *Deleted

## 2023-02-01 NOTE — Patient Outreach (Signed)
  Care Coordination   Follow Up Visit Note   02/01/2023 Name: Cody Howard MRN: [id number] DOB: Sep 23, 1934  Cody Howard is a 88 y.o. year old male who sees Cody Limerick, MD for primary care. I spoke with Cody Howard, daughter of Cody Howard by phone today.  What matters to the patients health and wellness today? Per daughter, original plan was to discharge patient home from [facility] on Thursday, but family has appealed the decision.  They are working to see if patient can transfer to LTC on the memory care unit.     SDOH assessments and interventions completed:  No     Care Coordination Interventions:  No, not indicated   Follow up plan:  Daughter will call when there is an updated discharge plan    Encounter Outcome:  Patient Visit Completed   Cody Langton, RN, MSN, CCM [place]  [facility], [facility] RN Care Coordinator Direct Dial: [phone] / Main [phone] Fax [phone] Email: [email or web address] Website: [email or web address]

## 2023-02-02 DIAGNOSIS — N184 Chronic kidney disease, stage 4 (severe): Secondary | ICD-10-CM | POA: Diagnosis not present

## 2023-02-02 DIAGNOSIS — I482 Chronic atrial fibrillation, unspecified: Secondary | ICD-10-CM | POA: Diagnosis not present

## 2023-02-02 DIAGNOSIS — F0390 Unspecified dementia without behavioral disturbance: Secondary | ICD-10-CM | POA: Diagnosis not present

## 2023-02-02 DIAGNOSIS — E871 Hypo-osmolality and hyponatremia: Secondary | ICD-10-CM | POA: Diagnosis not present

## 2023-02-02 DIAGNOSIS — R197 Diarrhea, unspecified: Secondary | ICD-10-CM | POA: Diagnosis not present

## 2023-02-02 DIAGNOSIS — I129 Hypertensive chronic kidney disease with stage 1 through stage 4 chronic kidney disease, or unspecified chronic kidney disease: Secondary | ICD-10-CM | POA: Diagnosis not present

## 2023-02-02 DIAGNOSIS — N492 Inflammatory disorders of scrotum: Secondary | ICD-10-CM | POA: Diagnosis not present

## 2023-02-02 DIAGNOSIS — E119 Type 2 diabetes mellitus without complications: Secondary | ICD-10-CM | POA: Diagnosis not present

## 2023-02-02 DIAGNOSIS — E785 Hyperlipidemia, unspecified: Secondary | ICD-10-CM | POA: Diagnosis not present

## 2023-02-02 DIAGNOSIS — N179 Acute kidney failure, unspecified: Secondary | ICD-10-CM | POA: Diagnosis not present

## 2023-02-02 DIAGNOSIS — R5381 Other malaise: Secondary | ICD-10-CM | POA: Diagnosis not present

## 2023-02-07 DIAGNOSIS — N184 Chronic kidney disease, stage 4 (severe): Secondary | ICD-10-CM | POA: Diagnosis not present

## 2023-02-07 DIAGNOSIS — E1122 Type 2 diabetes mellitus with diabetic chronic kidney disease: Secondary | ICD-10-CM | POA: Diagnosis not present

## 2023-02-07 DIAGNOSIS — N2581 Secondary hyperparathyroidism of renal origin: Secondary | ICD-10-CM | POA: Diagnosis not present

## 2023-02-07 DIAGNOSIS — I10 Essential (primary) hypertension: Secondary | ICD-10-CM | POA: Diagnosis not present

## 2023-02-08 ENCOUNTER — Telehealth: Payer: Self-pay | Admitting: *Deleted

## 2023-02-08 NOTE — Patient Outreach (Signed)
  Care Coordination   Follow Up Visit Note   02/08/2023 Name: Cody Howard MRN: [id number] DOB: 1934-04-21  Cody Howard is a 88 y.o. year old male who sees Duanne Limerick, MD for primary care. I spoke with Judeth Cornfield, daughter of NOEL THORMAHLEN by phone today.  What matters to the patients health and wellness today?  Per daughter, patient remains at SNF, now in copay days.  She is unsure of how much longer he will stay, but will be there until his wife recovers from her hospitalization more.      SDOH assessments and interventions completed:  No     Care Coordination Interventions:  No, not indicated   Follow up plan:  pending SNF disposition    Encounter Outcome:  Patient Visit Completed   Rodney Langton, RN, MSN, CCM [place]  [facility], [facility] RN Care Coordinator Direct Dial: [phone] / Main [phone] Fax [phone] Email: [email or web address]

## 2023-02-10 DIAGNOSIS — Z7984 Long term (current) use of oral hypoglycemic drugs: Secondary | ICD-10-CM | POA: Diagnosis not present

## 2023-02-10 DIAGNOSIS — I482 Chronic atrial fibrillation, unspecified: Secondary | ICD-10-CM | POA: Diagnosis not present

## 2023-02-10 DIAGNOSIS — N184 Chronic kidney disease, stage 4 (severe): Secondary | ICD-10-CM | POA: Diagnosis not present

## 2023-02-10 DIAGNOSIS — E119 Type 2 diabetes mellitus without complications: Secondary | ICD-10-CM | POA: Diagnosis not present

## 2023-02-10 DIAGNOSIS — E785 Hyperlipidemia, unspecified: Secondary | ICD-10-CM | POA: Diagnosis not present

## 2023-02-10 DIAGNOSIS — I129 Hypertensive chronic kidney disease with stage 1 through stage 4 chronic kidney disease, or unspecified chronic kidney disease: Secondary | ICD-10-CM | POA: Diagnosis not present

## 2023-02-10 DIAGNOSIS — K529 Noninfective gastroenteritis and colitis, unspecified: Secondary | ICD-10-CM | POA: Diagnosis not present

## 2023-02-10 DIAGNOSIS — F0390 Unspecified dementia without behavioral disturbance: Secondary | ICD-10-CM | POA: Diagnosis not present

## 2023-02-11 ENCOUNTER — Telehealth: Payer: Self-pay | Admitting: *Deleted

## 2023-02-11 ENCOUNTER — Telehealth: Payer: Self-pay | Admitting: Family Medicine

## 2023-02-11 NOTE — Patient Outreach (Signed)
  Care Coordination   Follow Up Visit Note   02/11/2023 Name: Cody Howard MRN: [id number] DOB: 1934-08-16  Cody Howard is a 88 y.o. year old male who sees Cody Limerick, MD for primary care. I spoke with Cody Howard, daughter of Cody Howard by phone today.  What matters to the patients health and wellness today?  Call received from daughter stating patient will be discharged from [facility] on Monday, inquiring about the plan for home health PT. Advised to discuss with CM team at facility and request orders be sent to agency of choice.       SDOH assessments and interventions completed:  No     Care Coordination Interventions:  Yes, provided   Follow up plan: Follow up call scheduled for 12/10    Encounter Outcome:  Patient Visit Completed   Rodney Langton, RN, MSN, CCM [place]  [facility], [facility] RN Care Coordinator Direct Dial: [phone] / Main [phone] Fax [phone] Email: [email or web address]: [email or web address]

## 2023-02-11 NOTE — Telephone Encounter (Signed)
Called and gave Clydie Braun verbal.  - Arsh Feutz

## 2023-02-11 NOTE — Telephone Encounter (Signed)
Copied from CRM [id number]. Topic: General - Other >> Feb 11, 2023 10:04 AM Phill Myron wrote: Home Health Verbal Orders - Caller/Agency: [facility] Number: [phone] Service Requested: Occupational Therapy/ PT and Nursing  Frequency: assessment

## 2023-02-15 ENCOUNTER — Ambulatory Visit: Payer: Self-pay | Admitting: *Deleted

## 2023-02-15 ENCOUNTER — Telehealth: Payer: Self-pay | Admitting: Family Medicine

## 2023-02-15 NOTE — Telephone Encounter (Signed)
.  Home Health Verbal Orders - Caller/Agency:  Darl Pikes, home health nurse with [facility]  Callback Number:  [phone] *VM Secured*  Service Requested:  Home Health   Frequency:  1 week 2  1Q 2 week 2  Any new concerns about the patient?  He just got home from a nursing home and just wants to get him started on Medication Management and make sure his transition home is covered.  *He also needs a follow up with PCP in the next 30 days, per Darl Pikes.

## 2023-02-15 NOTE — Patient Outreach (Addendum)
  Care Coordination   Follow Up Visit Note   02/15/2023 Name: Cody Howard MRN: [id number] DOB: [birth year]  Cody Howard is a 88 y.o. year old male who sees Duanne Limerick, MD for primary care. I spoke with Judeth Cornfield, daughter of Cody Howard by phone today.  What matters to the patients health and wellness today?  Per daughter, patient was discharged safely from SNF yesterday, adjusting back to home.  Denies any urgent concerns, encouraged to contact this care manager with questions.      Goals Addressed             This Visit's Progress    Management of chronic medical conditions       Interventions Today    Flowsheet Row Most Recent Value  Chronic Disease   Chronic disease during today's visit Diabetes, Congestive Heart Failure (CHF), Hypertension (HTN), Chronic Kidney Disease/End Stage Renal Disease (ESRD)  General Interventions   General Interventions Discussed/Reviewed General Interventions Reviewed, Labs, Doctor Visits  Labs Hgb A1c every 6 months  [most recent A1C 7.4]  Doctor Visits Discussed/Reviewed Doctor Visits Reviewed, PCP  [upcoming with PCP on 12/16]  PCP/Specialist Visits Compliance with follow-up visit  Education Interventions   Education Provided Provided Education  Provided Verbal Education On Medication, When to see the doctor, [employer] patient is active with [facility] for HHPT and OT]              SDOH assessments and interventions completed:  No     Care Coordination Interventions:  Yes, provided   Follow up plan: Follow up call scheduled for 12/27    Encounter Outcome:  Patient Visit Completed   Rodney Langton, RN, MSN, CCM [place]  [facility], [facility] RN Care Coordinator Direct Dial: [phone] / Main [phone] Fax [phone] Email: [email or web address] Website: [email or web address]

## 2023-02-16 NOTE — Telephone Encounter (Signed)
Called Darl Pikes gave verbal orders. She verbalized understanding.  KP

## 2023-02-21 ENCOUNTER — Ambulatory Visit (INDEPENDENT_AMBULATORY_CARE_PROVIDER_SITE_OTHER): Payer: Medicare HMO | Admitting: Family Medicine

## 2023-02-21 ENCOUNTER — Telehealth: Payer: Self-pay | Admitting: Family Medicine

## 2023-02-21 ENCOUNTER — Encounter: Payer: Self-pay | Admitting: Family Medicine

## 2023-02-21 VITALS — BP 122/60 | HR 73 | Ht 71.0 in | Wt 186.0 lb

## 2023-02-21 DIAGNOSIS — B379 Candidiasis, unspecified: Secondary | ICD-10-CM | POA: Diagnosis not present

## 2023-02-21 DIAGNOSIS — N492 Inflammatory disorders of scrotum: Secondary | ICD-10-CM

## 2023-02-21 MED ORDER — DOXYCYCLINE HYCLATE 100 MG PO TABS
100.0000 mg | ORAL_TABLET | Freq: Two times a day (BID) | ORAL | 0 refills | Status: DC
Start: 2023-02-21 — End: 2023-10-07

## 2023-02-21 MED ORDER — FLUCONAZOLE 150 MG PO TABS: 150.0000 mg | ORAL_TABLET | Freq: Once | ORAL | 2 refills | Status: AC

## 2023-02-21 MED ORDER — NYSTATIN 100000 UNIT/GM EX CREA: 1.0000 | TOPICAL_CREAM | Freq: Two times a day (BID) | CUTANEOUS | 10 refills | Status: AC

## 2023-02-21 NOTE — Telephone Encounter (Signed)
Called Stacey left VM to call back. Name was not stated on VM.  KP

## 2023-02-21 NOTE — Progress Notes (Signed)
Date:  02/21/2023   Name:  Cody Howard   DOB:  08/02/1934   MRN:  [id number]   Chief Complaint: Home Health and Testicle Pain (X 2-3 months, redness)  Testicle Pain The patient's primary symptoms include scrotal swelling and testicular pain. The patient's pertinent negatives include no genital itching, genital lesions or pelvic pain. Primary symptoms comment: erythema/. The current episode started in the past 7 days. The problem occurs intermittently. The problem has been gradually improving. The pain is mild. Pertinent negatives include no abdominal pain, anorexia, chest pain, chills, constipation, coughing, diarrhea, discolored urine, dysuria, fever, flank pain, frequency, headaches, hematuria, hesitancy, joint pain, joint swelling, nausea, painful intercourse, rash, shortness of breath, sore throat, urgency, urinary retention or vomiting.    Lab Results  Component Value Date   NA 136 01/14/2023   K 4.7 01/14/2023   CO2 23 01/14/2023   GLUCOSE 152 (H) 01/14/2023   BUN 65 (H) 01/14/2023   CREATININE 2.69 (H) 01/14/2023   CALCIUM 8.1 (L) 01/14/2023   EGFR 21 (L) 11/25/2022   GFRNONAA 22 (L) 01/14/2023   Lab Results  Component Value Date   CHOL 146 06/01/2021   HDL 55 06/01/2021   LDLCALC 73 06/01/2021   TRIG 98 06/01/2021   CHOLHDL 2.3 11/29/2016   Lab Results  Component Value Date   TSH 2.710 05/31/2019   Lab Results  Component Value Date   HGBA1C 7.4 09/24/2022   Lab Results  Component Value Date   WBC 7.2 01/09/2023   HGB 10.0 (L) 01/09/2023   HCT 29.4 (L) 01/09/2023   MCV 96.4 01/09/2023   PLT 112 (L) 01/09/2023   Lab Results  Component Value Date   ALT 31 06/01/2021   AST 32 06/01/2021   ALKPHOS 91 06/01/2021   BILITOT 0.3 06/01/2021   No results found for: "25OHVITD2", "25OHVITD3", "VD25OH"   Review of Systems  Constitutional:  Negative for chills and fever.  HENT:  Negative for sore throat.   Respiratory:  Negative for cough and shortness of  breath.   Cardiovascular:  Negative for chest pain.  Gastrointestinal:  Negative for abdominal pain, anorexia, constipation, diarrhea, nausea and vomiting.  Genitourinary:  Positive for scrotal swelling and testicular pain. Negative for dysuria, flank pain, frequency, hesitancy, pelvic pain and urgency.  Musculoskeletal:  Negative for joint pain.  Skin:  Negative for rash.  Neurological:  Negative for headaches.    Patient Active Problem List   Diagnosis Date Noted   Hyperkalemia 01/10/2023   Metabolic acidosis 01/09/2023   Hyponatremia 01/09/2023   Acute renal failure superimposed on stage 4 chronic kidney disease (HCC) 01/08/2023   Acute rhinosinusitis 12/08/2020   Difficulty walking 04/24/2020   Sleeping difficulty 04/24/2020   Moderate mitral regurgitation 09/12/2019   Status post hip hemiarthroplasty 06/28/2019   Atrial fibrillation, chronic (HCC) 06/27/2019   Depression 06/27/2019   Fall at home, initial encounter 06/27/2019   Closed displaced fracture of left femoral neck (HCC) 06/27/2019   History of anemia 05/31/2019   Benign prostatic hyperplasia with lower urinary tract symptoms 05/31/2019   Cerebral atrophy, mild (HCC) 05/31/2019   Reactive depression 05/31/2019   Essential hypertension 05/31/2019   Hiatal hernia 05/31/2019   Cardiac syncope 02/20/2019   Loss of memory 05/25/2018   Dizziness 04/12/2018   TIA (transient ischemic attack) 01/25/2018   Chronic systolic CHF (congestive heart failure), NYHA class 3 (HCC) 11/29/2017   CKD (chronic kidney disease) stage 3, GFR 30-59 ml/min (HCC) 08/18/2017  Bradycardia 08/18/2017   Atrial flutter, paroxysmal (HCC) 07/21/2017   Functional dyspnea 07/06/2017   Cardiomyopathy (HCC) 07/06/2017   Hyperlipidemia, mixed 06/20/2017   Hyperlipidemia associated with type 2 diabetes mellitus (HCC) 04/06/2016   Type II diabetes mellitus with renal manifestations (HCC) 02/10/2015   Personal history of other diseases of male genital  organs 02/10/2015   Sebaceous cyst 08/28/2014    No Known Allergies  Past Surgical History:  Procedure Laterality Date   COLONOSCOPY N/A 02/26/2015   Procedure: COLONOSCOPY;  Surgeon: Wallace Cullens, MD;  Location: [facility];  Service: Gastroenterology;  Laterality: N/A;   COLONOSCOPY WITH PROPOFOL N/A 11/02/2017   Procedure: COLONOSCOPY WITH PROPOFOL;  Surgeon: Toledo, Boykin Nearing, MD;  Location: [facility];  Service: Gastroenterology;  Laterality: N/A;   ESOPHAGOGASTRODUODENOSCOPY N/A 02/26/2015   Procedure: ESOPHAGOGASTRODUODENOSCOPY (EGD);  Surgeon: Wallace Cullens, MD;  Location: [facility];  Service: Gastroenterology;  Laterality: N/A;  Diabetic - oral meds   ESOPHAGOGASTRODUODENOSCOPY (EGD) WITH PROPOFOL N/A 11/02/2017   Procedure: ESOPHAGOGASTRODUODENOSCOPY (EGD) WITH PROPOFOL;  Surgeon: Toledo, Boykin Nearing, MD;  Location: [facility];  Service: Gastroenterology;  Laterality: N/A;   HIP ARTHROPLASTY Left 06/28/2019   Procedure: ARTHROPLASTY BIPOLAR HIP (HEMIARTHROPLASTY);  Surgeon: Christena Flake, MD;  Location: [facility];  Service: Orthopedics;  Laterality: Left;   KIDNEY SURGERY Right    surgery when 88 years old   POLYPECTOMY  02/26/2015   Procedure: POLYPECTOMY;  Surgeon: Wallace Cullens, MD;  Location: [facility];  Service: Gastroenterology;;   sebaceous cyst removal Right    located right of spine on upper back    Social History   Tobacco Use   Smoking status: Former    Current packs/day: 0.00    Types: Cigarettes    Quit date: 03/08/1978    Years since quitting: 44.9   Smokeless tobacco: Never  Vaping Use   Vaping status: Never Used  Substance Use Topics   Alcohol use: Not Currently   Drug use: Never     Medication list has been reviewed and updated.  Current Meds  Medication Sig   amLODipine (NORVASC) 10 MG tablet Take by mouth.   amLODipine (NORVASC) 5 MG tablet Take 1 tablet (5 mg total) by mouth daily.   apixaban (ELIQUIS) 2.5 MG TABS tablet  Take 2.5 mg by mouth 2 (two) times daily.   Calcium Citrate-Vitamin D (CITRACAL + D PO) Take 1 tablet by mouth daily.   chlorhexidine (HIBICLENS) 4 % external liquid Apply topically daily as needed.   Cholecalciferol 125 MCG (5000 UT) TABS Take 1 tablet (5,000 Units total) by mouth daily.   ferrous sulfate (FEROSUL) 325 (65 FE) MG tablet Take 1 tablet (325 mg total) by mouth daily with breakfast.   fluticasone (FLONASE ALLERGY RELIEF) 50 MCG/ACT nasal spray Place into the nose.   glipiZIDE (GLUCOTROL) 5 MG tablet TAKE (1) TABLET BY MOUTH TWICE DAILY BEFORE MEALS.   losartan (COZAAR) 50 MG tablet Take 50 mg by mouth daily.   memantine (NAMENDA) 5 MG tablet Take 5 mg by mouth 2 (two) times daily.   montelukast (SINGULAIR) 10 MG tablet Take 1 tablet (10 mg total) by mouth at bedtime.   mupirocin ointment (BACTROBAN) 2 % Apply 1 Application topically 2 (two) times daily.   nystatin cream (MYCOSTATIN) Apply 1 Application topically 2 (two) times daily.   ondansetron (ZOFRAN) 4 MG tablet Take by mouth.   Probiotic Product (ALIGN) 4 MG CAPS Take 1 capsule (4 mg total) by  mouth at bedtime.   sertraline (ZOLOFT) 25 MG tablet Take 1 tablet (25 mg total) by mouth daily.   simvastatin (ZOCOR) 80 MG tablet Take 1 tablet (80 mg total) by mouth every evening.   Zinc Oxide 16 % OINT Apply topically. 20%       02/21/2023    3:27 PM 12/31/2022    4:10 PM 11/25/2022    1:52 PM 07/09/2022   10:33 AM  GAD 7 : Generalized Anxiety Score  Nervous, Anxious, on Edge 0 0 0 0  Control/stop worrying 0 0 0 0  Worry too much - different things 0 0 0 0  Trouble relaxing 0 0 0 0  Restless 0 0 0 0  Easily annoyed or irritable 0 0 0 0  Afraid - awful might happen 0 0 0 0  Total GAD 7 Score 0 0 0 0  Anxiety Difficulty Not difficult at all Not difficult at all Not difficult at all Not difficult at all       02/21/2023    3:26 PM 12/31/2022    4:10 PM 11/25/2022    1:52 PM  Depression screen PHQ 2/9  Decreased  Interest 0 0 0  Down, Depressed, Hopeless 0 0 0  PHQ - 2 Score 0 0 0  Altered sleeping 0 0 0  Tired, decreased energy 0 0 0  Change in appetite 0 0 0  Feeling bad or failure about yourself  0 0 0  Trouble concentrating 0 0 0  Moving slowly or fidgety/restless 0 0 0  Suicidal thoughts 0 0 0  PHQ-9 Score 0 0 0  Difficult doing work/chores Not difficult at all Not difficult at all Not difficult at all    BP Readings from Last 3 Encounters:  02/21/23 122/60  01/14/23 (!) 152/62  12/31/22 (!) 110/56    Physical Exam Vitals and nursing note reviewed.  HENT:     Head: Normocephalic.     Right Ear: Tympanic membrane and external ear normal. There is no impacted cerumen.     Left Ear: Tympanic membrane and external ear normal. There is no impacted cerumen.     Nose: Nose normal. No congestion or rhinorrhea.     Mouth/Throat:     Mouth: Mucous membranes are moist.     Pharynx: No oropharyngeal exudate or posterior oropharyngeal erythema.  Eyes:     General: No scleral icterus.       Right eye: No discharge.        Left eye: No discharge.     Conjunctiva/sclera: Conjunctivae normal.     Pupils: Pupils are equal, round, and reactive to light.  Neck:     Thyroid: No thyromegaly.     Vascular: No JVD.     Trachea: No tracheal deviation.  Cardiovascular:     Rate and Rhythm: Normal rate and regular rhythm.     Heart sounds: Normal heart sounds. No murmur heard.    No friction rub. No gallop.  Pulmonary:     Effort: No respiratory distress.     Breath sounds: Normal breath sounds. No stridor. No wheezing, rhonchi or rales.  Chest:     Chest wall: No tenderness.  Abdominal:     General: Bowel sounds are normal.     Palpations: Abdomen is soft. There is no mass.     Tenderness: There is no abdominal tenderness. There is no guarding or rebound.  Musculoskeletal:        General: No tenderness. Normal range  of motion.     Cervical back: Normal range of motion and neck supple.   Lymphadenopathy:     Cervical: No cervical adenopathy.  Skin:    General: Skin is warm.     Findings: Erythema and rash present.     Comments: desquamation  Neurological:     Mental Status: He is alert.     Deep Tendon Reflexes: Reflexes are normal and symmetric.     Wt Readings from Last 3 Encounters:  02/21/23 186 lb (84.4 kg)  01/10/23 181 lb 10.5 oz (82.4 kg)  12/31/22 191 lb (86.6 kg)    BP 122/60 (BP Location: Left Arm, Patient Position: Sitting)   Pulse 73   Ht 5\' 11"  (1.803 m)   Wt 186 lb (84.4 kg)   SpO2 96%   BMI 25.94 kg/m   Assessment and Plan: 1. Cellulitis, scrotum (Primary) Chronic.  Persistent.  Patient has been having erythema with tenderness of the scrotum.  Patient is having urinary incontinence which is probably contributing to a combination of an irritant dermatitis due to contact a urine and yeast and likely secondary bacterial infection.  We will treat the scrotum with both an antibiotic as well as refer to urology to see if we can get a condom catheter for him to keep him drier during the day and not precipitate this perpetuation of the scrotal irritation.  We will treat with doxycycline 100 mg twice a day for 10 days and refer to urology for other treatment options but empiric particular to try to prevent this from recurring with a condom catheter. - doxycycline (VIBRA-TABS) 100 MG tablet; Take 1 tablet (100 mg total) by mouth 2 (two) times daily.  Dispense: 20 tablet; Refill: 0 - Ambulatory referral to Urology  2. Candidiasis Chronic.  Episodic.  Again having yeast because of the patient having persistent moisture and darkness and warmth precipitates for candidiasis infection.  We will refill the nystatin cream and initiate Diflucan 1 tablet to be repeated weekly x 2 thereafter.  Referral has been placed to urology to help prevent this from continuance with perhaps a condom catheter in place or other means as suggested. - nystatin cream (MYCOSTATIN);  Apply 1 Application topically 2 (two) times daily.  Dispense: 30 g; Refill: 10 - fluconazole (DIFLUCAN) 150 MG tablet; Take 1 tablet (150 mg total) by mouth once for 1 dose.  Dispense: 1 tablet; Refill: 2 - Ambulatory referral to Urology     Elizabeth Sauer, MD

## 2023-02-21 NOTE — Telephone Encounter (Signed)
Copied from CRM [id number]. Topic: General - Other >> Feb 21, 2023  3:51 PM Phill Myron wrote: Home Health Verbal Orders - Caller/Agency:STacy with [facility]: Home Health Care Callback [phone]  Service Requested: Physical Therapy Frequency: 2w1,  1w4  Any new concerns about the patient? No

## 2023-02-21 NOTE — Telephone Encounter (Signed)
Pts

## 2023-02-21 NOTE — Telephone Encounter (Signed)
Called Misty Stanley gave her a verbal order. She verbalized understanding.  KP

## 2023-02-28 ENCOUNTER — Ambulatory Visit (INDEPENDENT_AMBULATORY_CARE_PROVIDER_SITE_OTHER): Payer: Medicare HMO | Admitting: Family Medicine

## 2023-02-28 ENCOUNTER — Encounter: Payer: Self-pay | Admitting: Family Medicine

## 2023-02-28 VITALS — BP 124/69 | HR 86 | Temp 96.0°F | Ht 71.0 in | Wt 183.0 lb

## 2023-02-28 DIAGNOSIS — L22 Diaper dermatitis: Secondary | ICD-10-CM | POA: Diagnosis not present

## 2023-02-28 DIAGNOSIS — L89321 Pressure ulcer of left buttock, stage 1: Secondary | ICD-10-CM

## 2023-02-28 DIAGNOSIS — N3945 Continuous leakage: Secondary | ICD-10-CM

## 2023-02-28 NOTE — Progress Notes (Signed)
Date:  02/28/2023   Name:  Cody Howard   DOB:  Feb 02, 1935   MRN:  [id number]   Chief Complaint:  Red Scrotum (Pt state scrotum is red and they found blood on Pt depends near scrotum. Pt has a red sore on his buttocks that is red and sensitive to the touch.)  .  Rash This is a chronic problem. The current episode started more than 1 month ago. The problem is unchanged. The affected locations include the groin and genitalia (buttock). The rash is characterized by redness. Pertinent negatives include no fever or shortness of breath. Treatments tried: Nystatin and Vaseline. The treatment provided no relief.  Urinary Frequency  This is a chronic (For lack of template for urinary incontinence on using urinary frequency.) problem. The current episode started more than 1 month ago. The problem has been unchanged. Associated symptoms include frequency. Pertinent negatives include no discharge or hesitancy. The treatment provided no relief. There is no history of catheterization, recurrent UTIs or urinary stasis.    Lab Results  Component Value Date   NA 136 01/14/2023   K 4.7 01/14/2023   CO2 23 01/14/2023   GLUCOSE 152 (H) 01/14/2023   BUN 65 (H) 01/14/2023   CREATININE 2.69 (H) 01/14/2023   CALCIUM 8.1 (L) 01/14/2023   EGFR 21 (L) 11/25/2022   GFRNONAA 22 (L) 01/14/2023   Lab Results  Component Value Date   CHOL 146 06/01/2021   HDL 55 06/01/2021   LDLCALC 73 06/01/2021   TRIG 98 06/01/2021   CHOLHDL 2.3 11/29/2016   Lab Results  Component Value Date   TSH 2.710 05/31/2019   Lab Results  Component Value Date   HGBA1C 7.4 09/24/2022   Lab Results  Component Value Date   WBC 7.2 01/09/2023   HGB 10.0 (L) 01/09/2023   HCT 29.4 (L) 01/09/2023   MCV 96.4 01/09/2023   PLT 112 (L) 01/09/2023   Lab Results  Component Value Date   ALT 31 06/01/2021   AST 32 06/01/2021   ALKPHOS 91 06/01/2021   BILITOT 0.3 06/01/2021   No results found for: "25OHVITD2", "25OHVITD3",  "VD25OH"   Review of Systems  Constitutional:  Negative for fever.  Respiratory:  Negative for shortness of breath.   Genitourinary:  Positive for frequency and genital sores. Negative for difficulty urinating, dysuria, hesitancy, penile swelling and scrotal swelling.       Stage I left buttock breakdown consistent with decubitus due to pressure and constant contact with urine.  Skin:  Positive for rash.    Patient Active Problem List   Diagnosis Date Noted   Hyperkalemia 01/10/2023   Metabolic acidosis 01/09/2023   Hyponatremia 01/09/2023   Acute renal failure superimposed on stage 4 chronic kidney disease (HCC) 01/08/2023   Acute rhinosinusitis 12/08/2020   Difficulty walking 04/24/2020   Sleeping difficulty 04/24/2020   Moderate mitral regurgitation 09/12/2019   Status post hip hemiarthroplasty 06/28/2019   Atrial fibrillation, chronic (HCC) 06/27/2019   Depression 06/27/2019   Fall at home, initial encounter 06/27/2019   Closed displaced fracture of left femoral neck (HCC) 06/27/2019   History of anemia 05/31/2019   Benign prostatic hyperplasia with lower urinary tract symptoms 05/31/2019   Cerebral atrophy, mild (HCC) 05/31/2019   Reactive depression 05/31/2019   Essential hypertension 05/31/2019   Hiatal hernia 05/31/2019   Cardiac syncope 02/20/2019   Loss of memory 05/25/2018   Dizziness 04/12/2018   TIA (transient ischemic attack) 01/25/2018   Chronic systolic CHF (  congestive heart failure), NYHA class 3 (HCC) 11/29/2017   CKD (chronic kidney disease) stage 3, GFR 30-59 ml/min (HCC) 08/18/2017   Bradycardia 08/18/2017   Atrial flutter, paroxysmal (HCC) 07/21/2017   Functional dyspnea 07/06/2017   Cardiomyopathy (HCC) 07/06/2017   Hyperlipidemia, mixed 06/20/2017   Hyperlipidemia associated with type 2 diabetes mellitus (HCC) 04/06/2016   Type II diabetes mellitus with renal manifestations (HCC) 02/10/2015   Personal history of other diseases of male genital organs  02/10/2015   Sebaceous cyst 08/28/2014    No Known Allergies  Past Surgical History:  Procedure Laterality Date   COLONOSCOPY N/A 02/26/2015   Procedure: COLONOSCOPY;  Surgeon: Wallace Cullens, MD;  Location: [facility];  Service: Gastroenterology;  Laterality: N/A;   COLONOSCOPY WITH PROPOFOL N/A 11/02/2017   Procedure: COLONOSCOPY WITH PROPOFOL;  Surgeon: Toledo, Boykin Nearing, MD;  Location: [facility];  Service: Gastroenterology;  Laterality: N/A;   ESOPHAGOGASTRODUODENOSCOPY N/A 02/26/2015   Procedure: ESOPHAGOGASTRODUODENOSCOPY (EGD);  Surgeon: Wallace Cullens, MD;  Location: [facility];  Service: Gastroenterology;  Laterality: N/A;  Diabetic - oral meds   ESOPHAGOGASTRODUODENOSCOPY (EGD) WITH PROPOFOL N/A 11/02/2017   Procedure: ESOPHAGOGASTRODUODENOSCOPY (EGD) WITH PROPOFOL;  Surgeon: Toledo, Boykin Nearing, MD;  Location: [facility];  Service: Gastroenterology;  Laterality: N/A;   HIP ARTHROPLASTY Left 06/28/2019   Procedure: ARTHROPLASTY BIPOLAR HIP (HEMIARTHROPLASTY);  Surgeon: Christena Flake, MD;  Location: [facility];  Service: Orthopedics;  Laterality: Left;   KIDNEY SURGERY Right    surgery when 88 years old   POLYPECTOMY  02/26/2015   Procedure: POLYPECTOMY;  Surgeon: Wallace Cullens, MD;  Location: [facility];  Service: Gastroenterology;;   sebaceous cyst removal Right    located right of spine on upper back    Social History   Tobacco Use   Smoking status: Former    Current packs/day: 0.00    Types: Cigarettes    Quit date: 03/08/1978    Years since quitting: 45.0   Smokeless tobacco: Never  Vaping Use   Vaping status: Never Used  Substance Use Topics   Alcohol use: Not Currently   Drug use: Never     Medication list has been reviewed and updated.  Current Meds  Medication Sig   amLODipine (NORVASC) 10 MG tablet Take by mouth.   amLODipine (NORVASC) 5 MG tablet Take 1 tablet (5 mg total) by mouth daily.   apixaban (ELIQUIS) 2.5 MG TABS tablet Take 2.5  mg by mouth 2 (two) times daily.   Calcium Citrate-Vitamin D (CITRACAL + D PO) Take 1 tablet by mouth daily.   chlorhexidine (HIBICLENS) 4 % external liquid Apply topically daily as needed.   Cholecalciferol 125 MCG (5000 UT) TABS Take 1 tablet (5,000 Units total) by mouth daily.   doxycycline (VIBRA-TABS) 100 MG tablet Take 1 tablet (100 mg total) by mouth 2 (two) times daily.   ferrous sulfate (FEROSUL) 325 (65 FE) MG tablet Take 1 tablet (325 mg total) by mouth daily with breakfast.   fluticasone (FLONASE ALLERGY RELIEF) 50 MCG/ACT nasal spray Place into the nose.   glipiZIDE (GLUCOTROL) 5 MG tablet TAKE (1) TABLET BY MOUTH TWICE DAILY BEFORE MEALS.   losartan (COZAAR) 50 MG tablet Take 50 mg by mouth daily.   memantine (NAMENDA) 5 MG tablet Take 5 mg by mouth 2 (two) times daily.   montelukast (SINGULAIR) 10 MG tablet Take 1 tablet (10 mg total) by mouth at bedtime.   mupirocin ointment (BACTROBAN) 2 % Apply 1 Application topically  2 (two) times daily.   nystatin cream (MYCOSTATIN) Apply 1 Application topically 2 (two) times daily.   ondansetron (ZOFRAN) 4 MG tablet Take by mouth.   Probiotic Product (ALIGN) 4 MG CAPS Take 1 capsule (4 mg total) by mouth at bedtime.   sertraline (ZOLOFT) 25 MG tablet Take 1 tablet (25 mg total) by mouth daily.   Zinc Oxide 16 % OINT Apply topically. 20%       02/28/2023    4:00 PM 02/21/2023    3:27 PM 12/31/2022    4:10 PM 11/25/2022    1:52 PM  GAD 7 : Generalized Anxiety Score  Nervous, Anxious, on Edge 0 0 0 0  Control/stop worrying 0 0 0 0  Worry too much - different things 0 0 0 0  Trouble relaxing 0 0 0 0  Restless 0 0 0 0  Easily annoyed or irritable 0 0 0 0  Afraid - awful might happen 0 0 0 0  Total GAD 7 Score 0 0 0 0  Anxiety Difficulty Not difficult at all Not difficult at all Not difficult at all Not difficult at all       02/28/2023    4:00 PM 02/21/2023    3:26 PM 12/31/2022    4:10 PM  Depression screen PHQ 2/9   Decreased Interest 0 0 0  Down, Depressed, Hopeless 0 0 0  PHQ - 2 Score 0 0 0  Altered sleeping 0 0 0  Tired, decreased energy 0 0 0  Change in appetite 0 0 0  Feeling bad or failure about yourself  0 0 0  Trouble concentrating 0 0 0  Moving slowly or fidgety/restless 0 0 0  Suicidal thoughts 0 0 0  PHQ-9 Score 0 0 0  Difficult doing work/chores Not difficult at all Not difficult at all Not difficult at all    BP Readings from Last 3 Encounters:  02/28/23 124/69  02/21/23 122/60  01/14/23 (!) 152/62    Physical Exam Vitals and nursing note reviewed.  HENT:     Mouth/Throat:     Mouth: Mucous membranes are moist.  Cardiovascular:     Rate and Rhythm: Normal rate.     Heart sounds: Normal heart sounds. No murmur heard.    No gallop.  Pulmonary:     Breath sounds: No wheezing, rhonchi or rales.  Abdominal:     Tenderness: There is no abdominal tenderness. There is no guarding.  Skin:    Findings: Erythema, lesion and rash present.     Wt Readings from Last 3 Encounters:  02/28/23 183 lb (83 kg)  02/21/23 186 lb (84.4 kg)  01/10/23 181 lb 10.5 oz (82.4 kg)    BP 124/69   Pulse 86   Temp (!) 96 F (35.6 C)   Ht 5\' 11"  (1.803 m)   Wt 183 lb (83 kg)   BMI 25.52 kg/m   Assessment and Plan: At this point this is a suboptimal situation with 2 frail lady is trying to Take Care of a completely helpless male who cannot stand on his own cannot support his own weight is having to have all caretaking chores done and for the most part is sitting the majority of the day and urine soaked depends secondary to constant urination leakage.  The 2 ladies can I do not think can adequately bathe him nor keep him clean and dry to prevent further deterioration of the below concerns. 1. Pressure injury of left buttock, stage 1 (  Primary) There appears to be a stage I decubitus on the left buttock more excoriated but not involving anything other than the superficial with surrounding  erythema I do not think that there is a cellulitis but I do think that there is a degree of epithelial compromise.  We will refer to wound care clinic in the meantime I have stressed the absolute importance that we must try to keep him as dry as possible this is going to involve a daily shower in which the urine is removed from the skin surface with soap and water dried well and frequent changing of his depends more or less as a baby would constantly be wetting and needing to be cleaned and dried and with a clean dry dependent surface to be provided as best can I do not think this can be adequately done with the 2 caretakers the wife and the other for hired caretaker to do this adequately. - AMB referral to wound care center  2. Diaper dermatitis Basically as a diaper dermatitis situation secondary to constant urinary contact and patient spends the majority of his time sitting I do think that this will ultimately lead to some concerns and we will place referral to urology that we can devise a condom catheter situation and instruct the son and/or daughter how to replace the condom catheter on a regular basis so as not to constantly have urine in the vicinity of the skin and scrotum and that this can then be allowed to get clean skin surface and to remain as dry as possible. - Ambulatory referral to Urology  3. Continuous leakage of urine Patient has continuous leakage of urine due to incontinence and I feel that this needs to be addressed with a condom catheter in place so that not to have an indwelling situation that may result in infection and that we remove urine from the vicinity of the genitalia and groin area. - Ambulatory referral to Urology   I brought to the attention of the son that I do not think that this is the long-term answer with these 2 people to take care of him and that we may need to have more difficult decisions in terms of how to adequately position move provide a cleaning on a daily  basis and a almost constant reassessment of the toilet concern of the genitalia coming into contact with urine on a almost constant basis.  I am not sure if there is full understanding that in the not-too-distant future we were going to need a care taking decision this going to involve something of a skilled nursing level situation. Elizabeth Sauer, MD

## 2023-03-01 ENCOUNTER — Encounter: Payer: Self-pay | Admitting: Family Medicine

## 2023-03-04 ENCOUNTER — Ambulatory Visit: Payer: Self-pay | Admitting: *Deleted

## 2023-03-04 NOTE — Patient Outreach (Signed)
  Care Coordination   Follow Up Visit Note   03/04/2023 Name: DOC TEA MRN: [id number] DOB: Jun 12, 1934  Cody Howard is a 88 y.o. year old male who sees Duanne Limerick, MD for primary care. I spoke with Judeth Cornfield, daughter of CHEYENE STUDE by phone today.  What matters to the patients health and wellness today?  Daughter state patient remains slow to recover, but appetite is increasing.  [facility] remains involved in care for home health services. Denies any urgent concerns, encouraged to contact this care manager with questions.     Goals Addressed             This Visit's Progress    Management of chronic medical conditions   On track    Interventions Today    Flowsheet Row Most Recent Value  Chronic Disease   Chronic disease during today's visit Diabetes, Congestive Heart Failure (CHF), Chronic Kidney Disease/End Stage Renal Disease (ESRD), Other  [sacral wounds due to incontinence]  General Interventions   General Interventions Discussed/Reviewed General Interventions Reviewed, Labs, Doctor Visits, Level of Care  Labs Hgb A1c every 6 months  [will have repeat A1C done with next PCP visit]  Doctor Visits Discussed/Reviewed Doctor Visits Reviewed, PCP, Specialist  [new PCP 1/15, endocrine 1/20, urology 1/22]  PCP/Specialist Visits Compliance with follow-up visit  Level of Care Personal Care Services  [Will have new personal care aide services starting on Monday, providing daily care]  Education Interventions   Education Provided Provided Education  Provided Verbal Education On Blood Sugar Monitoring, Other, Medication, When to see the doctor, Nutrition  [Discussed skin care for wounds and importance of maintaining good nutrition and blood sugar for proper healing]              SDOH assessments and interventions completed:  No     Care Coordination Interventions:  Yes, provided   Follow up plan: Follow up call scheduled for 1/27    Encounter Outcome:   Patient Visit Completed   Rodney Langton, RN, MSN, CCM [place]  [facility], [facility] RN Care Coordinator Direct Dial: [phone] / Main [phone] Fax [phone] Email: [email or web address]

## 2023-03-18 ENCOUNTER — Telehealth: Payer: Self-pay | Admitting: *Deleted

## 2023-03-18 NOTE — Patient Outreach (Signed)
  Care Coordination   Follow Up Visit Note   03/18/2023 Name: Cody Howard MRN: [id number] DOB: Jan 24, 1935  Cody Howard is a 89 y.o. year old male who sees Cody Cathryne BROCKS, MD for primary care. I spoke with Cody Howard, daughter of Cody Howard by phone today.  What matters to the patients health and wellness today?  Daughter called stating during the last appointment with PCP referral to wound clinic was done but she has not received call.  Noted that it was placed to [facility] wound clinic and per chart message was left to call for appointment.  Contact information for clinic provided, advised to call next week to schedule.     Goals Addressed             This Visit's Progress    Management of chronic medical conditions   On track    Interventions Today    Flowsheet Row Most Recent Value  Chronic Disease   Chronic disease during today's visit Other  [pressure injury]  General Interventions   General Interventions Discussed/Reviewed General Interventions Reviewed, Doctor Visits  [per note, wound clinic referral was sent]  Doctor Visits Discussed/Reviewed Specialist, Doctor Visits Reviewed  [Daughter will call wound clinic to schedule appointment]              SDOH assessments and interventions completed:  No     Care Coordination Interventions:  Yes, provided   Follow up plan: Follow up call scheduled for 1/27    Encounter Outcome:  Patient Visit Completed   Cody Ku, RN, MSN, CCM [place]  [facility], [facility] RN Care Coordinator Direct Dial: [phone] / Main [phone] Fax [phone] Email: [email or web address]

## 2023-03-22 DIAGNOSIS — D631 Anemia in chronic kidney disease: Secondary | ICD-10-CM | POA: Diagnosis not present

## 2023-03-22 DIAGNOSIS — E1122 Type 2 diabetes mellitus with diabetic chronic kidney disease: Secondary | ICD-10-CM | POA: Diagnosis not present

## 2023-03-22 DIAGNOSIS — I10 Essential (primary) hypertension: Secondary | ICD-10-CM | POA: Diagnosis not present

## 2023-03-22 DIAGNOSIS — N2581 Secondary hyperparathyroidism of renal origin: Secondary | ICD-10-CM | POA: Diagnosis not present

## 2023-03-22 DIAGNOSIS — N184 Chronic kidney disease, stage 4 (severe): Secondary | ICD-10-CM | POA: Diagnosis not present

## 2023-03-22 NOTE — Progress Notes (Signed)
 Follow Up Visit   Patient Name: Cody Howard, male   Patient DOB: 06-01-1934 Date of Service: 03/22/2023  Patient MRN: [id number] Provider Creating Note: Bonnell Sherry, MD  [phone] Primary Care Physician:   [address] [place] [address] Additional Physicians/ Providers:    History of Present Illness Cody Howard is a 89 y.o. male who is following up today for chronic kidney disease stage IV, diabetes mellitus type 2 with chronic kidney disease, hypertension, anemia of chronic kidney disease, and secondary hyperparathyroidism.  The patient chronic kidney disease appears to be relatively stable most recent EGFR 23 with urine protein creatinine ratio 3.1 and hemoglobin A1c of 7.4.  InterVax hypertension blood pressure currently 120/60.  He also has anemia of chronic kidney disease most recent hemoglobin 11.9.  Patient also has secondary hyperparathyroidism with most recent PTH of 88, phosphorus 3.6, calcium  8.4.  Patient denies any uremic symptoms.  He was recently at a rehabilitation facility after he had scrotal cellulitis and acute kidney injury.  Medications   Current Outpatient Medications:  .  apixaban  (ELIQUIS ) 2.5 MG tablet, Take 2.5 mg by mouth in the morning and 2.5 mg in the evening., Disp: , Rfl:  .  Cholecalciferol  125 MCG (5000 UT) tablet, Take 125 mcg by mouth, Disp: , Rfl:  .  ferrous sulfate  325 (65 Fe) MG tablet, Take 325 mg by mouth, Disp: , Rfl:  .  glipiZIDE  (GLUCOTROL ) 5 MG tablet, , Disp: , Rfl:  .  losartan  (COZAAR ) 50 MG tablet, Take 50 mg by mouth in the morning., Disp: , Rfl:  .  montelukast  (SINGULAIR ) 10 MG tablet, Take 10 mg by mouth, Disp: , Rfl:  .  Probiotic Product (Align) 4 MG capsule, Take 4 mg by mouth, Disp: , Rfl:  .  sertraline  (ZOLOFT ) 25 MG tablet, Take 25 mg by mouth in the morning., Disp: , Rfl:  .  simvastatin  (ZOCOR ) 80 MG tablet, Take 80 mg by mouth daily, Disp: , Rfl:    Allergies Patient has no known allergies.  Problem List Patient Active  Problem List  Diagnosis  . Type 2 diabetes mellitus without complication (HCC)  . Stage 3 chronic kidney disease (HCC)     Review of Systems  Constitutional:  Positive for malaise/fatigue. Negative for chills and fever.  Respiratory:  Negative for cough and shortness of breath.   Cardiovascular:  Negative for chest pain and palpitations.  Gastrointestinal:  Negative for nausea and vomiting.  Genitourinary:  Negative for dysuria, hematuria and urgency.  Neurological:  Negative for weakness.     History Past Medical History:  Diagnosis Date  . Atrial fibrillation (HCC)   . Bradycardia   . Cardiomyopathy (HCC)   . Chronic kidney disease, stage 3 unspecified 12/08/2018  . Chronic systolic heart failure (HCC)   . Dizziness   . Dyspnea   . History of transient ischemic attack   . Other and unspecified hyperlipidemia   . Personal history of other disease of male genital organ   . Sebaceous cyst   . Type 2 diabetes mellitus without complication (HCC) 12/08/2018    Past Surgical History:  Procedure Laterality Date  . KIDNEY SURGERY     Right side..pt was 89 years old   Family History  Problem Relation Age of Onset  . Cancer Father   . Diabetes Father   . Dementia Father    Social History   Tobacco Use  . Smoking status: Former  . Smokeless tobacco: Never  Substance Use Topics  .  Alcohol use: Not Currently        Physical Exam  Vitals BP 120/60 (BP Location: Right upper arm, Patient Position: Sitting)   Pulse 78   Temp 98 F   Wt 190 lb (86.2 kg)   SpO2 96%   BMI 28.89 kg/m   PHYSICAL EXAM: General appearance: well developed, well nourished, NAD Eyes: anicteric sclerae, moist conjunctivae; no lid-lag  HENT: Atraumatic; hearing intact Neck: Trachea midline; supple Lungs: CTAB, with normal respiratory effort  CV: S1S2, irregular Abdomen: Soft, non-tender; bowel sounds present Extremities: No peripheral edema Skin: Warm and dry, normal skin turgor, no  rashes noted. Psych: Appropriate affect, alert and oriented to person, place and time    Laboratory Studies  Chemistry  Lab Units 12/23/22 1138 08/10/22 1555 06/14/22 1102 05/24/22 0720 05/23/22 1409 05/22/22 1049 05/04/22 1545 03/26/22 1335 12/29/21 1503 10/05/21 1531  SODIUM mmol/L 139 139 141  --   --  142 141 140 140 139  POTASSIUM mmol/L 5.4* 5.2 5.1  --   --  6.0* 5.2 5.1 5.4* 5.5*  CHLORIDE mmol/L 110 109 114*  --   --  114* 111* 110* 108 110  CO2 mmol/L 20 21 18*  --   --  20.3 19* 22.5 22 21   ANION GAP mmol/L  --   --   --   --   --  8  --   --   --   --   MAGNESIUM  mg/dL  --   --   --  1.8 1.9  --   --   --   --   --   CALCIUM  mg/dL 8.4* 8.5* 8.5*  --   --  9.2 8.6 8.6* 8.7 8.4*  PHOSPHORUS mg/dL 3.6 3.6 4.4*  --   --   --  5.4*  --  3.9 3.3  ALK PHOS U/L  --   --   --   --   --  86  --  91  --   --   PTH pg/mL 88* 90* 106*  --   --   --  113*  --  100* 88*  GLUCOSE mg/dL 749* 748* 775*  --   --  189* 152* 196* 232* 143*  ALBUMIN g/dL 3.5* 3.5* 3.6  --   --  3.7 3.9 3.7 3.9 3.8  BUN mg/dL 40* 33* 44*  --   --  40* 44* 44* 37* 34*  CREATININE mg/dL 7.32* 7.75* 7.47*  --   --  2.36* 2.88* 2.5* 2.68* 2.33*        No lab exists for component: IRON SATURATION, TRANSSATPER  CBC  Lab Units 12/23/22 1138 08/10/22 1555 06/14/22 1102 05/04/22 1545 12/29/21 1503 10/05/21 1531  WBC AUTO Thousand/uL 9.0 6.7 8.4 6.4 6.8 6.4  HEMOGLOBIN g/dL 88.0* 87.9* 88.0* 88.0* 11.8* 11.5*  HEMATOCRIT % 36.0* 35.9* 35.3* 35.5* 33.6* 33.1*  MCV fL 99.2 97.8 98.9 96.7 99.1 98.2  PLATELETS AUTO Thousand/uL 162 149 179 159 145 140    Urine  Lab Units 12/23/22 1138 08/10/22 1555 06/14/22 1102  PROT/CREAT RATIO UR mg/g creat 3.157*  3,157* 2.970*  2,970* 3.159*  3,159*    Lab Results  Component Value Date   PTH 88 (H) 12/23/2022   CALCIUM  8.4 (L) 12/23/2022   PHOS 3.6 12/23/2022     Imaging and Other Studies  December 11, 2018: Negative SPEP/ANCA/ANA/GBM  antibodies Renal ultrasound 12/20/2018: Possible right-sided hydronephrosis. CT scan abdomen pelvis 01/01/2019: Negative for hydronephrosis.  Cholelithiasis  noted.    Orders Placed This Encounter  . Renal Function Panel  . CBC and Differential  . PTH, Intact  . Protein, Total, Random Urine w/Creatinine (Protein/Creat Ratio)         Impression/Recommendations  Sisto Granillo is a 89 y.o. male with past medical history of diabetes mellitus type 2, chronic systolic heart failure, TIA, atrial fibrillation, hyperlipidemia who was referred for the evaluation management of chronic kidney disease stage III.   1.    Diabetes mellitus type 2 with chronic kidney disease/chronic kidney disease stage IV/proteinuria.  The patient's chronic kidney disease appears to be stable most recent EGFR of 23 with urine protein creatinine ratio 3.1 and hemoglobin A1c of 7.4.  We plan to maintain the patient on losartan  and follow-up renal parameters today.  2.  Hypertension.  Blood pressure under reasonable control at 120/60.  Patient to be maintained on losartan  for hypertension control.  3.  Anemia of chronic kidney disease.  Will plan to follow-up CBC today.  No indication for Procrit.  4.  Secondary hyperparathyroidism.  Follow-up PTH, phosphorus, calcium  level today.  Return in about 10 weeks (around 05/31/2023).   Munsoor Lateef, MD

## 2023-03-23 ENCOUNTER — Other Ambulatory Visit: Payer: Self-pay | Admitting: Family Medicine

## 2023-03-23 DIAGNOSIS — I4891 Unspecified atrial fibrillation: Secondary | ICD-10-CM | POA: Diagnosis not present

## 2023-03-23 DIAGNOSIS — E782 Mixed hyperlipidemia: Secondary | ICD-10-CM | POA: Diagnosis not present

## 2023-03-23 DIAGNOSIS — Z0000 Encounter for general adult medical examination without abnormal findings: Secondary | ICD-10-CM | POA: Diagnosis not present

## 2023-03-23 DIAGNOSIS — E119 Type 2 diabetes mellitus without complications: Secondary | ICD-10-CM | POA: Diagnosis not present

## 2023-03-23 DIAGNOSIS — F03B18 Unspecified dementia, moderate, with other behavioral disturbance: Secondary | ICD-10-CM | POA: Diagnosis not present

## 2023-03-23 DIAGNOSIS — K5900 Constipation, unspecified: Secondary | ICD-10-CM | POA: Diagnosis not present

## 2023-03-23 NOTE — Progress Notes (Addendum)
 New Patient Visit and Medicare Annual Wellness Visit  Subjective:   Cody Howard is a 89 y.o. Male who presents for an Annual Wellness Visit. Additional concerns addressed today include: HPI  - Here with his daughter [name]  Social Hx: Born architectural technologist, [place]  - Moved to [place] with his family - [place] to trade school after high school, worked as financial planner for car shops - Married for 63+ years, has 2 kids, son and daughter. Has great grand kids, 12   Current Medical Providers and Suppliers: [facility] Patient Care Team: Toche, Doretta Large, MD as PCP - General (Internal Medicine) Future Appointments     Date/Time Provider Department Center Visit Type   03/28/2023 2:30 PM Cherilyn Debby Quivers, MD [facility] RETURN VISIT   05/31/2023 2:00 PM Custovic, Annalee, DO [facility] [place] [facility] FOLLOW UP   06/22/2023 1:00 PM Toche, Doretta Large, MD [facility] [place] [facility] OFFICE VISIT      neprhology  Age-appropriate Screening Schedule: The list below includes current immunization status and future screening recommendations based on patient's age. Orders for these recommended tests are listed in the plan section. The patient has been provided with a written plan.  There is no immunization history on file for this patient.  Health Maintenance Topics with due status: Overdue     Topic Date Due   Serum Phosphorus Never done   Parathyroid Hormone Never done   Diabetes Education Never done   Pneumococcal Vaccine: 65+ Never done   Adult Tetanus (Td And Tdap) Never done   Shingrix Never done   RSV Immunization Pregnant or 60+ Never done   COVID-19 Vaccine Never done   Health Maintenance Topics with due status: Due Soon     Topic Date Due   Creatinine Level 03/27/2023   Potassium Level 03/27/2023   Hemoglobin A1C 03/27/2023   Health Maintenance Topics with due status: Not Due     Topic Last Completion Date   Lipid  Panel 03/26/2022   Annual Urine Albumin Creatinine Ratio 03/26/2022   Serum Bicarbonate 03/26/2022   Serum Calcium  03/26/2022   Eye Exam 07/13/2022   Monofilament Foot Exam 09/24/2022   Depression Screening 03/23/2023   Medicare Initial or AWV 03/23/2023   Health Maintenance Topics with due status: Completed     Topic Last Completion Date   Influenza Vaccine 11/25/2022   Health Maintenance Topics with due status: Aged Out     Topic Date Due   Hib Vaccines Aged Out   Hepatitis A Vaccines Aged Out   Meningococcal ACWY Vaccine Aged Out   HPV Vaccines Aged Out   Health Maintenance Topics with due status: Discontinued     Topic Date Due   Colonoscopy Discontinued    Depression Screen-PHQ2/9 completed today  PHQ-2 Over the past 2 weeks, how often have you been bothered by any of the following problems? Little interest or pleasure in doing things: Not at all Feeling down, depressed, or hopeless: Not at all Patient Health Questionnaire-2 Score: 0 PHQ-2 Over the last 2 weeks, how often have you been bothered by any of the following problems? Little interest or pleasure in doing things: Not at all Feeling down, depressed, or hopeless: Not at all Patient Health Questionnaire-2 Score: 0  PHQ-9 (if PHQ >=3)    PHQ-2 Interpretation Values between 0-3 are considered not significant for depression  PHQ-9 Interpretation and Treatment Recommendations:  0-4= None  5-9= Mild / Treatment: Support, educate to call  if worse; return in one month  10-14= Moderate / Treatment: Support, watchful waiting; Antidepressant or Psychotherapy  15-19= Moderately severe / Treatment: Antidepressant OR Psychotherapy  >= 20 = Major depression, severe / Antidepressant AND Psychotherapy  Patient Health Risk Assessment questionnaire (HRA <redacted file path>): (if patient completed in MyChart or added in flowsheet)    * No data to display          Functional Ability/Safety Screen: Was the  patient's timed Get Up and Go Test unsteady or longer than 30 sec? Yes  How to perform Timed Up and Go test (TUG): [email or web address]    Cognitive Assessment: Cognitive screen used: Clock drawing. Results abnormal Results: The patient does have evidence of cognitive or memory problems. This a previously documented finding.  Identification of Risk Factors: Risk factors include: increased fall risk and cognitive impairment  Patient Active Problem List  Diagnosis  . Personal history of other diseases of male genital organs  . Sebaceous cyst  . Type 2 diabetes mellitus without complications (CMS/HHS-HCC)  . Hyperlipidemia, mixed  . Cardiomyopathy (CMS/HHS-HCC)  . Functional dyspnea  . Atrial flutter, paroxysmal (CMS/HHS-HCC)  . CKD (chronic kidney disease) stage 3, GFR 30-59 ml/min (CMS/HHS-HCC)  . Chronic systolic CHF (congestive heart failure), NYHA class 3 (CMS/HHS-HCC)  . TIA (transient ischemic attack)  . Loss of memory  . Closed displaced midcervical fracture of left femur (CMS/HHS-HCC)  . Status post hip hemiarthroplasty  . Moderate mitral regurgitation  . Sleeping difficulty  . Difficulty walking  . Cardiac syncope     Outpatient Medications Prior to Visit  Medication Sig Dispense Refill  . amLODIPine  (NORVASC ) 10 MG tablet Take 10 mg by mouth once daily    . apixaban  (ELIQUIS ) 2.5 mg tablet take 1 tablet by mouth twice a day 180 tablet 3  . Bifidobacterium infantis (ALIGN) 4 mg capsule Take 1 capsule by mouth once daily    . calcium  citrate/vitamin D3 (CITRACAL + D MAXIMUM ORAL) Take 1 tablet by mouth once daily    . cholecalciferol , vitamin D3, (VITAMIN D3) 125 mcg (5,000 unit) tablet Take 5,000 Units by mouth once daily    . cyanocobalamin, vitamin B-12, 5,000 mcg Cap Take 1 capsule by mouth once daily    . ferrous sulfate  325 (65 FE) MG tablet Take 325 mg by mouth once daily    . glipiZIDE  (GLUCOTROL ) 5 MG tablet Take 5 mg by mouth 2  (two) times daily before meals    . losartan  (COZAAR ) 100 MG tablet Take 50 mg by mouth once daily    . memantine  (NAMENDA ) 5 MG tablet -Start Namenda  5 mg once daily for 1 week, then increase to 5 mg twice daily for memory loss. 60 tablet 2  . montelukast  (SINGULAIR ) 10 mg tablet Take 10 mg by mouth at bedtime    . sertraline  (ZOLOFT ) 25 MG tablet Take 25 mg by mouth once daily    . simvastatin  (ZOCOR ) 80 MG tablet Take 80 mg by mouth once daily.    . FUROsemide  (LASIX ) 20 MG tablet Take 20 mg by mouth once daily (Patient not taking: Reported on 03/23/2023)    . lancets 1 each by Other route once daily. (Patient not taking: Reported on 03/23/2023)    . methylcellulose (CITRUCEL ORAL) Take by mouth once daily (Patient not taking: Reported on 03/23/2023)    . SIMETHICONE  ORAL Take 1 tablet by mouth as needed (Patient not taking: Reported on 03/23/2023)     Facility-Administered Medications Prior to  Visit  Medication Dose Route Frequency Provider Last Rate Last Admin  . cyanocobalamin (VITAMIN B12) injection 1,000 mcg  1,000 mcg Intramuscular Weekly Mevelyn Arabia [place], [place]   1,000 mcg at 10/12/17 1108  . cyanocobalamin (VITAMIN B12) injection 1,000 mcg  1,000 mcg Intramuscular Weekly Mevelyn Arabia [place], [place]   1,000 mcg at 10/19/17 1056  . cyanocobalamin (VITAMIN B12) injection 1,000 mcg  1,000 mcg Intramuscular Weekly Mevelyn Arabia [place], [place]   1,000 mcg at 10/26/17 1101    Social History   Socioeconomic History  . Marital status: Married  Tobacco Use  . Smoking status: Former    Current packs/day: 0.00    Types: Cigarettes    Quit date: 11/14/1978    Years since quitting: 44.3  . Smokeless tobacco: Never  Vaping Use  . Vaping status: Never Used  Substance and Sexual Activity  . Drug use: Defer  . Sexual activity: Defer   Social Drivers of Health   Financial Resource Strain: Low Risk  (03/23/2023)   Overall Financial Resource Strain (CARDIA)   . Difficulty of Paying Living  Expenses: Not hard at all  Food Insecurity: No Food Insecurity (03/23/2023)   Hunger Vital Sign   . Worried About Programme Researcher, Broadcasting/film/video in the Last Year: Never true   . Ran Out of Food in the Last Year: Never true  Transportation Needs: No Transportation Needs (03/23/2023)   PRAPARE - Transportation   . Lack of Transportation (Medical): No   . Lack of Transportation (Non-Medical): No  Physical Activity: Insufficiently Active (02/20/2023)   [facility] from [facility]   Exercise Vital Sign   . Days of Exercise per Week: 1 day   . Minutes of Exercise per Session: 30 min  Stress: No Stress Concern Present (02/20/2023)   Received from [facility] - Occupational Stress Questionnaire   . Feeling of Stress : Not at all  Social Connections: Moderately Isolated (02/20/2023)   [facility] from [facility]   Social Connection and Isolation Panel [NHANES]   . Frequency of Communication with Friends and Family: Once a week   . Frequency of Social Gatherings with Friends and Family: More than three times a week   . Attends Religious Services: Never   . Active Member of Clubs or Organizations: No   . Marital Status: Married  Housing Stability: Low Risk  (03/23/2023)   Housing Stability Vital Sign   . Unable to Pay for Housing in the Last Year: No   . Number of Times Moved in the Last Year: 0   . Homeless in the Last Year: No     Family History  Problem Relation Age of Onset  . Diabetes type II Mother   . Diabetes type I Father   . Heart disease Father   . Lung cancer Brother      Past Medical History:  Diagnosis Date  . Cardiomyopathy, secondary (CMS/HHS-HCC)   . Colon polyp   . Diabetic acidosis, type II (CMS/HHS-HCC)   . High cholesterol   . Hyperplastic colon polyp 02/26/2015  . Tubular adenoma of colon 02/26/2015     Review of Systems  Objective:   Vitals:   03/23/23 1421  BP: 132/72  Pulse: 75  SpO2: 95%  Weight: 86.6 kg (191 lb)   Height: 180.3 cm (5' 11)  PainSc: 0-No pain   Body mass index is 26.64 kg/m. Home vitals:    Physical Exam GENERAL:  Pleasant well appearing elderly  male, alert, oriented to person and place, and in no acute distress. Daughter is present during visit HEENT:  NCAT EOMI CHEST:  Chest wall is within normal limits.   LUNGS: CTAB CARDIAC:  Regular rate and rhythm, normal S1 and S2 without murmurs, rubs or gallops.   VASCULAR:  radial pulses 2+ ABDOMEN:  Soft, with normal bowel sounds.  No organomegaly or tenderness found.    EXTREMITIES:  Full range of motion with no erythema, heat or effusion.  No cyanosis, clubbing or edema noted.  NEUROLOGIC:  The patient is alert and oriented.  Cranial nerves II-XII intact.  Motor and sensory examinations within normal limits. Unsteady gait, normally uses a walker    Assessment/Plan:    Patient Self-Management and Personalized Health Advice The patient has been provided with information about: fall prevention  During the course of the visit the patient was educated and counseled about appropriate screening and preventive services including:  Fall Risk-recommendations made to reduce risk  The patient's BMI is in the acceptable range  Diagnoses and all orders for this visit:  Dementia with behavioral disturbance (CMS/HHS-HCC)  Type 2 diabetes mellitus without complication, without long-term current use of insulin  (CMS/HHS-HCC)  Hyperlipidemia, mixed  Stage 3b chronic kidney disease (CMS/HHS-HCC)  Atrial flutter, paroxysmal (CMS/HHS-HCC)  Constipation, unspecified constipation type  Other orders -     polyethylene glycol (MIRALAX ) powder; Take 17 g by mouth once daily for 360 days Mix in 4-8ounces of fluid prior to taking. -     sennosides (SENOKOT) 8.6 mg tablet; Take 2 tablets by mouth 2 (two) times daily as needed for Constipation -     Follow up in Primary Care; Future   # Dementia with behavioral symptoms: Diagnosed 9 years ago,  has been on Memantine  for several years. Has occasional episodes of confusion, but usually redirectable.  Sleeps well at night, from 9pm to 8am. His mood is great, daughter said he is usually in a good mood. - Continue Memantine  5mg  BID - Continue Zoloft  25mg  daily  # Type 2 DM: Follows endocrine, next appt in 03/27/2022. Last A1C was 6 months ago was 7.4, due now - Currently seem to be doing well, continue current therapy - Will need to recheck A1C next visit, during endocrine visit - Continue Glipizide  5mg  BID  # HTN  HLD: BP wnl - Continue Losartan  100mg  daily and Amlodipine  10mg  daily, Simvastatin  80mg  daily  # Atrial fibrillation: On exam RRR, no issues on current regimen - Continue Eliquis  2.5mg  twice daily   Assessment: Moderate dementia is confirmed..  Plan: Current safety concerns: -  No safety issues at present -  It is important to periodically review medication management and involve the primary caregiver. Caregiver Support: -  Caregiver support is adequate Plan of Care: -  Dementia medications for cognitive symptoms were discussed, he is already on Memantine  -  The current plan for medication management is adequate. -  Regular exercise and social engagement were recommended.  For split visits please select--Optional Coding for video or office visit. Time or MDM for new OR established pts (Optional)--will disappear if none selected: 02-27-2020 E&M) This visit was coded based on time. I spent a total of 60 minutes in both face-to-face and non-face-to-face activities for this visit on the date of this encounter. This time did not include the time spent on the wellness exam.  Return in about 3 months (around 06/21/2023).  Future Appointments     Date/Time Provider Department Center Visit Type  03/28/2023 2:30 PM Cherilyn Debby Quivers, MD [facility] RETURN VISIT   05/31/2023 2:00 PM Custovic, Annalee, DO [facility] [place] [facility] FOLLOW UP    06/22/2023 1:00 PM Toche, Doretta Large, MD [facility] [place] [facility] OFFICE VISIT       An after visit summary was provided for the patient either in written format or through MyChart *Some images could not be shown.

## 2023-03-24 NOTE — Telephone Encounter (Signed)
Requested Prescriptions  Pending Prescriptions Disp Refills   glipiZIDE (GLUCOTROL) 5 MG tablet [Pharmacy Med Name: GLIPIZIDE 5MG  TABLET] 180 tablet 0    Sig: TAKE (1) TABLET BY MOUTH TWICE DAILY BEFORE MEALS     Endocrinology:  Diabetes - Sulfonylureas Failed - 03/24/2023  9:30 AM      Failed - HBA1C is between 0 and 7.9 and within 180 days    Hemoglobin A1C  Date Value Ref Range Status  09/24/2022 7.4  Final         Failed - Cr in normal range and within 360 days    Creatinine, Ser  Date Value Ref Range Status  01/14/2023 2.69 (H) 0.61 - 1.24 mg/dL Final         Passed - Valid encounter within last 6 months    Recent Outpatient Visits           3 weeks ago Pressure injury of left buttock, stage 1   [place] [employer] [facility]   1 month ago Cellulitis, scrotum   [place] [facility]   2 months ago Cellulitis, scrotum   [place] [facility]   3 months ago History of anemia   [place] [facility]   8 months ago Cellulitis of left upper extremity   [place] [facility], [facility]       Future Appointments             In 6 days Sondra Come, MD [facility] [place]   In 1 month Duanne Limerick, MD [facility] at [facility] [place], [facility]             amLODipine (NORVASC) 10 MG tablet [Pharmacy Med Name: AMLODIPINE BESYLATE 10MG  TABLET] 30 tablet 0    Sig: TAKE ONE TABLET BY MOUTH ONE TIME A DAY FOR HYPERTENSION     Cardiovascular: Calcium Channel Blockers 2 Passed - 03/24/2023  9:30 AM      Passed - Last BP in normal range    BP Readings from Last 1 Encounters:  02/28/23 124/69         Passed - Last Heart Rate in normal range    Pulse  Readings from Last 1 Encounters:  02/28/23 86         Passed - Valid encounter within last 6 months    Recent Outpatient Visits           3 weeks ago Pressure injury of left buttock, stage 1   [place] [facility]   1 month ago Cellulitis, scrotum   [place] [facility], MD   2 months ago Cellulitis, scrotum   [place] [facility], [facility]   3 months ago History of anemia   [facility]   8 months ago Cellulitis of left upper extremity   [place] [facility], MD       Future Appointments  In 6 days Sninsky, Laurette Schimke, MD [facility] [place]   In 1 month Duanne Limerick, MD [facility] Care & Sports Medicine at [facility], [facility]

## 2023-03-24 NOTE — Telephone Encounter (Signed)
Requested medication (s) are due for refill today: yes  Requested medication (s) are on the active medication list: no  Last refill:    Future visit scheduled: yes  Notes to clinic:  Unable to refill per protocol, last refill by another/historical provider. Need PCP to approve.     Requested Prescriptions  Pending Prescriptions Disp Refills   amLODipine (NORVASC) 10 MG tablet [Pharmacy Med Name: AMLODIPINE BESYLATE 10MG  TABLET] 30 tablet 0    Sig: TAKE ONE TABLET BY MOUTH ONE TIME A DAY FOR HYPERTENSION     Cardiovascular: Calcium Channel Blockers 2 Passed - 03/24/2023  9:31 AM      Passed - Last BP in normal range    BP Readings from Last 1 Encounters:  02/28/23 124/69         Passed - Last Heart Rate in normal range    Pulse Readings from Last 1 Encounters:  02/28/23 86         Passed - Valid encounter within last 6 months    Recent Outpatient Visits           3 weeks ago Pressure injury of left buttock, stage 1   [place] [facility]   1 month ago Cellulitis, scrotum   [place] [facility], [facility]   2 months ago Cellulitis, scrotum   [place] [facility], MD   3 months ago History of anemia   [place] [facility], MD   8 months ago Cellulitis of left upper extremity   [place] [facility], MD       Future Appointments             In 6 days Sondra Come, MD [facility] [place]   In 1 month Duanne Limerick, MD [facility] at [facility]            Signed Prescriptions Disp Refills   glipiZIDE (GLUCOTROL) 5 MG tablet 180 tablet 0    Sig: TAKE (1) TABLET BY MOUTH TWICE DAILY BEFORE MEALS     Endocrinology:  Diabetes -  Sulfonylureas Failed - 03/24/2023  9:31 AM      Failed - HBA1C is between 0 and 7.9 and within 180 days    Hemoglobin A1C  Date Value Ref Range Status  09/24/2022 7.4  Final         Failed - Cr in normal range and within 360 days    Creatinine, Ser  Date Value Ref Range Status  01/14/2023 2.69 (H) 0.61 - 1.24 mg/dL Final         Passed - Valid encounter within last 6 months    Recent Outpatient Visits           3 weeks ago Pressure injury of left buttock, stage 1   [place] [facility]   1 month ago Cellulitis, scrotum   [place] [facility]   2 months ago Cellulitis, scrotum    [facility] Medicine [facility], [facility]   3 months ago History of  anemia   [place] [facility]   8 months ago Cellulitis of left upper extremity   [place] [facility], [facility]       Future Appointments             In 6 days Richardo Hanks, Laurette Schimke, MD [facility] [place]   In 1 month Duanne Limerick, MD [facility] Primary Care & Sports Medicine at [facility]

## 2023-03-28 DIAGNOSIS — E1165 Type 2 diabetes mellitus with hyperglycemia: Secondary | ICD-10-CM | POA: Diagnosis not present

## 2023-03-28 DIAGNOSIS — I152 Hypertension secondary to endocrine disorders: Secondary | ICD-10-CM | POA: Diagnosis not present

## 2023-03-28 DIAGNOSIS — E1159 Type 2 diabetes mellitus with other circulatory complications: Secondary | ICD-10-CM | POA: Diagnosis not present

## 2023-03-28 DIAGNOSIS — E1169 Type 2 diabetes mellitus with other specified complication: Secondary | ICD-10-CM | POA: Diagnosis not present

## 2023-03-28 DIAGNOSIS — E785 Hyperlipidemia, unspecified: Secondary | ICD-10-CM | POA: Diagnosis not present

## 2023-03-30 ENCOUNTER — Ambulatory Visit: Payer: Medicare HMO | Admitting: Urology

## 2023-04-04 ENCOUNTER — Ambulatory Visit: Payer: Self-pay | Admitting: *Deleted

## 2023-04-04 NOTE — Patient Instructions (Signed)
Visit Information  Thank you for taking time to visit with me today. Please don't hesitate to contact me if I can be of assistance to you before our next scheduled telephone appointment.  Following are the goals we discussed today:  Follow low salt diet. Continue to monitor weights as much as possible. Wear compression stockings when able to for swelling.   Our next appointment is by telephone on 4/21  Please call the care guide team at [phone] if you need to cancel or reschedule your appointment.   Please call the Suicide and Crisis Lifeline: 988 call the [place] National Suicide Prevention Lifeline: 219-519-7533 or TTY: 807-569-2244 TTY (512)744-7192) to talk to a trained counselor call 1-800-273-TALK (toll free, 24 hour hotline) call 911 if you are experiencing a Mental Health or Behavioral Health Crisis or need someone to talk to.  Patient verbalizes understanding of instructions and care plan provided today and agrees to view in MyChart. Active MyChart status and patient understanding of how to access instructions and care plan via MyChart confirmed with patient.     The patient has been provided with contact information for the care management team and has been advised to call with any health related questions or concerns.   Rodney Langton, RN, MSN, CCM [facility], [facility] RN Care Coordinator Direct Dial: [phone] / Main [phone] Fax [phone] Email: [email or web address]: [email or web address]

## 2023-04-04 NOTE — Patient Outreach (Signed)
  Care Coordination   Follow Up Visit Note   04/04/2023 Name: Cody Howard MRN: [id number] DOB: 1934/12/17  ANTIONE OBAR is a 89 y.o. year old male who sees Cody Limerick, MD for primary care. I spoke with Cody Howard, daughter of Cody Howard by phone today.  What matters to the patients health and wellness today?  Daughter report patient is doing well, working on overall health maintenance, decreasing salt intake.     Goals Addressed             This Visit's Progress    Management of chronic medical conditions   On track    Interventions Today    Flowsheet Row Most Recent Value  Chronic Disease   Chronic disease during today's visit Atrial Fibrillation (AFib), Diabetes, Congestive Heart Failure (CHF), Other  [sacral wound]  General Interventions   General Interventions Discussed/Reviewed General Interventions Reviewed, Doctor Visits, Labs, Level of Care  Labs Hgb A1c every 3 months  [Recent A1C is 7]  Doctor Visits Discussed/Reviewed Doctor Visits Reviewed, PCP, Specialist  [Reviewed upcoming: wound tomorrow, urology 2/18, cardiology 3/25, nephrology 4/1, and PCP 4/16]  PCP/Specialist Visits Compliance with follow-up visit  [Completed new PCP, nephrology, and endocrinology since last outreach]  Level of Care Personal Care Services  [Has 24/7 caregivers in the home]  Exercise Interventions   Exercise Discussed/Reviewed Weight Managment  Weight Management Weight maintenance  [Reminded to try daily weight monitoring if he is stable and not at fall risk]  Education Interventions   Education Provided Provided Education  Provided Verbal Education On Labs, Blood Sugar Monitoring, Nutrition, Medication, When to see the doctor  [facility] reviewed, monitoring blood sugar daily, readings are stable]  Labs Reviewed Hgb A1c  [Educated on A1C being at goal of 7]  Nutrition Interventions   Nutrition Discussed/Reviewed Nutrition Reviewed, Decreasing salt, Decreasing sugar intake, Adding  fruits and vegetables  [Daughter report she has discussed low salt diet with caregiver, diet has been modified]              SDOH assessments and interventions completed:  No     Care Coordination Interventions:  Yes, provided   Follow up plan: Follow up call scheduled for 4/21    Encounter Outcome:  Patient Visit Completed   Rodney Langton, RN, MSN, CCM [place]  [facility], [facility] RN Care Coordinator Direct Dial: [phone] / Main [phone] Fax [phone] Email: [email or web address]: [email or web address]

## 2023-04-05 ENCOUNTER — Ambulatory Visit: Payer: Medicare HMO | Admitting: Physician Assistant

## 2023-04-20 ENCOUNTER — Other Ambulatory Visit: Payer: Self-pay

## 2023-04-20 DIAGNOSIS — N5082 Scrotal pain: Secondary | ICD-10-CM

## 2023-04-20 DIAGNOSIS — N3945 Continuous leakage: Secondary | ICD-10-CM

## 2023-04-21 ENCOUNTER — Other Ambulatory Visit: Payer: Self-pay | Admitting: Family Medicine

## 2023-04-21 NOTE — Telephone Encounter (Signed)
Will fill once until he gets firmly established with Dr. Rich Brave

## 2023-04-26 ENCOUNTER — Ambulatory Visit: Payer: Medicare HMO | Admitting: Urology

## 2023-04-26 ENCOUNTER — Encounter: Payer: Self-pay | Admitting: Urology

## 2023-04-26 ENCOUNTER — Other Ambulatory Visit
Admission: RE | Admit: 2023-04-26 | Discharge: 2023-04-26 | Disposition: A | Discharge status: Home / Self Care | Payer: Medicare HMO | Attending: Urology | Admitting: Urology

## 2023-04-26 VITALS — BP 121/67 | HR 85 | Ht 71.0 in | Wt 183.0 lb

## 2023-04-26 DIAGNOSIS — R32 Unspecified urinary incontinence: Secondary | ICD-10-CM | POA: Insufficient documentation

## 2023-04-26 LAB — BLADDER SCAN AMB NON-IMAGING: Scan Result: 28

## 2023-04-26 LAB — URINALYSIS, COMPLETE (UACMP) WITH MICROSCOPIC
Bilirubin Urine: NEGATIVE
Glucose, UA: 100 mg/dL — AB
Ketones, ur: NEGATIVE mg/dL
Leukocytes,Ua: NEGATIVE
Nitrite: NEGATIVE
Protein, ur: >300 mg/dL — AB
Specific Gravity, Urine: 1.02 (ref 1.005–1.030)
Squamous Epithelial / HPF: NONE SEEN /[HPF] (ref 0–5)
pH: 5.5 (ref 5.0–8.0)

## 2023-04-26 NOTE — Progress Notes (Addendum)
   04/26/2023 2:35 PM   Cody Howard 1934-07-01 [id number]  Reason for visit: Genital rash, urinary incontinence  HPI: Frail 89 year old male Dr. Lonna Cobb followed previously for BPH, and I saw previously in May 2024 for an episode of microscopic hematuria after a fall.  They deferred further evaluation as a repeat urinalysis was benign.  He was referred from Dr. Yetta Barre for urinary incontinence and rash.  Per the patient, he does not have any urinary leakage.  His caretaker says that he has some mild leakage with urgency if he does not make it to the bathroom in time.  There is also concerned about a possible scrotal rash, and they have been using nystatin cream for at least 2 to 3 months.  He denies any urinary complaints or scrotal pain.  PVR today normal at 28 mL.  On exam, his depends are dry.  There is some mild redness of the scrotum but no significant rash or irritation.    I recommended stopping the nystatin and trying an over-the-counter Goldbond type powder to promote dryness.  We discussed options like a condom catheter to divert the urine, but it does not sound like he is at that point yet.  I think with his age and frailty we need to have realistic expectations.  High fall risk with a condom catheter.  RTC urology 6 months symptom check    Sondra Come, MD  [facility] [address], [address] [place], [place] [address] [phone]

## 2023-05-03 ENCOUNTER — Ambulatory Visit: Payer: Self-pay | Admitting: Family Medicine

## 2023-05-16 ENCOUNTER — Encounter: Payer: Medicare HMO | Attending: Physician Assistant | Admitting: Physician Assistant

## 2023-05-16 DIAGNOSIS — L24A2 Irritant contact dermatitis due to fecal, urinary or dual incontinence: Secondary | ICD-10-CM | POA: Diagnosis not present

## 2023-05-16 DIAGNOSIS — N183 Chronic kidney disease, stage 3 unspecified: Secondary | ICD-10-CM | POA: Insufficient documentation

## 2023-05-16 DIAGNOSIS — M6281 Muscle weakness (generalized): Secondary | ICD-10-CM | POA: Diagnosis not present

## 2023-05-16 DIAGNOSIS — I480 Paroxysmal atrial fibrillation: Secondary | ICD-10-CM | POA: Insufficient documentation

## 2023-05-16 DIAGNOSIS — E11628 Type 2 diabetes mellitus with other skin complications: Secondary | ICD-10-CM | POA: Insufficient documentation

## 2023-05-16 DIAGNOSIS — E1122 Type 2 diabetes mellitus with diabetic chronic kidney disease: Secondary | ICD-10-CM | POA: Diagnosis not present

## 2023-05-16 DIAGNOSIS — Z7901 Long term (current) use of anticoagulants: Secondary | ICD-10-CM | POA: Insufficient documentation

## 2023-05-18 DIAGNOSIS — L570 Actinic keratosis: Secondary | ICD-10-CM | POA: Diagnosis not present

## 2023-05-18 DIAGNOSIS — D0462 Carcinoma in situ of skin of left upper limb, including shoulder: Secondary | ICD-10-CM | POA: Diagnosis not present

## 2023-05-18 DIAGNOSIS — Z85828 Personal history of other malignant neoplasm of skin: Secondary | ICD-10-CM | POA: Diagnosis not present

## 2023-05-18 DIAGNOSIS — L111 Transient acantholytic dermatosis [Grover]: Secondary | ICD-10-CM | POA: Diagnosis not present

## 2023-05-18 DIAGNOSIS — X32XXXA Exposure to sunlight, initial encounter: Secondary | ICD-10-CM | POA: Diagnosis not present

## 2023-05-18 DIAGNOSIS — Z08 Encounter for follow-up examination after completed treatment for malignant neoplasm: Secondary | ICD-10-CM | POA: Diagnosis not present

## 2023-05-18 DIAGNOSIS — D692 Other nonthrombocytopenic purpura: Secondary | ICD-10-CM | POA: Diagnosis not present

## 2023-05-18 DIAGNOSIS — Z7189 Other specified counseling: Secondary | ICD-10-CM | POA: Diagnosis not present

## 2023-05-18 DIAGNOSIS — L821 Other seborrheic keratosis: Secondary | ICD-10-CM | POA: Diagnosis not present

## 2023-05-18 DIAGNOSIS — D485 Neoplasm of uncertain behavior of skin: Secondary | ICD-10-CM | POA: Diagnosis not present

## 2023-05-19 ENCOUNTER — Other Ambulatory Visit: Payer: Self-pay | Admitting: Family Medicine

## 2023-05-30 DIAGNOSIS — N184 Chronic kidney disease, stage 4 (severe): Secondary | ICD-10-CM | POA: Diagnosis not present

## 2023-05-30 DIAGNOSIS — E1122 Type 2 diabetes mellitus with diabetic chronic kidney disease: Secondary | ICD-10-CM | POA: Diagnosis not present

## 2023-06-07 DIAGNOSIS — D631 Anemia in chronic kidney disease: Secondary | ICD-10-CM | POA: Diagnosis not present

## 2023-06-07 DIAGNOSIS — I10 Essential (primary) hypertension: Secondary | ICD-10-CM | POA: Diagnosis not present

## 2023-06-07 DIAGNOSIS — N184 Chronic kidney disease, stage 4 (severe): Secondary | ICD-10-CM | POA: Diagnosis not present

## 2023-06-07 DIAGNOSIS — E1122 Type 2 diabetes mellitus with diabetic chronic kidney disease: Secondary | ICD-10-CM | POA: Diagnosis not present

## 2023-06-07 DIAGNOSIS — N2581 Secondary hyperparathyroidism of renal origin: Secondary | ICD-10-CM | POA: Diagnosis not present

## 2023-06-08 ENCOUNTER — Telehealth: Payer: Self-pay | Admitting: *Deleted

## 2023-06-08 NOTE — Patient Outreach (Signed)
 Care Coordination   Follow Up Visit Note   06/08/2023 Name: Cody Howard MRN: [id number] DOB: 1934-03-14  Cody Howard is a 89 y.o. year old male who sees Carmelina Dane, MD for primary care. I spoke with daughter of Cody Howard by phone today.  What matters to the patients health and wellness today?  Daughter report patient is doing ok, napping more often.  Aware this is to be expected with progressing dementia.  Denies any urgent concerns, encouraged to contact this care manager with questions.      Goals Addressed             This Visit's Progress    Management of chronic medical conditions   On track    Interventions Today    Flowsheet Row Most Recent Value  Chronic Disease   Chronic disease during today's visit Atrial Fibrillation (AFib), Diabetes, Congestive Heart Failure (CHF), Other  [sacral wound]  General Interventions   General Interventions Discussed/Reviewed General Interventions Reviewed, Doctor Visits, Labs, Level of Care  Labs Hgb A1c every 3 months  [Recent A1C is 7]  Doctor Visits Discussed/Reviewed Doctor Visits Reviewed, PCP, Specialist  [Reviewed upcoming: wound tomorrow, urology 2/18, cardiology 3/25, nephrology 4/1, and PCP 4/16]  PCP/Specialist Visits Compliance with follow-up visit  [Completed new PCP, nephrology, and endocrinology since last outreach]  Level of Care Personal Care Services  [Has 24/7 caregivers in the home]  Exercise Interventions   Exercise Discussed/Reviewed Weight Managment  Weight Management Weight maintenance  [Reminded to try daily weight monitoring if he is stable and not at fall risk]  Education Interventions   Education Provided Provided Education  Provided Verbal Education On Labs, Blood Sugar Monitoring, Nutrition, Medication, When to see the doctor  [facility] reviewed, monitoring blood sugar daily, readings are stable]  Labs Reviewed Hgb A1c  [Educated on A1C being at goal of 7]  Nutrition Interventions   Nutrition  Discussed/Reviewed Nutrition Reviewed, Decreasing salt, Decreasing sugar intake, Adding fruits and vegetables  [Daughter report she has discussed low salt diet with caregiver, diet has been modified]              SDOH assessments and interventions completed:  No     Care Coordination Interventions:  Yes, provided   Follow up plan: No further intervention required.   Encounter Outcome:  Patient Visit Completed   Rodney Langton, RN, MSN, CCM [place]  [facility], [facility] RN Care Coordinator Direct Dial: [phone] / Main [phone] Fax [phone] Email: [email or web address]: [email or web address]

## 2023-06-10 DIAGNOSIS — L89151 Pressure ulcer of sacral region, stage 1: Secondary | ICD-10-CM | POA: Diagnosis not present

## 2023-06-10 DIAGNOSIS — N5089 Other specified disorders of the male genital organs: Secondary | ICD-10-CM | POA: Diagnosis not present

## 2023-06-24 ENCOUNTER — Other Ambulatory Visit: Payer: Self-pay | Admitting: Family Medicine

## 2023-06-27 ENCOUNTER — Encounter: Payer: Medicare HMO | Admitting: *Deleted

## 2023-06-28 DIAGNOSIS — I340 Nonrheumatic mitral (valve) insufficiency: Secondary | ICD-10-CM | POA: Diagnosis not present

## 2023-06-28 DIAGNOSIS — I420 Dilated cardiomyopathy: Secondary | ICD-10-CM | POA: Diagnosis not present

## 2023-06-28 DIAGNOSIS — I4892 Unspecified atrial flutter: Secondary | ICD-10-CM | POA: Diagnosis not present

## 2023-06-28 DIAGNOSIS — E782 Mixed hyperlipidemia: Secondary | ICD-10-CM | POA: Diagnosis not present

## 2023-06-29 DIAGNOSIS — L89151 Pressure ulcer of sacral region, stage 1: Secondary | ICD-10-CM | POA: Diagnosis not present

## 2023-06-29 DIAGNOSIS — B356 Tinea cruris: Secondary | ICD-10-CM | POA: Diagnosis not present

## 2023-06-30 DIAGNOSIS — D0462 Carcinoma in situ of skin of left upper limb, including shoulder: Secondary | ICD-10-CM | POA: Diagnosis not present

## 2023-06-30 DIAGNOSIS — L249 Irritant contact dermatitis, unspecified cause: Secondary | ICD-10-CM | POA: Diagnosis not present

## 2023-07-08 DIAGNOSIS — I4891 Unspecified atrial fibrillation: Secondary | ICD-10-CM | POA: Diagnosis not present

## 2023-07-08 DIAGNOSIS — R600 Localized edema: Secondary | ICD-10-CM | POA: Diagnosis not present

## 2023-07-08 DIAGNOSIS — E1122 Type 2 diabetes mellitus with diabetic chronic kidney disease: Secondary | ICD-10-CM | POA: Diagnosis not present

## 2023-08-02 DIAGNOSIS — I10 Essential (primary) hypertension: Secondary | ICD-10-CM | POA: Diagnosis not present

## 2023-08-02 DIAGNOSIS — N184 Chronic kidney disease, stage 4 (severe): Secondary | ICD-10-CM | POA: Diagnosis not present

## 2023-08-02 DIAGNOSIS — E1122 Type 2 diabetes mellitus with diabetic chronic kidney disease: Secondary | ICD-10-CM | POA: Diagnosis not present

## 2023-08-05 DIAGNOSIS — B356 Tinea cruris: Secondary | ICD-10-CM | POA: Diagnosis not present

## 2023-08-05 DIAGNOSIS — I872 Venous insufficiency (chronic) (peripheral): Secondary | ICD-10-CM | POA: Diagnosis not present

## 2023-08-09 DIAGNOSIS — E1122 Type 2 diabetes mellitus with diabetic chronic kidney disease: Secondary | ICD-10-CM | POA: Diagnosis not present

## 2023-08-09 DIAGNOSIS — I10 Essential (primary) hypertension: Secondary | ICD-10-CM | POA: Diagnosis not present

## 2023-08-09 DIAGNOSIS — D631 Anemia in chronic kidney disease: Secondary | ICD-10-CM | POA: Diagnosis not present

## 2023-08-09 DIAGNOSIS — N2581 Secondary hyperparathyroidism of renal origin: Secondary | ICD-10-CM | POA: Diagnosis not present

## 2023-08-09 DIAGNOSIS — R809 Proteinuria, unspecified: Secondary | ICD-10-CM | POA: Diagnosis not present

## 2023-08-09 DIAGNOSIS — N184 Chronic kidney disease, stage 4 (severe): Secondary | ICD-10-CM | POA: Diagnosis not present

## 2023-08-23 DIAGNOSIS — L89151 Pressure ulcer of sacral region, stage 1: Secondary | ICD-10-CM | POA: Diagnosis not present

## 2023-08-25 DIAGNOSIS — R2689 Other abnormalities of gait and mobility: Secondary | ICD-10-CM | POA: Diagnosis not present

## 2023-08-25 DIAGNOSIS — I951 Orthostatic hypotension: Secondary | ICD-10-CM | POA: Diagnosis not present

## 2023-08-25 DIAGNOSIS — R413 Other amnesia: Secondary | ICD-10-CM | POA: Diagnosis not present

## 2023-08-30 DIAGNOSIS — Z961 Presence of intraocular lens: Secondary | ICD-10-CM | POA: Diagnosis not present

## 2023-08-30 DIAGNOSIS — H40013 Open angle with borderline findings, low risk, bilateral: Secondary | ICD-10-CM | POA: Diagnosis not present

## 2023-08-30 DIAGNOSIS — H01001 Unspecified blepharitis right upper eyelid: Secondary | ICD-10-CM | POA: Diagnosis not present

## 2023-08-30 DIAGNOSIS — E119 Type 2 diabetes mellitus without complications: Secondary | ICD-10-CM | POA: Diagnosis not present

## 2023-08-30 DIAGNOSIS — I10 Essential (primary) hypertension: Secondary | ICD-10-CM | POA: Diagnosis not present

## 2023-08-30 DIAGNOSIS — H524 Presbyopia: Secondary | ICD-10-CM | POA: Diagnosis not present

## 2023-08-30 DIAGNOSIS — H35033 Hypertensive retinopathy, bilateral: Secondary | ICD-10-CM | POA: Diagnosis not present

## 2023-08-30 DIAGNOSIS — H01004 Unspecified blepharitis left upper eyelid: Secondary | ICD-10-CM | POA: Diagnosis not present

## 2023-08-30 DIAGNOSIS — B880 Other acariasis: Secondary | ICD-10-CM | POA: Diagnosis not present

## 2023-08-31 DIAGNOSIS — Z87891 Personal history of nicotine dependence: Secondary | ICD-10-CM | POA: Diagnosis not present

## 2023-08-31 DIAGNOSIS — H9193 Unspecified hearing loss, bilateral: Secondary | ICD-10-CM | POA: Diagnosis not present

## 2023-08-31 DIAGNOSIS — I951 Orthostatic hypotension: Secondary | ICD-10-CM | POA: Diagnosis not present

## 2023-08-31 DIAGNOSIS — I429 Cardiomyopathy, unspecified: Secondary | ICD-10-CM | POA: Diagnosis not present

## 2023-08-31 DIAGNOSIS — I130 Hypertensive heart and chronic kidney disease with heart failure and stage 1 through stage 4 chronic kidney disease, or unspecified chronic kidney disease: Secondary | ICD-10-CM | POA: Diagnosis not present

## 2023-08-31 DIAGNOSIS — I5022 Chronic systolic (congestive) heart failure: Secondary | ICD-10-CM | POA: Diagnosis not present

## 2023-08-31 DIAGNOSIS — M1990 Unspecified osteoarthritis, unspecified site: Secondary | ICD-10-CM | POA: Diagnosis not present

## 2023-08-31 DIAGNOSIS — Z96642 Presence of left artificial hip joint: Secondary | ICD-10-CM | POA: Diagnosis not present

## 2023-08-31 DIAGNOSIS — F03B0 Unspecified dementia, moderate, without behavioral disturbance, psychotic disturbance, mood disturbance, and anxiety: Secondary | ICD-10-CM | POA: Diagnosis not present

## 2023-08-31 DIAGNOSIS — E7800 Pure hypercholesterolemia, unspecified: Secondary | ICD-10-CM | POA: Diagnosis not present

## 2023-08-31 DIAGNOSIS — Z7901 Long term (current) use of anticoagulants: Secondary | ICD-10-CM | POA: Diagnosis not present

## 2023-08-31 DIAGNOSIS — I4891 Unspecified atrial fibrillation: Secondary | ICD-10-CM | POA: Diagnosis not present

## 2023-08-31 DIAGNOSIS — E1122 Type 2 diabetes mellitus with diabetic chronic kidney disease: Secondary | ICD-10-CM | POA: Diagnosis not present

## 2023-08-31 DIAGNOSIS — Z860101 Personal history of adenomatous and serrated colon polyps: Secondary | ICD-10-CM | POA: Diagnosis not present

## 2023-08-31 DIAGNOSIS — Z9181 History of falling: Secondary | ICD-10-CM | POA: Diagnosis not present

## 2023-08-31 DIAGNOSIS — N189 Chronic kidney disease, unspecified: Secondary | ICD-10-CM | POA: Diagnosis not present

## 2023-08-31 DIAGNOSIS — Z8673 Personal history of transient ischemic attack (TIA), and cerebral infarction without residual deficits: Secondary | ICD-10-CM | POA: Diagnosis not present

## 2023-08-31 DIAGNOSIS — K5900 Constipation, unspecified: Secondary | ICD-10-CM | POA: Diagnosis not present

## 2023-09-05 DIAGNOSIS — E7800 Pure hypercholesterolemia, unspecified: Secondary | ICD-10-CM | POA: Diagnosis not present

## 2023-09-05 DIAGNOSIS — Z96642 Presence of left artificial hip joint: Secondary | ICD-10-CM | POA: Diagnosis not present

## 2023-09-05 DIAGNOSIS — I4891 Unspecified atrial fibrillation: Secondary | ICD-10-CM | POA: Diagnosis not present

## 2023-09-05 DIAGNOSIS — I429 Cardiomyopathy, unspecified: Secondary | ICD-10-CM | POA: Diagnosis not present

## 2023-09-05 DIAGNOSIS — Z87891 Personal history of nicotine dependence: Secondary | ICD-10-CM | POA: Diagnosis not present

## 2023-09-05 DIAGNOSIS — M1990 Unspecified osteoarthritis, unspecified site: Secondary | ICD-10-CM | POA: Diagnosis not present

## 2023-09-05 DIAGNOSIS — I5022 Chronic systolic (congestive) heart failure: Secondary | ICD-10-CM | POA: Diagnosis not present

## 2023-09-05 DIAGNOSIS — E1122 Type 2 diabetes mellitus with diabetic chronic kidney disease: Secondary | ICD-10-CM | POA: Diagnosis not present

## 2023-09-05 DIAGNOSIS — F03B0 Unspecified dementia, moderate, without behavioral disturbance, psychotic disturbance, mood disturbance, and anxiety: Secondary | ICD-10-CM | POA: Diagnosis not present

## 2023-09-05 DIAGNOSIS — K5900 Constipation, unspecified: Secondary | ICD-10-CM | POA: Diagnosis not present

## 2023-09-05 DIAGNOSIS — Z9181 History of falling: Secondary | ICD-10-CM | POA: Diagnosis not present

## 2023-09-05 DIAGNOSIS — I951 Orthostatic hypotension: Secondary | ICD-10-CM | POA: Diagnosis not present

## 2023-09-05 DIAGNOSIS — Z7901 Long term (current) use of anticoagulants: Secondary | ICD-10-CM | POA: Diagnosis not present

## 2023-09-05 DIAGNOSIS — H9193 Unspecified hearing loss, bilateral: Secondary | ICD-10-CM | POA: Diagnosis not present

## 2023-09-05 DIAGNOSIS — N189 Chronic kidney disease, unspecified: Secondary | ICD-10-CM | POA: Diagnosis not present

## 2023-09-05 DIAGNOSIS — I130 Hypertensive heart and chronic kidney disease with heart failure and stage 1 through stage 4 chronic kidney disease, or unspecified chronic kidney disease: Secondary | ICD-10-CM | POA: Diagnosis not present

## 2023-09-05 DIAGNOSIS — Z8673 Personal history of transient ischemic attack (TIA), and cerebral infarction without residual deficits: Secondary | ICD-10-CM | POA: Diagnosis not present

## 2023-09-05 DIAGNOSIS — Z860101 Personal history of adenomatous and serrated colon polyps: Secondary | ICD-10-CM | POA: Diagnosis not present

## 2023-09-07 DIAGNOSIS — H9193 Unspecified hearing loss, bilateral: Secondary | ICD-10-CM | POA: Diagnosis not present

## 2023-09-07 DIAGNOSIS — Z7901 Long term (current) use of anticoagulants: Secondary | ICD-10-CM | POA: Diagnosis not present

## 2023-09-07 DIAGNOSIS — K5900 Constipation, unspecified: Secondary | ICD-10-CM | POA: Diagnosis not present

## 2023-09-07 DIAGNOSIS — N189 Chronic kidney disease, unspecified: Secondary | ICD-10-CM | POA: Diagnosis not present

## 2023-09-07 DIAGNOSIS — Z87891 Personal history of nicotine dependence: Secondary | ICD-10-CM | POA: Diagnosis not present

## 2023-09-07 DIAGNOSIS — I951 Orthostatic hypotension: Secondary | ICD-10-CM | POA: Diagnosis not present

## 2023-09-07 DIAGNOSIS — I429 Cardiomyopathy, unspecified: Secondary | ICD-10-CM | POA: Diagnosis not present

## 2023-09-07 DIAGNOSIS — Z96642 Presence of left artificial hip joint: Secondary | ICD-10-CM | POA: Diagnosis not present

## 2023-09-07 DIAGNOSIS — Z860101 Personal history of adenomatous and serrated colon polyps: Secondary | ICD-10-CM | POA: Diagnosis not present

## 2023-09-07 DIAGNOSIS — Z8673 Personal history of transient ischemic attack (TIA), and cerebral infarction without residual deficits: Secondary | ICD-10-CM | POA: Diagnosis not present

## 2023-09-07 DIAGNOSIS — I5022 Chronic systolic (congestive) heart failure: Secondary | ICD-10-CM | POA: Diagnosis not present

## 2023-09-07 DIAGNOSIS — I130 Hypertensive heart and chronic kidney disease with heart failure and stage 1 through stage 4 chronic kidney disease, or unspecified chronic kidney disease: Secondary | ICD-10-CM | POA: Diagnosis not present

## 2023-09-07 DIAGNOSIS — Z9181 History of falling: Secondary | ICD-10-CM | POA: Diagnosis not present

## 2023-09-07 DIAGNOSIS — I4891 Unspecified atrial fibrillation: Secondary | ICD-10-CM | POA: Diagnosis not present

## 2023-09-07 DIAGNOSIS — E1122 Type 2 diabetes mellitus with diabetic chronic kidney disease: Secondary | ICD-10-CM | POA: Diagnosis not present

## 2023-09-07 DIAGNOSIS — E7800 Pure hypercholesterolemia, unspecified: Secondary | ICD-10-CM | POA: Diagnosis not present

## 2023-09-07 DIAGNOSIS — F03B0 Unspecified dementia, moderate, without behavioral disturbance, psychotic disturbance, mood disturbance, and anxiety: Secondary | ICD-10-CM | POA: Diagnosis not present

## 2023-09-07 DIAGNOSIS — M1990 Unspecified osteoarthritis, unspecified site: Secondary | ICD-10-CM | POA: Diagnosis not present

## 2023-09-14 DIAGNOSIS — I429 Cardiomyopathy, unspecified: Secondary | ICD-10-CM | POA: Diagnosis not present

## 2023-09-14 DIAGNOSIS — Z87891 Personal history of nicotine dependence: Secondary | ICD-10-CM | POA: Diagnosis not present

## 2023-09-14 DIAGNOSIS — I4891 Unspecified atrial fibrillation: Secondary | ICD-10-CM | POA: Diagnosis not present

## 2023-09-14 DIAGNOSIS — G20A1 Parkinson's disease without dyskinesia, without mention of fluctuations: Secondary | ICD-10-CM | POA: Diagnosis not present

## 2023-09-14 DIAGNOSIS — K5900 Constipation, unspecified: Secondary | ICD-10-CM | POA: Diagnosis not present

## 2023-09-14 DIAGNOSIS — E11620 Type 2 diabetes mellitus with diabetic dermatitis: Secondary | ICD-10-CM | POA: Diagnosis not present

## 2023-09-14 DIAGNOSIS — E7800 Pure hypercholesterolemia, unspecified: Secondary | ICD-10-CM | POA: Diagnosis not present

## 2023-09-14 DIAGNOSIS — Z9181 History of falling: Secondary | ICD-10-CM | POA: Diagnosis not present

## 2023-09-14 DIAGNOSIS — I509 Heart failure, unspecified: Secondary | ICD-10-CM | POA: Diagnosis not present

## 2023-09-14 DIAGNOSIS — Z860101 Personal history of adenomatous and serrated colon polyps: Secondary | ICD-10-CM | POA: Diagnosis not present

## 2023-09-14 DIAGNOSIS — Z8673 Personal history of transient ischemic attack (TIA), and cerebral infarction without residual deficits: Secondary | ICD-10-CM | POA: Diagnosis not present

## 2023-09-14 DIAGNOSIS — N184 Chronic kidney disease, stage 4 (severe): Secondary | ICD-10-CM | POA: Diagnosis not present

## 2023-09-14 DIAGNOSIS — Z7901 Long term (current) use of anticoagulants: Secondary | ICD-10-CM | POA: Diagnosis not present

## 2023-09-14 DIAGNOSIS — N189 Chronic kidney disease, unspecified: Secondary | ICD-10-CM | POA: Diagnosis not present

## 2023-09-14 DIAGNOSIS — M1990 Unspecified osteoarthritis, unspecified site: Secondary | ICD-10-CM | POA: Diagnosis not present

## 2023-09-14 DIAGNOSIS — J449 Chronic obstructive pulmonary disease, unspecified: Secondary | ICD-10-CM | POA: Diagnosis not present

## 2023-09-14 DIAGNOSIS — G309 Alzheimer's disease, unspecified: Secondary | ICD-10-CM | POA: Diagnosis not present

## 2023-09-14 DIAGNOSIS — I130 Hypertensive heart and chronic kidney disease with heart failure and stage 1 through stage 4 chronic kidney disease, or unspecified chronic kidney disease: Secondary | ICD-10-CM | POA: Diagnosis not present

## 2023-09-14 DIAGNOSIS — E785 Hyperlipidemia, unspecified: Secondary | ICD-10-CM | POA: Diagnosis not present

## 2023-09-14 DIAGNOSIS — H9193 Unspecified hearing loss, bilateral: Secondary | ICD-10-CM | POA: Diagnosis not present

## 2023-09-14 DIAGNOSIS — Z96642 Presence of left artificial hip joint: Secondary | ICD-10-CM | POA: Diagnosis not present

## 2023-09-14 DIAGNOSIS — F03B0 Unspecified dementia, moderate, without behavioral disturbance, psychotic disturbance, mood disturbance, and anxiety: Secondary | ICD-10-CM | POA: Diagnosis not present

## 2023-09-14 DIAGNOSIS — I951 Orthostatic hypotension: Secondary | ICD-10-CM | POA: Diagnosis not present

## 2023-09-14 DIAGNOSIS — I5022 Chronic systolic (congestive) heart failure: Secondary | ICD-10-CM | POA: Diagnosis not present

## 2023-09-14 DIAGNOSIS — I4892 Unspecified atrial flutter: Secondary | ICD-10-CM | POA: Diagnosis not present

## 2023-09-14 DIAGNOSIS — E1122 Type 2 diabetes mellitus with diabetic chronic kidney disease: Secondary | ICD-10-CM | POA: Diagnosis not present

## 2023-09-16 DIAGNOSIS — E1122 Type 2 diabetes mellitus with diabetic chronic kidney disease: Secondary | ICD-10-CM | POA: Diagnosis not present

## 2023-09-16 DIAGNOSIS — H9193 Unspecified hearing loss, bilateral: Secondary | ICD-10-CM | POA: Diagnosis not present

## 2023-09-16 DIAGNOSIS — Z9181 History of falling: Secondary | ICD-10-CM | POA: Diagnosis not present

## 2023-09-16 DIAGNOSIS — I130 Hypertensive heart and chronic kidney disease with heart failure and stage 1 through stage 4 chronic kidney disease, or unspecified chronic kidney disease: Secondary | ICD-10-CM | POA: Diagnosis not present

## 2023-09-16 DIAGNOSIS — F03B0 Unspecified dementia, moderate, without behavioral disturbance, psychotic disturbance, mood disturbance, and anxiety: Secondary | ICD-10-CM | POA: Diagnosis not present

## 2023-09-16 DIAGNOSIS — Z7901 Long term (current) use of anticoagulants: Secondary | ICD-10-CM | POA: Diagnosis not present

## 2023-09-16 DIAGNOSIS — I951 Orthostatic hypotension: Secondary | ICD-10-CM | POA: Diagnosis not present

## 2023-09-16 DIAGNOSIS — Z96642 Presence of left artificial hip joint: Secondary | ICD-10-CM | POA: Diagnosis not present

## 2023-09-16 DIAGNOSIS — I4891 Unspecified atrial fibrillation: Secondary | ICD-10-CM | POA: Diagnosis not present

## 2023-09-16 DIAGNOSIS — Z860101 Personal history of adenomatous and serrated colon polyps: Secondary | ICD-10-CM | POA: Diagnosis not present

## 2023-09-16 DIAGNOSIS — N189 Chronic kidney disease, unspecified: Secondary | ICD-10-CM | POA: Diagnosis not present

## 2023-09-16 DIAGNOSIS — M1990 Unspecified osteoarthritis, unspecified site: Secondary | ICD-10-CM | POA: Diagnosis not present

## 2023-09-16 DIAGNOSIS — I5022 Chronic systolic (congestive) heart failure: Secondary | ICD-10-CM | POA: Diagnosis not present

## 2023-09-16 DIAGNOSIS — I429 Cardiomyopathy, unspecified: Secondary | ICD-10-CM | POA: Diagnosis not present

## 2023-09-16 DIAGNOSIS — E7800 Pure hypercholesterolemia, unspecified: Secondary | ICD-10-CM | POA: Diagnosis not present

## 2023-09-16 DIAGNOSIS — Z87891 Personal history of nicotine dependence: Secondary | ICD-10-CM | POA: Diagnosis not present

## 2023-09-16 DIAGNOSIS — K5900 Constipation, unspecified: Secondary | ICD-10-CM | POA: Diagnosis not present

## 2023-09-16 DIAGNOSIS — Z8673 Personal history of transient ischemic attack (TIA), and cerebral infarction without residual deficits: Secondary | ICD-10-CM | POA: Diagnosis not present

## 2023-09-19 DIAGNOSIS — Z860101 Personal history of adenomatous and serrated colon polyps: Secondary | ICD-10-CM | POA: Diagnosis not present

## 2023-09-19 DIAGNOSIS — H9193 Unspecified hearing loss, bilateral: Secondary | ICD-10-CM | POA: Diagnosis not present

## 2023-09-19 DIAGNOSIS — I429 Cardiomyopathy, unspecified: Secondary | ICD-10-CM | POA: Diagnosis not present

## 2023-09-19 DIAGNOSIS — E1122 Type 2 diabetes mellitus with diabetic chronic kidney disease: Secondary | ICD-10-CM | POA: Diagnosis not present

## 2023-09-19 DIAGNOSIS — I5022 Chronic systolic (congestive) heart failure: Secondary | ICD-10-CM | POA: Diagnosis not present

## 2023-09-19 DIAGNOSIS — N189 Chronic kidney disease, unspecified: Secondary | ICD-10-CM | POA: Diagnosis not present

## 2023-09-19 DIAGNOSIS — Z96642 Presence of left artificial hip joint: Secondary | ICD-10-CM | POA: Diagnosis not present

## 2023-09-19 DIAGNOSIS — I951 Orthostatic hypotension: Secondary | ICD-10-CM | POA: Diagnosis not present

## 2023-09-19 DIAGNOSIS — Z87891 Personal history of nicotine dependence: Secondary | ICD-10-CM | POA: Diagnosis not present

## 2023-09-19 DIAGNOSIS — F03B0 Unspecified dementia, moderate, without behavioral disturbance, psychotic disturbance, mood disturbance, and anxiety: Secondary | ICD-10-CM | POA: Diagnosis not present

## 2023-09-19 DIAGNOSIS — Z7901 Long term (current) use of anticoagulants: Secondary | ICD-10-CM | POA: Diagnosis not present

## 2023-09-19 DIAGNOSIS — I130 Hypertensive heart and chronic kidney disease with heart failure and stage 1 through stage 4 chronic kidney disease, or unspecified chronic kidney disease: Secondary | ICD-10-CM | POA: Diagnosis not present

## 2023-09-19 DIAGNOSIS — I4891 Unspecified atrial fibrillation: Secondary | ICD-10-CM | POA: Diagnosis not present

## 2023-09-19 DIAGNOSIS — M1990 Unspecified osteoarthritis, unspecified site: Secondary | ICD-10-CM | POA: Diagnosis not present

## 2023-09-19 DIAGNOSIS — Z8673 Personal history of transient ischemic attack (TIA), and cerebral infarction without residual deficits: Secondary | ICD-10-CM | POA: Diagnosis not present

## 2023-09-19 DIAGNOSIS — Z9181 History of falling: Secondary | ICD-10-CM | POA: Diagnosis not present

## 2023-09-19 DIAGNOSIS — E7800 Pure hypercholesterolemia, unspecified: Secondary | ICD-10-CM | POA: Diagnosis not present

## 2023-09-19 DIAGNOSIS — K5900 Constipation, unspecified: Secondary | ICD-10-CM | POA: Diagnosis not present

## 2023-09-27 DIAGNOSIS — I5022 Chronic systolic (congestive) heart failure: Secondary | ICD-10-CM | POA: Diagnosis not present

## 2023-09-27 DIAGNOSIS — Z87891 Personal history of nicotine dependence: Secondary | ICD-10-CM | POA: Diagnosis not present

## 2023-09-27 DIAGNOSIS — I4891 Unspecified atrial fibrillation: Secondary | ICD-10-CM | POA: Diagnosis not present

## 2023-09-27 DIAGNOSIS — I951 Orthostatic hypotension: Secondary | ICD-10-CM | POA: Diagnosis not present

## 2023-09-27 DIAGNOSIS — Z860101 Personal history of adenomatous and serrated colon polyps: Secondary | ICD-10-CM | POA: Diagnosis not present

## 2023-09-27 DIAGNOSIS — H9193 Unspecified hearing loss, bilateral: Secondary | ICD-10-CM | POA: Diagnosis not present

## 2023-09-27 DIAGNOSIS — I130 Hypertensive heart and chronic kidney disease with heart failure and stage 1 through stage 4 chronic kidney disease, or unspecified chronic kidney disease: Secondary | ICD-10-CM | POA: Diagnosis not present

## 2023-09-27 DIAGNOSIS — N189 Chronic kidney disease, unspecified: Secondary | ICD-10-CM | POA: Diagnosis not present

## 2023-09-27 DIAGNOSIS — I429 Cardiomyopathy, unspecified: Secondary | ICD-10-CM | POA: Diagnosis not present

## 2023-09-27 DIAGNOSIS — K5900 Constipation, unspecified: Secondary | ICD-10-CM | POA: Diagnosis not present

## 2023-09-27 DIAGNOSIS — Z8673 Personal history of transient ischemic attack (TIA), and cerebral infarction without residual deficits: Secondary | ICD-10-CM | POA: Diagnosis not present

## 2023-09-27 DIAGNOSIS — E1122 Type 2 diabetes mellitus with diabetic chronic kidney disease: Secondary | ICD-10-CM | POA: Diagnosis not present

## 2023-09-27 DIAGNOSIS — E7800 Pure hypercholesterolemia, unspecified: Secondary | ICD-10-CM | POA: Diagnosis not present

## 2023-09-27 DIAGNOSIS — Z96642 Presence of left artificial hip joint: Secondary | ICD-10-CM | POA: Diagnosis not present

## 2023-09-27 DIAGNOSIS — Z9181 History of falling: Secondary | ICD-10-CM | POA: Diagnosis not present

## 2023-09-27 DIAGNOSIS — F03B0 Unspecified dementia, moderate, without behavioral disturbance, psychotic disturbance, mood disturbance, and anxiety: Secondary | ICD-10-CM | POA: Diagnosis not present

## 2023-09-27 DIAGNOSIS — M1990 Unspecified osteoarthritis, unspecified site: Secondary | ICD-10-CM | POA: Diagnosis not present

## 2023-09-27 DIAGNOSIS — Z7901 Long term (current) use of anticoagulants: Secondary | ICD-10-CM | POA: Diagnosis not present

## 2023-09-28 DIAGNOSIS — Z9181 History of falling: Secondary | ICD-10-CM | POA: Diagnosis not present

## 2023-09-28 DIAGNOSIS — Z87891 Personal history of nicotine dependence: Secondary | ICD-10-CM | POA: Diagnosis not present

## 2023-09-28 DIAGNOSIS — N189 Chronic kidney disease, unspecified: Secondary | ICD-10-CM | POA: Diagnosis not present

## 2023-09-28 DIAGNOSIS — E7800 Pure hypercholesterolemia, unspecified: Secondary | ICD-10-CM | POA: Diagnosis not present

## 2023-09-28 DIAGNOSIS — I5022 Chronic systolic (congestive) heart failure: Secondary | ICD-10-CM | POA: Diagnosis not present

## 2023-09-28 DIAGNOSIS — Z860101 Personal history of adenomatous and serrated colon polyps: Secondary | ICD-10-CM | POA: Diagnosis not present

## 2023-09-28 DIAGNOSIS — Z7901 Long term (current) use of anticoagulants: Secondary | ICD-10-CM | POA: Diagnosis not present

## 2023-09-28 DIAGNOSIS — M1990 Unspecified osteoarthritis, unspecified site: Secondary | ICD-10-CM | POA: Diagnosis not present

## 2023-09-28 DIAGNOSIS — I429 Cardiomyopathy, unspecified: Secondary | ICD-10-CM | POA: Diagnosis not present

## 2023-09-28 DIAGNOSIS — K5900 Constipation, unspecified: Secondary | ICD-10-CM | POA: Diagnosis not present

## 2023-09-28 DIAGNOSIS — Z96642 Presence of left artificial hip joint: Secondary | ICD-10-CM | POA: Diagnosis not present

## 2023-09-28 DIAGNOSIS — H9193 Unspecified hearing loss, bilateral: Secondary | ICD-10-CM | POA: Diagnosis not present

## 2023-09-28 DIAGNOSIS — Z8673 Personal history of transient ischemic attack (TIA), and cerebral infarction without residual deficits: Secondary | ICD-10-CM | POA: Diagnosis not present

## 2023-09-28 DIAGNOSIS — I951 Orthostatic hypotension: Secondary | ICD-10-CM | POA: Diagnosis not present

## 2023-09-28 DIAGNOSIS — F03B0 Unspecified dementia, moderate, without behavioral disturbance, psychotic disturbance, mood disturbance, and anxiety: Secondary | ICD-10-CM | POA: Diagnosis not present

## 2023-09-28 DIAGNOSIS — I4891 Unspecified atrial fibrillation: Secondary | ICD-10-CM | POA: Diagnosis not present

## 2023-09-28 DIAGNOSIS — E1122 Type 2 diabetes mellitus with diabetic chronic kidney disease: Secondary | ICD-10-CM | POA: Diagnosis not present

## 2023-09-28 DIAGNOSIS — I130 Hypertensive heart and chronic kidney disease with heart failure and stage 1 through stage 4 chronic kidney disease, or unspecified chronic kidney disease: Secondary | ICD-10-CM | POA: Diagnosis not present

## 2023-10-03 DIAGNOSIS — K5900 Constipation, unspecified: Secondary | ICD-10-CM | POA: Diagnosis not present

## 2023-10-03 DIAGNOSIS — I951 Orthostatic hypotension: Secondary | ICD-10-CM | POA: Diagnosis not present

## 2023-10-03 DIAGNOSIS — E7800 Pure hypercholesterolemia, unspecified: Secondary | ICD-10-CM | POA: Diagnosis not present

## 2023-10-03 DIAGNOSIS — I4891 Unspecified atrial fibrillation: Secondary | ICD-10-CM | POA: Diagnosis not present

## 2023-10-03 DIAGNOSIS — H9193 Unspecified hearing loss, bilateral: Secondary | ICD-10-CM | POA: Diagnosis not present

## 2023-10-03 DIAGNOSIS — Z7901 Long term (current) use of anticoagulants: Secondary | ICD-10-CM | POA: Diagnosis not present

## 2023-10-03 DIAGNOSIS — Z860101 Personal history of adenomatous and serrated colon polyps: Secondary | ICD-10-CM | POA: Diagnosis not present

## 2023-10-03 DIAGNOSIS — F03B0 Unspecified dementia, moderate, without behavioral disturbance, psychotic disturbance, mood disturbance, and anxiety: Secondary | ICD-10-CM | POA: Diagnosis not present

## 2023-10-03 DIAGNOSIS — Z8673 Personal history of transient ischemic attack (TIA), and cerebral infarction without residual deficits: Secondary | ICD-10-CM | POA: Diagnosis not present

## 2023-10-03 DIAGNOSIS — N189 Chronic kidney disease, unspecified: Secondary | ICD-10-CM | POA: Diagnosis not present

## 2023-10-03 DIAGNOSIS — I5022 Chronic systolic (congestive) heart failure: Secondary | ICD-10-CM | POA: Diagnosis not present

## 2023-10-03 DIAGNOSIS — Z87891 Personal history of nicotine dependence: Secondary | ICD-10-CM | POA: Diagnosis not present

## 2023-10-03 DIAGNOSIS — Z96642 Presence of left artificial hip joint: Secondary | ICD-10-CM | POA: Diagnosis not present

## 2023-10-03 DIAGNOSIS — Z9181 History of falling: Secondary | ICD-10-CM | POA: Diagnosis not present

## 2023-10-03 DIAGNOSIS — I130 Hypertensive heart and chronic kidney disease with heart failure and stage 1 through stage 4 chronic kidney disease, or unspecified chronic kidney disease: Secondary | ICD-10-CM | POA: Diagnosis not present

## 2023-10-03 DIAGNOSIS — M1990 Unspecified osteoarthritis, unspecified site: Secondary | ICD-10-CM | POA: Diagnosis not present

## 2023-10-03 DIAGNOSIS — E1122 Type 2 diabetes mellitus with diabetic chronic kidney disease: Secondary | ICD-10-CM | POA: Diagnosis not present

## 2023-10-03 DIAGNOSIS — I429 Cardiomyopathy, unspecified: Secondary | ICD-10-CM | POA: Diagnosis not present

## 2023-10-05 DIAGNOSIS — K649 Unspecified hemorrhoids: Secondary | ICD-10-CM | POA: Diagnosis not present

## 2023-10-05 DIAGNOSIS — K625 Hemorrhage of anus and rectum: Secondary | ICD-10-CM | POA: Diagnosis not present

## 2023-10-06 DIAGNOSIS — R809 Proteinuria, unspecified: Secondary | ICD-10-CM | POA: Diagnosis not present

## 2023-10-06 DIAGNOSIS — Z96642 Presence of left artificial hip joint: Secondary | ICD-10-CM | POA: Diagnosis not present

## 2023-10-06 DIAGNOSIS — Z860101 Personal history of adenomatous and serrated colon polyps: Secondary | ICD-10-CM | POA: Diagnosis not present

## 2023-10-06 DIAGNOSIS — I429 Cardiomyopathy, unspecified: Secondary | ICD-10-CM | POA: Diagnosis not present

## 2023-10-06 DIAGNOSIS — Z87891 Personal history of nicotine dependence: Secondary | ICD-10-CM | POA: Diagnosis not present

## 2023-10-06 DIAGNOSIS — I10 Essential (primary) hypertension: Secondary | ICD-10-CM | POA: Diagnosis not present

## 2023-10-06 DIAGNOSIS — Z9181 History of falling: Secondary | ICD-10-CM | POA: Diagnosis not present

## 2023-10-06 DIAGNOSIS — N184 Chronic kidney disease, stage 4 (severe): Secondary | ICD-10-CM | POA: Diagnosis not present

## 2023-10-06 DIAGNOSIS — I951 Orthostatic hypotension: Secondary | ICD-10-CM | POA: Diagnosis not present

## 2023-10-06 DIAGNOSIS — E7800 Pure hypercholesterolemia, unspecified: Secondary | ICD-10-CM | POA: Diagnosis not present

## 2023-10-06 DIAGNOSIS — F03B0 Unspecified dementia, moderate, without behavioral disturbance, psychotic disturbance, mood disturbance, and anxiety: Secondary | ICD-10-CM | POA: Diagnosis not present

## 2023-10-06 DIAGNOSIS — N2581 Secondary hyperparathyroidism of renal origin: Secondary | ICD-10-CM | POA: Diagnosis not present

## 2023-10-06 DIAGNOSIS — Z8673 Personal history of transient ischemic attack (TIA), and cerebral infarction without residual deficits: Secondary | ICD-10-CM | POA: Diagnosis not present

## 2023-10-06 DIAGNOSIS — I5022 Chronic systolic (congestive) heart failure: Secondary | ICD-10-CM | POA: Diagnosis not present

## 2023-10-06 DIAGNOSIS — M1990 Unspecified osteoarthritis, unspecified site: Secondary | ICD-10-CM | POA: Diagnosis not present

## 2023-10-06 DIAGNOSIS — D631 Anemia in chronic kidney disease: Secondary | ICD-10-CM | POA: Diagnosis not present

## 2023-10-06 DIAGNOSIS — N189 Chronic kidney disease, unspecified: Secondary | ICD-10-CM | POA: Diagnosis not present

## 2023-10-06 DIAGNOSIS — I4891 Unspecified atrial fibrillation: Secondary | ICD-10-CM | POA: Diagnosis not present

## 2023-10-06 DIAGNOSIS — H9193 Unspecified hearing loss, bilateral: Secondary | ICD-10-CM | POA: Diagnosis not present

## 2023-10-06 DIAGNOSIS — K5900 Constipation, unspecified: Secondary | ICD-10-CM | POA: Diagnosis not present

## 2023-10-06 DIAGNOSIS — E1122 Type 2 diabetes mellitus with diabetic chronic kidney disease: Secondary | ICD-10-CM | POA: Diagnosis not present

## 2023-10-06 DIAGNOSIS — I130 Hypertensive heart and chronic kidney disease with heart failure and stage 1 through stage 4 chronic kidney disease, or unspecified chronic kidney disease: Secondary | ICD-10-CM | POA: Diagnosis not present

## 2023-10-06 DIAGNOSIS — Z7901 Long term (current) use of anticoagulants: Secondary | ICD-10-CM | POA: Diagnosis not present

## 2023-10-07 ENCOUNTER — Inpatient Hospital Stay
Admission: EM | Admit: 2023-10-07 | Discharge: 2023-10-09 | DRG: 683 | Disposition: A | Discharge status: Home / Self Care | Source: Ambulatory Visit | Attending: Obstetrics and Gynecology | Admitting: Obstetrics and Gynecology

## 2023-10-07 ENCOUNTER — Other Ambulatory Visit: Payer: Self-pay

## 2023-10-07 ENCOUNTER — Emergency Department

## 2023-10-07 DIAGNOSIS — E1165 Type 2 diabetes mellitus with hyperglycemia: Secondary | ICD-10-CM | POA: Diagnosis present

## 2023-10-07 DIAGNOSIS — I129 Hypertensive chronic kidney disease with stage 1 through stage 4 chronic kidney disease, or unspecified chronic kidney disease: Secondary | ICD-10-CM | POA: Diagnosis not present

## 2023-10-07 DIAGNOSIS — W19XXXA Unspecified fall, initial encounter: Secondary | ICD-10-CM | POA: Diagnosis not present

## 2023-10-07 DIAGNOSIS — Z7901 Long term (current) use of anticoagulants: Secondary | ICD-10-CM

## 2023-10-07 DIAGNOSIS — I480 Paroxysmal atrial fibrillation: Secondary | ICD-10-CM | POA: Diagnosis present

## 2023-10-07 DIAGNOSIS — E875 Hyperkalemia: Secondary | ICD-10-CM | POA: Diagnosis present

## 2023-10-07 DIAGNOSIS — I10 Essential (primary) hypertension: Secondary | ICD-10-CM | POA: Diagnosis present

## 2023-10-07 DIAGNOSIS — N2581 Secondary hyperparathyroidism of renal origin: Secondary | ICD-10-CM | POA: Diagnosis present

## 2023-10-07 DIAGNOSIS — R531 Weakness: Secondary | ICD-10-CM

## 2023-10-07 DIAGNOSIS — N179 Acute kidney failure, unspecified: Principal | ICD-10-CM | POA: Diagnosis present

## 2023-10-07 DIAGNOSIS — N184 Chronic kidney disease, stage 4 (severe): Principal | ICD-10-CM | POA: Diagnosis present

## 2023-10-07 DIAGNOSIS — E785 Hyperlipidemia, unspecified: Secondary | ICD-10-CM | POA: Diagnosis present

## 2023-10-07 DIAGNOSIS — Z794 Long term (current) use of insulin: Secondary | ICD-10-CM

## 2023-10-07 DIAGNOSIS — F32A Depression, unspecified: Secondary | ICD-10-CM | POA: Diagnosis present

## 2023-10-07 DIAGNOSIS — Z7984 Long term (current) use of oral hypoglycemic drugs: Secondary | ICD-10-CM

## 2023-10-07 DIAGNOSIS — Z66 Do not resuscitate: Secondary | ICD-10-CM | POA: Diagnosis present

## 2023-10-07 DIAGNOSIS — Z87891 Personal history of nicotine dependence: Secondary | ICD-10-CM

## 2023-10-07 DIAGNOSIS — I517 Cardiomegaly: Secondary | ICD-10-CM | POA: Diagnosis not present

## 2023-10-07 DIAGNOSIS — N400 Enlarged prostate without lower urinary tract symptoms: Secondary | ICD-10-CM | POA: Diagnosis present

## 2023-10-07 DIAGNOSIS — E1122 Type 2 diabetes mellitus with diabetic chronic kidney disease: Secondary | ICD-10-CM | POA: Diagnosis not present

## 2023-10-07 DIAGNOSIS — Z96642 Presence of left artificial hip joint: Secondary | ICD-10-CM | POA: Diagnosis present

## 2023-10-07 DIAGNOSIS — Z833 Family history of diabetes mellitus: Secondary | ICD-10-CM

## 2023-10-07 DIAGNOSIS — R739 Hyperglycemia, unspecified: Secondary | ICD-10-CM | POA: Diagnosis not present

## 2023-10-07 DIAGNOSIS — F0280 Dementia in other diseases classified elsewhere without behavioral disturbance: Secondary | ICD-10-CM | POA: Diagnosis present

## 2023-10-07 DIAGNOSIS — F0283 Dementia in other diseases classified elsewhere, unspecified severity, with mood disturbance: Secondary | ICD-10-CM | POA: Diagnosis present

## 2023-10-07 DIAGNOSIS — G309 Alzheimer's disease, unspecified: Secondary | ICD-10-CM | POA: Diagnosis present

## 2023-10-07 DIAGNOSIS — K625 Hemorrhage of anus and rectum: Secondary | ICD-10-CM | POA: Diagnosis present

## 2023-10-07 DIAGNOSIS — E871 Hypo-osmolality and hyponatremia: Secondary | ICD-10-CM | POA: Diagnosis present

## 2023-10-07 DIAGNOSIS — N189 Chronic kidney disease, unspecified: Secondary | ICD-10-CM | POA: Diagnosis not present

## 2023-10-07 DIAGNOSIS — D631 Anemia in chronic kidney disease: Secondary | ICD-10-CM | POA: Diagnosis present

## 2023-10-07 DIAGNOSIS — K649 Unspecified hemorrhoids: Secondary | ICD-10-CM | POA: Diagnosis present

## 2023-10-07 DIAGNOSIS — S0003XA Contusion of scalp, initial encounter: Secondary | ICD-10-CM | POA: Diagnosis not present

## 2023-10-07 DIAGNOSIS — Z8249 Family history of ischemic heart disease and other diseases of the circulatory system: Secondary | ICD-10-CM

## 2023-10-07 DIAGNOSIS — S41111A Laceration without foreign body of right upper arm, initial encounter: Secondary | ICD-10-CM | POA: Diagnosis not present

## 2023-10-07 DIAGNOSIS — Z79899 Other long term (current) drug therapy: Secondary | ICD-10-CM

## 2023-10-07 DIAGNOSIS — Y92239 Unspecified place in hospital as the place of occurrence of the external cause: Secondary | ICD-10-CM | POA: Diagnosis not present

## 2023-10-07 DIAGNOSIS — E8721 Acute metabolic acidosis: Secondary | ICD-10-CM | POA: Diagnosis present

## 2023-10-07 DIAGNOSIS — N289 Disorder of kidney and ureter, unspecified: Secondary | ICD-10-CM | POA: Diagnosis not present

## 2023-10-07 LAB — CBC WITH DIFFERENTIAL/PLATELET
Abs Immature Granulocytes: 0.08 K/uL — ABNORMAL HIGH (ref 0.00–0.07)
Basophils Absolute: 0 K/uL (ref 0.0–0.1)
Basophils Relative: 0 %
Eosinophils Absolute: 0.1 K/uL (ref 0.0–0.5)
Eosinophils Relative: 1 %
HCT: 33 % — ABNORMAL LOW (ref 39.0–52.0)
Hemoglobin: 11 g/dL — ABNORMAL LOW (ref 13.0–17.0)
Immature Granulocytes: 1 %
Lymphocytes Relative: 15 %
Lymphs Abs: 1.3 K/uL (ref 0.7–4.0)
MCH: 31.8 pg (ref 26.0–34.0)
MCHC: 33.3 g/dL (ref 30.0–36.0)
MCV: 95.4 fL (ref 80.0–100.0)
Monocytes Absolute: 0.4 K/uL (ref 0.1–1.0)
Monocytes Relative: 5 %
Neutro Abs: 7.2 K/uL (ref 1.7–7.7)
Neutrophils Relative %: 78 %
Platelets: 161 K/uL (ref 150–400)
RBC: 3.46 MIL/uL — ABNORMAL LOW (ref 4.22–5.81)
RDW: 13.8 % (ref 11.5–15.5)
WBC: 9.1 K/uL (ref 4.0–10.5)
nRBC: 0 % (ref 0.0–0.2)

## 2023-10-07 LAB — COMPREHENSIVE METABOLIC PANEL WITH GFR
ALT: 12 U/L (ref 0–44)
AST: 19 U/L (ref 15–41)
Albumin: 2.8 g/dL — ABNORMAL LOW (ref 3.5–5.0)
Alkaline Phosphatase: 87 U/L (ref 38–126)
Anion gap: 10 (ref 5–15)
BUN: 48 mg/dL — ABNORMAL HIGH (ref 8–23)
CO2: 19 mmol/L — ABNORMAL LOW (ref 22–32)
Calcium: 8.2 mg/dL — ABNORMAL LOW (ref 8.9–10.3)
Chloride: 104 mmol/L (ref 98–111)
Creatinine, Ser: 3.71 mg/dL — ABNORMAL HIGH (ref 0.61–1.24)
GFR, Estimated: 15 mL/min — ABNORMAL LOW (ref >60)
Glucose, Bld: 442 mg/dL — ABNORMAL HIGH (ref 70–99)
Potassium: 5.3 mmol/L — ABNORMAL HIGH (ref 3.5–5.1)
Sodium: 133 mmol/L — ABNORMAL LOW (ref 135–145)
Total Bilirubin: 0.5 mg/dL (ref 0.0–1.2)
Total Protein: 5.7 g/dL — ABNORMAL LOW (ref 6.5–8.1)

## 2023-10-07 LAB — URINALYSIS, ROUTINE W REFLEX MICROSCOPIC
Bacteria, UA: NONE SEEN
Bilirubin Urine: NEGATIVE
Glucose, UA: >=500 mg/dL — AB
Ketones, ur: NEGATIVE mg/dL
Leukocytes,Ua: NEGATIVE
Nitrite: NEGATIVE
Protein, ur: >=300 mg/dL — AB
Specific Gravity, Urine: 1.013 (ref 1.005–1.030)
pH: 5 (ref 5.0–8.0)

## 2023-10-07 LAB — TROPONIN I (HIGH SENSITIVITY): Troponin I (High Sensitivity): 21 ng/L — ABNORMAL HIGH (ref <18)

## 2023-10-07 LAB — BRAIN NATRIURETIC PEPTIDE: B Natriuretic Peptide: 40.3 pg/mL (ref 0.0–100.0)

## 2023-10-07 LAB — GLUCOSE, CAPILLARY: Glucose-Capillary: 356 mg/dL — ABNORMAL HIGH (ref 70–99)

## 2023-10-07 MED ORDER — MAGNESIUM HYDROXIDE 400 MG/5ML PO SUSP: 30.0000 mL | Freq: Every day | ORAL | Status: DC | PRN

## 2023-10-07 MED ORDER — FLUTICASONE PROPIONATE 50 MCG/ACT NA SUSP: 2.0000 | Freq: Every day | NASAL | Status: DC | PRN

## 2023-10-07 MED ORDER — VITAMIN D 25 MCG (1000 UNIT) PO TABS
5000.0000 [IU] | ORAL_TABLET | Freq: Every day | ORAL | Status: DC
  Administered 2023-10-08 – 2023-10-09 (×2): 5000 [IU] via ORAL
  Filled 2023-10-07 (×2): qty 5

## 2023-10-07 MED ORDER — MEMANTINE HCL 5 MG PO TABS
5.0000 mg | ORAL_TABLET | Freq: Two times a day (BID) | ORAL | Status: DC
  Administered 2023-10-07 – 2023-10-09 (×4): 5 mg via ORAL
  Filled 2023-10-07 (×4): qty 1

## 2023-10-07 MED ORDER — ACETAMINOPHEN 325 MG PO TABS: 650.0000 mg | ORAL_TABLET | Freq: Four times a day (QID) | ORAL | Status: DC | PRN

## 2023-10-07 MED ORDER — INSULIN ASPART 100 UNIT/ML IJ SOLN
6.0000 [IU] | Freq: Once | INTRAMUSCULAR | Status: AC
  Administered 2023-10-07: 6 [IU] via INTRAVENOUS
  Filled 2023-10-07: qty 1

## 2023-10-07 MED ORDER — SERTRALINE HCL 50 MG PO TABS
25.0000 mg | ORAL_TABLET | Freq: Every day | ORAL | Status: DC
  Administered 2023-10-08 – 2023-10-09 (×2): 25 mg via ORAL
  Filled 2023-10-07 (×2): qty 1

## 2023-10-07 MED ORDER — ONDANSETRON HCL 4 MG PO TABS: 4.0000 mg | ORAL_TABLET | Freq: Four times a day (QID) | ORAL | Status: DC | PRN

## 2023-10-07 MED ORDER — GLIPIZIDE 5 MG PO TABS
5.0000 mg | ORAL_TABLET | Freq: Two times a day (BID) | ORAL | Status: DC
  Filled 2023-10-07: qty 1

## 2023-10-07 MED ORDER — SODIUM CHLORIDE 0.9 % IV SOLN: INTRAVENOUS | Status: DC

## 2023-10-07 MED ORDER — TRAZODONE HCL 50 MG PO TABS
25.0000 mg | ORAL_TABLET | Freq: Every evening | ORAL | Status: DC | PRN
  Administered 2023-10-08: 25 mg via ORAL
  Filled 2023-10-07: qty 1

## 2023-10-07 MED ORDER — ONDANSETRON HCL 4 MG/2ML IJ SOLN: 4.0000 mg | Freq: Four times a day (QID) | INTRAMUSCULAR | Status: DC | PRN

## 2023-10-07 MED ORDER — INSULIN ASPART 100 UNIT/ML IJ SOLN
0.0000 [IU] | Freq: Three times a day (TID) | INTRAMUSCULAR | Status: DC
  Administered 2023-10-07: 20 [IU] via SUBCUTANEOUS
  Administered 2023-10-08: 4 [IU] via SUBCUTANEOUS
  Filled 2023-10-07 (×2): qty 1

## 2023-10-07 MED ORDER — APIXABAN 2.5 MG PO TABS
2.5000 mg | ORAL_TABLET | Freq: Two times a day (BID) | ORAL | Status: DC
  Administered 2023-10-07 – 2023-10-09 (×4): 2.5 mg via ORAL
  Filled 2023-10-07 (×5): qty 1

## 2023-10-07 MED ORDER — ENOXAPARIN SODIUM 30 MG/0.3ML IJ SOSY: 30.0000 mg | PREFILLED_SYRINGE | INTRAMUSCULAR | Status: DC

## 2023-10-07 MED ORDER — MONTELUKAST SODIUM 10 MG PO TABS
10.0000 mg | ORAL_TABLET | Freq: Every day | ORAL | Status: DC
Start: 2023-10-07 — End: 2023-10-09
  Administered 2023-10-07 – 2023-10-08 (×2): 10 mg via ORAL
  Filled 2023-10-07 (×2): qty 1

## 2023-10-07 MED ORDER — ACETAMINOPHEN 650 MG RE SUPP: 650.0000 mg | Freq: Four times a day (QID) | RECTAL | Status: DC | PRN

## 2023-10-07 MED ORDER — AMLODIPINE BESYLATE 10 MG PO TABS
10.0000 mg | ORAL_TABLET | Freq: Every day | ORAL | Status: DC
  Administered 2023-10-08 – 2023-10-09 (×2): 10 mg via ORAL
  Filled 2023-10-07 (×2): qty 1

## 2023-10-07 NOTE — H&P (Addendum)
 [place]   PATIENT NAME: Cody Howard    MR#:  [id number]  DATE OF BIRTH:  13-Aug-1934  DATE OF ADMISSION:  10/07/2023  PRIMARY CARE PHYSICIAN: Cody Amato, MD   Patient is coming from: Home  REQUESTING/REFERRING PHYSICIAN: Viviann Mungo, MD  CHIEF COMPLAINT:   Chief Complaint  Patient presents with   Weakness    HISTORY OF PRESENT ILLNESS:  Cody Howard is a 89 y.o. Caucasian male with medical history significant for paroxysmal atrial fibrillation, chronic kidney disease, type 2 diabetes mellitus, hypertension, dyslipidemia and osteoarthritis, who presented to the emergency room with acute onset of generalized weakness.  The patient was sent to the emergency room by Dr. Marcelino.  Today.  The patient's creatinine went up to 3.9 from his baseline of 3 and his blood glucose was 350.  The patient denies any nausea or vomiting or diarrhea or abdominal pain.  No fever or chills.  No dysuria, oliguria or hematuria or flank pain.  No pain or palpitations.  No cough or wheezing or dyspnea.  ED Course: When the patient came to the emergency room, vital signs were within normal.  Labs revealed mild hyponatremia and hyperkalemia, CO2 of 19.  BUN 48 with creatinine 3.71 up from 65/2.69 on 01/14/2023.  Blood glucose was 442.  And calcium  8.2.  Albumin was 2.8.  Total protein 5.7.  High-sensitivity troponin I was 21.  BNP was 40.3.  CBC showed mild anemia with hemoglobin 11 hematocrit 33.  Urinalysis showed more than 300 protein and more than 500 glucose. EKG as reviewed by me :  EKG showed normal sinus rhythm with a rate of 72. Imaging: 2 view chest x-ray showed no acute cardiopulmonary disease.  The patient was given 6 units of IV NovoLog  and was started on hydration with IV normal saline.  He will be admitted to a medical telemetry observation bed for further evaluation and management. PAST MEDICAL HISTORY:   Past Medical History:  Diagnosis Date   AF (atrial fibrillation)  (HCC)    Arthritis    hands   Chronic kidney disease    Diabetes mellitus without complication (HCC)    Hyperlipidemia    Hypertension    Prostate enlargement     PAST SURGICAL HISTORY:   Past Surgical History:  Procedure Laterality Date   COLONOSCOPY N/A 02/26/2015   Procedure: COLONOSCOPY;  Surgeon: Cody CINDERELLA Piedmont, MD;  Location: [facility];  Service: Gastroenterology;  Laterality: N/A;   COLONOSCOPY WITH PROPOFOL  N/A 11/02/2017   Procedure: COLONOSCOPY WITH PROPOFOL ;  Surgeon: Toledo, Ladell POUR, MD;  Location: [facility] END[facility]COPY;  Service: Gastroenterology;  Laterality: N/A;   ESOPHAGOGASTRODUODENOSCOPY N/A 02/26/2015   Procedure: ESOPHAGOGASTRODUODENOSCOPY (EGD);  Surgeon: Cody CINDERELLA Piedmont, MD;  Location: [facility];  Service: Gastroenterology;  Laterality: N/A;  Diabetic - oral meds   ESOPHAGOGASTRODUODENOSCOPY (EGD) WITH PROPOFOL  N/A 11/02/2017   Procedure: ESOPHAGOGASTRODUODENOSCOPY (EGD) WITH PROPOFOL ;  Surgeon: Toledo, Ladell POUR, MD;  Location: [facility] ENDOSCOPY;  Service: Gastroenterology;  Laterality: N/A;   HIP ARTHROPLASTY Left 06/28/2019   Procedure: ARTHROPLASTY BIPOLAR HIP (HEMIARTHROPLASTY);  Surgeon: Cody Norleen PARAS, MD;  Location: [facility] ORS;  Service: Orthopedics;  Laterality: Left;   KIDNEY SURGERY Right    surgery when 89 years old   POLYPECTOMY  02/26/2015   Procedure: POLYPECTOMY;  Surgeon: Cody CINDERELLA Piedmont, MD;  Location: [facility];  Service: Gastroenterology;;   sebaceous cyst removal Right    located right of spine on upper back  SOCIAL HISTORY:   Social History   Tobacco Use   Smoking status: Former    Current packs/day: 0.00    Types: Cigarettes    Quit date: 03/08/1978    Years since quitting: 45.6   Smokeless tobacco: Never  Substance Use Topics   Alcohol use: Not Currently    FAMILY HISTORY:   Family History  Problem Relation Age of Onset   Diabetes Father    Heart disease Father     DRUG ALLERGIES:  No Known Allergies  REVIEW OF  SYSTEMS:   ROS As per history of present illness. All pertinent systems were reviewed above. Constitutional, HEENT, cardiovascular, respiratory, GI, GU, musculoskeletal, neuro, psychiatric, endocrine, integumentary and hematologic systems were reviewed and are otherwise negative/unremarkable except for positive findings mentioned above in the HPI.   MEDICATIONS AT HOME:   Prior to Admission medications   Medication Sig Start Date End Date Taking? Authorizing Provider  amLODipine  (NORVASC ) 10 MG tablet TAKE ONE TABLET BY MOUTH ONE TIME A DAY FOR HYPERTENSION 05/20/23   Cody Cathryne BROCKS, MD  amLODipine  (NORVASC ) 5 MG tablet Take 1 tablet (5 mg total) by mouth daily. 01/15/23   Howard, Cody Sayres, MD  apixaban  (ELIQUIS ) 2.5 MG TABS tablet Take 2.5 mg by mouth 2 (two) times daily. 10/22/21   [provider]  Calcium  Citrate-Vitamin D (CITRACAL + D PO) Take 1 tablet by mouth daily.    [provider]  chlorhexidine  (HIBICLENS ) 4 % external liquid Apply topically daily as needed. 12/31/22   Cody Cathryne BROCKS, MD  Cholecalciferol  125 MCG (5000 UT) TABS Take 1 tablet (5,000 Units total) by mouth daily. 12/01/21   Cody Cathryne BROCKS, MD  D3 HIGH POTENCY 125 MCG (5000 UT) capsule TAKE ONE CAPSULE BY MOUTH ONE TIME A DAY FOR SUPPLEMENT 05/20/23   Cody Cathryne BROCKS, MD  doxycycline  (VIBRA -TABS) 100 MG tablet Take 1 tablet (100 mg total) by mouth 2 (two) times daily. 02/21/23   Cody Cathryne BROCKS, MD  ferrous sulfate  (FEROSUL) 325 (65 FE) MG tablet Take 1 tablet (325 mg total) by mouth daily with breakfast. 11/25/22   Cody Cathryne BROCKS, MD  fluticasone  (FLONASE  ALLERGY RELIEF) 50 MCG/ACT nasal spray Place into the nose. 01/24/23   [provider]  glipiZIDE  (GLUCOTROL ) 5 MG tablet TAKE (1) TABLET BY MOUTH TWICE DAILY BEFORE MEALS 03/24/23   Cody Cathryne BROCKS, MD  losartan  (COZAAR ) 50 MG tablet Take 50 mg by mouth daily.    [provider]  memantine (NAMENDA) 5 MG tablet Take 5 mg by mouth 2  (two) times daily.    [provider]  montelukast  (SINGULAIR ) 10 MG tablet Take 1 tablet (10 mg total) by mouth at bedtime. 11/25/22   Cody Cathryne BROCKS, MD  mupirocin  ointment (BACTROBAN ) 2 % Apply 1 Application topically 2 (two) times daily. 12/31/22   Cody Cathryne BROCKS, MD  nystatin  cream (MYCOSTATIN ) Apply 1 Application topically 2 (two) times daily. 02/21/23   Cody Cathryne BROCKS, MD  ondansetron  (ZOFRAN ) 4 MG tablet Take by mouth. 01/18/23   [provider]  Probiotic Product (ALIGN) 4 MG CAPS Take 1 capsule (4 mg total) by mouth at bedtime. 12/01/21   Cody Cathryne BROCKS, MD  sertraline  (ZOLOFT ) 25 MG tablet Take 1 tablet (25 mg total) by mouth daily. 11/25/22   Cody Cathryne BROCKS, MD  Zinc  Oxide 16 % OINT Apply topically. 20%    [provider]      VITAL SIGNS:  Blood pressure ROLLEN)  150/70, pulse 73, temperature 98.1 F (36.7 C), temperature source Oral, resp. rate 16, height 5' 10 (1.778 m), weight 81.6 kg, SpO2 97%.  PHYSICAL EXAMINATION:  Physical Exam  GENERAL:  89 y.o.-year-old Caucasian male patient lying in the bed with no acute distress.  EYES: Pupils equal, round, reactive to light and accommodation. No scleral icterus. Extraocular muscles intact.  HEENT: Head atraumatic, normocephalic. Oropharynx and nasopharynx clear.  NECK:  Supple, no jugular venous distention. No thyroid  enlargement, no tenderness.  LUNGS: Normal breath sounds bilaterally, no wheezing, rales,rhonchi or crepitation. No use of accessory muscles of respiration.  CARDIOVASCULAR: Regular rate and rhythm, S1, S2 normal. No murmurs, rubs, or gallops.  ABDOMEN: Soft, nondistended, nontender. Bowel sounds present. No organomegaly or mass.  EXTREMITIES: No pedal edema, cyanosis, or clubbing.  NEUROLOGIC: Cranial nerves II through XII are intact. Muscle strength 5/5 in all extremities. Sensation intact. Gait not checked.  PSYCHIATRIC: The patient is alert and oriented x 3.  Normal affect and good eye  contact. SKIN: No obvious rash, lesion, or ulcer.   LABORATORY PANEL:   CBC Recent Labs  Lab 10/07/23 1238  WBC 9.1  HGB 11.0*  HCT 33.0*  PLT 161   ------------------------------------------------------------------------------------------------------------------  Chemistries  Recent Labs  Lab 10/07/23 1238  NA 133*  K 5.3*  CL 104  CO2 19*  GLUCOSE 442*  BUN 48*  CREATININE 3.71*  CALCIUM  8.2*  AST 19  ALT 12  ALKPHOS 87  BILITOT 0.5   ------------------------------------------------------------------------------------------------------------------  Cardiac Enzymes No results for input(s): TROPONINI in the last 168 hours. ------------------------------------------------------------------------------------------------------------------  RADIOLOGY:  DG Chest 2 View Result Date: 10/07/2023 CLINICAL DATA:  Renal insufficiency and hyperglycemia. EXAM: CHEST - 2 VIEW COMPARISON:  06/27/2019 FINDINGS: The heart remains borderline enlarged. Clear lungs with normal vascularity. Mild upper thoracic spine degenerative changes and changes of DISH. IMPRESSION: No acute abnormality. Electronically Signed   By: Elspeth Bathe M.D.   On: 10/07/2023 17:06      IMPRESSION AND PLAN:  Assessment and Plan: * Acute kidney injury superimposed on chronic kidney disease (HCC) - This could be prerenal due to volume depletion and mild dehydration. - He will be admitted to a medical telemetry observation bed. - Will continue hydration with IV normal saline. - Will follow BMPs. - Will avoid nephrotoxins. - Nephrology consult will be obtained. - Dr. Douglas was notified about the patient.  Paroxysmal atrial fibrillation (HCC) - Will continue Eliquis .  Type 2 diabetes mellitus with chronic kidney disease, without long-term current use of insulin  (HCC) - The patient will be based on supplemental coverage with NovoLog . - Will continue glipizide .  Depression - Will continue  Zoloft .  Essential hypertension - Will continue antihypertensive therapy while holding off nephrotoxins..   DVT prophylaxis: Lovenox . Advanced Care Planning:  Code Status: The patient is DNR only. Family Communication:  The plan of care was discussed in details with the patient (and family). I answered all questions. The patient agreed to proceed with the above mentioned plan. Further management will depend upon hospital course. Disposition Plan: Back to previous home environment Consults called: Nephrology. All the records are reviewed and case discussed with ED provider.  Status is: Observation  I certify that at the time of admission, it is my clinical judgment that the patient will require hospital care extending less than 2 midnights.  Dispo: The patient is from: Home              Anticipated d/c is to: Home              Patient currently is not medically stable to d/c.              Difficult to place patient: No  [place] DELENA Peaches M.D on 10/07/2023 at 10:02 PM  [facility]   From 7 PM-7 AM, contact night-coverage [email or web address]  CC: Primary care physician; Cody Amato, MD

## 2023-10-07 NOTE — ED Provider Notes (Signed)
 [facility] Provider Note    Event Date/Time   First MD Initiated Contact with Patient 10/07/23 1611     (approximate)   History   Chief Complaint: Weakness   HPI  Cody Howard is a 89 y.o. male with a history of CKD, atrial fibrillation, hypertension diabetes, enlarged prostate, dementia who was sent to the ED by nephrology due to abnormal outpatient labs yesterday.  Patient has baseline creatinine of about 3.0.  Yesterday's labs showed a creatinine of 3.9, glucose of 530.  Patient denies any complaints, states he feels normal.  Family agrees that he is at baseline mental status but also reports that he has seemed to have lower energy and decreased mobility over the past month.  No notable trouble breathing, and patient has not reported chest pain or other pain complaints to them.  Normal oral intake and urine output.        Past Medical History:  Diagnosis Date   AF (atrial fibrillation) (HCC)    Arthritis    hands   Chronic kidney disease    Diabetes mellitus without complication (HCC)    Hyperlipidemia    Hypertension    Prostate enlargement     Current Outpatient Rx   Order #: [id number] Class: Normal   Order #: [id number] Class: No Print   Order #: [id number] Class: Historical Med   Order #: [id number] Class: Historical Med   Order #: [id number] Class: No Print   Order #: [id number] Class: Normal   Order #: [id number] Class: Normal   Order #: [id number] Class: Normal   Order #: [id number] Class: Normal   Order #: [id number] Class: Historical Med   Order #: [id number] Class: Normal   Order #: [id number] Class: Historical Med   Order #: [id number] Class: Historical Med   Order #: [id number] Class: Normal   Order #: [id number] Class: Normal   Order #: [id number] Class: Normal   Order #: [id number] Class: Historical Med   Order #: [id number] Class: Normal   Order #: [id number] Class: Normal   Order #: [id number] Class: Historical Med    Past Surgical History:   Procedure Laterality Date   COLONOSCOPY N/A 02/26/2015   Procedure: COLONOSCOPY;  Surgeon: Deward CINDERELLA Piedmont, MD;  Location: [facility];  Service: Gastroenterology;  Laterality: N/A;   COLONOSCOPY WITH PROPOFOL  N/A 11/02/2017   Procedure: COLONOSCOPY WITH PROPOFOL ;  Surgeon: Toledo, Ladell POUR, MD;  Location: [facility];  Service: Gastroenterology;  Laterality: N/A;   ESOPHAGOGASTRODUODENOSCOPY N/A 02/26/2015   Procedure: ESOPHAGOGASTRODUODENOSCOPY (EGD);  Surgeon: Deward CINDERELLA Piedmont, MD;  Location: [facility];  Service: Gastroenterology;  Laterality: N/A;  Diabetic - oral meds   ESOPHAGOGASTRODUODENOSCOPY (EGD) WITH PROPOFOL  N/A 11/02/2017   Procedure: ESOPHAGOGASTRODUODENOSCOPY (EGD) WITH PROPOFOL ;  Surgeon: Toledo, Ladell POUR, MD;  Location: [facility];  Service: Gastroenterology;  Laterality: N/A;   HIP ARTHROPLASTY Left 06/28/2019   Procedure: ARTHROPLASTY BIPOLAR HIP (HEMIARTHROPLASTY);  Surgeon: Edie Norleen PARAS, MD;  Location: [facility];  Service: Orthopedics;  Laterality: Left;   KIDNEY SURGERY Right    surgery when 89 years old   POLYPECTOMY  02/26/2015   Procedure: POLYPECTOMY;  Surgeon: Deward CINDERELLA Piedmont, MD;  Location: [facility];  Service: Gastroenterology;;   sebaceous cyst removal Right    located right of spine on upper back    Physical Exam   Triage Vital Signs: ED Triage Vitals  Encounter Vitals Group     BP 10/07/23 1236 137/67     Girls Systolic BP Percentile --      Girls Diastolic BP Percentile --  Boys Systolic BP Percentile --      Boys Diastolic BP Percentile --      Pulse Rate 10/07/23 1236 85     Resp 10/07/23 1236 18     Temp 10/07/23 1236 98.7 F (37.1 C)     Temp Source 10/07/23 1236 Oral     SpO2 10/07/23 1236 98 %     Weight 10/07/23 1235 180 lb (81.6 kg)     Height 10/07/23 1235 5' 10 (1.778 m)     Head Circumference --      Peak Flow --      Pain Score 10/07/23 1234 0     Pain Loc --      Pain Education --      Exclude from Growth  Chart --     Most recent vital signs: Vitals:   10/07/23 1830 10/07/23 1900  BP: (!) 154/64 (!) 149/71  Pulse: 74 75  Resp:  20  Temp:  98.2 F (36.8 C)  SpO2: 99% 100%    General: Awake, no distress.  CV:  Good peripheral perfusion.  Regular rate rhythm Resp:  Normal effort.  Clear to auscultation Abd:  No distention.  Soft nontender Other:  trace pitting edema bilateral lower extremities, symmetric. Groin exam with RN Crystal at bedside - intertrigo, classic candidiasis.   ED Results / Procedures / Treatments   Labs (all labs ordered are listed, but only abnormal results are displayed) Labs Reviewed  CBC WITH DIFFERENTIAL/PLATELET - Abnormal; Notable for the following components:      Result Value   RBC 3.46 (*)    Hemoglobin 11.0 (*)    HCT 33.0 (*)    Abs Immature Granulocytes 0.08 (*)    All other components within normal limits  URINALYSIS, ROUTINE W REFLEX MICROSCOPIC - Abnormal; Notable for the following components:   Color, Urine YELLOW (*)    APPearance CLEAR (*)    Glucose, UA >=500 (*)    Hgb urine dipstick SMALL (*)    Protein, ur >=300 (*)    All other components within normal limits  COMPREHENSIVE METABOLIC PANEL WITH GFR - Abnormal; Notable for the following components:   Sodium 133 (*)    Potassium 5.3 (*)    CO2 19 (*)    Glucose, Bld 442 (*)    BUN 48 (*)    Creatinine, Ser 3.71 (*)    Calcium  8.2 (*)    Total Protein 5.7 (*)    Albumin 2.8 (*)    GFR, Estimated 15 (*)    All other components within normal limits  TROPONIN I (HIGH SENSITIVITY) - Abnormal; Notable for the following components:   Troponin I (High Sensitivity) 21 (*)    All other components within normal limits  BRAIN NATRIURETIC PEPTIDE  CBC  CREATININE, SERUM  BASIC METABOLIC PANEL WITH GFR  CBC     EKG Interpreted by me Sinus rhythm rate of 81.  Normal axis and intervals.  Poor R wave progression.  Normal ST segments and T waves.  No acute ischemic changes.  EKG  unchanged compared to previous EKG on September 05, 2023.   RADIOLOGY Chest x-ray interpreted by me, unremarkable.  Radiology report reviewed   PROCEDURES:  Procedures   MEDICATIONS ORDERED IN ED: Medications  enoxaparin  (LOVENOX ) injection 30 mg (has no administration in time range)  0.9 %  sodium chloride  infusion (has no administration in time range)  acetaminophen  (TYLENOL ) tablet 650 mg (has no administration in time range)  Or  acetaminophen  (TYLENOL ) suppository 650 mg (has no administration in time range)  traZODone  (DESYREL ) tablet 25 mg (has no administration in time range)  magnesium  hydroxide (MILK OF MAGNESIA) suspension 30 mL (has no administration in time range)  ondansetron  (ZOFRAN ) tablet 4 mg (has no administration in time range)    Or  ondansetron  (ZOFRAN ) injection 4 mg (has no administration in time range)  insulin  aspart (novoLOG ) injection 6 Units (6 Units Intravenous Given 10/07/23 1815)     IMPRESSION / MDM / ASSESSMENT AND PLAN / ED COURSE  I reviewed the triage vital signs and the nursing notes.  DDx: Dehydration, AKI, electrolyte derangement, UTI, NSTEMI, heart failure, bladder obstruction  Patient's presentation is most consistent with acute presentation with potential threat to life or bodily function.  Patient comes to ED for evaluation due to abnormal labs from outpatient visit yesterday consisting of increase of creatinine from 3.0-3.9 as well as glucose level of 530.  Discussed with nephrology who recommends hospitalization due to degree of weakness that was observed in clinic.  Labs today are similar though marginally improved.  Will check bladder scan, EKG troponin BNP chest x-ray.  UA is without signs of UTI.   Clinical Course as of 10/07/23 1920  Fri Oct 07, 2023  1831 Case d/w hospitalist [PS]    Clinical Course User Index [PS] Viviann Pastor, MD     FINAL CLINICAL IMPRESSION(S) / ED DIAGNOSES   Final diagnoses:  Stage 4 chronic  kidney disease (HCC)  Generalized weakness  Type 2 diabetes mellitus with hyperglycemia, without long-term current use of insulin  (HCC)     Rx / DC Orders   ED Discharge Orders     None        Note:  This document was prepared using Dragon voice recognition software and may include unintentional dictation errors.   Viviann Pastor, MD 10/07/23 RENARD

## 2023-10-07 NOTE — Assessment & Plan Note (Signed)
-   The patient will be based on supplemental coverage with NovoLog . - Will continue glipizide .

## 2023-10-07 NOTE — Assessment & Plan Note (Signed)
Will continue Zoloft. 

## 2023-10-07 NOTE — Assessment & Plan Note (Signed)
Will continue Eliquis. 

## 2023-10-07 NOTE — Assessment & Plan Note (Signed)
-   Will continue antihypertensive therapy while holding off nephrotoxins.

## 2023-10-07 NOTE — ED Triage Notes (Signed)
 Son states patient was sent over by Nephrologist after having abnormal labs yesterday; GFR 14 and Glucose 540.  Patient states he feels normal and denies all symptoms.

## 2023-10-07 NOTE — Assessment & Plan Note (Signed)
-   This could be prerenal due to volume depletion and mild dehydration. - He will be admitted to a medical telemetry observation bed. - Will continue hydration with IV normal saline. - Will follow BMPs. - Will avoid nephrotoxins. - Nephrology consult will be obtained. - Dr. Douglas was notified about the patient.

## 2023-10-08 ENCOUNTER — Observation Stay

## 2023-10-08 DIAGNOSIS — Z66 Do not resuscitate: Secondary | ICD-10-CM | POA: Diagnosis not present

## 2023-10-08 DIAGNOSIS — N179 Acute kidney failure, unspecified: Secondary | ICD-10-CM | POA: Diagnosis not present

## 2023-10-08 DIAGNOSIS — S0003XA Contusion of scalp, initial encounter: Secondary | ICD-10-CM | POA: Diagnosis not present

## 2023-10-08 DIAGNOSIS — R531 Weakness: Secondary | ICD-10-CM | POA: Diagnosis not present

## 2023-10-08 DIAGNOSIS — D631 Anemia in chronic kidney disease: Secondary | ICD-10-CM | POA: Diagnosis not present

## 2023-10-08 DIAGNOSIS — N2581 Secondary hyperparathyroidism of renal origin: Secondary | ICD-10-CM | POA: Diagnosis not present

## 2023-10-08 DIAGNOSIS — E1122 Type 2 diabetes mellitus with diabetic chronic kidney disease: Secondary | ICD-10-CM | POA: Diagnosis not present

## 2023-10-08 DIAGNOSIS — I129 Hypertensive chronic kidney disease with stage 1 through stage 4 chronic kidney disease, or unspecified chronic kidney disease: Secondary | ICD-10-CM | POA: Diagnosis not present

## 2023-10-08 DIAGNOSIS — G309 Alzheimer's disease, unspecified: Secondary | ICD-10-CM | POA: Diagnosis not present

## 2023-10-08 DIAGNOSIS — W19XXXA Unspecified fall, initial encounter: Secondary | ICD-10-CM | POA: Diagnosis not present

## 2023-10-08 DIAGNOSIS — K625 Hemorrhage of anus and rectum: Secondary | ICD-10-CM | POA: Diagnosis not present

## 2023-10-08 DIAGNOSIS — I480 Paroxysmal atrial fibrillation: Secondary | ICD-10-CM | POA: Diagnosis not present

## 2023-10-08 DIAGNOSIS — N400 Enlarged prostate without lower urinary tract symptoms: Secondary | ICD-10-CM | POA: Diagnosis not present

## 2023-10-08 DIAGNOSIS — S41111A Laceration without foreign body of right upper arm, initial encounter: Secondary | ICD-10-CM | POA: Diagnosis not present

## 2023-10-08 DIAGNOSIS — R220 Localized swelling, mass and lump, head: Secondary | ICD-10-CM | POA: Diagnosis not present

## 2023-10-08 DIAGNOSIS — N184 Chronic kidney disease, stage 4 (severe): Secondary | ICD-10-CM | POA: Diagnosis not present

## 2023-10-08 DIAGNOSIS — S0990XA Unspecified injury of head, initial encounter: Secondary | ICD-10-CM | POA: Diagnosis not present

## 2023-10-08 DIAGNOSIS — E785 Hyperlipidemia, unspecified: Secondary | ICD-10-CM | POA: Diagnosis not present

## 2023-10-08 DIAGNOSIS — Z8249 Family history of ischemic heart disease and other diseases of the circulatory system: Secondary | ICD-10-CM | POA: Diagnosis not present

## 2023-10-08 DIAGNOSIS — Z794 Long term (current) use of insulin: Secondary | ICD-10-CM | POA: Diagnosis not present

## 2023-10-08 DIAGNOSIS — E875 Hyperkalemia: Secondary | ICD-10-CM | POA: Diagnosis not present

## 2023-10-08 DIAGNOSIS — Z7901 Long term (current) use of anticoagulants: Secondary | ICD-10-CM | POA: Diagnosis not present

## 2023-10-08 DIAGNOSIS — N189 Chronic kidney disease, unspecified: Secondary | ICD-10-CM | POA: Diagnosis not present

## 2023-10-08 DIAGNOSIS — Y92239 Unspecified place in hospital as the place of occurrence of the external cause: Secondary | ICD-10-CM | POA: Diagnosis not present

## 2023-10-08 DIAGNOSIS — F32A Depression, unspecified: Secondary | ICD-10-CM | POA: Diagnosis not present

## 2023-10-08 DIAGNOSIS — E8721 Acute metabolic acidosis: Secondary | ICD-10-CM | POA: Diagnosis not present

## 2023-10-08 DIAGNOSIS — E1165 Type 2 diabetes mellitus with hyperglycemia: Secondary | ICD-10-CM | POA: Diagnosis not present

## 2023-10-08 DIAGNOSIS — K649 Unspecified hemorrhoids: Secondary | ICD-10-CM | POA: Diagnosis not present

## 2023-10-08 DIAGNOSIS — F0283 Dementia in other diseases classified elsewhere, unspecified severity, with mood disturbance: Secondary | ICD-10-CM | POA: Diagnosis not present

## 2023-10-08 DIAGNOSIS — E871 Hypo-osmolality and hyponatremia: Secondary | ICD-10-CM | POA: Diagnosis not present

## 2023-10-08 LAB — CBC
HCT: 28.7 % — ABNORMAL LOW (ref 39.0–52.0)
Hemoglobin: 9.9 g/dL — ABNORMAL LOW (ref 13.0–17.0)
MCH: 32.5 pg (ref 26.0–34.0)
MCHC: 34.5 g/dL (ref 30.0–36.0)
MCV: 94.1 fL (ref 80.0–100.0)
Platelets: 137 K/uL — ABNORMAL LOW (ref 150–400)
RBC: 3.05 MIL/uL — ABNORMAL LOW (ref 4.22–5.81)
RDW: 13.6 % (ref 11.5–15.5)
WBC: 10.6 K/uL — ABNORMAL HIGH (ref 4.0–10.5)
nRBC: 0 % (ref 0.0–0.2)

## 2023-10-08 LAB — BASIC METABOLIC PANEL WITH GFR
Anion gap: 7 (ref 5–15)
BUN: 55 mg/dL — ABNORMAL HIGH (ref 8–23)
CO2: 18 mmol/L — ABNORMAL LOW (ref 22–32)
Calcium: 8.2 mg/dL — ABNORMAL LOW (ref 8.9–10.3)
Chloride: 110 mmol/L (ref 98–111)
Creatinine, Ser: 3.44 mg/dL — ABNORMAL HIGH (ref 0.61–1.24)
GFR, Estimated: 17 mL/min — ABNORMAL LOW (ref >60)
Glucose, Bld: 137 mg/dL — ABNORMAL HIGH (ref 70–99)
Potassium: 5 mmol/L (ref 3.5–5.1)
Sodium: 135 mmol/L (ref 135–145)

## 2023-10-08 LAB — GLUCOSE, CAPILLARY
Glucose-Capillary: 166 mg/dL — ABNORMAL HIGH (ref 70–99)
Glucose-Capillary: 258 mg/dL — ABNORMAL HIGH (ref 70–99)
Glucose-Capillary: 200 mg/dL — ABNORMAL HIGH (ref 70–99)
Glucose-Capillary: 248 mg/dL — ABNORMAL HIGH (ref 70–99)

## 2023-10-08 MED ORDER — INSULIN ASPART 100 UNIT/ML IJ SOLN
0.0000 [IU] | Freq: Three times a day (TID) | INTRAMUSCULAR | Status: DC
  Administered 2023-10-08: 3 [IU] via SUBCUTANEOUS
  Administered 2023-10-08: 8 [IU] via SUBCUTANEOUS
  Administered 2023-10-09: 5 [IU] via SUBCUTANEOUS
  Administered 2023-10-09: 11 [IU] via SUBCUTANEOUS
  Filled 2023-10-08 (×4): qty 1

## 2023-10-08 MED ORDER — STERILE WATER FOR INJECTION IV SOLN
INTRAVENOUS | Status: DC
  Filled 2023-10-08: qty 1000
  Filled 2023-10-08 (×2): qty 150

## 2023-10-08 MED ORDER — INSULIN ASPART 100 UNIT/ML IJ SOLN
0.0000 [IU] | Freq: Every day | INTRAMUSCULAR | Status: DC
  Administered 2023-10-08: 2 [IU] via SUBCUTANEOUS
  Filled 2023-10-08: qty 1

## 2023-10-08 NOTE — Inpatient Diabetes Management (Signed)
 Inpatient Diabetes Program Recommendations  AACE/ADA: New Consensus Statement on Inpatient Glycemic Control (2015)  Target Ranges:  Prepandial:   less than 140 mg/dL      Peak postprandial:   less than 180 mg/dL (1-2 hours)      Critically ill patients:  140 - 180 mg/dL   Lab Results  Component Value Date   GLUCAP 166 (H) 10/08/2023   HGBA1C 7.4 09/24/2022    Review of Glycemic Control  Diabetes history: DM2 Outpatient Diabetes medications: glipizide  5 mg BID Current orders for Inpatient glycemic control: Novolog  0-15 TID with meals and 0-5 HS  HgbA1C pending, previously 7.4% on 09/24/2022  CBGs today: 137, 166, 258 Post-prandials elevated  Son reports pt does not check blood sugars, although he's supposed to, per RN.  Inpatient Diabetes Program Recommendations:    Consider adding Novolog  2 units TID with meals if eating > 50%  Awaiting HgbA1C results.  Continue to follow.  Thank you. Shona Brandy, RD, LDN, CDCES Inpatient Diabetes Coordinator [phone]

## 2023-10-08 NOTE — Care Management Obs Status (Signed)
 MEDICARE OBSERVATION STATUS NOTIFICATION   Patient Details  Name: Cody Howard MRN: [id number] Date of Birth: 03-15-1934   Medicare Observation Status Notification Given:  Yes    [name] 10/08/2023, 12:49 PM

## 2023-10-08 NOTE — ED Notes (Signed)
 Pt transported to room 229 via stretcher by this nurse with monitoring equipment. Two family members present with patient during transport to room. Family members in room 229 with pt. Bed in lowest position with wheels locked. Side rails up x 2. Call light within reach and demonstration of how to use call light was provided to pt and family members at bedside. Everyone in room verbalized understanding. Family assisted with bedside table and setting up meal tray that was delivered to ED at bedside.

## 2023-10-08 NOTE — Progress Notes (Addendum)
 PROGRESS NOTE    Cody Howard  FMW:[id number] DOB: Dec 25, 1934 DOA: 10/07/2023 PCP: Salli Amato, MD  Outpatient Specialists: nephrology    Brief Narrative:   From admission h and p  Cody Howard is a 89 y.o. Caucasian male with medical history significant for paroxysmal atrial fibrillation, chronic kidney disease, type 2 diabetes mellitus, hypertension, dyslipidemia and osteoarthritis, who presented to the emergency room with acute onset of generalized weakness.  The patient was sent to the emergency room by Dr. Marcelino.  Today.  The patient's creatinine went up to 3.9 from his baseline of 3 and his blood glucose was 350.  The patient denies any nausea or vomiting or diarrhea or abdominal pain.  No fever or chills.  No dysuria, oliguria or hematuria or flank pain.  No pain or palpitations.  No cough or wheezing or dyspnea.    Assessment & Plan:   Principal Problem:   Acute kidney injury superimposed on chronic kidney disease (HCC) Active Problems:   Essential hypertension   Depression   Type 2 diabetes mellitus with chronic kidney disease, without long-term current use of insulin  (HCC)   Paroxysmal atrial fibrillation (HCC)   Alzheimer's disease (HCC)   CKD (chronic kidney disease) stage 4, GFR 15-29 ml/min (HCC)  # AKI on ckd 4 # Metabolic acidosis # Hyperkalemia Baseline cr mid-2s with gfr in the low 20s, here in the 3s with GFR in the teens. Normal PO and urination, no diarrhea, feeling well. Is on losartan . Glucose quite elevated on arrival, may have suffered from some osmotic diuresis leading to pre-renal azotemia. K has normalized to 5, was 5.3 on arrival. Remains acidotic - continue fluids but switch to bicarb - nephrology to see - renal u/s - hold losartan   # T2DM # Hyperglycemia Glucose 400s on arrival, was 500s on 7/31. Have now normalized - SSI, possible insulin  on d/c - dm educator  # Rectal bleeding Recent constipation, and small amount of BRB. Fam says  evaluated by pcp, told this is hemorrhoids. No sig hgb drop - monitor  # HTN Bp mild elevation - cont home amlodipine   # A-fib, paroxysmal Rate controlled - cont home apixaban   # Alzheimer's dementia Calm. Has 24/7 caregivers at home. Ambulates with a walker. Appears to be at his baseline  DVT prophylaxis: apixaban  Code Status: full Family Communication: son updated at bedside and wife telephonically 8/2  Level of care: Telemetry Medical Status is: Observation    Consultants:  nephrology  Procedures: none  Antimicrobials:  none    Subjective: Feeling fine, no complaints  Objective: Vitals:   10/07/23 2048 10/07/23 2200 10/08/23 0337 10/08/23 0841  BP: (!) 150/70  133/76 (!) 151/66  Pulse: 73  67 62  Resp: 16  16 18   Temp: 98.1 F (36.7 C)  97.9 F (36.6 C) 98.3 F (36.8 C)  TempSrc: Oral     SpO2: 97%  100% 98%  Weight:  82.8 kg    Height:  5' 9 (1.753 m)      Intake/Output Summary (Last 24 hours) at 10/08/2023 1009 Last data filed at 10/08/2023 0208 Gross per 24 hour  Intake 373.89 ml  Output 500 ml  Net -126.11 ml   Filed Weights   10/07/23 1235 10/07/23 2200  Weight: 81.6 kg 82.8 kg    Examination:  General exam: Appears calm and comfortable  Respiratory system: Clear to auscultation. Respiratory effort normal. Cardiovascular system: S1 & S2 heard, RRR. No JVD, murmurs, rubs, gallops or clicks. No  pedal edema. Gastrointestinal system: Abdomen is nondistended, soft and nontender. No organomegaly or masses felt. Normal bowel sounds heard. Central nervous system: Alert and oriented to self. Moving all 4 Extremities: Symmetric 5 x 5 power. Skin: No rashes, lesions or ulcers Psychiatry: pleasantly confused    Data Reviewed: I have personally reviewed following labs and imaging studies  CBC: Recent Labs  Lab 10/07/23 1238 10/08/23 0542  WBC 9.1 10.6*  NEUTROABS 7.2  --   HGB 11.0* 9.9*  HCT 33.0* 28.7*  MCV 95.4 94.1  PLT 161 137*    Basic Metabolic Panel: Recent Labs  Lab 10/07/23 1238 10/08/23 0542  NA 133* 135  K 5.3* 5.0  CL 104 110  CO2 19* 18*  GLUCOSE 442* 137*  BUN 48* 55*  CREATININE 3.71* 3.44*  CALCIUM  8.2* 8.2*   GFR: Estimated Creatinine Clearance: 15.4 mL/min (A) (by C-G formula based on SCr of 3.44 mg/dL (H)). Liver Function Tests: Recent Labs  Lab 10/07/23 1238  AST 19  ALT 12  ALKPHOS 87  BILITOT 0.5  PROT 5.7*  ALBUMIN 2.8*   No results for input(s): LIPASE, AMYLASE in the last 168 hours. No results for input(s): AMMONIA in the last 168 hours. Coagulation Profile: No results for input(s): INR, PROTIME in the last 168 hours. Cardiac Enzymes: No results for input(s): CKTOTAL, CKMB, CKMBINDEX, TROPONINI in the last 168 hours. BNP (last 3 results) No results for input(s): PROBNP in the last 8760 hours. HbA1C: No results for input(s): HGBA1C in the last 72 hours. CBG: Recent Labs  Lab 10/07/23 2315 10/08/23 0847  GLUCAP 356* 166*   Lipid Profile: No results for input(s): CHOL, HDL, LDLCALC, TRIG, CHOLHDL, LDLDIRECT in the last 72 hours. Thyroid  Function Tests: No results for input(s): TSH, T4TOTAL, FREET4, T3FREE, THYROIDAB in the last 72 hours. Anemia Panel: No results for input(s): VITAMINB12, FOLATE, FERRITIN, TIBC, IRON, RETICCTPCT in the last 72 hours. Urine analysis:    Component Value Date/Time   COLORURINE YELLOW (A) 10/07/2023 1238   APPEARANCEUR CLEAR (A) 10/07/2023 1238   APPEARANCEUR Cloudy (A) 08/13/2020 1330   LABSPEC 1.013 10/07/2023 1238   PHURINE 5.0 10/07/2023 1238   GLUCOSEU >=500 (A) 10/07/2023 1238   HGBUR SMALL (A) 10/07/2023 1238   BILIRUBINUR NEGATIVE 10/07/2023 1238   BILIRUBINUR Negative 08/13/2020 1330   KETONESUR NEGATIVE 10/07/2023 1238   PROTEINUR >=300 (A) 10/07/2023 1238   NITRITE NEGATIVE 10/07/2023 1238   LEUKOCYTESUR NEGATIVE 10/07/2023 1238   Sepsis  Labs: @LABRCNTIP (procalcitonin:4,lacticidven:4)  )No results found for this or any previous visit (from the past 240 hours).       Radiology Studies: DG Chest 2 View Result Date: 10/07/2023 CLINICAL DATA:  Renal insufficiency and hyperglycemia. EXAM: CHEST - 2 VIEW COMPARISON:  06/27/2019 FINDINGS: The heart remains borderline enlarged. Clear lungs with normal vascularity. Mild upper thoracic spine degenerative changes and changes of DISH. IMPRESSION: No acute abnormality. Electronically Signed   By: Elspeth Bathe M.D.   On: 10/07/2023 17:06        Scheduled Meds:  amLODipine   10 mg Oral Daily   apixaban   2.5 mg Oral BID   cholecalciferol   5,000 Units Oral Daily   insulin  aspart  0-20 Units Subcutaneous TID AC & HS   memantine   5 mg Oral BID   montelukast   10 mg Oral QHS   sertraline   25 mg Oral Daily   Continuous Infusions:  sodium bicarbonate  150 mEq in sterile water  1,150 mL infusion  LOS: 0 days     Devaughn KATHEE Ban, MD [facility]   If 7PM-7AM, please contact night-coverage [email or web address] Password [facility] 10/08/2023, 10:09 AM    '

## 2023-10-08 NOTE — Progress Notes (Signed)
 [facility]  ROUNDING NOTE   Subjective:   [name] was admitted to [facility] on 10/07/2023 for Generalized weakness [R53.1] Acute kidney injury superimposed on chronic kidney disease (HCC) [N17.9, N18.9] Type 2 diabetes mellitus with hyperglycemia, without long-term current use of insulin  (HCC) [E11.65] Stage 4 chronic kidney disease (HCC) [N18.4]  Patient was seen in our clinic on 7/31 by Dr. Marcelino. Found to have severe increase in creatinine 3.94 with hyperglycemia, hyponatremia and acute metabolic acidosis.   Son and family at bedside.   Patient started on IV fluids and immediately has improved clinical presentation according to family.    Objective:  Vital signs in last 24 hours:  Temp:  [97.9 F (36.6 C)-98.3 F (36.8 C)] 98.3 F (36.8 C) (08/02 0841) Pulse Rate:  [62-80] 62 (08/02 0841) Resp:  [16-20] 18 (08/02 0841) BP: (133-157)/(64-76) 151/66 (08/02 0841) SpO2:  [97 %-100 %] 98 % (08/02 0841) Weight:  [82.8 kg] 82.8 kg (08/01 2200)  Weight change:  Filed Weights   10/07/23 1235 10/07/23 2200  Weight: 81.6 kg 82.8 kg    Intake/Output: I/O last 3 completed shifts: In: 373.9 [I.V.:373.9] Out: 500 [Urine:500]   Intake/Output this shift:  Total I/O In: 180 [P.O.:180] Out: -   Physical Exam: General: NAD,   Head: Normocephalic, atraumatic. Moist oral mucosal membranes  Eyes: Anicteric, PERRL  Neck: Supple, trachea midline  Lungs:  Clear to auscultation  Heart: Regular rate and rhythm  Abdomen:  Soft, nontender,   Extremities:  no peripheral edema.  Neurologic: Alert to self.   Skin: No lesions  Access: none    Basic Metabolic Panel: Recent Labs  Lab 10/07/23 1238 10/08/23 0542  NA 133* 135  K 5.3* 5.0  CL 104 110  CO2 19* 18*  GLUCOSE 442* 137*  BUN 48* 55*  CREATININE 3.71* 3.44*  CALCIUM  8.2* 8.2*    Liver Function Tests: Recent Labs  Lab 10/07/23 1238  AST 19  ALT 12  ALKPHOS 87  BILITOT 0.5  PROT 5.7*   ALBUMIN 2.8*   No results for input(s): LIPASE, AMYLASE in the last 168 hours. No results for input(s): AMMONIA in the last 168 hours.  CBC: Recent Labs  Lab 10/07/23 1238 10/08/23 0542  WBC 9.1 10.6*  NEUTROABS 7.2  --   HGB 11.0* 9.9*  HCT 33.0* 28.7*  MCV 95.4 94.1  PLT 161 137*    Cardiac Enzymes: No results for input(s): CKTOTAL, CKMB, CKMBINDEX, TROPONINI in the last 168 hours.  BNP: Invalid input(s): POCBNP  CBG: Recent Labs  Lab 10/07/23 2315 10/08/23 0847 10/08/23 1159  GLUCAP 356* 166* 258*    Microbiology: Results for orders placed or performed in visit on 08/13/20  Microscopic Examination     Status: Abnormal   Collection Time: 08/13/20  1:30 PM   Urine  Result Value Ref Range Status   WBC, UA 0-5 0 - 5 /hpf Final   RBC, Urine 0-2 0 - 2 /hpf Final   Epithelial Cells (non renal) None seen 0 - 10 /hpf Final   Casts Present (A) None seen /lpf Final   Cast Type Hyaline casts N/A Final   Mucus, UA Present (A) Not Estab. Final   Bacteria, UA None seen None seen/Few Final    Coagulation Studies: No results for input(s): LABPROT, INR in the last 72 hours.  Urinalysis: Recent Labs    10/07/23 1238  COLORURINE YELLOW*  LABSPEC 1.013  PHURINE 5.0  GLUCOSEU >=500*  HGBUR SMALL*  BILIRUBINUR NEGATIVE  KETONESUR NEGATIVE  PROTEINUR >=300*  NITRITE NEGATIVE  LEUKOCYTESUR NEGATIVE      Imaging: US  RENAL Result Date: 10/08/2023 EXAM: RETROPERITONEAL ULTRASOUND OF THE KIDNEYS AND URINARY BLADDER TECHNIQUE: Real-time ultrasonography of the retroperitoneum including the kidneys and urinary bladder was performed. COMPARISON: Renal sonogram dated 01/09/2023. CLINICAL HISTORY: 409830 AKI (acute kidney injury) (HCC) 409830. AKI (acute kidney injury) (HCC) FINDINGS: RIGHT KIDNEY: The right kidney measures 11.4 x 5.8 x 5.8 cm, with an estimated volume of 200 ml. The right kidney demonstrates mild echogenicity. There is mild right  hydronephrosis. LEFT KIDNEY: The left kidney measures 9.9 x 4.9 x 4.3 cm, with an estimated volume of 109 ml. The left kidney demonstrates mild echogenicity. No hydronephrosis or intrarenal stones. BLADDER: Unremarkable appearance of the bladder. IMPRESSION: 1. Mild right hydronephrosis. 2. Mildly echogenic kidneys bilaterally. Electronically signed by: Cody Howard 10/08/2023 12:47 PM EDT RP Workstation: [id number]   DG Chest 2 View Result Date: 10/07/2023 CLINICAL DATA:  Renal insufficiency and hyperglycemia. EXAM: CHEST - 2 VIEW COMPARISON:  06/27/2019 FINDINGS: The heart remains borderline enlarged. Clear lungs with normal vascularity. Mild upper thoracic spine degenerative changes and changes of DISH. IMPRESSION: No acute abnormality. Electronically Signed   By: Cody Howard M.D.   On: 10/07/2023 17:06     Medications:    sodium bicarbonate  150 mEq in sterile water  1,150 mL infusion 100 mL/hr at 10/08/23 1224    amLODipine   10 mg Oral Daily   apixaban   2.5 mg Oral BID   cholecalciferol   5,000 Units Oral Daily   insulin  aspart  0-15 Units Subcutaneous TID WC   insulin  aspart  0-5 Units Subcutaneous QHS   memantine   5 mg Oral BID   montelukast   10 mg Oral QHS   sertraline   25 mg Oral Daily   acetaminophen  **OR** acetaminophen , fluticasone , magnesium  hydroxide, ondansetron  **OR** ondansetron  (ZOFRAN ) IV, traZODone   Assessment/ Plan:  [name] is a 89 y.o.  male with dementia, hypertension, diabetes mellitus type II diet controlled, atrial fibrillation, hyperlipidemia, and congestive heart failure who is admitted to [facility] on 10/07/2023 for Generalized weakness [R53.1] Acute kidney injury superimposed on chronic kidney disease (HCC) [N17.9, N18.9] Type 2 diabetes mellitus with hyperglycemia, without long-term current use of insulin  (HCC) [E11.65] Stage 4 chronic kidney disease (HCC) [N18.4]  Acute Kidney Injury on chronic kidney disease stage IV with proteinuria. Concern for  poor PO intake leading to prerenal azotemia. Improved renal function and urine output with IVF. No IV contrast exposure.  - losartan  held - Continue sodium bicarbonate  infusion - pending renal ultrasound for obstruction - no acute indication for dialysis.   Diabetes mellitus type II with chronic kidney disease: hemoglobin A1c of 7% on 03/28/23. Follows with endocrinology as outpatient.  - not currently on an SGLT-2 inhibitor.   Hypertension with chronic kidney disease: 151/66. Home regimen of amlodipine  and losartan .  - holding losartan  - continue amlodipine .   Secondary Hyperparathyroidism: PTH 114. Not currently on vitamin D  agent.   Anemia with chronic kidney disease: hemoglobin 9.9. No indication for ESA   LOS: 0 Cody Howard 8/2/202512:53 PM

## 2023-10-09 ENCOUNTER — Inpatient Hospital Stay

## 2023-10-09 DIAGNOSIS — N179 Acute kidney failure, unspecified: Secondary | ICD-10-CM | POA: Diagnosis not present

## 2023-10-09 DIAGNOSIS — N189 Chronic kidney disease, unspecified: Secondary | ICD-10-CM | POA: Diagnosis not present

## 2023-10-09 LAB — BASIC METABOLIC PANEL WITH GFR
Anion gap: 8 (ref 5–15)
BUN: 48 mg/dL — ABNORMAL HIGH (ref 8–23)
CO2: 23 mmol/L (ref 22–32)
Calcium: 8.3 mg/dL — ABNORMAL LOW (ref 8.9–10.3)
Chloride: 108 mmol/L (ref 98–111)
Creatinine, Ser: 3.13 mg/dL — ABNORMAL HIGH (ref 0.61–1.24)
GFR, Estimated: 19 mL/min — ABNORMAL LOW (ref >60)
Glucose, Bld: 214 mg/dL — ABNORMAL HIGH (ref 70–99)
Potassium: 4.4 mmol/L (ref 3.5–5.1)
Sodium: 139 mmol/L (ref 135–145)

## 2023-10-09 LAB — GLUCOSE, CAPILLARY
Glucose-Capillary: 211 mg/dL — ABNORMAL HIGH (ref 70–99)
Glucose-Capillary: 311 mg/dL — ABNORMAL HIGH (ref 70–99)
Glucose-Capillary: 235 mg/dL — ABNORMAL HIGH (ref 70–99)

## 2023-10-09 LAB — CBC
HCT: 29.4 % — ABNORMAL LOW (ref 39.0–52.0)
Hemoglobin: 10 g/dL — ABNORMAL LOW (ref 13.0–17.0)
MCH: 31.8 pg (ref 26.0–34.0)
MCHC: 34 g/dL (ref 30.0–36.0)
MCV: 93.6 fL (ref 80.0–100.0)
Platelets: 142 K/uL — ABNORMAL LOW (ref 150–400)
RBC: 3.14 MIL/uL — ABNORMAL LOW (ref 4.22–5.81)
RDW: 13.7 % (ref 11.5–15.5)
WBC: 7.6 K/uL (ref 4.0–10.5)
nRBC: 0 % (ref 0.0–0.2)

## 2023-10-09 MED ORDER — INSULIN GLARGINE-YFGN 100 UNIT/ML ~~LOC~~ SOLN
10.0000 [IU] | Freq: Once | SUBCUTANEOUS | Status: AC
  Administered 2023-10-09: 10 [IU] via SUBCUTANEOUS
  Filled 2023-10-09: qty 0.1

## 2023-10-09 MED ORDER — MEMANTINE HCL 5 MG PO TABS: 5.0000 mg | ORAL_TABLET | Freq: Two times a day (BID) | ORAL | 1 refills | Status: DC

## 2023-10-09 MED ORDER — MEMANTINE HCL 5 MG PO TABS: 5.0000 mg | ORAL_TABLET | Freq: Two times a day (BID) | ORAL | 1 refills | Status: AC

## 2023-10-09 MED ORDER — AQINJECT PEN NEEDLE 31G X 5 MM MISC: 1.0000 | Freq: Every evening | 1 refills | Status: AC

## 2023-10-09 MED ORDER — BLOOD GLUCOSE TEST VI STRP: 1.0000 | ORAL_STRIP | Freq: Every morning | 0 refills | Status: AC

## 2023-10-09 MED ORDER — BLOOD GLUCOSE MONITORING SUPPL DEVI: 1.0000 | Freq: Three times a day (TID) | 0 refills | Status: AC

## 2023-10-09 MED ORDER — LANCETS MISC. MISC: 1.0000 | Freq: Every morning | 0 refills | Status: AC

## 2023-10-09 MED ORDER — INSULIN GLARGINE 100 UNIT/ML SOLOSTAR PEN: 10.0000 [IU] | PEN_INJECTOR | Freq: Every day | SUBCUTANEOUS | 11 refills | Status: AC

## 2023-10-09 NOTE — Progress Notes (Signed)
 Discharge education reviewed with the patient, son, and caregiver. They were educated on how to check the patient's blood sugar, how to use an insulin  pen, performing dressing changes, s/s of hypo and hyperglycemia, medication changes, and s/s requiring a call to the provider. They were educated on the importance of scheduling a followup with his PCP and nephrologist, and on writing down his blood sugars to show the provider. The patient's son was able to correctly perform a blood glucose check independently on the patient. They verbalized understanding of all the education. All of their questions and concerns were addressed to their satisfaction. They had no other questions or concerns at this time. Patient being dressed at this time, pending a wheelchair down for discharge.

## 2023-10-09 NOTE — Progress Notes (Signed)
 This RN called into room by another nurse stating the patient had fallen. Upon assessment, the patient had already gotten back into bed. He was noted to have several bloody skin tears to his BUE and he was noted to have a small abrasion to the posterior aspect of his head. The patient was assessed and noted to be at his baseline orientation level of person and place. His pupils were equal and reactive, he was able to carry on a conversation and he explained that he was trying to get to the bedside commode when he fell. He also reported hitting the back of his head against the bedside commode when he fell. He had a private duty sitter at bedside who confirmed this. The provider was immediately paged by the charge nurse, see new orders. His wounds were cleansed and dressed per standing wound care order protocols. He was placed in a locked and low bed with the bed alarm on. A fall bracelet was applied, and he and the caregiver were educated on the importance of not getting out of bed without staff assistance. They verbalized understanding. Patient stable and awaiting STAT head CT. Spouse was called and made aware of fall.

## 2023-10-09 NOTE — Progress Notes (Signed)
 [facility]  ROUNDING NOTE   Subjective:  [name] was admitted to [facility] on 10/07/2023 for Generalized weakness [R53.1] Acute kidney injury superimposed on chronic kidney disease (HCC) [N17.9, N18.9] Type 2 diabetes mellitus with hyperglycemia, without long-term current use of insulin  (HCC) [E11.65] Stage 4 chronic kidney disease (HCC) [N18.4] Patient was seen in our clinic on 7/31 by Dr. Marcelino. Found to have severe increase in creatinine 3.94 with hyperglycemia, hyponatremia and acute metabolic acidosis.  Update: Patient seen during rounding at bedtime.  Per wife, patient had fall this morning.  CT negative.  No complaints to offer continuing to encourage p.o. intake.   Objective:  Vital signs in last 24 hours:  Temp:  [97.3 F (36.3 C)-99.5 F (37.5 C)] 97.3 F (36.3 C) (08/03 0754) Pulse Rate:  [73-107] 107 (08/03 0754) Resp:  [15-18] 18 (08/03 0754) BP: (158-183)/(78-84) 183/84 (08/03 0754) SpO2:  [98 %] 98 % (08/03 0754)  Weight change:  Filed Weights   10/07/23 1235 10/07/23 2200  Weight: 81.6 kg 82.8 kg    Intake/Output: I/O last 3 completed shifts: In: 2032.2 [P.O.:540; I.V.:1492.2] Out: 500 [Urine:500]   Intake/Output this shift:  No intake/output data recorded.  Physical Exam: General: NAD,   Head: Normocephalic  Eyes: Anicteric  Neck: Intact  Lungs:  Room air  Heart: Regular rate and rhythm  Abdomen:  Soft, nontender,   Extremities:  no peripheral edema.  Neurologic: alert  Skin: No rashes  Access: None    Basic Metabolic Panel: Recent Labs  Lab 10/07/23 1238 10/08/23 0542 10/09/23 0642  NA 133* 135 139  K 5.3* 5.0 4.4  CL 104 110 108  CO2 19* 18* 23  GLUCOSE 442* 137* 214*  BUN 48* 55* 48*  CREATININE 3.71* 3.44* 3.13*  CALCIUM  8.2* 8.2* 8.3*    Liver Function Tests: Recent Labs  Lab 10/07/23 1238  AST 19  ALT 12  ALKPHOS 87  BILITOT 0.5  PROT 5.7*  ALBUMIN 2.8*   No results for input(s): LIPASE,  AMYLASE in the last 168 hours. No results for input(s): AMMONIA in the last 168 hours.  CBC: Recent Labs  Lab 10/07/23 1238 10/08/23 0542 10/09/23 0642  WBC 9.1 10.6* 7.6  NEUTROABS 7.2  --   --   HGB 11.0* 9.9* 10.0*  HCT 33.0* 28.7* 29.4*  MCV 95.4 94.1 93.6  PLT 161 137* 142*    Cardiac Enzymes: No results for input(s): CKTOTAL, CKMB, CKMBINDEX, TROPONINI in the last 168 hours.  BNP: Invalid input(s): POCBNP  CBG: Recent Labs  Lab 10/08/23 0847 10/08/23 1159 10/08/23 1655 10/08/23 2230 10/09/23 0810  GLUCAP 166* 258* 200* 248* 211*    Microbiology: Results for orders placed or performed in visit on 08/13/20  Microscopic Examination     Status: Abnormal   Collection Time: 08/13/20  1:30 PM   Urine  Result Value Ref Range Status   WBC, UA 0-5 0 - 5 /hpf Final   RBC, Urine 0-2 0 - 2 /hpf Final   Epithelial Cells (non renal) None seen 0 - 10 /hpf Final   Casts Present (A) None seen /lpf Final   Cast Type Hyaline casts N/A Final   Mucus, UA Present (A) Not Estab. Final   Bacteria, UA None seen None seen/Few Final    Coagulation Studies: No results for input(s): LABPROT, INR in the last 72 hours.  Urinalysis: Recent Labs    10/07/23 1238  COLORURINE YELLOW*  LABSPEC 1.013  PHURINE 5.0  GLUCOSEU >=500*  HGBUR SMALL*  BILIRUBINUR NEGATIVE  KETONESUR NEGATIVE  PROTEINUR >=300*  NITRITE NEGATIVE  LEUKOCYTESUR NEGATIVE      Imaging: CT HEAD WO CONTRAST ( ) Result Date: 10/09/2023 CLINICAL DATA:  Fall with head injury. EXAM: CT HEAD WITHOUT CONTRAST TECHNIQUE: Contiguous axial images were obtained from the base of the skull through the vertex without intravenous contrast. RADIATION DOSE REDUCTION: This exam was performed according to the departmental dose-optimization program which includes automated exposure control, adjustment of the mA and/or kV according to patient size and/or use of iterative reconstruction technique. COMPARISON:   01/08/2023 FINDINGS: Brain: Ventricles, cisterns and other CSF spaces are normal. There is no mass, mass effect, shift of midline structures or acute hemorrhage. No evidence of acute infarction. There is mild age related atrophic changes well as mild to moderate chronic ischemic microvascular disease. Vascular: No hyperdense vessel or unexpected calcification. Skull: Normal. Negative for fracture or focal lesion. Sinuses/Orbits: No acute finding. Other: Minimal soft tissue swelling over the left parietal scalp. IMPRESSION: 1. No acute findings. 2. Mild age related atrophic changes and mild to moderate chronic ischemic microvascular disease. 3. Minimal soft tissue swelling over the left parietal scalp. Electronically Signed   By: Toribio Agreste M.D.   On: 10/09/2023 09:27   US  RENAL Result Date: 10/08/2023 EXAM: RETROPERITONEAL ULTRASOUND OF THE KIDNEYS AND URINARY BLADDER TECHNIQUE: Real-time ultrasonography of the retroperitoneum including the kidneys and urinary bladder was performed. COMPARISON: Renal sonogram dated 01/09/2023. CLINICAL HISTORY: 409830 AKI (acute kidney injury) (HCC) 409830. AKI (acute kidney injury) (HCC) FINDINGS: RIGHT KIDNEY: The right kidney measures 11.4 x 5.8 x 5.8 cm, with an estimated volume of 200 ml. The right kidney demonstrates mild echogenicity. There is mild right hydronephrosis. LEFT KIDNEY: The left kidney measures 9.9 x 4.9 x 4.3 cm, with an estimated volume of 109 ml. The left kidney demonstrates mild echogenicity. No hydronephrosis or intrarenal stones. BLADDER: Unremarkable appearance of the bladder. IMPRESSION: 1. Mild right hydronephrosis. 2. Mildly echogenic kidneys bilaterally. Electronically signed by: evalene coho 10/08/2023 12:47 PM EDT RP Workstation: [id number]   DG Chest 2 View Result Date: 10/07/2023 CLINICAL DATA:  Renal insufficiency and hyperglycemia. EXAM: CHEST - 2 VIEW COMPARISON:  06/27/2019 FINDINGS: The heart remains borderline enlarged. Clear lungs  with normal vascularity. Mild upper thoracic spine degenerative changes and changes of DISH. IMPRESSION: No acute abnormality. Electronically Signed   By: Elspeth Bathe M.D.   On: 10/07/2023 17:06     Medications:     amLODipine   10 mg Oral Daily   apixaban   2.5 mg Oral BID   cholecalciferol   5,000 Units Oral Daily   insulin  aspart  0-15 Units Subcutaneous TID WC   insulin  aspart  0-5 Units Subcutaneous QHS   memantine   5 mg Oral BID   montelukast   10 mg Oral QHS   sertraline   25 mg Oral Daily   acetaminophen  **OR** acetaminophen , fluticasone , magnesium  hydroxide, ondansetron  **OR** ondansetron  (ZOFRAN ) IV, traZODone   Assessment/ Plan:  Mr. JAQUES MINEER is a 89 y.o.  male with dementia, hypertension, diabetes mellitus type II diet controlled, atrial fibrillation, hyperlipidemia, and congestive heart failure who is admitted to [facility] on 10/07/2023 for Generalized weakness [R53.1] Acute kidney injury superimposed on chronic kidney disease (HCC) [N17.9, N18.9] Type 2 diabetes mellitus with hyperglycemia, without long-term current use of insulin  (HCC) [E11.65] Stage 4 chronic kidney disease (HCC) [N18.4]   Acute Kidney Injury on chronic kidney disease stage IV with proteinuria. Concern for poor PO intake leading  to prerenal azotemia. Improved renal function and urine output with IVF. No IV contrast exposure.  - losartan  held - 10/08/2023 renal ultrasound: Mild right hydronephrosis. Mildly echogenic kidneys bilaterally. - no acute indication for dialysis.  Lab Results  Component Value Date   CREATININE 3.13 (H) 10/09/2023   CREATININE 3.44 (H) 10/08/2023   CREATININE 3.71 (H) 10/07/2023     Intake/Output Summary (Last 24 hours) at 10/09/2023 1207 Last data filed at 10/08/2023 2335 Gross per 24 hour  Intake 1478.33 ml  Output --  Net 1478.33 ml      Diabetes mellitus type II with chronic kidney disease: hemoglobin A1c of 7% on 03/28/23. Follows with endocrinology as outpatient.  - not  currently on an SGLT-2 inhibitor.  -continue SSI and glucose monitoring    Hypertension with chronic kidney disease: BP 183/84 today.  Home regimen of amlodipine  and losartan .  - holding losartan  - continue amlodipine .    Secondary Hyperparathyroidism: PTH 114. Not currently on vitamin D  agent.    Anemia with chronic kidney disease: hemoglobin 10.0 improved. No indication for ESA   LOS: 1 Annalisa Colonna P Levorn 8/3/202511:58 AM

## 2023-10-09 NOTE — Discharge Instructions (Signed)
 Check fasting sugar every morning. Goal is 120-160. Increase insulin  by 1 unit every night until sugar is in the desired range. Sugars less than 70 are dangerous.

## 2023-10-09 NOTE — Discharge Summary (Signed)
 Cody Howard FMW:[id number] DOB: Apr 01, 1934 DOA: 10/07/2023  PCP: Salli Amato, MD  Admit date: 10/07/2023 Discharge date: 10/09/2023  Time spent: 35 minutes  Recommendations for Outpatient Follow-up:  Pcp f/u 1 week, attention to glucose then Nephrology f/u     Discharge Diagnoses:  Principal Problem:   Acute kidney injury superimposed on chronic kidney disease (HCC) Active Problems:   Essential hypertension   Depression   Type 2 diabetes mellitus with chronic kidney disease, without long-term current use of insulin  (HCC)   Paroxysmal atrial fibrillation (HCC)   Alzheimer's disease (HCC)   CKD (chronic kidney disease) stage 4, GFR 15-29 ml/min (HCC)   Discharge Condition: stable  Diet recommendation: carb modified  Filed Weights   10/07/23 1235 10/07/23 2200  Weight: 81.6 kg 82.8 kg    History of present illness:  From admission h and p  Cody Howard is a 89 y.o. Caucasian male with medical history significant for paroxysmal atrial fibrillation, chronic kidney disease, type 2 diabetes mellitus, hypertension, dyslipidemia and osteoarthritis, who presented to the emergency room with acute onset of generalized weakness.  The patient was sent to the emergency room by Dr. Marcelino.  Today.  The patient's creatinine went up to 3.9 from his baseline of 3 and his blood glucose was 350.  The patient denies any nausea or vomiting or diarrhea or abdominal pain.  No fever or chills.  No dysuria, oliguria or hematuria or flank pain.  No pain or palpitations.  No cough or wheezing or dyspnea.   Hospital Course:   Patient presents with AKI on ckd 4, also with hyperglycemia. For aki likely prerenal. Renal u/s no obstruction. AKI improved with fluids and kidney function now near baseline. Losartan , hyperglycemia, and reduced po likely all contributed. For the AKI we have held losartan  and advise adequate oral intake and close f/u with pcp and nephrology for ongoing monitoring. For  hyperglycemia we will transition to insulin , stop home sulfonylurea. Titration instructions given, goal for now 120-160, advise close pcp f/u. BP elevated off losartan  but given fall risk shared decision to forgo additional agents for the time being. Patient did have an unwitnessed fall day of discharge, small contusion back of head and skin tears on right arm. CT head no fracture or bleeding, wound care provided.   Procedures: none   Consultations: nephrology  Discharge Exam: Vitals:   10/09/23 0409 10/09/23 0754  BP: (!) 158/80 (!) 183/84  Pulse: 73 (!) 107  Resp: 15 18  Temp: 97.6 F (36.4 C) (!) 97.3 F (36.3 C)  SpO2: 98% 98%    General: NAD Cardiovascular: RRR Respiratory: CTAB Skin: skin tear right forearm. Small contusion posterior scalp  Discharge Instructions   Discharge Instructions     Diet - low sodium heart healthy   Complete by: As directed    Discharge wound care:   Complete by: As directed    Keep clean and covered   Increase activity slowly   Complete by: As directed       Allergies as of 10/09/2023   No Known Allergies      Medication List     PAUSE taking these medications    losartan  50 MG tablet Wait to take this until your doctor or other care provider tells you to start again. Commonly known as: COZAAR  Take 50 mg by mouth daily.       STOP taking these medications    glipiZIDE  5 MG tablet Commonly known as: GLUCOTROL   TAKE these medications    Align 4 MG Caps Take 1 capsule (4 mg total) by mouth at bedtime.   amLODipine  10 MG tablet Commonly known as: NORVASC  TAKE ONE TABLET BY MOUTH ONE TIME A DAY FOR HYPERTENSION   apixaban  2.5 MG Tabs tablet Commonly known as: ELIQUIS  Take 2.5 mg by mouth 2 (two) times daily.   AQInject Pen Needle 31G X 5 MM Misc Generic drug: Insulin  Pen Needle 1 each by Does not apply route at bedtime.   Blood Glucose Monitoring Suppl Devi 1 each by Does not apply route in the morning,  at noon, and at bedtime. May substitute to any manufacturer covered by patient's insurance.   BLOOD GLUCOSE TEST STRIPS Strp 1 each by In Vitro route every morning. May substitute to any manufacturer covered by patient's insurance.   CITRACAL + D PO Take 1 tablet by mouth daily.   clotrimazole-betamethasone cream Commonly known as: LOTRISONE Apply 1 Application topically 2 (two) times daily.   D3 High Potency 125 MCG (5000 UT) capsule Generic drug: Cholecalciferol  TAKE ONE CAPSULE BY MOUTH ONE TIME A DAY FOR SUPPLEMENT   ferrous sulfate  325 (65 FE) MG tablet Commonly known as: FeroSul Take 1 tablet (325 mg total) by mouth daily with breakfast.   Flonase  Allergy Relief 50 MCG/ACT nasal spray Generic drug: fluticasone  Place into the nose.   insulin  glargine 100 UNIT/ML Solostar Pen Commonly known as: LANTUS  Inject 10 Units into the skin at bedtime.   Lancets Misc. Misc 1 each by Does not apply route every morning. May substitute to any manufacturer covered by patient's insurance.   memantine  5 MG tablet Commonly known as: NAMENDA  Take 1 tablet (5 mg total) by mouth 2 (two) times daily. What changed:  medication strength how much to take   montelukast  10 MG tablet Commonly known as: SINGULAIR  Take 1 tablet (10 mg total) by mouth at bedtime.   mupirocin  ointment 2 % Commonly known as: BACTROBAN  Apply 1 Application topically 2 (two) times daily.   nystatin  cream Commonly known as: MYCOSTATIN  Apply 1 Application topically 2 (two) times daily.   sertraline  25 MG tablet Commonly known as: ZOLOFT  Take 1 tablet (25 mg total) by mouth daily.   Zinc  Oxide 16 % Oint Apply topically. 20%               Discharge Care Instructions  (From admission, onward)           Start     Ordered   10/09/23 0000  Discharge wound care:       Comments: Keep clean and covered   10/09/23 1244           No Known Allergies  Follow-up Information     Salli Amato,  MD Follow up.   Specialty: Internal Medicine Why: 1 week Contact information: [address] [place] [address] [phone]         Marcelino Nurse, MD Follow up.   Specialty: Pain Medicine Contact information: [address] [place] [address] [phone]                  The results of significant diagnostics from this hospitalization (including imaging, microbiology, ancillary and laboratory) are listed below for reference.    Significant Diagnostic Studies: CT HEAD WO CONTRAST ( ) Result Date: 10/09/2023 CLINICAL DATA:  Fall with head injury. EXAM: CT HEAD WITHOUT CONTRAST TECHNIQUE: Contiguous axial images were obtained from the base of the skull through the vertex without  intravenous contrast. RADIATION DOSE REDUCTION: This exam was performed according to the departmental dose-optimization program which includes automated exposure control, adjustment of the mA and/or kV according to patient size and/or use of iterative reconstruction technique. COMPARISON:  01/08/2023 FINDINGS: Brain: Ventricles, cisterns and other CSF spaces are normal. There is no mass, mass effect, shift of midline structures or acute hemorrhage. No evidence of acute infarction. There is mild age related atrophic changes well as mild to moderate chronic ischemic microvascular disease. Vascular: No hyperdense vessel or unexpected calcification. Skull: Normal. Negative for fracture or focal lesion. Sinuses/Orbits: No acute finding. Other: Minimal soft tissue swelling over the left parietal scalp. IMPRESSION: 1. No acute findings. 2. Mild age related atrophic changes and mild to moderate chronic ischemic microvascular disease. 3. Minimal soft tissue swelling over the left parietal scalp. Electronically Signed   By: Toribio Agreste M.D.   On: 10/09/2023 09:27   US  RENAL Result Date: 10/08/2023 EXAM: RETROPERITONEAL ULTRASOUND OF THE KIDNEYS AND URINARY BLADDER TECHNIQUE: Real-time ultrasonography  of the retroperitoneum including the kidneys and urinary bladder was performed. COMPARISON: Renal sonogram dated 01/09/2023. CLINICAL HISTORY: 409830 AKI (acute kidney injury) (HCC) 409830. AKI (acute kidney injury) (HCC) FINDINGS: RIGHT KIDNEY: The right kidney measures 11.4 x 5.8 x 5.8 cm, with an estimated volume of 200 ml. The right kidney demonstrates mild echogenicity. There is mild right hydronephrosis. LEFT KIDNEY: The left kidney measures 9.9 x 4.9 x 4.3 cm, with an estimated volume of 109 ml. The left kidney demonstrates mild echogenicity. No hydronephrosis or intrarenal stones. BLADDER: Unremarkable appearance of the bladder. IMPRESSION: 1. Mild right hydronephrosis. 2. Mildly echogenic kidneys bilaterally. Electronically signed by: evalene coho 10/08/2023 12:47 PM EDT RP Workstation: [id number]   DG Chest 2 View Result Date: 10/07/2023 CLINICAL DATA:  Renal insufficiency and hyperglycemia. EXAM: CHEST - 2 VIEW COMPARISON:  06/27/2019 FINDINGS: The heart remains borderline enlarged. Clear lungs with normal vascularity. Mild upper thoracic spine degenerative changes and changes of DISH. IMPRESSION: No acute abnormality. Electronically Signed   By: Elspeth Bathe M.D.   On: 10/07/2023 17:06    Microbiology: No results found for this or any previous visit (from the past 240 hours).   Labs: Basic Metabolic Panel: Recent Labs  Lab 10/07/23 1238 10/08/23 0542 10/09/23 0642  NA 133* 135 139  K 5.3* 5.0 4.4  CL 104 110 108  CO2 19* 18* 23  GLUCOSE 442* 137* 214*  BUN 48* 55* 48*  CREATININE 3.71* 3.44* 3.13*  CALCIUM  8.2* 8.2* 8.3*   Liver Function Tests: Recent Labs  Lab 10/07/23 1238  AST 19  ALT 12  ALKPHOS 87  BILITOT 0.5  PROT 5.7*  ALBUMIN 2.8*   No results for input(s): LIPASE, AMYLASE in the last 168 hours. No results for input(s): AMMONIA in the last 168 hours. CBC: Recent Labs  Lab 10/07/23 1238 10/08/23 0542 10/09/23 0642  WBC 9.1 10.6* 7.6  NEUTROABS  7.2  --   --   HGB 11.0* 9.9* 10.0*  HCT 33.0* 28.7* 29.4*  MCV 95.4 94.1 93.6  PLT 161 137* 142*   Cardiac Enzymes: No results for input(s): CKTOTAL, CKMB, CKMBINDEX, TROPONINI in the last 168 hours. BNP: BNP (last 3 results) Recent Labs    10/07/23 1238  BNP 40.3    ProBNP (last 3 results) No results for input(s): PROBNP in the last 8760 hours.  CBG: Recent Labs  Lab 10/08/23 1159 10/08/23 1655 10/08/23 2230 10/09/23 0810 10/09/23 1158  GLUCAP 258* 200* 248* 211*  311*       Signed:  Devaughn KATHEE Ban MD.  [facility] 10/09/2023, 12:51 PM

## 2023-10-10 LAB — HEMOGLOBIN A1C
Hgb A1c MFr Bld: 11.4 % — ABNORMAL HIGH (ref 4.8–5.6)
Mean Plasma Glucose: 280 mg/dL

## 2023-10-11 DIAGNOSIS — E1122 Type 2 diabetes mellitus with diabetic chronic kidney disease: Secondary | ICD-10-CM | POA: Diagnosis not present

## 2023-10-11 DIAGNOSIS — Z87891 Personal history of nicotine dependence: Secondary | ICD-10-CM | POA: Diagnosis not present

## 2023-10-11 DIAGNOSIS — F03B0 Unspecified dementia, moderate, without behavioral disturbance, psychotic disturbance, mood disturbance, and anxiety: Secondary | ICD-10-CM | POA: Diagnosis not present

## 2023-10-11 DIAGNOSIS — Z96642 Presence of left artificial hip joint: Secondary | ICD-10-CM | POA: Diagnosis not present

## 2023-10-11 DIAGNOSIS — K5900 Constipation, unspecified: Secondary | ICD-10-CM | POA: Diagnosis not present

## 2023-10-11 DIAGNOSIS — Z9181 History of falling: Secondary | ICD-10-CM | POA: Diagnosis not present

## 2023-10-11 DIAGNOSIS — Z860101 Personal history of adenomatous and serrated colon polyps: Secondary | ICD-10-CM | POA: Diagnosis not present

## 2023-10-11 DIAGNOSIS — M1990 Unspecified osteoarthritis, unspecified site: Secondary | ICD-10-CM | POA: Diagnosis not present

## 2023-10-11 DIAGNOSIS — I4891 Unspecified atrial fibrillation: Secondary | ICD-10-CM | POA: Diagnosis not present

## 2023-10-11 DIAGNOSIS — N189 Chronic kidney disease, unspecified: Secondary | ICD-10-CM | POA: Diagnosis not present

## 2023-10-11 DIAGNOSIS — I429 Cardiomyopathy, unspecified: Secondary | ICD-10-CM | POA: Diagnosis not present

## 2023-10-11 DIAGNOSIS — E7800 Pure hypercholesterolemia, unspecified: Secondary | ICD-10-CM | POA: Diagnosis not present

## 2023-10-11 DIAGNOSIS — I5022 Chronic systolic (congestive) heart failure: Secondary | ICD-10-CM | POA: Diagnosis not present

## 2023-10-11 DIAGNOSIS — H9193 Unspecified hearing loss, bilateral: Secondary | ICD-10-CM | POA: Diagnosis not present

## 2023-10-11 DIAGNOSIS — I130 Hypertensive heart and chronic kidney disease with heart failure and stage 1 through stage 4 chronic kidney disease, or unspecified chronic kidney disease: Secondary | ICD-10-CM | POA: Diagnosis not present

## 2023-10-11 DIAGNOSIS — Z7901 Long term (current) use of anticoagulants: Secondary | ICD-10-CM | POA: Diagnosis not present

## 2023-10-11 DIAGNOSIS — Z8673 Personal history of transient ischemic attack (TIA), and cerebral infarction without residual deficits: Secondary | ICD-10-CM | POA: Diagnosis not present

## 2023-10-11 DIAGNOSIS — I951 Orthostatic hypotension: Secondary | ICD-10-CM | POA: Diagnosis not present

## 2023-10-13 DIAGNOSIS — Z7901 Long term (current) use of anticoagulants: Secondary | ICD-10-CM | POA: Diagnosis not present

## 2023-10-13 DIAGNOSIS — Z87891 Personal history of nicotine dependence: Secondary | ICD-10-CM | POA: Diagnosis not present

## 2023-10-13 DIAGNOSIS — I5022 Chronic systolic (congestive) heart failure: Secondary | ICD-10-CM | POA: Diagnosis not present

## 2023-10-13 DIAGNOSIS — F03B0 Unspecified dementia, moderate, without behavioral disturbance, psychotic disturbance, mood disturbance, and anxiety: Secondary | ICD-10-CM | POA: Diagnosis not present

## 2023-10-13 DIAGNOSIS — Z794 Long term (current) use of insulin: Secondary | ICD-10-CM | POA: Diagnosis not present

## 2023-10-13 DIAGNOSIS — I951 Orthostatic hypotension: Secondary | ICD-10-CM | POA: Diagnosis not present

## 2023-10-13 DIAGNOSIS — E7800 Pure hypercholesterolemia, unspecified: Secondary | ICD-10-CM | POA: Diagnosis not present

## 2023-10-13 DIAGNOSIS — I130 Hypertensive heart and chronic kidney disease with heart failure and stage 1 through stage 4 chronic kidney disease, or unspecified chronic kidney disease: Secondary | ICD-10-CM | POA: Diagnosis not present

## 2023-10-13 DIAGNOSIS — K5900 Constipation, unspecified: Secondary | ICD-10-CM | POA: Diagnosis not present

## 2023-10-13 DIAGNOSIS — H9193 Unspecified hearing loss, bilateral: Secondary | ICD-10-CM | POA: Diagnosis not present

## 2023-10-13 DIAGNOSIS — Z8673 Personal history of transient ischemic attack (TIA), and cerebral infarction without residual deficits: Secondary | ICD-10-CM | POA: Diagnosis not present

## 2023-10-13 DIAGNOSIS — Z96642 Presence of left artificial hip joint: Secondary | ICD-10-CM | POA: Diagnosis not present

## 2023-10-13 DIAGNOSIS — Z9181 History of falling: Secondary | ICD-10-CM | POA: Diagnosis not present

## 2023-10-13 DIAGNOSIS — E1122 Type 2 diabetes mellitus with diabetic chronic kidney disease: Secondary | ICD-10-CM | POA: Diagnosis not present

## 2023-10-13 DIAGNOSIS — I4891 Unspecified atrial fibrillation: Secondary | ICD-10-CM | POA: Diagnosis not present

## 2023-10-13 DIAGNOSIS — I429 Cardiomyopathy, unspecified: Secondary | ICD-10-CM | POA: Diagnosis not present

## 2023-10-13 DIAGNOSIS — M1990 Unspecified osteoarthritis, unspecified site: Secondary | ICD-10-CM | POA: Diagnosis not present

## 2023-10-13 DIAGNOSIS — N189 Chronic kidney disease, unspecified: Secondary | ICD-10-CM | POA: Diagnosis not present

## 2023-10-13 DIAGNOSIS — Z860101 Personal history of adenomatous and serrated colon polyps: Secondary | ICD-10-CM | POA: Diagnosis not present

## 2023-10-13 DIAGNOSIS — E1165 Type 2 diabetes mellitus with hyperglycemia: Secondary | ICD-10-CM | POA: Diagnosis not present

## 2023-10-14 DIAGNOSIS — E1159 Type 2 diabetes mellitus with other circulatory complications: Secondary | ICD-10-CM | POA: Diagnosis not present

## 2023-10-14 DIAGNOSIS — I152 Hypertension secondary to endocrine disorders: Secondary | ICD-10-CM | POA: Diagnosis not present

## 2023-10-14 DIAGNOSIS — E1169 Type 2 diabetes mellitus with other specified complication: Secondary | ICD-10-CM | POA: Diagnosis not present

## 2023-10-14 DIAGNOSIS — E785 Hyperlipidemia, unspecified: Secondary | ICD-10-CM | POA: Diagnosis not present

## 2023-10-14 DIAGNOSIS — E1165 Type 2 diabetes mellitus with hyperglycemia: Secondary | ICD-10-CM | POA: Diagnosis not present

## 2023-10-17 DIAGNOSIS — N189 Chronic kidney disease, unspecified: Secondary | ICD-10-CM | POA: Diagnosis not present

## 2023-10-17 DIAGNOSIS — K5900 Constipation, unspecified: Secondary | ICD-10-CM | POA: Diagnosis not present

## 2023-10-17 DIAGNOSIS — I4891 Unspecified atrial fibrillation: Secondary | ICD-10-CM | POA: Diagnosis not present

## 2023-10-17 DIAGNOSIS — E7800 Pure hypercholesterolemia, unspecified: Secondary | ICD-10-CM | POA: Diagnosis not present

## 2023-10-17 DIAGNOSIS — Z7901 Long term (current) use of anticoagulants: Secondary | ICD-10-CM | POA: Diagnosis not present

## 2023-10-17 DIAGNOSIS — I951 Orthostatic hypotension: Secondary | ICD-10-CM | POA: Diagnosis not present

## 2023-10-17 DIAGNOSIS — Z96642 Presence of left artificial hip joint: Secondary | ICD-10-CM | POA: Diagnosis not present

## 2023-10-17 DIAGNOSIS — Z9181 History of falling: Secondary | ICD-10-CM | POA: Diagnosis not present

## 2023-10-17 DIAGNOSIS — Z8673 Personal history of transient ischemic attack (TIA), and cerebral infarction without residual deficits: Secondary | ICD-10-CM | POA: Diagnosis not present

## 2023-10-17 DIAGNOSIS — Z87891 Personal history of nicotine dependence: Secondary | ICD-10-CM | POA: Diagnosis not present

## 2023-10-17 DIAGNOSIS — Z860101 Personal history of adenomatous and serrated colon polyps: Secondary | ICD-10-CM | POA: Diagnosis not present

## 2023-10-17 DIAGNOSIS — I5022 Chronic systolic (congestive) heart failure: Secondary | ICD-10-CM | POA: Diagnosis not present

## 2023-10-17 DIAGNOSIS — I130 Hypertensive heart and chronic kidney disease with heart failure and stage 1 through stage 4 chronic kidney disease, or unspecified chronic kidney disease: Secondary | ICD-10-CM | POA: Diagnosis not present

## 2023-10-17 DIAGNOSIS — M1990 Unspecified osteoarthritis, unspecified site: Secondary | ICD-10-CM | POA: Diagnosis not present

## 2023-10-17 DIAGNOSIS — E1122 Type 2 diabetes mellitus with diabetic chronic kidney disease: Secondary | ICD-10-CM | POA: Diagnosis not present

## 2023-10-17 DIAGNOSIS — F03B0 Unspecified dementia, moderate, without behavioral disturbance, psychotic disturbance, mood disturbance, and anxiety: Secondary | ICD-10-CM | POA: Diagnosis not present

## 2023-10-17 DIAGNOSIS — I429 Cardiomyopathy, unspecified: Secondary | ICD-10-CM | POA: Diagnosis not present

## 2023-10-17 DIAGNOSIS — H9193 Unspecified hearing loss, bilateral: Secondary | ICD-10-CM | POA: Diagnosis not present

## 2023-10-18 DIAGNOSIS — Z7901 Long term (current) use of anticoagulants: Secondary | ICD-10-CM | POA: Diagnosis not present

## 2023-10-18 DIAGNOSIS — Z9181 History of falling: Secondary | ICD-10-CM | POA: Diagnosis not present

## 2023-10-18 DIAGNOSIS — Z87891 Personal history of nicotine dependence: Secondary | ICD-10-CM | POA: Diagnosis not present

## 2023-10-18 DIAGNOSIS — N189 Chronic kidney disease, unspecified: Secondary | ICD-10-CM | POA: Diagnosis not present

## 2023-10-18 DIAGNOSIS — I4891 Unspecified atrial fibrillation: Secondary | ICD-10-CM | POA: Diagnosis not present

## 2023-10-18 DIAGNOSIS — I951 Orthostatic hypotension: Secondary | ICD-10-CM | POA: Diagnosis not present

## 2023-10-18 DIAGNOSIS — M1990 Unspecified osteoarthritis, unspecified site: Secondary | ICD-10-CM | POA: Diagnosis not present

## 2023-10-18 DIAGNOSIS — K5900 Constipation, unspecified: Secondary | ICD-10-CM | POA: Diagnosis not present

## 2023-10-18 DIAGNOSIS — Z8673 Personal history of transient ischemic attack (TIA), and cerebral infarction without residual deficits: Secondary | ICD-10-CM | POA: Diagnosis not present

## 2023-10-18 DIAGNOSIS — Z96642 Presence of left artificial hip joint: Secondary | ICD-10-CM | POA: Diagnosis not present

## 2023-10-18 DIAGNOSIS — I5022 Chronic systolic (congestive) heart failure: Secondary | ICD-10-CM | POA: Diagnosis not present

## 2023-10-18 DIAGNOSIS — E7800 Pure hypercholesterolemia, unspecified: Secondary | ICD-10-CM | POA: Diagnosis not present

## 2023-10-18 DIAGNOSIS — H9193 Unspecified hearing loss, bilateral: Secondary | ICD-10-CM | POA: Diagnosis not present

## 2023-10-18 DIAGNOSIS — F03B0 Unspecified dementia, moderate, without behavioral disturbance, psychotic disturbance, mood disturbance, and anxiety: Secondary | ICD-10-CM | POA: Diagnosis not present

## 2023-10-18 DIAGNOSIS — I429 Cardiomyopathy, unspecified: Secondary | ICD-10-CM | POA: Diagnosis not present

## 2023-10-18 DIAGNOSIS — E1122 Type 2 diabetes mellitus with diabetic chronic kidney disease: Secondary | ICD-10-CM | POA: Diagnosis not present

## 2023-10-18 DIAGNOSIS — Z860101 Personal history of adenomatous and serrated colon polyps: Secondary | ICD-10-CM | POA: Diagnosis not present

## 2023-10-18 DIAGNOSIS — I130 Hypertensive heart and chronic kidney disease with heart failure and stage 1 through stage 4 chronic kidney disease, or unspecified chronic kidney disease: Secondary | ICD-10-CM | POA: Diagnosis not present

## 2023-10-20 DIAGNOSIS — E7800 Pure hypercholesterolemia, unspecified: Secondary | ICD-10-CM | POA: Diagnosis not present

## 2023-10-20 DIAGNOSIS — F03B0 Unspecified dementia, moderate, without behavioral disturbance, psychotic disturbance, mood disturbance, and anxiety: Secondary | ICD-10-CM | POA: Diagnosis not present

## 2023-10-20 DIAGNOSIS — Z9181 History of falling: Secondary | ICD-10-CM | POA: Diagnosis not present

## 2023-10-20 DIAGNOSIS — M1990 Unspecified osteoarthritis, unspecified site: Secondary | ICD-10-CM | POA: Diagnosis not present

## 2023-10-20 DIAGNOSIS — Z860101 Personal history of adenomatous and serrated colon polyps: Secondary | ICD-10-CM | POA: Diagnosis not present

## 2023-10-20 DIAGNOSIS — Z7901 Long term (current) use of anticoagulants: Secondary | ICD-10-CM | POA: Diagnosis not present

## 2023-10-20 DIAGNOSIS — I5022 Chronic systolic (congestive) heart failure: Secondary | ICD-10-CM | POA: Diagnosis not present

## 2023-10-20 DIAGNOSIS — I429 Cardiomyopathy, unspecified: Secondary | ICD-10-CM | POA: Diagnosis not present

## 2023-10-20 DIAGNOSIS — I4891 Unspecified atrial fibrillation: Secondary | ICD-10-CM | POA: Diagnosis not present

## 2023-10-20 DIAGNOSIS — Z96642 Presence of left artificial hip joint: Secondary | ICD-10-CM | POA: Diagnosis not present

## 2023-10-20 DIAGNOSIS — I951 Orthostatic hypotension: Secondary | ICD-10-CM | POA: Diagnosis not present

## 2023-10-20 DIAGNOSIS — I130 Hypertensive heart and chronic kidney disease with heart failure and stage 1 through stage 4 chronic kidney disease, or unspecified chronic kidney disease: Secondary | ICD-10-CM | POA: Diagnosis not present

## 2023-10-20 DIAGNOSIS — Z87891 Personal history of nicotine dependence: Secondary | ICD-10-CM | POA: Diagnosis not present

## 2023-10-20 DIAGNOSIS — E1122 Type 2 diabetes mellitus with diabetic chronic kidney disease: Secondary | ICD-10-CM | POA: Diagnosis not present

## 2023-10-20 DIAGNOSIS — Z8673 Personal history of transient ischemic attack (TIA), and cerebral infarction without residual deficits: Secondary | ICD-10-CM | POA: Diagnosis not present

## 2023-10-20 DIAGNOSIS — H9193 Unspecified hearing loss, bilateral: Secondary | ICD-10-CM | POA: Diagnosis not present

## 2023-10-20 DIAGNOSIS — N189 Chronic kidney disease, unspecified: Secondary | ICD-10-CM | POA: Diagnosis not present

## 2023-10-20 DIAGNOSIS — K5900 Constipation, unspecified: Secondary | ICD-10-CM | POA: Diagnosis not present

## 2023-10-25 ENCOUNTER — Ambulatory Visit: Payer: Medicare HMO | Admitting: Urology

## 2023-10-25 VITALS — BP 131/73 | HR 85 | Wt 180.0 lb

## 2023-10-25 DIAGNOSIS — Z9181 History of falling: Secondary | ICD-10-CM | POA: Diagnosis not present

## 2023-10-25 DIAGNOSIS — I951 Orthostatic hypotension: Secondary | ICD-10-CM | POA: Diagnosis not present

## 2023-10-25 DIAGNOSIS — I4891 Unspecified atrial fibrillation: Secondary | ICD-10-CM | POA: Diagnosis not present

## 2023-10-25 DIAGNOSIS — R21 Rash and other nonspecific skin eruption: Secondary | ICD-10-CM

## 2023-10-25 DIAGNOSIS — K5900 Constipation, unspecified: Secondary | ICD-10-CM | POA: Diagnosis not present

## 2023-10-25 DIAGNOSIS — N189 Chronic kidney disease, unspecified: Secondary | ICD-10-CM | POA: Diagnosis not present

## 2023-10-25 DIAGNOSIS — E1122 Type 2 diabetes mellitus with diabetic chronic kidney disease: Secondary | ICD-10-CM | POA: Diagnosis not present

## 2023-10-25 DIAGNOSIS — H9193 Unspecified hearing loss, bilateral: Secondary | ICD-10-CM | POA: Diagnosis not present

## 2023-10-25 DIAGNOSIS — F03B0 Unspecified dementia, moderate, without behavioral disturbance, psychotic disturbance, mood disturbance, and anxiety: Secondary | ICD-10-CM | POA: Diagnosis not present

## 2023-10-25 DIAGNOSIS — I5022 Chronic systolic (congestive) heart failure: Secondary | ICD-10-CM | POA: Diagnosis not present

## 2023-10-25 DIAGNOSIS — I130 Hypertensive heart and chronic kidney disease with heart failure and stage 1 through stage 4 chronic kidney disease, or unspecified chronic kidney disease: Secondary | ICD-10-CM | POA: Diagnosis not present

## 2023-10-25 DIAGNOSIS — Z8673 Personal history of transient ischemic attack (TIA), and cerebral infarction without residual deficits: Secondary | ICD-10-CM | POA: Diagnosis not present

## 2023-10-25 DIAGNOSIS — Z7901 Long term (current) use of anticoagulants: Secondary | ICD-10-CM | POA: Diagnosis not present

## 2023-10-25 DIAGNOSIS — Z87891 Personal history of nicotine dependence: Secondary | ICD-10-CM | POA: Diagnosis not present

## 2023-10-25 DIAGNOSIS — Z860101 Personal history of adenomatous and serrated colon polyps: Secondary | ICD-10-CM | POA: Diagnosis not present

## 2023-10-25 DIAGNOSIS — E7800 Pure hypercholesterolemia, unspecified: Secondary | ICD-10-CM | POA: Diagnosis not present

## 2023-10-25 DIAGNOSIS — M1990 Unspecified osteoarthritis, unspecified site: Secondary | ICD-10-CM | POA: Diagnosis not present

## 2023-10-25 DIAGNOSIS — I429 Cardiomyopathy, unspecified: Secondary | ICD-10-CM | POA: Diagnosis not present

## 2023-10-25 DIAGNOSIS — Z96642 Presence of left artificial hip joint: Secondary | ICD-10-CM | POA: Diagnosis not present

## 2023-10-25 NOTE — Progress Notes (Signed)
   10/25/2023 2:42 PM   Cody Howard [birth year] [id number]  Reason for visit: Genital rash, urinary incontinence  HPI: Frail 89 year old male Dr. Twylla followed previously for BPH, and I saw previously in May 2024 for an episode of microscopic hematuria after a fall.  They deferred further evaluation as a repeat urinalysis was benign.  I saw him last in February 2025 for a possible groin rash and concern about incontinence.  My understanding is that he was sent over from the PCP because he had some erythema in the groin that she felt was related to incontinence, however when I talk to the patient he denied any urinary leakage.  He wears a depends for safety but these are not typically wet.  He is here with his daughter and wife who provide most of the history.  He denies any major problems since his last visit.  Continues to have some mild erythema in the groin but this is not painful or bothersome.  On exam, mild redness in the groin creases and scrotum, no penile lesions.  PCP currently is prescribing a zinc  cream.  He denies any urinary symptoms again.  Was recently hospitalized for acute kidney injury, renal ultrasound essentially benign showing a stable right extrarenal pelvis that would not be contributing to his worsening renal function.  RTC 1 year symptom check, PVR  Cody JAYSON Burnet, MD  [facility] [address], [address] [place], [place] [address] [phone]

## 2023-10-26 DIAGNOSIS — H9193 Unspecified hearing loss, bilateral: Secondary | ICD-10-CM | POA: Diagnosis not present

## 2023-10-26 DIAGNOSIS — Z87891 Personal history of nicotine dependence: Secondary | ICD-10-CM | POA: Diagnosis not present

## 2023-10-26 DIAGNOSIS — I4891 Unspecified atrial fibrillation: Secondary | ICD-10-CM | POA: Diagnosis not present

## 2023-10-26 DIAGNOSIS — K5900 Constipation, unspecified: Secondary | ICD-10-CM | POA: Diagnosis not present

## 2023-10-26 DIAGNOSIS — I130 Hypertensive heart and chronic kidney disease with heart failure and stage 1 through stage 4 chronic kidney disease, or unspecified chronic kidney disease: Secondary | ICD-10-CM | POA: Diagnosis not present

## 2023-10-26 DIAGNOSIS — M1990 Unspecified osteoarthritis, unspecified site: Secondary | ICD-10-CM | POA: Diagnosis not present

## 2023-10-26 DIAGNOSIS — Z8673 Personal history of transient ischemic attack (TIA), and cerebral infarction without residual deficits: Secondary | ICD-10-CM | POA: Diagnosis not present

## 2023-10-26 DIAGNOSIS — Z860101 Personal history of adenomatous and serrated colon polyps: Secondary | ICD-10-CM | POA: Diagnosis not present

## 2023-10-26 DIAGNOSIS — E1122 Type 2 diabetes mellitus with diabetic chronic kidney disease: Secondary | ICD-10-CM | POA: Diagnosis not present

## 2023-10-26 DIAGNOSIS — Z9181 History of falling: Secondary | ICD-10-CM | POA: Diagnosis not present

## 2023-10-26 DIAGNOSIS — I5022 Chronic systolic (congestive) heart failure: Secondary | ICD-10-CM | POA: Diagnosis not present

## 2023-10-26 DIAGNOSIS — Z96642 Presence of left artificial hip joint: Secondary | ICD-10-CM | POA: Diagnosis not present

## 2023-10-26 DIAGNOSIS — Z7901 Long term (current) use of anticoagulants: Secondary | ICD-10-CM | POA: Diagnosis not present

## 2023-10-26 DIAGNOSIS — N189 Chronic kidney disease, unspecified: Secondary | ICD-10-CM | POA: Diagnosis not present

## 2023-10-26 DIAGNOSIS — I951 Orthostatic hypotension: Secondary | ICD-10-CM | POA: Diagnosis not present

## 2023-10-26 DIAGNOSIS — E7800 Pure hypercholesterolemia, unspecified: Secondary | ICD-10-CM | POA: Diagnosis not present

## 2023-10-26 DIAGNOSIS — I429 Cardiomyopathy, unspecified: Secondary | ICD-10-CM | POA: Diagnosis not present

## 2023-10-26 DIAGNOSIS — F03B0 Unspecified dementia, moderate, without behavioral disturbance, psychotic disturbance, mood disturbance, and anxiety: Secondary | ICD-10-CM | POA: Diagnosis not present

## 2023-11-01 DIAGNOSIS — E1165 Type 2 diabetes mellitus with hyperglycemia: Secondary | ICD-10-CM | POA: Diagnosis not present

## 2023-11-01 DIAGNOSIS — N179 Acute kidney failure, unspecified: Secondary | ICD-10-CM | POA: Diagnosis not present

## 2023-11-01 DIAGNOSIS — G309 Alzheimer's disease, unspecified: Secondary | ICD-10-CM | POA: Diagnosis not present

## 2023-11-01 DIAGNOSIS — E1122 Type 2 diabetes mellitus with diabetic chronic kidney disease: Secondary | ICD-10-CM | POA: Diagnosis not present

## 2023-11-01 DIAGNOSIS — N184 Chronic kidney disease, stage 4 (severe): Secondary | ICD-10-CM | POA: Diagnosis not present

## 2023-11-01 DIAGNOSIS — I130 Hypertensive heart and chronic kidney disease with heart failure and stage 1 through stage 4 chronic kidney disease, or unspecified chronic kidney disease: Secondary | ICD-10-CM | POA: Diagnosis not present

## 2023-11-01 DIAGNOSIS — I5022 Chronic systolic (congestive) heart failure: Secondary | ICD-10-CM | POA: Diagnosis not present

## 2023-11-02 DIAGNOSIS — Z860101 Personal history of adenomatous and serrated colon polyps: Secondary | ICD-10-CM | POA: Diagnosis not present

## 2023-11-02 DIAGNOSIS — K5900 Constipation, unspecified: Secondary | ICD-10-CM | POA: Diagnosis not present

## 2023-11-02 DIAGNOSIS — M47814 Spondylosis without myelopathy or radiculopathy, thoracic region: Secondary | ICD-10-CM | POA: Diagnosis not present

## 2023-11-02 DIAGNOSIS — I429 Cardiomyopathy, unspecified: Secondary | ICD-10-CM | POA: Diagnosis not present

## 2023-11-02 DIAGNOSIS — Z96642 Presence of left artificial hip joint: Secondary | ICD-10-CM | POA: Diagnosis not present

## 2023-11-02 DIAGNOSIS — N179 Acute kidney failure, unspecified: Secondary | ICD-10-CM | POA: Diagnosis not present

## 2023-11-02 DIAGNOSIS — Z794 Long term (current) use of insulin: Secondary | ICD-10-CM | POA: Diagnosis not present

## 2023-11-02 DIAGNOSIS — F32A Depression, unspecified: Secondary | ICD-10-CM | POA: Diagnosis not present

## 2023-11-02 DIAGNOSIS — I5022 Chronic systolic (congestive) heart failure: Secondary | ICD-10-CM | POA: Diagnosis not present

## 2023-11-02 DIAGNOSIS — N184 Chronic kidney disease, stage 4 (severe): Secondary | ICD-10-CM | POA: Diagnosis not present

## 2023-11-02 DIAGNOSIS — H9193 Unspecified hearing loss, bilateral: Secondary | ICD-10-CM | POA: Diagnosis not present

## 2023-11-02 DIAGNOSIS — I951 Orthostatic hypotension: Secondary | ICD-10-CM | POA: Diagnosis not present

## 2023-11-02 DIAGNOSIS — Z7901 Long term (current) use of anticoagulants: Secondary | ICD-10-CM | POA: Diagnosis not present

## 2023-11-02 DIAGNOSIS — I130 Hypertensive heart and chronic kidney disease with heart failure and stage 1 through stage 4 chronic kidney disease, or unspecified chronic kidney disease: Secondary | ICD-10-CM | POA: Diagnosis not present

## 2023-11-02 DIAGNOSIS — E1165 Type 2 diabetes mellitus with hyperglycemia: Secondary | ICD-10-CM | POA: Diagnosis not present

## 2023-11-02 DIAGNOSIS — E1122 Type 2 diabetes mellitus with diabetic chronic kidney disease: Secondary | ICD-10-CM | POA: Diagnosis not present

## 2023-11-02 DIAGNOSIS — F02B18 Dementia in other diseases classified elsewhere, moderate, with other behavioral disturbance: Secondary | ICD-10-CM | POA: Diagnosis not present

## 2023-11-02 DIAGNOSIS — F02B3 Dementia in other diseases classified elsewhere, moderate, with mood disturbance: Secondary | ICD-10-CM | POA: Diagnosis not present

## 2023-11-02 DIAGNOSIS — I480 Paroxysmal atrial fibrillation: Secondary | ICD-10-CM | POA: Diagnosis not present

## 2023-11-02 DIAGNOSIS — N1330 Unspecified hydronephrosis: Secondary | ICD-10-CM | POA: Diagnosis not present

## 2023-11-02 DIAGNOSIS — Z87891 Personal history of nicotine dependence: Secondary | ICD-10-CM | POA: Diagnosis not present

## 2023-11-02 DIAGNOSIS — G479 Sleep disorder, unspecified: Secondary | ICD-10-CM | POA: Diagnosis not present

## 2023-11-02 DIAGNOSIS — G309 Alzheimer's disease, unspecified: Secondary | ICD-10-CM | POA: Diagnosis not present

## 2023-11-02 DIAGNOSIS — E7800 Pure hypercholesterolemia, unspecified: Secondary | ICD-10-CM | POA: Diagnosis not present

## 2023-11-02 DIAGNOSIS — M1990 Unspecified osteoarthritis, unspecified site: Secondary | ICD-10-CM | POA: Diagnosis not present

## 2023-11-04 DIAGNOSIS — I872 Venous insufficiency (chronic) (peripheral): Secondary | ICD-10-CM | POA: Diagnosis not present

## 2023-11-04 DIAGNOSIS — Z794 Long term (current) use of insulin: Secondary | ICD-10-CM | POA: Diagnosis not present

## 2023-11-04 DIAGNOSIS — E119 Type 2 diabetes mellitus without complications: Secondary | ICD-10-CM | POA: Diagnosis not present

## 2023-11-07 DIAGNOSIS — I480 Paroxysmal atrial fibrillation: Secondary | ICD-10-CM | POA: Diagnosis not present

## 2023-11-09 DIAGNOSIS — I480 Paroxysmal atrial fibrillation: Secondary | ICD-10-CM | POA: Diagnosis not present

## 2023-11-09 DIAGNOSIS — Z794 Long term (current) use of insulin: Secondary | ICD-10-CM | POA: Diagnosis not present

## 2023-11-09 DIAGNOSIS — F02B18 Dementia in other diseases classified elsewhere, moderate, with other behavioral disturbance: Secondary | ICD-10-CM | POA: Diagnosis not present

## 2023-11-09 DIAGNOSIS — Z7901 Long term (current) use of anticoagulants: Secondary | ICD-10-CM | POA: Diagnosis not present

## 2023-11-09 DIAGNOSIS — G479 Sleep disorder, unspecified: Secondary | ICD-10-CM | POA: Diagnosis not present

## 2023-11-09 DIAGNOSIS — F32A Depression, unspecified: Secondary | ICD-10-CM | POA: Diagnosis not present

## 2023-11-09 DIAGNOSIS — I951 Orthostatic hypotension: Secondary | ICD-10-CM | POA: Diagnosis not present

## 2023-11-09 DIAGNOSIS — M47814 Spondylosis without myelopathy or radiculopathy, thoracic region: Secondary | ICD-10-CM | POA: Diagnosis not present

## 2023-11-09 DIAGNOSIS — K5900 Constipation, unspecified: Secondary | ICD-10-CM | POA: Diagnosis not present

## 2023-11-09 DIAGNOSIS — N1330 Unspecified hydronephrosis: Secondary | ICD-10-CM | POA: Diagnosis not present

## 2023-11-09 DIAGNOSIS — I429 Cardiomyopathy, unspecified: Secondary | ICD-10-CM | POA: Diagnosis not present

## 2023-11-17 DIAGNOSIS — R809 Proteinuria, unspecified: Secondary | ICD-10-CM | POA: Diagnosis not present

## 2023-11-17 DIAGNOSIS — D631 Anemia in chronic kidney disease: Secondary | ICD-10-CM | POA: Diagnosis not present

## 2023-11-17 DIAGNOSIS — E1122 Type 2 diabetes mellitus with diabetic chronic kidney disease: Secondary | ICD-10-CM | POA: Diagnosis not present

## 2023-11-17 DIAGNOSIS — I10 Essential (primary) hypertension: Secondary | ICD-10-CM | POA: Diagnosis not present

## 2023-11-17 DIAGNOSIS — N2581 Secondary hyperparathyroidism of renal origin: Secondary | ICD-10-CM | POA: Diagnosis not present

## 2023-11-17 DIAGNOSIS — N184 Chronic kidney disease, stage 4 (severe): Secondary | ICD-10-CM | POA: Diagnosis not present

## 2023-11-23 DIAGNOSIS — Z87891 Personal history of nicotine dependence: Secondary | ICD-10-CM | POA: Diagnosis not present

## 2023-11-23 DIAGNOSIS — G479 Sleep disorder, unspecified: Secondary | ICD-10-CM | POA: Diagnosis not present

## 2023-11-23 DIAGNOSIS — I5022 Chronic systolic (congestive) heart failure: Secondary | ICD-10-CM | POA: Diagnosis not present

## 2023-11-23 DIAGNOSIS — F02B18 Dementia in other diseases classified elsewhere, moderate, with other behavioral disturbance: Secondary | ICD-10-CM | POA: Diagnosis not present

## 2023-11-23 DIAGNOSIS — I130 Hypertensive heart and chronic kidney disease with heart failure and stage 1 through stage 4 chronic kidney disease, or unspecified chronic kidney disease: Secondary | ICD-10-CM | POA: Diagnosis not present

## 2023-11-23 DIAGNOSIS — M1990 Unspecified osteoarthritis, unspecified site: Secondary | ICD-10-CM | POA: Diagnosis not present

## 2023-11-23 DIAGNOSIS — I429 Cardiomyopathy, unspecified: Secondary | ICD-10-CM | POA: Diagnosis not present

## 2023-11-23 DIAGNOSIS — E7800 Pure hypercholesterolemia, unspecified: Secondary | ICD-10-CM | POA: Diagnosis not present

## 2023-11-23 DIAGNOSIS — G309 Alzheimer's disease, unspecified: Secondary | ICD-10-CM | POA: Diagnosis not present

## 2023-11-23 DIAGNOSIS — F32A Depression, unspecified: Secondary | ICD-10-CM | POA: Diagnosis not present

## 2023-11-23 DIAGNOSIS — Z96642 Presence of left artificial hip joint: Secondary | ICD-10-CM | POA: Diagnosis not present

## 2023-11-23 DIAGNOSIS — Z7901 Long term (current) use of anticoagulants: Secondary | ICD-10-CM | POA: Diagnosis not present

## 2023-11-23 DIAGNOSIS — N179 Acute kidney failure, unspecified: Secondary | ICD-10-CM | POA: Diagnosis not present

## 2023-11-23 DIAGNOSIS — F02B3 Dementia in other diseases classified elsewhere, moderate, with mood disturbance: Secondary | ICD-10-CM | POA: Diagnosis not present

## 2023-11-23 DIAGNOSIS — E1165 Type 2 diabetes mellitus with hyperglycemia: Secondary | ICD-10-CM | POA: Diagnosis not present

## 2023-11-23 DIAGNOSIS — I480 Paroxysmal atrial fibrillation: Secondary | ICD-10-CM | POA: Diagnosis not present

## 2023-11-23 DIAGNOSIS — Z860101 Personal history of adenomatous and serrated colon polyps: Secondary | ICD-10-CM | POA: Diagnosis not present

## 2023-11-23 DIAGNOSIS — Z794 Long term (current) use of insulin: Secondary | ICD-10-CM | POA: Diagnosis not present

## 2023-11-23 DIAGNOSIS — I951 Orthostatic hypotension: Secondary | ICD-10-CM | POA: Diagnosis not present

## 2023-11-23 DIAGNOSIS — E1122 Type 2 diabetes mellitus with diabetic chronic kidney disease: Secondary | ICD-10-CM | POA: Diagnosis not present

## 2023-11-23 DIAGNOSIS — H9193 Unspecified hearing loss, bilateral: Secondary | ICD-10-CM | POA: Diagnosis not present

## 2023-11-23 DIAGNOSIS — N1330 Unspecified hydronephrosis: Secondary | ICD-10-CM | POA: Diagnosis not present

## 2023-11-23 DIAGNOSIS — K5900 Constipation, unspecified: Secondary | ICD-10-CM | POA: Diagnosis not present

## 2023-11-23 DIAGNOSIS — N184 Chronic kidney disease, stage 4 (severe): Secondary | ICD-10-CM | POA: Diagnosis not present

## 2023-11-23 DIAGNOSIS — M47814 Spondylosis without myelopathy or radiculopathy, thoracic region: Secondary | ICD-10-CM | POA: Diagnosis not present

## 2023-11-28 DIAGNOSIS — E7800 Pure hypercholesterolemia, unspecified: Secondary | ICD-10-CM | POA: Diagnosis not present

## 2023-11-28 DIAGNOSIS — Z794 Long term (current) use of insulin: Secondary | ICD-10-CM | POA: Diagnosis not present

## 2023-11-28 DIAGNOSIS — F02B18 Dementia in other diseases classified elsewhere, moderate, with other behavioral disturbance: Secondary | ICD-10-CM | POA: Diagnosis not present

## 2023-11-28 DIAGNOSIS — M47814 Spondylosis without myelopathy or radiculopathy, thoracic region: Secondary | ICD-10-CM | POA: Diagnosis not present

## 2023-11-28 DIAGNOSIS — H9193 Unspecified hearing loss, bilateral: Secondary | ICD-10-CM | POA: Diagnosis not present

## 2023-11-28 DIAGNOSIS — E1165 Type 2 diabetes mellitus with hyperglycemia: Secondary | ICD-10-CM | POA: Diagnosis not present

## 2023-11-28 DIAGNOSIS — I5022 Chronic systolic (congestive) heart failure: Secondary | ICD-10-CM | POA: Diagnosis not present

## 2023-11-28 DIAGNOSIS — G479 Sleep disorder, unspecified: Secondary | ICD-10-CM | POA: Diagnosis not present

## 2023-11-28 DIAGNOSIS — I480 Paroxysmal atrial fibrillation: Secondary | ICD-10-CM | POA: Diagnosis not present

## 2023-11-28 DIAGNOSIS — N1330 Unspecified hydronephrosis: Secondary | ICD-10-CM | POA: Diagnosis not present

## 2023-11-28 DIAGNOSIS — M1990 Unspecified osteoarthritis, unspecified site: Secondary | ICD-10-CM | POA: Diagnosis not present

## 2023-11-28 DIAGNOSIS — F02B3 Dementia in other diseases classified elsewhere, moderate, with mood disturbance: Secondary | ICD-10-CM | POA: Diagnosis not present

## 2023-11-28 DIAGNOSIS — Z96642 Presence of left artificial hip joint: Secondary | ICD-10-CM | POA: Diagnosis not present

## 2023-11-28 DIAGNOSIS — G309 Alzheimer's disease, unspecified: Secondary | ICD-10-CM | POA: Diagnosis not present

## 2023-11-28 DIAGNOSIS — F32A Depression, unspecified: Secondary | ICD-10-CM | POA: Diagnosis not present

## 2023-11-28 DIAGNOSIS — I951 Orthostatic hypotension: Secondary | ICD-10-CM | POA: Diagnosis not present

## 2023-11-28 DIAGNOSIS — K5900 Constipation, unspecified: Secondary | ICD-10-CM | POA: Diagnosis not present

## 2023-11-28 DIAGNOSIS — Z7901 Long term (current) use of anticoagulants: Secondary | ICD-10-CM | POA: Diagnosis not present

## 2023-11-28 DIAGNOSIS — Z860101 Personal history of adenomatous and serrated colon polyps: Secondary | ICD-10-CM | POA: Diagnosis not present

## 2023-11-28 DIAGNOSIS — N179 Acute kidney failure, unspecified: Secondary | ICD-10-CM | POA: Diagnosis not present

## 2023-11-28 DIAGNOSIS — I130 Hypertensive heart and chronic kidney disease with heart failure and stage 1 through stage 4 chronic kidney disease, or unspecified chronic kidney disease: Secondary | ICD-10-CM | POA: Diagnosis not present

## 2023-11-28 DIAGNOSIS — Z87891 Personal history of nicotine dependence: Secondary | ICD-10-CM | POA: Diagnosis not present

## 2023-11-28 DIAGNOSIS — E1122 Type 2 diabetes mellitus with diabetic chronic kidney disease: Secondary | ICD-10-CM | POA: Diagnosis not present

## 2023-11-28 DIAGNOSIS — I429 Cardiomyopathy, unspecified: Secondary | ICD-10-CM | POA: Diagnosis not present

## 2023-11-28 DIAGNOSIS — N184 Chronic kidney disease, stage 4 (severe): Secondary | ICD-10-CM | POA: Diagnosis not present

## 2023-12-01 DIAGNOSIS — M1990 Unspecified osteoarthritis, unspecified site: Secondary | ICD-10-CM | POA: Diagnosis not present

## 2023-12-01 DIAGNOSIS — G309 Alzheimer's disease, unspecified: Secondary | ICD-10-CM | POA: Diagnosis not present

## 2023-12-01 DIAGNOSIS — E1165 Type 2 diabetes mellitus with hyperglycemia: Secondary | ICD-10-CM | POA: Diagnosis not present

## 2023-12-01 DIAGNOSIS — F02B3 Dementia in other diseases classified elsewhere, moderate, with mood disturbance: Secondary | ICD-10-CM | POA: Diagnosis not present

## 2023-12-01 DIAGNOSIS — K5900 Constipation, unspecified: Secondary | ICD-10-CM | POA: Diagnosis not present

## 2023-12-01 DIAGNOSIS — Z860101 Personal history of adenomatous and serrated colon polyps: Secondary | ICD-10-CM | POA: Diagnosis not present

## 2023-12-01 DIAGNOSIS — I480 Paroxysmal atrial fibrillation: Secondary | ICD-10-CM | POA: Diagnosis not present

## 2023-12-01 DIAGNOSIS — Z96642 Presence of left artificial hip joint: Secondary | ICD-10-CM | POA: Diagnosis not present

## 2023-12-01 DIAGNOSIS — F02B18 Dementia in other diseases classified elsewhere, moderate, with other behavioral disturbance: Secondary | ICD-10-CM | POA: Diagnosis not present

## 2023-12-01 DIAGNOSIS — F32A Depression, unspecified: Secondary | ICD-10-CM | POA: Diagnosis not present

## 2023-12-01 DIAGNOSIS — I5022 Chronic systolic (congestive) heart failure: Secondary | ICD-10-CM | POA: Diagnosis not present

## 2023-12-01 DIAGNOSIS — M47814 Spondylosis without myelopathy or radiculopathy, thoracic region: Secondary | ICD-10-CM | POA: Diagnosis not present

## 2023-12-01 DIAGNOSIS — I951 Orthostatic hypotension: Secondary | ICD-10-CM | POA: Diagnosis not present

## 2023-12-01 DIAGNOSIS — Z794 Long term (current) use of insulin: Secondary | ICD-10-CM | POA: Diagnosis not present

## 2023-12-01 DIAGNOSIS — N179 Acute kidney failure, unspecified: Secondary | ICD-10-CM | POA: Diagnosis not present

## 2023-12-01 DIAGNOSIS — E7800 Pure hypercholesterolemia, unspecified: Secondary | ICD-10-CM | POA: Diagnosis not present

## 2023-12-01 DIAGNOSIS — Z87891 Personal history of nicotine dependence: Secondary | ICD-10-CM | POA: Diagnosis not present

## 2023-12-01 DIAGNOSIS — N184 Chronic kidney disease, stage 4 (severe): Secondary | ICD-10-CM | POA: Diagnosis not present

## 2023-12-01 DIAGNOSIS — I429 Cardiomyopathy, unspecified: Secondary | ICD-10-CM | POA: Diagnosis not present

## 2023-12-01 DIAGNOSIS — G479 Sleep disorder, unspecified: Secondary | ICD-10-CM | POA: Diagnosis not present

## 2023-12-01 DIAGNOSIS — Z7901 Long term (current) use of anticoagulants: Secondary | ICD-10-CM | POA: Diagnosis not present

## 2023-12-01 DIAGNOSIS — H9193 Unspecified hearing loss, bilateral: Secondary | ICD-10-CM | POA: Diagnosis not present

## 2023-12-01 DIAGNOSIS — E1122 Type 2 diabetes mellitus with diabetic chronic kidney disease: Secondary | ICD-10-CM | POA: Diagnosis not present

## 2023-12-01 DIAGNOSIS — I130 Hypertensive heart and chronic kidney disease with heart failure and stage 1 through stage 4 chronic kidney disease, or unspecified chronic kidney disease: Secondary | ICD-10-CM | POA: Diagnosis not present

## 2023-12-01 DIAGNOSIS — N1330 Unspecified hydronephrosis: Secondary | ICD-10-CM | POA: Diagnosis not present

## 2023-12-02 DIAGNOSIS — Z794 Long term (current) use of insulin: Secondary | ICD-10-CM | POA: Diagnosis not present

## 2023-12-02 DIAGNOSIS — E1122 Type 2 diabetes mellitus with diabetic chronic kidney disease: Secondary | ICD-10-CM | POA: Diagnosis not present

## 2023-12-02 DIAGNOSIS — E7800 Pure hypercholesterolemia, unspecified: Secondary | ICD-10-CM | POA: Diagnosis not present

## 2023-12-02 DIAGNOSIS — M1990 Unspecified osteoarthritis, unspecified site: Secondary | ICD-10-CM | POA: Diagnosis not present

## 2023-12-02 DIAGNOSIS — N179 Acute kidney failure, unspecified: Secondary | ICD-10-CM | POA: Diagnosis not present

## 2023-12-02 DIAGNOSIS — N1330 Unspecified hydronephrosis: Secondary | ICD-10-CM | POA: Diagnosis not present

## 2023-12-02 DIAGNOSIS — I951 Orthostatic hypotension: Secondary | ICD-10-CM | POA: Diagnosis not present

## 2023-12-02 DIAGNOSIS — N184 Chronic kidney disease, stage 4 (severe): Secondary | ICD-10-CM | POA: Diagnosis not present

## 2023-12-02 DIAGNOSIS — I5022 Chronic systolic (congestive) heart failure: Secondary | ICD-10-CM | POA: Diagnosis not present

## 2023-12-02 DIAGNOSIS — E1165 Type 2 diabetes mellitus with hyperglycemia: Secondary | ICD-10-CM | POA: Diagnosis not present

## 2023-12-02 DIAGNOSIS — Z87891 Personal history of nicotine dependence: Secondary | ICD-10-CM | POA: Diagnosis not present

## 2023-12-02 DIAGNOSIS — M47814 Spondylosis without myelopathy or radiculopathy, thoracic region: Secondary | ICD-10-CM | POA: Diagnosis not present

## 2023-12-02 DIAGNOSIS — Z860101 Personal history of adenomatous and serrated colon polyps: Secondary | ICD-10-CM | POA: Diagnosis not present

## 2023-12-02 DIAGNOSIS — I480 Paroxysmal atrial fibrillation: Secondary | ICD-10-CM | POA: Diagnosis not present

## 2023-12-02 DIAGNOSIS — I429 Cardiomyopathy, unspecified: Secondary | ICD-10-CM | POA: Diagnosis not present

## 2023-12-02 DIAGNOSIS — F32A Depression, unspecified: Secondary | ICD-10-CM | POA: Diagnosis not present

## 2023-12-02 DIAGNOSIS — G479 Sleep disorder, unspecified: Secondary | ICD-10-CM | POA: Diagnosis not present

## 2023-12-02 DIAGNOSIS — K5900 Constipation, unspecified: Secondary | ICD-10-CM | POA: Diagnosis not present

## 2023-12-02 DIAGNOSIS — Z7901 Long term (current) use of anticoagulants: Secondary | ICD-10-CM | POA: Diagnosis not present

## 2023-12-02 DIAGNOSIS — H9193 Unspecified hearing loss, bilateral: Secondary | ICD-10-CM | POA: Diagnosis not present

## 2023-12-02 DIAGNOSIS — F02B18 Dementia in other diseases classified elsewhere, moderate, with other behavioral disturbance: Secondary | ICD-10-CM | POA: Diagnosis not present

## 2023-12-02 DIAGNOSIS — I130 Hypertensive heart and chronic kidney disease with heart failure and stage 1 through stage 4 chronic kidney disease, or unspecified chronic kidney disease: Secondary | ICD-10-CM | POA: Diagnosis not present

## 2023-12-02 DIAGNOSIS — F02B3 Dementia in other diseases classified elsewhere, moderate, with mood disturbance: Secondary | ICD-10-CM | POA: Diagnosis not present

## 2023-12-02 DIAGNOSIS — G309 Alzheimer's disease, unspecified: Secondary | ICD-10-CM | POA: Diagnosis not present

## 2023-12-02 DIAGNOSIS — Z96642 Presence of left artificial hip joint: Secondary | ICD-10-CM | POA: Diagnosis not present

## 2023-12-07 DIAGNOSIS — E1165 Type 2 diabetes mellitus with hyperglycemia: Secondary | ICD-10-CM | POA: Diagnosis not present

## 2023-12-14 DIAGNOSIS — I5022 Chronic systolic (congestive) heart failure: Secondary | ICD-10-CM | POA: Diagnosis not present

## 2023-12-14 DIAGNOSIS — F02B3 Dementia in other diseases classified elsewhere, moderate, with mood disturbance: Secondary | ICD-10-CM | POA: Diagnosis not present

## 2023-12-14 DIAGNOSIS — I130 Hypertensive heart and chronic kidney disease with heart failure and stage 1 through stage 4 chronic kidney disease, or unspecified chronic kidney disease: Secondary | ICD-10-CM | POA: Diagnosis not present

## 2023-12-14 DIAGNOSIS — Z794 Long term (current) use of insulin: Secondary | ICD-10-CM | POA: Diagnosis not present

## 2023-12-14 DIAGNOSIS — Z860101 Personal history of adenomatous and serrated colon polyps: Secondary | ICD-10-CM | POA: Diagnosis not present

## 2023-12-14 DIAGNOSIS — N184 Chronic kidney disease, stage 4 (severe): Secondary | ICD-10-CM | POA: Diagnosis not present

## 2023-12-14 DIAGNOSIS — E7800 Pure hypercholesterolemia, unspecified: Secondary | ICD-10-CM | POA: Diagnosis not present

## 2023-12-14 DIAGNOSIS — N2581 Secondary hyperparathyroidism of renal origin: Secondary | ICD-10-CM | POA: Diagnosis not present

## 2023-12-14 DIAGNOSIS — I951 Orthostatic hypotension: Secondary | ICD-10-CM | POA: Diagnosis not present

## 2023-12-14 DIAGNOSIS — Z96642 Presence of left artificial hip joint: Secondary | ICD-10-CM | POA: Diagnosis not present

## 2023-12-14 DIAGNOSIS — H9193 Unspecified hearing loss, bilateral: Secondary | ICD-10-CM | POA: Diagnosis not present

## 2023-12-14 DIAGNOSIS — Z7901 Long term (current) use of anticoagulants: Secondary | ICD-10-CM | POA: Diagnosis not present

## 2023-12-14 DIAGNOSIS — I10 Essential (primary) hypertension: Secondary | ICD-10-CM | POA: Diagnosis not present

## 2023-12-14 DIAGNOSIS — I429 Cardiomyopathy, unspecified: Secondary | ICD-10-CM | POA: Diagnosis not present

## 2023-12-14 DIAGNOSIS — E1165 Type 2 diabetes mellitus with hyperglycemia: Secondary | ICD-10-CM | POA: Diagnosis not present

## 2023-12-14 DIAGNOSIS — K5900 Constipation, unspecified: Secondary | ICD-10-CM | POA: Diagnosis not present

## 2023-12-14 DIAGNOSIS — I480 Paroxysmal atrial fibrillation: Secondary | ICD-10-CM | POA: Diagnosis not present

## 2023-12-14 DIAGNOSIS — G479 Sleep disorder, unspecified: Secondary | ICD-10-CM | POA: Diagnosis not present

## 2023-12-14 DIAGNOSIS — R809 Proteinuria, unspecified: Secondary | ICD-10-CM | POA: Diagnosis not present

## 2023-12-14 DIAGNOSIS — Z87891 Personal history of nicotine dependence: Secondary | ICD-10-CM | POA: Diagnosis not present

## 2023-12-14 DIAGNOSIS — D631 Anemia in chronic kidney disease: Secondary | ICD-10-CM | POA: Diagnosis not present

## 2023-12-14 DIAGNOSIS — N179 Acute kidney failure, unspecified: Secondary | ICD-10-CM | POA: Diagnosis not present

## 2023-12-14 DIAGNOSIS — F32A Depression, unspecified: Secondary | ICD-10-CM | POA: Diagnosis not present

## 2023-12-14 DIAGNOSIS — M47814 Spondylosis without myelopathy or radiculopathy, thoracic region: Secondary | ICD-10-CM | POA: Diagnosis not present

## 2023-12-14 DIAGNOSIS — N1330 Unspecified hydronephrosis: Secondary | ICD-10-CM | POA: Diagnosis not present

## 2023-12-14 DIAGNOSIS — N185 Chronic kidney disease, stage 5: Secondary | ICD-10-CM | POA: Diagnosis not present

## 2023-12-14 DIAGNOSIS — F02B18 Dementia in other diseases classified elsewhere, moderate, with other behavioral disturbance: Secondary | ICD-10-CM | POA: Diagnosis not present

## 2023-12-14 DIAGNOSIS — E1122 Type 2 diabetes mellitus with diabetic chronic kidney disease: Secondary | ICD-10-CM | POA: Diagnosis not present

## 2023-12-14 DIAGNOSIS — M1990 Unspecified osteoarthritis, unspecified site: Secondary | ICD-10-CM | POA: Diagnosis not present

## 2023-12-14 DIAGNOSIS — G309 Alzheimer's disease, unspecified: Secondary | ICD-10-CM | POA: Diagnosis not present

## 2023-12-21 DIAGNOSIS — I480 Paroxysmal atrial fibrillation: Secondary | ICD-10-CM | POA: Diagnosis not present

## 2023-12-24 DIAGNOSIS — Z87891 Personal history of nicotine dependence: Secondary | ICD-10-CM | POA: Diagnosis not present

## 2023-12-24 DIAGNOSIS — I480 Paroxysmal atrial fibrillation: Secondary | ICD-10-CM | POA: Diagnosis not present

## 2023-12-24 DIAGNOSIS — E1165 Type 2 diabetes mellitus with hyperglycemia: Secondary | ICD-10-CM | POA: Diagnosis not present

## 2023-12-24 DIAGNOSIS — N184 Chronic kidney disease, stage 4 (severe): Secondary | ICD-10-CM | POA: Diagnosis not present

## 2023-12-24 DIAGNOSIS — Z7901 Long term (current) use of anticoagulants: Secondary | ICD-10-CM | POA: Diagnosis not present

## 2023-12-24 DIAGNOSIS — F02B3 Dementia in other diseases classified elsewhere, moderate, with mood disturbance: Secondary | ICD-10-CM | POA: Diagnosis not present

## 2023-12-24 DIAGNOSIS — Z96642 Presence of left artificial hip joint: Secondary | ICD-10-CM | POA: Diagnosis not present

## 2023-12-24 DIAGNOSIS — I429 Cardiomyopathy, unspecified: Secondary | ICD-10-CM | POA: Diagnosis not present

## 2023-12-24 DIAGNOSIS — Z860101 Personal history of adenomatous and serrated colon polyps: Secondary | ICD-10-CM | POA: Diagnosis not present

## 2023-12-24 DIAGNOSIS — M1990 Unspecified osteoarthritis, unspecified site: Secondary | ICD-10-CM | POA: Diagnosis not present

## 2023-12-24 DIAGNOSIS — H9193 Unspecified hearing loss, bilateral: Secondary | ICD-10-CM | POA: Diagnosis not present

## 2023-12-24 DIAGNOSIS — I130 Hypertensive heart and chronic kidney disease with heart failure and stage 1 through stage 4 chronic kidney disease, or unspecified chronic kidney disease: Secondary | ICD-10-CM | POA: Diagnosis not present

## 2023-12-24 DIAGNOSIS — Z794 Long term (current) use of insulin: Secondary | ICD-10-CM | POA: Diagnosis not present

## 2023-12-24 DIAGNOSIS — K5900 Constipation, unspecified: Secondary | ICD-10-CM | POA: Diagnosis not present

## 2023-12-24 DIAGNOSIS — G309 Alzheimer's disease, unspecified: Secondary | ICD-10-CM | POA: Diagnosis not present

## 2023-12-24 DIAGNOSIS — G479 Sleep disorder, unspecified: Secondary | ICD-10-CM | POA: Diagnosis not present

## 2023-12-24 DIAGNOSIS — M47814 Spondylosis without myelopathy or radiculopathy, thoracic region: Secondary | ICD-10-CM | POA: Diagnosis not present

## 2023-12-24 DIAGNOSIS — E7800 Pure hypercholesterolemia, unspecified: Secondary | ICD-10-CM | POA: Diagnosis not present

## 2023-12-24 DIAGNOSIS — E1122 Type 2 diabetes mellitus with diabetic chronic kidney disease: Secondary | ICD-10-CM | POA: Diagnosis not present

## 2023-12-24 DIAGNOSIS — I951 Orthostatic hypotension: Secondary | ICD-10-CM | POA: Diagnosis not present

## 2023-12-24 DIAGNOSIS — I5022 Chronic systolic (congestive) heart failure: Secondary | ICD-10-CM | POA: Diagnosis not present

## 2023-12-24 DIAGNOSIS — N1330 Unspecified hydronephrosis: Secondary | ICD-10-CM | POA: Diagnosis not present

## 2023-12-24 DIAGNOSIS — F02B18 Dementia in other diseases classified elsewhere, moderate, with other behavioral disturbance: Secondary | ICD-10-CM | POA: Diagnosis not present

## 2023-12-24 DIAGNOSIS — N179 Acute kidney failure, unspecified: Secondary | ICD-10-CM | POA: Diagnosis not present

## 2023-12-24 DIAGNOSIS — F32A Depression, unspecified: Secondary | ICD-10-CM | POA: Diagnosis not present

## 2023-12-26 DIAGNOSIS — Z87891 Personal history of nicotine dependence: Secondary | ICD-10-CM | POA: Diagnosis not present

## 2023-12-26 DIAGNOSIS — I480 Paroxysmal atrial fibrillation: Secondary | ICD-10-CM | POA: Diagnosis not present

## 2023-12-26 DIAGNOSIS — N184 Chronic kidney disease, stage 4 (severe): Secondary | ICD-10-CM | POA: Diagnosis not present

## 2023-12-26 DIAGNOSIS — F02B3 Dementia in other diseases classified elsewhere, moderate, with mood disturbance: Secondary | ICD-10-CM | POA: Diagnosis not present

## 2023-12-26 DIAGNOSIS — I130 Hypertensive heart and chronic kidney disease with heart failure and stage 1 through stage 4 chronic kidney disease, or unspecified chronic kidney disease: Secondary | ICD-10-CM | POA: Diagnosis not present

## 2023-12-26 DIAGNOSIS — Z794 Long term (current) use of insulin: Secondary | ICD-10-CM | POA: Diagnosis not present

## 2023-12-26 DIAGNOSIS — F32A Depression, unspecified: Secondary | ICD-10-CM | POA: Diagnosis not present

## 2023-12-26 DIAGNOSIS — Z7901 Long term (current) use of anticoagulants: Secondary | ICD-10-CM | POA: Diagnosis not present

## 2023-12-26 DIAGNOSIS — G479 Sleep disorder, unspecified: Secondary | ICD-10-CM | POA: Diagnosis not present

## 2023-12-26 DIAGNOSIS — E1165 Type 2 diabetes mellitus with hyperglycemia: Secondary | ICD-10-CM | POA: Diagnosis not present

## 2023-12-26 DIAGNOSIS — M1990 Unspecified osteoarthritis, unspecified site: Secondary | ICD-10-CM | POA: Diagnosis not present

## 2023-12-26 DIAGNOSIS — G309 Alzheimer's disease, unspecified: Secondary | ICD-10-CM | POA: Diagnosis not present

## 2023-12-26 DIAGNOSIS — K5900 Constipation, unspecified: Secondary | ICD-10-CM | POA: Diagnosis not present

## 2023-12-26 DIAGNOSIS — H9193 Unspecified hearing loss, bilateral: Secondary | ICD-10-CM | POA: Diagnosis not present

## 2023-12-26 DIAGNOSIS — N1330 Unspecified hydronephrosis: Secondary | ICD-10-CM | POA: Diagnosis not present

## 2023-12-26 DIAGNOSIS — I5022 Chronic systolic (congestive) heart failure: Secondary | ICD-10-CM | POA: Diagnosis not present

## 2023-12-26 DIAGNOSIS — Z96642 Presence of left artificial hip joint: Secondary | ICD-10-CM | POA: Diagnosis not present

## 2023-12-26 DIAGNOSIS — M47814 Spondylosis without myelopathy or radiculopathy, thoracic region: Secondary | ICD-10-CM | POA: Diagnosis not present

## 2023-12-26 DIAGNOSIS — E7800 Pure hypercholesterolemia, unspecified: Secondary | ICD-10-CM | POA: Diagnosis not present

## 2023-12-26 DIAGNOSIS — I429 Cardiomyopathy, unspecified: Secondary | ICD-10-CM | POA: Diagnosis not present

## 2023-12-26 DIAGNOSIS — Z860101 Personal history of adenomatous and serrated colon polyps: Secondary | ICD-10-CM | POA: Diagnosis not present

## 2023-12-26 DIAGNOSIS — N179 Acute kidney failure, unspecified: Secondary | ICD-10-CM | POA: Diagnosis not present

## 2023-12-26 DIAGNOSIS — F02B18 Dementia in other diseases classified elsewhere, moderate, with other behavioral disturbance: Secondary | ICD-10-CM | POA: Diagnosis not present

## 2023-12-26 DIAGNOSIS — E1122 Type 2 diabetes mellitus with diabetic chronic kidney disease: Secondary | ICD-10-CM | POA: Diagnosis not present

## 2023-12-26 DIAGNOSIS — I951 Orthostatic hypotension: Secondary | ICD-10-CM | POA: Diagnosis not present

## 2023-12-27 DIAGNOSIS — I420 Dilated cardiomyopathy: Secondary | ICD-10-CM | POA: Diagnosis not present

## 2023-12-27 DIAGNOSIS — I5022 Chronic systolic (congestive) heart failure: Secondary | ICD-10-CM | POA: Diagnosis not present

## 2023-12-27 DIAGNOSIS — I4892 Unspecified atrial flutter: Secondary | ICD-10-CM | POA: Diagnosis not present

## 2023-12-27 DIAGNOSIS — I340 Nonrheumatic mitral (valve) insufficiency: Secondary | ICD-10-CM | POA: Diagnosis not present

## 2023-12-27 DIAGNOSIS — E782 Mixed hyperlipidemia: Secondary | ICD-10-CM | POA: Diagnosis not present

## 2023-12-28 DIAGNOSIS — Z7901 Long term (current) use of anticoagulants: Secondary | ICD-10-CM | POA: Diagnosis not present

## 2023-12-28 DIAGNOSIS — K5900 Constipation, unspecified: Secondary | ICD-10-CM | POA: Diagnosis not present

## 2023-12-28 DIAGNOSIS — F32A Depression, unspecified: Secondary | ICD-10-CM | POA: Diagnosis not present

## 2023-12-28 DIAGNOSIS — I130 Hypertensive heart and chronic kidney disease with heart failure and stage 1 through stage 4 chronic kidney disease, or unspecified chronic kidney disease: Secondary | ICD-10-CM | POA: Diagnosis not present

## 2023-12-28 DIAGNOSIS — G479 Sleep disorder, unspecified: Secondary | ICD-10-CM | POA: Diagnosis not present

## 2023-12-28 DIAGNOSIS — I951 Orthostatic hypotension: Secondary | ICD-10-CM | POA: Diagnosis not present

## 2023-12-28 DIAGNOSIS — Z87891 Personal history of nicotine dependence: Secondary | ICD-10-CM | POA: Diagnosis not present

## 2023-12-28 DIAGNOSIS — N179 Acute kidney failure, unspecified: Secondary | ICD-10-CM | POA: Diagnosis not present

## 2023-12-28 DIAGNOSIS — Z794 Long term (current) use of insulin: Secondary | ICD-10-CM | POA: Diagnosis not present

## 2023-12-28 DIAGNOSIS — H9193 Unspecified hearing loss, bilateral: Secondary | ICD-10-CM | POA: Diagnosis not present

## 2023-12-28 DIAGNOSIS — I5022 Chronic systolic (congestive) heart failure: Secondary | ICD-10-CM | POA: Diagnosis not present

## 2023-12-28 DIAGNOSIS — E1165 Type 2 diabetes mellitus with hyperglycemia: Secondary | ICD-10-CM | POA: Diagnosis not present

## 2023-12-28 DIAGNOSIS — E1122 Type 2 diabetes mellitus with diabetic chronic kidney disease: Secondary | ICD-10-CM | POA: Diagnosis not present

## 2023-12-28 DIAGNOSIS — M1990 Unspecified osteoarthritis, unspecified site: Secondary | ICD-10-CM | POA: Diagnosis not present

## 2023-12-28 DIAGNOSIS — M47814 Spondylosis without myelopathy or radiculopathy, thoracic region: Secondary | ICD-10-CM | POA: Diagnosis not present

## 2023-12-28 DIAGNOSIS — N1330 Unspecified hydronephrosis: Secondary | ICD-10-CM | POA: Diagnosis not present

## 2023-12-28 DIAGNOSIS — F02B18 Dementia in other diseases classified elsewhere, moderate, with other behavioral disturbance: Secondary | ICD-10-CM | POA: Diagnosis not present

## 2023-12-28 DIAGNOSIS — I429 Cardiomyopathy, unspecified: Secondary | ICD-10-CM | POA: Diagnosis not present

## 2023-12-28 DIAGNOSIS — Z96642 Presence of left artificial hip joint: Secondary | ICD-10-CM | POA: Diagnosis not present

## 2023-12-28 DIAGNOSIS — Z860101 Personal history of adenomatous and serrated colon polyps: Secondary | ICD-10-CM | POA: Diagnosis not present

## 2023-12-28 DIAGNOSIS — N184 Chronic kidney disease, stage 4 (severe): Secondary | ICD-10-CM | POA: Diagnosis not present

## 2023-12-28 DIAGNOSIS — I480 Paroxysmal atrial fibrillation: Secondary | ICD-10-CM | POA: Diagnosis not present

## 2023-12-28 DIAGNOSIS — F02B3 Dementia in other diseases classified elsewhere, moderate, with mood disturbance: Secondary | ICD-10-CM | POA: Diagnosis not present

## 2023-12-28 DIAGNOSIS — E7800 Pure hypercholesterolemia, unspecified: Secondary | ICD-10-CM | POA: Diagnosis not present

## 2023-12-28 DIAGNOSIS — G309 Alzheimer's disease, unspecified: Secondary | ICD-10-CM | POA: Diagnosis not present

## 2023-12-30 DIAGNOSIS — E1122 Type 2 diabetes mellitus with diabetic chronic kidney disease: Secondary | ICD-10-CM | POA: Diagnosis not present

## 2023-12-30 DIAGNOSIS — D692 Other nonthrombocytopenic purpura: Secondary | ICD-10-CM | POA: Diagnosis not present

## 2023-12-30 DIAGNOSIS — N186 End stage renal disease: Secondary | ICD-10-CM | POA: Diagnosis not present

## 2023-12-30 DIAGNOSIS — K5900 Constipation, unspecified: Secondary | ICD-10-CM | POA: Diagnosis not present

## 2023-12-30 DIAGNOSIS — Z7189 Other specified counseling: Secondary | ICD-10-CM | POA: Diagnosis not present

## 2023-12-30 DIAGNOSIS — Z794 Long term (current) use of insulin: Secondary | ICD-10-CM | POA: Diagnosis not present

## 2023-12-30 DIAGNOSIS — Z978 Presence of other specified devices: Secondary | ICD-10-CM | POA: Diagnosis not present

## 2023-12-31 DIAGNOSIS — E1165 Type 2 diabetes mellitus with hyperglycemia: Secondary | ICD-10-CM | POA: Diagnosis not present

## 2024-01-04 DIAGNOSIS — M47814 Spondylosis without myelopathy or radiculopathy, thoracic region: Secondary | ICD-10-CM | POA: Diagnosis not present

## 2024-01-04 DIAGNOSIS — N1330 Unspecified hydronephrosis: Secondary | ICD-10-CM | POA: Diagnosis not present

## 2024-01-04 DIAGNOSIS — Z87891 Personal history of nicotine dependence: Secondary | ICD-10-CM | POA: Diagnosis not present

## 2024-01-04 DIAGNOSIS — G479 Sleep disorder, unspecified: Secondary | ICD-10-CM | POA: Diagnosis not present

## 2024-01-04 DIAGNOSIS — Z794 Long term (current) use of insulin: Secondary | ICD-10-CM | POA: Diagnosis not present

## 2024-01-04 DIAGNOSIS — Z7901 Long term (current) use of anticoagulants: Secondary | ICD-10-CM | POA: Diagnosis not present

## 2024-01-04 DIAGNOSIS — Z79899 Other long term (current) drug therapy: Secondary | ICD-10-CM | POA: Diagnosis not present

## 2024-01-04 DIAGNOSIS — I429 Cardiomyopathy, unspecified: Secondary | ICD-10-CM | POA: Diagnosis not present

## 2024-01-04 DIAGNOSIS — F02B3 Dementia in other diseases classified elsewhere, moderate, with mood disturbance: Secondary | ICD-10-CM | POA: Diagnosis not present

## 2024-01-04 DIAGNOSIS — F02B18 Dementia in other diseases classified elsewhere, moderate, with other behavioral disturbance: Secondary | ICD-10-CM | POA: Diagnosis not present

## 2024-01-04 DIAGNOSIS — I5022 Chronic systolic (congestive) heart failure: Secondary | ICD-10-CM | POA: Diagnosis not present

## 2024-01-04 DIAGNOSIS — M1990 Unspecified osteoarthritis, unspecified site: Secondary | ICD-10-CM | POA: Diagnosis not present

## 2024-01-04 DIAGNOSIS — E7800 Pure hypercholesterolemia, unspecified: Secondary | ICD-10-CM | POA: Diagnosis not present

## 2024-01-04 DIAGNOSIS — Z860101 Personal history of adenomatous and serrated colon polyps: Secondary | ICD-10-CM | POA: Diagnosis not present

## 2024-01-04 DIAGNOSIS — G309 Alzheimer's disease, unspecified: Secondary | ICD-10-CM | POA: Diagnosis not present

## 2024-01-04 DIAGNOSIS — I951 Orthostatic hypotension: Secondary | ICD-10-CM | POA: Diagnosis not present

## 2024-01-04 DIAGNOSIS — E1165 Type 2 diabetes mellitus with hyperglycemia: Secondary | ICD-10-CM | POA: Diagnosis not present

## 2024-01-04 DIAGNOSIS — E1122 Type 2 diabetes mellitus with diabetic chronic kidney disease: Secondary | ICD-10-CM | POA: Diagnosis not present

## 2024-01-04 DIAGNOSIS — Z96642 Presence of left artificial hip joint: Secondary | ICD-10-CM | POA: Diagnosis not present

## 2024-01-04 DIAGNOSIS — I480 Paroxysmal atrial fibrillation: Secondary | ICD-10-CM | POA: Diagnosis not present

## 2024-01-04 DIAGNOSIS — N184 Chronic kidney disease, stage 4 (severe): Secondary | ICD-10-CM | POA: Diagnosis not present

## 2024-01-04 DIAGNOSIS — K5900 Constipation, unspecified: Secondary | ICD-10-CM | POA: Diagnosis not present

## 2024-01-04 DIAGNOSIS — I130 Hypertensive heart and chronic kidney disease with heart failure and stage 1 through stage 4 chronic kidney disease, or unspecified chronic kidney disease: Secondary | ICD-10-CM | POA: Diagnosis not present

## 2024-01-04 DIAGNOSIS — F32A Depression, unspecified: Secondary | ICD-10-CM | POA: Diagnosis not present

## 2024-01-04 DIAGNOSIS — H9193 Unspecified hearing loss, bilateral: Secondary | ICD-10-CM | POA: Diagnosis not present

## 2024-01-13 ENCOUNTER — Other Ambulatory Visit: Payer: Self-pay | Admitting: Obstetrics and Gynecology

## 2024-01-26 DIAGNOSIS — I5022 Chronic systolic (congestive) heart failure: Secondary | ICD-10-CM | POA: Diagnosis not present

## 2024-01-31 DIAGNOSIS — E1165 Type 2 diabetes mellitus with hyperglycemia: Secondary | ICD-10-CM | POA: Diagnosis not present

## 2024-02-01 DIAGNOSIS — Z96642 Presence of left artificial hip joint: Secondary | ICD-10-CM | POA: Diagnosis not present

## 2024-02-01 DIAGNOSIS — I130 Hypertensive heart and chronic kidney disease with heart failure and stage 1 through stage 4 chronic kidney disease, or unspecified chronic kidney disease: Secondary | ICD-10-CM | POA: Diagnosis not present

## 2024-02-01 DIAGNOSIS — F02B3 Dementia in other diseases classified elsewhere, moderate, with mood disturbance: Secondary | ICD-10-CM | POA: Diagnosis not present

## 2024-02-01 DIAGNOSIS — I951 Orthostatic hypotension: Secondary | ICD-10-CM | POA: Diagnosis not present

## 2024-02-01 DIAGNOSIS — Z860101 Personal history of adenomatous and serrated colon polyps: Secondary | ICD-10-CM | POA: Diagnosis not present

## 2024-02-01 DIAGNOSIS — F32A Depression, unspecified: Secondary | ICD-10-CM | POA: Diagnosis not present

## 2024-02-01 DIAGNOSIS — Z79899 Other long term (current) drug therapy: Secondary | ICD-10-CM | POA: Diagnosis not present

## 2024-02-01 DIAGNOSIS — F02B18 Dementia in other diseases classified elsewhere, moderate, with other behavioral disturbance: Secondary | ICD-10-CM | POA: Diagnosis not present

## 2024-02-01 DIAGNOSIS — M47814 Spondylosis without myelopathy or radiculopathy, thoracic region: Secondary | ICD-10-CM | POA: Diagnosis not present

## 2024-02-01 DIAGNOSIS — K5900 Constipation, unspecified: Secondary | ICD-10-CM | POA: Diagnosis not present

## 2024-02-01 DIAGNOSIS — N1330 Unspecified hydronephrosis: Secondary | ICD-10-CM | POA: Diagnosis not present

## 2024-02-01 DIAGNOSIS — Z794 Long term (current) use of insulin: Secondary | ICD-10-CM | POA: Diagnosis not present

## 2024-02-01 DIAGNOSIS — G479 Sleep disorder, unspecified: Secondary | ICD-10-CM | POA: Diagnosis not present

## 2024-02-01 DIAGNOSIS — Z7901 Long term (current) use of anticoagulants: Secondary | ICD-10-CM | POA: Diagnosis not present

## 2024-02-01 DIAGNOSIS — I480 Paroxysmal atrial fibrillation: Secondary | ICD-10-CM | POA: Diagnosis not present

## 2024-02-01 DIAGNOSIS — E7800 Pure hypercholesterolemia, unspecified: Secondary | ICD-10-CM | POA: Diagnosis not present

## 2024-02-01 DIAGNOSIS — I5022 Chronic systolic (congestive) heart failure: Secondary | ICD-10-CM | POA: Diagnosis not present

## 2024-02-01 DIAGNOSIS — E1122 Type 2 diabetes mellitus with diabetic chronic kidney disease: Secondary | ICD-10-CM | POA: Diagnosis not present

## 2024-02-01 DIAGNOSIS — H9193 Unspecified hearing loss, bilateral: Secondary | ICD-10-CM | POA: Diagnosis not present

## 2024-02-01 DIAGNOSIS — E1165 Type 2 diabetes mellitus with hyperglycemia: Secondary | ICD-10-CM | POA: Diagnosis not present

## 2024-02-01 DIAGNOSIS — Z87891 Personal history of nicotine dependence: Secondary | ICD-10-CM | POA: Diagnosis not present

## 2024-02-01 DIAGNOSIS — M1990 Unspecified osteoarthritis, unspecified site: Secondary | ICD-10-CM | POA: Diagnosis not present

## 2024-02-01 DIAGNOSIS — I429 Cardiomyopathy, unspecified: Secondary | ICD-10-CM | POA: Diagnosis not present

## 2024-02-01 DIAGNOSIS — N184 Chronic kidney disease, stage 4 (severe): Secondary | ICD-10-CM | POA: Diagnosis not present

## 2024-02-01 DIAGNOSIS — G309 Alzheimer's disease, unspecified: Secondary | ICD-10-CM | POA: Diagnosis not present

## 2024-02-08 DIAGNOSIS — H9193 Unspecified hearing loss, bilateral: Secondary | ICD-10-CM | POA: Diagnosis not present

## 2024-02-08 DIAGNOSIS — K5900 Constipation, unspecified: Secondary | ICD-10-CM | POA: Diagnosis not present

## 2024-02-08 DIAGNOSIS — Z79899 Other long term (current) drug therapy: Secondary | ICD-10-CM | POA: Diagnosis not present

## 2024-02-08 DIAGNOSIS — E1122 Type 2 diabetes mellitus with diabetic chronic kidney disease: Secondary | ICD-10-CM | POA: Diagnosis not present

## 2024-02-08 DIAGNOSIS — N1330 Unspecified hydronephrosis: Secondary | ICD-10-CM | POA: Diagnosis not present

## 2024-02-08 DIAGNOSIS — Z96642 Presence of left artificial hip joint: Secondary | ICD-10-CM | POA: Diagnosis not present

## 2024-02-08 DIAGNOSIS — F02B3 Dementia in other diseases classified elsewhere, moderate, with mood disturbance: Secondary | ICD-10-CM | POA: Diagnosis not present

## 2024-02-08 DIAGNOSIS — I480 Paroxysmal atrial fibrillation: Secondary | ICD-10-CM | POA: Diagnosis not present

## 2024-02-08 DIAGNOSIS — I951 Orthostatic hypotension: Secondary | ICD-10-CM | POA: Diagnosis not present

## 2024-02-08 DIAGNOSIS — Z794 Long term (current) use of insulin: Secondary | ICD-10-CM | POA: Diagnosis not present

## 2024-02-08 DIAGNOSIS — N184 Chronic kidney disease, stage 4 (severe): Secondary | ICD-10-CM | POA: Diagnosis not present

## 2024-02-08 DIAGNOSIS — Z7901 Long term (current) use of anticoagulants: Secondary | ICD-10-CM | POA: Diagnosis not present

## 2024-02-08 DIAGNOSIS — M1990 Unspecified osteoarthritis, unspecified site: Secondary | ICD-10-CM | POA: Diagnosis not present

## 2024-02-08 DIAGNOSIS — M47814 Spondylosis without myelopathy or radiculopathy, thoracic region: Secondary | ICD-10-CM | POA: Diagnosis not present

## 2024-02-08 DIAGNOSIS — Z860101 Personal history of adenomatous and serrated colon polyps: Secondary | ICD-10-CM | POA: Diagnosis not present

## 2024-02-08 DIAGNOSIS — E7800 Pure hypercholesterolemia, unspecified: Secondary | ICD-10-CM | POA: Diagnosis not present

## 2024-02-08 DIAGNOSIS — G309 Alzheimer's disease, unspecified: Secondary | ICD-10-CM | POA: Diagnosis not present

## 2024-02-08 DIAGNOSIS — I429 Cardiomyopathy, unspecified: Secondary | ICD-10-CM | POA: Diagnosis not present

## 2024-02-08 DIAGNOSIS — F02B18 Dementia in other diseases classified elsewhere, moderate, with other behavioral disturbance: Secondary | ICD-10-CM | POA: Diagnosis not present

## 2024-02-08 DIAGNOSIS — Z87891 Personal history of nicotine dependence: Secondary | ICD-10-CM | POA: Diagnosis not present

## 2024-02-08 DIAGNOSIS — F32A Depression, unspecified: Secondary | ICD-10-CM | POA: Diagnosis not present

## 2024-02-08 DIAGNOSIS — G479 Sleep disorder, unspecified: Secondary | ICD-10-CM | POA: Diagnosis not present

## 2024-02-08 DIAGNOSIS — I130 Hypertensive heart and chronic kidney disease with heart failure and stage 1 through stage 4 chronic kidney disease, or unspecified chronic kidney disease: Secondary | ICD-10-CM | POA: Diagnosis not present

## 2024-02-08 DIAGNOSIS — E1165 Type 2 diabetes mellitus with hyperglycemia: Secondary | ICD-10-CM | POA: Diagnosis not present

## 2024-02-08 DIAGNOSIS — I5022 Chronic systolic (congestive) heart failure: Secondary | ICD-10-CM | POA: Diagnosis not present

## 2024-02-13 DIAGNOSIS — Z23 Encounter for immunization: Secondary | ICD-10-CM | POA: Diagnosis not present

## 2024-02-13 DIAGNOSIS — L98419 Non-pressure chronic ulcer of buttock with unspecified severity: Secondary | ICD-10-CM | POA: Diagnosis not present

## 2024-02-13 DIAGNOSIS — E119 Type 2 diabetes mellitus without complications: Secondary | ICD-10-CM | POA: Diagnosis not present

## 2024-02-13 DIAGNOSIS — N183 Chronic kidney disease, stage 3 unspecified: Secondary | ICD-10-CM | POA: Diagnosis not present

## 2024-02-22 DIAGNOSIS — B351 Tinea unguium: Secondary | ICD-10-CM | POA: Diagnosis not present

## 2024-02-22 DIAGNOSIS — E1140 Type 2 diabetes mellitus with diabetic neuropathy, unspecified: Secondary | ICD-10-CM | POA: Diagnosis not present

## 2024-02-22 DIAGNOSIS — Z794 Long term (current) use of insulin: Secondary | ICD-10-CM | POA: Diagnosis not present

## 2024-02-22 DIAGNOSIS — L600 Ingrowing nail: Secondary | ICD-10-CM | POA: Diagnosis not present

## 2024-03-01 DIAGNOSIS — E1165 Type 2 diabetes mellitus with hyperglycemia: Secondary | ICD-10-CM | POA: Diagnosis not present

## 2024-03-29 ENCOUNTER — Other Ambulatory Visit: Payer: Self-pay | Admitting: Obstetrics and Gynecology

## 2024-10-25 ENCOUNTER — Ambulatory Visit: Admitting: Urology
